# Patient Record
Sex: Female | Born: 1955
Health system: Southern US, Community
[De-identification: ages and names within clinical notes are randomized; demographics above are authoritative.]

## PROBLEM LIST (undated history)

## (undated) DIAGNOSIS — F32A Depression, unspecified: Secondary | ICD-10-CM

## (undated) DIAGNOSIS — M545 Low back pain, unspecified: Secondary | ICD-10-CM

## (undated) DIAGNOSIS — G8929 Other chronic pain: Secondary | ICD-10-CM

## (undated) DIAGNOSIS — I10 Essential (primary) hypertension: Secondary | ICD-10-CM

## (undated) DIAGNOSIS — G608 Other hereditary and idiopathic neuropathies: Secondary | ICD-10-CM

## (undated) DIAGNOSIS — G629 Polyneuropathy, unspecified: Secondary | ICD-10-CM

## (undated) DIAGNOSIS — F419 Anxiety disorder, unspecified: Secondary | ICD-10-CM

## (undated) DIAGNOSIS — J449 Chronic obstructive pulmonary disease, unspecified: Secondary | ICD-10-CM

## (undated) DIAGNOSIS — F329 Major depressive disorder, single episode, unspecified: Secondary | ICD-10-CM

## (undated) DIAGNOSIS — F4001 Agoraphobia with panic disorder: Secondary | ICD-10-CM

## (undated) HISTORY — DX: Low back pain: M54.5

## (undated) HISTORY — DX: Major depressive disorder, single episode, unspecified: F32.9

## (undated) HISTORY — DX: Depression, unspecified: F32.A

## (undated) HISTORY — DX: Polyneuropathy, unspecified: G62.9

## (undated) HISTORY — PX: OTHER SURGICAL HISTORY: SHX169

## (undated) HISTORY — DX: Anxiety disorder, unspecified: F41.9

## (undated) HISTORY — DX: Essential (primary) hypertension: I10

## (undated) HISTORY — DX: Low back pain, unspecified: M54.50

## (undated) HISTORY — DX: Agoraphobia with panic disorder: F40.01

## (undated) HISTORY — DX: Chronic obstructive pulmonary disease, unspecified: J44.9

## (undated) HISTORY — DX: Other hereditary and idiopathic neuropathies: G60.8

## (undated) HISTORY — DX: Other chronic pain: G89.29

---

## 2007-02-14 ENCOUNTER — Emergency Department: Payer: Self-pay | Admitting: Emergency Medicine

## 2007-02-14 ENCOUNTER — Other Ambulatory Visit: Payer: Self-pay

## 2009-12-04 ENCOUNTER — Emergency Department: Payer: Self-pay | Admitting: Emergency Medicine

## 2011-06-07 ENCOUNTER — Emergency Department: Payer: Self-pay | Admitting: Emergency Medicine

## 2011-06-21 DIAGNOSIS — F329 Major depressive disorder, single episode, unspecified: Secondary | ICD-10-CM | POA: Insufficient documentation

## 2011-06-21 DIAGNOSIS — J449 Chronic obstructive pulmonary disease, unspecified: Secondary | ICD-10-CM | POA: Insufficient documentation

## 2011-10-04 ENCOUNTER — Emergency Department: Payer: Self-pay | Admitting: Emergency Medicine

## 2012-10-14 ENCOUNTER — Emergency Department: Payer: Self-pay | Admitting: Internal Medicine

## 2012-10-19 LAB — MAGNESIUM: Magnesium: 1.7 mg/dL — ABNORMAL LOW

## 2012-10-19 LAB — URINALYSIS, COMPLETE
Bacteria: NONE SEEN
Bilirubin,UR: NEGATIVE
Blood: NEGATIVE
Glucose,UR: NEGATIVE mg/dL (ref 0–75)
Ketone: NEGATIVE
Leukocyte Esterase: NEGATIVE
Nitrite: NEGATIVE
Protein: NEGATIVE
RBC,UR: <1 /HPF (ref 0–5)
Specific Gravity: 1.002 (ref 1.003–1.030)
Squamous Epithelial: <1

## 2012-10-19 LAB — COMPREHENSIVE METABOLIC PANEL
Albumin: 3.7 g/dL (ref 3.4–5.0)
Alkaline Phosphatase: 110 U/L (ref 50–136)
Anion Gap: 7 (ref 7–16)
BUN: 5 mg/dL — ABNORMAL LOW (ref 7–18)
Chloride: 99 mmol/L (ref 98–107)
Co2: 28 mmol/L (ref 21–32)
EGFR (African American): >60
EGFR (Non-African Amer.): >60
Glucose: 97 mg/dL (ref 65–99)
Osmolality: 265 (ref 275–301)
SGOT(AST): 39 U/L — ABNORMAL HIGH (ref 15–37)
SGPT (ALT): 34 U/L (ref 12–78)

## 2012-10-19 LAB — TROPONIN I: Troponin-I: <0.02 ng/mL

## 2012-10-19 LAB — CK TOTAL AND CKMB (NOT AT ARMC): CK-MB: 5.2 ng/mL — ABNORMAL HIGH (ref 0.5–3.6)

## 2012-10-20 ENCOUNTER — Inpatient Hospital Stay: Payer: Self-pay | Admitting: Internal Medicine

## 2012-10-20 LAB — BASIC METABOLIC PANEL
Anion Gap: 9 (ref 7–16)
Anion Gap: 6 — ABNORMAL LOW (ref 7–16)
BUN: 3 mg/dL — ABNORMAL LOW (ref 7–18)
BUN: 7 mg/dL (ref 7–18)
Calcium, Total: 8 mg/dL — ABNORMAL LOW (ref 8.5–10.1)
Chloride: 105 mmol/L (ref 98–107)
Chloride: 104 mmol/L (ref 98–107)
Co2: 26 mmol/L (ref 21–32)
Creatinine: 0.76 mg/dL (ref 0.60–1.30)
Creatinine: 0.9 mg/dL (ref 0.60–1.30)
EGFR (African American): >60
EGFR (Non-African Amer.): >60
Glucose: 93 mg/dL (ref 65–99)
Glucose: 114 mg/dL — ABNORMAL HIGH (ref 65–99)
Glucose: 114 mg/dL — ABNORMAL HIGH (ref 65–99)
Osmolality: 273 (ref 275–301)
Osmolality: 273 (ref 275–301)
Potassium: 2.5 mmol/L — CL (ref 3.5–5.1)
Potassium: 3 mmol/L — ABNORMAL LOW (ref 3.5–5.1)
Potassium: 3.6 mmol/L (ref 3.5–5.1)
Sodium: 138 mmol/L (ref 136–145)

## 2012-10-20 LAB — MAGNESIUM: Magnesium: 2.3 mg/dL

## 2012-10-20 LAB — CBC WITH DIFFERENTIAL/PLATELET
Eosinophil #: 0.2 10*3/uL (ref 0.0–0.7)
HGB: 11.6 g/dL — ABNORMAL LOW (ref 12.0–16.0)
Lymphocyte %: 43.1 %
MCHC: 35.8 g/dL (ref 32.0–36.0)
MCV: 100 fL (ref 80–100)
Monocyte #: 0.8 x10 3/mm (ref 0.2–0.9)
Monocyte %: 10.3 %
Neutrophil #: 3.3 10*3/uL (ref 1.4–6.5)
Platelet: 384 10*3/uL (ref 150–440)
RBC: 3.25 10*6/uL — ABNORMAL LOW (ref 3.80–5.20)
WBC: 7.5 10*3/uL (ref 3.6–11.0)

## 2012-10-20 LAB — CK-MB
CK-MB: 2.7 ng/mL (ref 0.5–3.6)
CK-MB: 2.1 ng/mL (ref 0.5–3.6)

## 2012-10-20 LAB — TROPONIN I: Troponin-I: <0.02 ng/mL

## 2012-10-22 ENCOUNTER — Emergency Department: Payer: Self-pay | Admitting: Emergency Medicine

## 2012-10-22 LAB — BASIC METABOLIC PANEL
Anion Gap: 9 (ref 7–16)
BUN: 3 mg/dL — ABNORMAL LOW (ref 7–18)
Calcium, Total: 8.6 mg/dL (ref 8.5–10.1)
Chloride: 102 mmol/L (ref 98–107)
Creatinine: 0.7 mg/dL (ref 0.60–1.30)
EGFR (African American): >60
EGFR (Non-African Amer.): >60
Glucose: 94 mg/dL (ref 65–99)
Osmolality: 266 (ref 275–301)
Sodium: 135 mmol/L — ABNORMAL LOW (ref 136–145)

## 2012-10-22 LAB — CBC
HCT: 29.8 % — ABNORMAL LOW (ref 35.0–47.0)
MCH: 36.2 pg — ABNORMAL HIGH (ref 26.0–34.0)
MCV: 103 fL — ABNORMAL HIGH (ref 80–100)
Platelet: 302 10*3/uL (ref 150–440)
RDW: 15 % — ABNORMAL HIGH (ref 11.5–14.5)
WBC: 8.3 10*3/uL (ref 3.6–11.0)

## 2012-11-21 LAB — HM COLONOSCOPY

## 2012-11-21 LAB — HM MAMMOGRAPHY

## 2012-11-21 LAB — HM PAP SMEAR

## 2012-12-11 LAB — BASIC METABOLIC PANEL
Anion Gap: 7 (ref 7–16)
Calcium, Total: 8.4 mg/dL — ABNORMAL LOW (ref 8.5–10.1)
Chloride: 93 mmol/L — ABNORMAL LOW (ref 98–107)
Glucose: 100 mg/dL — ABNORMAL HIGH (ref 65–99)
Potassium: 2.5 mmol/L — CL (ref 3.5–5.1)

## 2012-12-11 LAB — MAGNESIUM: Magnesium: 1.3 mg/dL — ABNORMAL LOW

## 2012-12-11 LAB — CBC
MCH: 34.7 pg — ABNORMAL HIGH (ref 26.0–34.0)
MCHC: 36.3 g/dL — ABNORMAL HIGH (ref 32.0–36.0)
Platelet: 338 10*3/uL (ref 150–440)
RBC: 3.2 10*6/uL — ABNORMAL LOW (ref 3.80–5.20)
RDW: 13.1 % (ref 11.5–14.5)

## 2012-12-12 ENCOUNTER — Observation Stay: Payer: Self-pay | Admitting: Internal Medicine

## 2012-12-12 LAB — SODIUM: Sodium: 134 mmol/L — ABNORMAL LOW (ref 136–145)

## 2012-12-12 LAB — POTASSIUM: Potassium: 3.9 mmol/L (ref 3.5–5.1)

## 2012-12-12 LAB — MAGNESIUM: Magnesium: 2.5 mg/dL — ABNORMAL HIGH

## 2013-02-19 ENCOUNTER — Emergency Department: Payer: Self-pay | Admitting: Emergency Medicine

## 2013-03-11 LAB — HEMOGLOBIN A1C: Hgb A1c MFr Bld: 5.8 % (ref 4.0–6.0)

## 2013-06-03 ENCOUNTER — Emergency Department: Payer: Self-pay | Admitting: Emergency Medicine

## 2013-06-03 LAB — URINALYSIS, COMPLETE
BACTERIA: NONE SEEN
BILIRUBIN, UR: NEGATIVE
Glucose,UR: NEGATIVE mg/dL (ref 0–75)
Ketone: NEGATIVE
Leukocyte Esterase: NEGATIVE
NITRITE: NEGATIVE
Ph: 7 (ref 4.5–8.0)
Protein: NEGATIVE
RBC,UR: NONE SEEN /HPF (ref 0–5)
Specific Gravity: 1.003 (ref 1.003–1.030)
Squamous Epithelial: <1

## 2013-10-19 ENCOUNTER — Emergency Department: Payer: Self-pay | Admitting: Internal Medicine

## 2013-10-20 ENCOUNTER — Emergency Department: Payer: Self-pay | Admitting: Emergency Medicine

## 2013-11-18 ENCOUNTER — Ambulatory Visit: Payer: Self-pay | Admitting: Family Medicine

## 2013-12-10 LAB — LIPID PANEL
CHOLESTEROL: 253 mg/dL — AB (ref 0–200)
HDL: 62 mg/dL (ref 35–70)
Triglycerides: 147 mg/dL (ref 40–160)

## 2013-12-17 DIAGNOSIS — M545 Low back pain, unspecified: Secondary | ICD-10-CM | POA: Insufficient documentation

## 2013-12-17 DIAGNOSIS — R200 Anesthesia of skin: Secondary | ICD-10-CM | POA: Insufficient documentation

## 2013-12-17 DIAGNOSIS — G8929 Other chronic pain: Secondary | ICD-10-CM | POA: Insufficient documentation

## 2013-12-17 DIAGNOSIS — R202 Paresthesia of skin: Secondary | ICD-10-CM

## 2013-12-17 DIAGNOSIS — M79673 Pain in unspecified foot: Secondary | ICD-10-CM | POA: Insufficient documentation

## 2013-12-17 DIAGNOSIS — R208 Other disturbances of skin sensation: Secondary | ICD-10-CM | POA: Insufficient documentation

## 2013-12-30 LAB — LIPID PANEL: LDL Cholesterol: 162 mg/dL

## 2014-01-06 ENCOUNTER — Ambulatory Visit: Payer: Self-pay | Admitting: Neurology

## 2014-01-20 DIAGNOSIS — F4001 Agoraphobia with panic disorder: Secondary | ICD-10-CM | POA: Diagnosis not present

## 2014-01-20 DIAGNOSIS — E871 Hypo-osmolality and hyponatremia: Secondary | ICD-10-CM | POA: Diagnosis not present

## 2014-01-20 DIAGNOSIS — M479 Spondylosis, unspecified: Secondary | ICD-10-CM | POA: Diagnosis not present

## 2014-01-20 DIAGNOSIS — E876 Hypokalemia: Secondary | ICD-10-CM | POA: Diagnosis not present

## 2014-01-30 DIAGNOSIS — F17200 Nicotine dependence, unspecified, uncomplicated: Secondary | ICD-10-CM | POA: Diagnosis not present

## 2014-01-30 DIAGNOSIS — H7202 Central perforation of tympanic membrane, left ear: Secondary | ICD-10-CM | POA: Diagnosis not present

## 2014-01-30 DIAGNOSIS — H902 Conductive hearing loss, unspecified: Secondary | ICD-10-CM | POA: Diagnosis not present

## 2014-02-07 ENCOUNTER — Emergency Department: Payer: Self-pay | Admitting: Emergency Medicine

## 2014-02-07 DIAGNOSIS — G5792 Unspecified mononeuropathy of left lower limb: Secondary | ICD-10-CM | POA: Diagnosis not present

## 2014-02-07 DIAGNOSIS — I6789 Other cerebrovascular disease: Secondary | ICD-10-CM | POA: Diagnosis not present

## 2014-02-07 DIAGNOSIS — R200 Anesthesia of skin: Secondary | ICD-10-CM | POA: Diagnosis not present

## 2014-02-07 DIAGNOSIS — Z720 Tobacco use: Secondary | ICD-10-CM | POA: Diagnosis not present

## 2014-02-07 DIAGNOSIS — F419 Anxiety disorder, unspecified: Secondary | ICD-10-CM | POA: Diagnosis not present

## 2014-02-07 DIAGNOSIS — Z880 Allergy status to penicillin: Secondary | ICD-10-CM | POA: Diagnosis not present

## 2014-02-07 DIAGNOSIS — R202 Paresthesia of skin: Secondary | ICD-10-CM | POA: Diagnosis not present

## 2014-02-07 DIAGNOSIS — R209 Unspecified disturbances of skin sensation: Secondary | ICD-10-CM | POA: Diagnosis not present

## 2014-02-08 ENCOUNTER — Emergency Department: Payer: Self-pay | Admitting: Emergency Medicine

## 2014-02-08 DIAGNOSIS — Z720 Tobacco use: Secondary | ICD-10-CM | POA: Diagnosis not present

## 2014-02-08 DIAGNOSIS — R55 Syncope and collapse: Secondary | ICD-10-CM | POA: Diagnosis not present

## 2014-02-08 DIAGNOSIS — Z880 Allergy status to penicillin: Secondary | ICD-10-CM | POA: Diagnosis not present

## 2014-02-08 DIAGNOSIS — R1110 Vomiting, unspecified: Secondary | ICD-10-CM | POA: Diagnosis not present

## 2014-02-08 DIAGNOSIS — R110 Nausea: Secondary | ICD-10-CM | POA: Diagnosis not present

## 2014-02-08 DIAGNOSIS — F419 Anxiety disorder, unspecified: Secondary | ICD-10-CM | POA: Diagnosis not present

## 2014-02-08 DIAGNOSIS — R112 Nausea with vomiting, unspecified: Secondary | ICD-10-CM | POA: Diagnosis not present

## 2014-02-08 LAB — URINALYSIS, COMPLETE
BACTERIA: NONE SEEN
BILIRUBIN, UR: NEGATIVE
BLOOD: NEGATIVE
GLUCOSE, UR: NEGATIVE mg/dL (ref 0–75)
KETONE: NEGATIVE
Leukocyte Esterase: NEGATIVE
Nitrite: NEGATIVE
Ph: 7 (ref 4.5–8.0)
Protein: NEGATIVE
RBC,UR: 1 /HPF (ref 0–5)
SPECIFIC GRAVITY: 1.002 (ref 1.003–1.030)
WBC UR: 2 /HPF (ref 0–5)

## 2014-02-08 LAB — CBC
HCT: 36.7 % (ref 35.0–47.0)
HGB: 12.9 g/dL (ref 12.0–16.0)
MCH: 32.2 pg (ref 26.0–34.0)
MCHC: 35.1 g/dL (ref 32.0–36.0)
MCV: 92 fL (ref 80–100)
PLATELETS: 398 10*3/uL (ref 150–440)
RBC: 4 10*6/uL (ref 3.80–5.20)
RDW: 12.7 % (ref 11.5–14.5)
WBC: 10.8 10*3/uL (ref 3.6–11.0)

## 2014-02-08 LAB — COMPREHENSIVE METABOLIC PANEL
Albumin: 3.7 g/dL (ref 3.4–5.0)
Alkaline Phosphatase: 134 U/L — ABNORMAL HIGH (ref 46–116)
Anion Gap: 7 (ref 7–16)
BILIRUBIN TOTAL: 0.3 mg/dL (ref 0.2–1.0)
BUN: 3 mg/dL — ABNORMAL LOW (ref 7–18)
CHLORIDE: 103 mmol/L (ref 98–107)
CO2: 26 mmol/L (ref 21–32)
CREATININE: 0.78 mg/dL (ref 0.60–1.30)
Calcium, Total: 8.9 mg/dL (ref 8.5–10.1)
Glucose: 79 mg/dL (ref 65–99)
Osmolality: 267 (ref 275–301)
Potassium: 3.7 mmol/L (ref 3.5–5.1)
SGOT(AST): 23 U/L (ref 15–37)
SGPT (ALT): 20 U/L (ref 14–63)
Sodium: 136 mmol/L (ref 136–145)
Total Protein: 7.6 g/dL (ref 6.4–8.2)

## 2014-02-08 LAB — CK TOTAL AND CKMB (NOT AT ARMC)
CK, TOTAL: 121 U/L (ref 26–192)
CK-MB: 1 ng/mL (ref 0.5–3.6)

## 2014-02-08 LAB — TROPONIN I

## 2014-02-08 LAB — DRUG SCREEN, URINE
AMPHETAMINES, UR SCREEN: NEGATIVE (ref ?–1000)
BENZODIAZEPINE, UR SCRN: POSITIVE (ref ?–200)
Barbiturates, Ur Screen: NEGATIVE (ref ?–200)
Cannabinoid 50 Ng, Ur ~~LOC~~: NEGATIVE (ref ?–50)
Cocaine Metabolite,Ur ~~LOC~~: NEGATIVE (ref ?–300)
MDMA (ECSTASY) UR SCREEN: NEGATIVE (ref ?–500)
Methadone, Ur Screen: NEGATIVE (ref ?–300)
OPIATE, UR SCREEN: POSITIVE (ref ?–300)
PHENCYCLIDINE (PCP) UR S: NEGATIVE (ref ?–25)
Tricyclic, Ur Screen: NEGATIVE (ref ?–1000)

## 2014-02-08 LAB — TSH: Thyroid Stimulating Horm: 1.61 u[IU]/mL

## 2014-02-12 DIAGNOSIS — F4001 Agoraphobia with panic disorder: Secondary | ICD-10-CM | POA: Diagnosis not present

## 2014-02-18 DIAGNOSIS — G629 Polyneuropathy, unspecified: Secondary | ICD-10-CM | POA: Diagnosis not present

## 2014-02-18 DIAGNOSIS — F4001 Agoraphobia with panic disorder: Secondary | ICD-10-CM | POA: Diagnosis not present

## 2014-02-18 DIAGNOSIS — M5410 Radiculopathy, site unspecified: Secondary | ICD-10-CM | POA: Diagnosis not present

## 2014-02-18 DIAGNOSIS — G8929 Other chronic pain: Secondary | ICD-10-CM | POA: Diagnosis not present

## 2014-03-18 DIAGNOSIS — M5410 Radiculopathy, site unspecified: Secondary | ICD-10-CM | POA: Diagnosis not present

## 2014-03-18 DIAGNOSIS — F4001 Agoraphobia with panic disorder: Secondary | ICD-10-CM | POA: Diagnosis not present

## 2014-03-18 DIAGNOSIS — G629 Polyneuropathy, unspecified: Secondary | ICD-10-CM | POA: Diagnosis not present

## 2014-03-18 DIAGNOSIS — G8929 Other chronic pain: Secondary | ICD-10-CM | POA: Diagnosis not present

## 2014-03-18 DIAGNOSIS — G4701 Insomnia due to medical condition: Secondary | ICD-10-CM | POA: Diagnosis not present

## 2014-03-22 ENCOUNTER — Emergency Department: Payer: Self-pay | Admitting: Emergency Medicine

## 2014-03-22 DIAGNOSIS — R202 Paresthesia of skin: Secondary | ICD-10-CM | POA: Diagnosis not present

## 2014-03-22 DIAGNOSIS — R0602 Shortness of breath: Secondary | ICD-10-CM | POA: Diagnosis not present

## 2014-03-22 DIAGNOSIS — Z720 Tobacco use: Secondary | ICD-10-CM | POA: Diagnosis not present

## 2014-03-22 DIAGNOSIS — R079 Chest pain, unspecified: Secondary | ICD-10-CM | POA: Diagnosis not present

## 2014-03-22 DIAGNOSIS — Z880 Allergy status to penicillin: Secondary | ICD-10-CM | POA: Diagnosis not present

## 2014-03-22 DIAGNOSIS — R209 Unspecified disturbances of skin sensation: Secondary | ICD-10-CM | POA: Diagnosis not present

## 2014-03-22 DIAGNOSIS — F419 Anxiety disorder, unspecified: Secondary | ICD-10-CM | POA: Diagnosis not present

## 2014-03-22 DIAGNOSIS — I10 Essential (primary) hypertension: Secondary | ICD-10-CM | POA: Diagnosis not present

## 2014-03-22 DIAGNOSIS — M25473 Effusion, unspecified ankle: Secondary | ICD-10-CM | POA: Diagnosis not present

## 2014-03-22 DIAGNOSIS — R05 Cough: Secondary | ICD-10-CM | POA: Diagnosis not present

## 2014-03-22 DIAGNOSIS — M549 Dorsalgia, unspecified: Secondary | ICD-10-CM | POA: Diagnosis not present

## 2014-03-22 DIAGNOSIS — G629 Polyneuropathy, unspecified: Secondary | ICD-10-CM | POA: Diagnosis not present

## 2014-03-23 ENCOUNTER — Emergency Department: Payer: Self-pay | Admitting: Internal Medicine

## 2014-03-23 DIAGNOSIS — R209 Unspecified disturbances of skin sensation: Secondary | ICD-10-CM | POA: Diagnosis not present

## 2014-03-28 DIAGNOSIS — M5410 Radiculopathy, site unspecified: Secondary | ICD-10-CM | POA: Diagnosis not present

## 2014-03-28 DIAGNOSIS — G8929 Other chronic pain: Secondary | ICD-10-CM | POA: Diagnosis not present

## 2014-04-05 ENCOUNTER — Emergency Department: Payer: Self-pay | Admitting: Emergency Medicine

## 2014-04-05 DIAGNOSIS — Z720 Tobacco use: Secondary | ICD-10-CM | POA: Diagnosis not present

## 2014-04-05 DIAGNOSIS — Z880 Allergy status to penicillin: Secondary | ICD-10-CM | POA: Diagnosis not present

## 2014-04-05 DIAGNOSIS — R062 Wheezing: Secondary | ICD-10-CM | POA: Diagnosis not present

## 2014-04-05 DIAGNOSIS — F23 Brief psychotic disorder: Secondary | ICD-10-CM | POA: Diagnosis not present

## 2014-04-05 DIAGNOSIS — F29 Unspecified psychosis not due to a substance or known physiological condition: Secondary | ICD-10-CM | POA: Diagnosis not present

## 2014-04-05 DIAGNOSIS — F329 Major depressive disorder, single episode, unspecified: Secondary | ICD-10-CM | POA: Diagnosis not present

## 2014-04-05 DIAGNOSIS — F419 Anxiety disorder, unspecified: Secondary | ICD-10-CM | POA: Diagnosis not present

## 2014-04-05 LAB — ETHANOL: Ethanol: <5 mg/dL

## 2014-04-05 LAB — URINALYSIS, COMPLETE
BILIRUBIN, UR: NEGATIVE
Blood: NEGATIVE
GLUCOSE, UR: NEGATIVE mg/dL (ref 0–75)
Ketone: NEGATIVE
Nitrite: NEGATIVE
Ph: 7 (ref 4.5–8.0)
Protein: NEGATIVE
RBC,UR: 1 /HPF (ref 0–5)
SPECIFIC GRAVITY: 1.004 (ref 1.003–1.030)
Squamous Epithelial: <1

## 2014-04-05 LAB — COMPREHENSIVE METABOLIC PANEL
ALT: 16 U/L
AST: 19 U/L
Albumin: 4.7 g/dL
Alkaline Phosphatase: 132 U/L — ABNORMAL HIGH
Anion Gap: 8 (ref 7–16)
BUN: <5 mg/dL
Bilirubin,Total: 0.3 mg/dL
CALCIUM: 9.1 mg/dL
CO2: 28 mmol/L
Chloride: 97 mmol/L — ABNORMAL LOW
Creatinine: 0.57 mg/dL
EGFR (African American): >60
EGFR (Non-African Amer.): >60
Glucose: 85 mg/dL
Potassium: 3.1 mmol/L — ABNORMAL LOW
Sodium: 133 mmol/L — ABNORMAL LOW
Total Protein: 8.2 g/dL — ABNORMAL HIGH

## 2014-04-05 LAB — CBC
HCT: 41.9 % (ref 35.0–47.0)
HGB: 14.2 g/dL (ref 12.0–16.0)
MCH: 31 pg (ref 26.0–34.0)
MCHC: 33.9 g/dL (ref 32.0–36.0)
MCV: 92 fL (ref 80–100)
Platelet: 415 10*3/uL (ref 150–440)
RBC: 4.58 10*6/uL (ref 3.80–5.20)
RDW: 13.2 % (ref 11.5–14.5)
WBC: 14.4 10*3/uL — AB (ref 3.6–11.0)

## 2014-04-05 LAB — DRUG SCREEN, URINE
Amphetamines, Ur Screen: NEGATIVE
BARBITURATES, UR SCREEN: NEGATIVE
BENZODIAZEPINE, UR SCRN: POSITIVE
CANNABINOID 50 NG, UR ~~LOC~~: NEGATIVE
Cocaine Metabolite,Ur ~~LOC~~: NEGATIVE
MDMA (ECSTASY) UR SCREEN: NEGATIVE
METHADONE, UR SCREEN: NEGATIVE
Opiate, Ur Screen: POSITIVE
PHENCYCLIDINE (PCP) UR S: NEGATIVE
Tricyclic, Ur Screen: NEGATIVE

## 2014-04-11 ENCOUNTER — Inpatient Hospital Stay
Admit: 2014-04-11 | Disposition: A | Discharge status: Home / Self Care | Payer: Self-pay | Attending: Internal Medicine | Admitting: Internal Medicine

## 2014-04-11 DIAGNOSIS — M1990 Unspecified osteoarthritis, unspecified site: Secondary | ICD-10-CM | POA: Diagnosis not present

## 2014-04-11 DIAGNOSIS — G629 Polyneuropathy, unspecified: Secondary | ICD-10-CM | POA: Diagnosis not present

## 2014-04-11 DIAGNOSIS — R4189 Other symptoms and signs involving cognitive functions and awareness: Secondary | ICD-10-CM | POA: Diagnosis not present

## 2014-04-11 DIAGNOSIS — A419 Sepsis, unspecified organism: Secondary | ICD-10-CM | POA: Diagnosis not present

## 2014-04-11 DIAGNOSIS — J432 Centrilobular emphysema: Secondary | ICD-10-CM | POA: Diagnosis not present

## 2014-04-11 DIAGNOSIS — J449 Chronic obstructive pulmonary disease, unspecified: Secondary | ICD-10-CM | POA: Diagnosis not present

## 2014-04-11 DIAGNOSIS — Z880 Allergy status to penicillin: Secondary | ICD-10-CM | POA: Diagnosis not present

## 2014-04-11 DIAGNOSIS — R4182 Altered mental status, unspecified: Secondary | ICD-10-CM | POA: Diagnosis not present

## 2014-04-11 DIAGNOSIS — I10 Essential (primary) hypertension: Secondary | ICD-10-CM | POA: Diagnosis not present

## 2014-04-11 DIAGNOSIS — T542X2A Toxic effect of corrosive acids and acid-like substances, intentional self-harm, initial encounter: Secondary | ICD-10-CM | POA: Diagnosis not present

## 2014-04-11 DIAGNOSIS — F410 Panic disorder [episodic paroxysmal anxiety] without agoraphobia: Secondary | ICD-10-CM | POA: Diagnosis not present

## 2014-04-11 DIAGNOSIS — F339 Major depressive disorder, recurrent, unspecified: Secondary | ICD-10-CM | POA: Diagnosis not present

## 2014-04-11 DIAGNOSIS — G8929 Other chronic pain: Secondary | ICD-10-CM | POA: Diagnosis not present

## 2014-04-11 DIAGNOSIS — R4020 Unspecified coma: Secondary | ICD-10-CM | POA: Diagnosis not present

## 2014-04-11 DIAGNOSIS — R6521 Severe sepsis with septic shock: Secondary | ICD-10-CM | POA: Diagnosis not present

## 2014-04-11 DIAGNOSIS — F319 Bipolar disorder, unspecified: Secondary | ICD-10-CM | POA: Diagnosis not present

## 2014-04-11 DIAGNOSIS — T424X2A Poisoning by benzodiazepines, intentional self-harm, initial encounter: Secondary | ICD-10-CM | POA: Diagnosis not present

## 2014-04-11 DIAGNOSIS — G9341 Metabolic encephalopathy: Secondary | ICD-10-CM | POA: Diagnosis not present

## 2014-04-11 DIAGNOSIS — F209 Schizophrenia, unspecified: Secondary | ICD-10-CM | POA: Diagnosis not present

## 2014-04-11 DIAGNOSIS — I739 Peripheral vascular disease, unspecified: Secondary | ICD-10-CM | POA: Diagnosis not present

## 2014-04-11 DIAGNOSIS — Z79899 Other long term (current) drug therapy: Secondary | ICD-10-CM | POA: Diagnosis not present

## 2014-04-11 DIAGNOSIS — M549 Dorsalgia, unspecified: Secondary | ICD-10-CM | POA: Diagnosis not present

## 2014-04-11 DIAGNOSIS — F329 Major depressive disorder, single episode, unspecified: Secondary | ICD-10-CM | POA: Diagnosis not present

## 2014-04-11 DIAGNOSIS — F4000 Agoraphobia, unspecified: Secondary | ICD-10-CM | POA: Diagnosis not present

## 2014-04-11 DIAGNOSIS — R000 Tachycardia, unspecified: Secondary | ICD-10-CM | POA: Diagnosis not present

## 2014-04-11 DIAGNOSIS — T50904A Poisoning by unspecified drugs, medicaments and biological substances, undetermined, initial encounter: Secondary | ICD-10-CM | POA: Diagnosis not present

## 2014-04-11 DIAGNOSIS — K219 Gastro-esophageal reflux disease without esophagitis: Secondary | ICD-10-CM | POA: Diagnosis not present

## 2014-04-11 DIAGNOSIS — I959 Hypotension, unspecified: Secondary | ICD-10-CM | POA: Diagnosis not present

## 2014-04-11 LAB — DRUG SCREEN, URINE
Amphetamines, Ur Screen: NEGATIVE
BARBITURATES, UR SCREEN: NEGATIVE
BENZODIAZEPINE, UR SCRN: POSITIVE
Cannabinoid 50 Ng, Ur ~~LOC~~: NEGATIVE
Cocaine Metabolite,Ur ~~LOC~~: NEGATIVE
MDMA (Ecstasy)Ur Screen: NEGATIVE
Methadone, Ur Screen: NEGATIVE
Opiate, Ur Screen: POSITIVE
PHENCYCLIDINE (PCP) UR S: NEGATIVE
Tricyclic, Ur Screen: POSITIVE

## 2014-04-11 LAB — ETHANOL
Ethanol: <5 mg/dL
Ethanol: <5 mg/dL

## 2014-04-11 LAB — CBC
HCT: 43.3 % (ref 35.0–47.0)
HGB: 14.2 g/dL (ref 12.0–16.0)
MCH: 29.7 pg (ref 26.0–34.0)
MCHC: 32.8 g/dL (ref 32.0–36.0)
MCV: 90 fL (ref 80–100)
Platelet: 412 10*3/uL (ref 150–440)
RBC: 4.79 10*6/uL (ref 3.80–5.20)
RDW: 13 % (ref 11.5–14.5)
WBC: 12 10*3/uL — AB (ref 3.6–11.0)

## 2014-04-11 LAB — COMPREHENSIVE METABOLIC PANEL
ALBUMIN: 4.6 g/dL
ALT: 18 U/L
Alkaline Phosphatase: 118 U/L
Anion Gap: 9 (ref 7–16)
BILIRUBIN TOTAL: 0.7 mg/dL
BUN: 8 mg/dL
CHLORIDE: 105 mmol/L
CREATININE: 0.74 mg/dL
Calcium, Total: 9.2 mg/dL
Co2: 24 mmol/L
EGFR (African American): >60
EGFR (Non-African Amer.): >60
GLUCOSE: 145 mg/dL — AB
POTASSIUM: 3.5 mmol/L
SGOT(AST): 22 U/L
SODIUM: 138 mmol/L
Total Protein: 7.8 g/dL

## 2014-04-11 LAB — CK-MB
CK-MB: 2.3 ng/mL
CK-MB: 2.5 ng/mL

## 2014-04-11 LAB — URINALYSIS, COMPLETE
BILIRUBIN, UR: NEGATIVE
Bacteria: NONE SEEN
Blood: NEGATIVE
Glucose,UR: NEGATIVE mg/dL (ref 0–75)
Ketone: NEGATIVE
Leukocyte Esterase: NEGATIVE
Nitrite: NEGATIVE
Ph: 7 (ref 4.5–8.0)
Protein: NEGATIVE
Specific Gravity: 1.01 (ref 1.003–1.030)
WBC UR: 1 /HPF (ref 0–5)

## 2014-04-11 LAB — LACTIC ACID, PLASMA
Lactic Acid, Venous: 2.4 mmol/L
Lactic Acid, Venous: 0.9 mmol/L

## 2014-04-11 LAB — TROPONIN I
Troponin-I: <0.03 ng/mL
Troponin-I: <0.03 ng/mL

## 2014-04-11 LAB — AMMONIA: AMMONIA, PLASMA: 51 umol/L — AB

## 2014-04-11 LAB — PROTIME-INR
INR: 0.9
PROTHROMBIN TIME: 12.5 s

## 2014-04-11 LAB — ACETAMINOPHEN LEVEL: Acetaminophen: <10 ug/mL

## 2014-04-11 LAB — SALICYLATE LEVEL: Salicylates, Serum: <4 mg/dL

## 2014-04-12 LAB — BASIC METABOLIC PANEL
ANION GAP: 3 — AB (ref 7–16)
BUN: <5 mg/dL
CALCIUM: 7.6 mg/dL — AB
CHLORIDE: 109 mmol/L
Co2: 24 mmol/L
Creatinine: 0.52 mg/dL
EGFR (Non-African Amer.): >60
GLUCOSE: 84 mg/dL
Potassium: 3.4 mmol/L — ABNORMAL LOW
SODIUM: 136 mmol/L

## 2014-04-12 LAB — COMPREHENSIVE METABOLIC PANEL
Albumin: 3 g/dL — ABNORMAL LOW
Alkaline Phosphatase: 79 U/L
Anion Gap: 6 — ABNORMAL LOW (ref 7–16)
Bilirubin,Total: 0.5 mg/dL
Calcium, Total: 7.8 mg/dL — ABNORMAL LOW
Chloride: 108 mmol/L
Co2: 22 mmol/L
Creatinine: 0.54 mg/dL
EGFR (African American): >60
Glucose: 105 mg/dL — ABNORMAL HIGH
Potassium: 3.4 mmol/L — ABNORMAL LOW
SGOT(AST): 24 U/L
SGPT (ALT): 12 U/L — ABNORMAL LOW
Sodium: 136 mmol/L
Total Protein: 5.6 g/dL — ABNORMAL LOW

## 2014-04-12 LAB — TSH: Thyroid Stimulating Horm: 0.307 u[IU]/mL — ABNORMAL LOW

## 2014-04-12 LAB — CBC WITH DIFFERENTIAL/PLATELET
BASOS ABS: 0.1 10*3/uL (ref 0.0–0.1)
Basophil %: 0.6 %
Eosinophil #: 0 10*3/uL (ref 0.0–0.7)
Eosinophil %: 0.4 %
HCT: 33.8 % — ABNORMAL LOW (ref 35.0–47.0)
HGB: 11.3 g/dL — ABNORMAL LOW (ref 12.0–16.0)
LYMPHS ABS: 3.2 10*3/uL (ref 1.0–3.6)
Lymphocyte %: 31.2 %
MCH: 30.4 pg (ref 26.0–34.0)
MCHC: 33.3 g/dL (ref 32.0–36.0)
MCV: 91 fL (ref 80–100)
Monocyte #: 0.5 x10 3/mm (ref 0.2–0.9)
Monocyte %: 5.2 %
NEUTROS ABS: 6.3 10*3/uL (ref 1.4–6.5)
Neutrophil %: 62.6 %
Platelet: 288 10*3/uL (ref 150–440)
RBC: 3.71 10*6/uL — AB (ref 3.80–5.20)
RDW: 13.3 % (ref 11.5–14.5)
WBC: 10.1 10*3/uL (ref 3.6–11.0)

## 2014-04-12 LAB — CK-MB: CK-MB: 3.6 ng/mL

## 2014-04-12 LAB — D-DIMER(ARMC): D-Dimer: 492 ng/ml

## 2014-04-12 LAB — MAGNESIUM: Magnesium: 1.4 mg/dL — ABNORMAL LOW

## 2014-04-13 LAB — URINE CULTURE

## 2014-04-14 LAB — TSH: THYROID STIMULATING HORM: 0.696 u[IU]/mL

## 2014-04-16 LAB — CULTURE, BLOOD (SINGLE)

## 2014-04-17 DIAGNOSIS — E876 Hypokalemia: Secondary | ICD-10-CM | POA: Diagnosis not present

## 2014-04-17 DIAGNOSIS — G8929 Other chronic pain: Secondary | ICD-10-CM | POA: Diagnosis not present

## 2014-04-17 DIAGNOSIS — M5416 Radiculopathy, lumbar region: Secondary | ICD-10-CM | POA: Diagnosis not present

## 2014-04-17 DIAGNOSIS — F4001 Agoraphobia with panic disorder: Secondary | ICD-10-CM | POA: Diagnosis not present

## 2014-04-17 DIAGNOSIS — I10 Essential (primary) hypertension: Secondary | ICD-10-CM | POA: Diagnosis not present

## 2014-04-17 DIAGNOSIS — E7220 Disorder of urea cycle metabolism, unspecified: Secondary | ICD-10-CM | POA: Diagnosis not present

## 2014-04-17 DIAGNOSIS — I619 Nontraumatic intracerebral hemorrhage, unspecified: Secondary | ICD-10-CM | POA: Diagnosis not present

## 2014-04-18 LAB — BASIC METABOLIC PANEL
CREATININE: 0.6 mg/dL (ref <=1.1)
Potassium: 4.2 mmol/L (ref 3.4–5.3)

## 2014-05-02 NOTE — H&P (Signed)
PATIENT NAME:  Tamara Mcclure, Tamara Mcclure MR#:  [id number] DATE OF BIRTH:  [birth year]  DATE OF ADMISSION:  10/20/2012  REFERRING PHYSICIAN: Lavonia Drafts, MD   PRIMARY CARE PHYSICIAN: Dr Rockne Coons  CHIEF COMPLAINT: Leg swelling.  HISTORY OF PRESENT ILLNESS: This is a 59 year old female with significant past medical history of GERD, hypertension, COPD, and multiple psychiatric history including panic attacks, anxiety and agoraphobia, who presents with complaints of lower extremity edema. The patient reports her lower extremity edema had been there for a few weeks, had been worsening over the last few days. She denies any shortness of breath, any chest pain. The patient denies any leg pain, erythema, or tenderness. Denies any hemoptysis, any chest pain, any recent travel. The patient had basic labs done, which did show significant hypokalemia and hypomagnesemia.  Potassium was at 2.2. The patient reports she has been having chronic diarrhea, where she has been having a loose bowel movement every other day (chronically has been going on for years), but denies any abdominal pain, fever or chills. The patient's EKG was done and she was noticed to have prolonged QTc at 603 ms. Hospitalist service was requested to admit the patient for further correction of her electrolytes and monitoring for her prolonged QTc. As well, the patient was noticed to be on Seroquel, which she mentioned this is mainly for sleep disturbances, where she takes it at night.   PAST MEDICAL HISTORY:  1.  Gastroesophageal reflux disease.  2.  Hypertension.  3.  COPD. 4.  Psychiatric history, including anxiety, panic attacks, agoraphobia.   PAST SURGICAL HISTORY: None.   FAMILY HISTORY: Denies any coronary artery disease at young age.   SOCIAL HISTORY: The patient is on disability due to her psychiatric problems. Smokes currently half a pack per day, used to smoke 1 pack, recently decreased to half pack.  Denies any alcohol, any illicit drug  use.   ALLERGIES: PENICILLIN.   HOME MEDICATIONS:  1.  Soma 350 mg 3 times a day.  2.  Seroquel 100 mg 3 times a day.  3.  Combivent 1 puff 4 times a day as needed.  4.  Norvasc 10 mg oral daily.  5.  Alprazolam 10 mg 4 times a day.  6.  Advair Diskus 250/50 mcg inhalational 2 times a day.  7.  Acetaminophen/tramadol 325 mg/37.5 mg oral 2 tablets every 6 hours.   REVIEW OF SYSTEMS:  CONSTITUTIONAL: The patient denies fever, chills, fatigue, weakness, weight gain, weight loss.  EYES: Denies blurry vision, double vision, inflammation, glaucoma.  ENT: Denies tinnitus, ear pain, hearing loss, epistaxis or discharge.  RESPIRATORY: Denies cough, wheezing, hemoptysis, dyspnea. Complains of COPD. CARDIOVASCULAR: Denies chest pain, arrhythmia, palpitations, syncope. Complains of chronic lower extremity edema.  GASTROINTESTINAL: Denies nausea, vomiting, abdominal pain, hematemesis, melena, coffee-ground emesis, rectal bleed, constipation. The patient reports she has been having mild diarrhea, chronically.  GENITOURINARY: Denies dysuria, hematuria, renal colic.  ENDOCRINE: Denies polyuria, polydipsia, heat or cold intolerance.  HEMATOLOGY: Denies anemia, easy bruising, bleeding diathesis.  INTEGUMENTARY: Denies acne, rash or skin lesions.  MUSCULOSKELETAL: Denies any gout or cramps. Reports lower extremity swelling and edema.  NEUROLOGIC: Denies CVA, TIA, ataxia, dementia, headache or tremor.  PSYCHIATRIC: Reports history of anxiety, panic attacks, insomnia. As well reports history of mild schizophrenia. Denies any substance or alcohol abuse.   PHYSICAL EXAMINATION:  VITAL SIGNS: Temperature 98.4, pulse 88, respiratory rate 18, blood pressure 108/62, saturating 100% on room air.  GENERAL: Well-nourished female, looks comfortable in  bed in no apparent.  EARS, NOSE, AND THROAT:  Head atraumatic, normocephalic. Pupils equal, reactive to light. Pink conjunctivae. Anicteric sclerae. Moist oral mucosa.   NECK: Supple. No thyromegaly. No JVD.  CHEST: Good air entry bilaterally. No wheezing, rales, rhonchi.  CARDIOVASCULAR: S1, S2 heard. No rubs, murmurs or gallops.  ABDOMEN: Soft, nontender, nondistended. Bowel sounds present.  EXTREMITIES: +1 to 2 edema bilaterally. No clubbing. No cyanosis. Dorsalis pedis pulse +2 bilaterally. No calf tenderness.  PSYCHIATRIC: The patient has appropriate affect. Awake, alert x3. Intact judgment and insight.  NEUROLOGIC:  Grossly intact. Motor 5/5. No focal deficits.  LYMPHATICS: No cervical or supraclavicular lymphadenopathy.  MUSCULOSKELETAL: No joint swelling or erythema could be appreciated.   PERTINENT LABORATORY DATA: BNP 342. Glucose 97, BUN 5, creatinine 0.74, sodium 134, potassium 2.2, chloride 99, CO2 of 28. Troponin less than 0.02, CK-MB 5.2, CK total 465. ALT 34, AST 39, alkaline phosphatase 110. Urinalysis negative for leukocyte esterase and nitrites. Pregnancy test is negative. EKG showing normal sinus rhythm at 86 beats per minute with QTc of 603 ms.    ASSESSMENT AND PLAN:  1.  Hypokalemia. This is most likely related to patient's diarrhea. We will replace. We will have the patient on telemetry monitor. We will recheck in a.m.  2.  Hypomagnesemia. We will replace.  3.  Prolonged QTc. The patient will be admitted to telemetry unit. Will be monitored on telemetry. We will replace her electrolytes, mainly her potassium and magnesium. We will hold her Seroquel. We will repeat EKG in a.m. after electrolytes are replaced. We will consult cardiology.  4.  Lower extremity edema, appears to be chronic. We will check venous Doppler to rule out deep vein thrombosis, as well we will hold her Norvasc, as sometimes it does cause lower extremity edema.  5.  Tobacco abuse: The patient was counseled at length. Will be started on Nicoderm patch.  6.  Chronic obstructive pulmonary disease does not appear to be in acute exacerbation. We will continue her on Advair  and DuoNebs.  7.  Hypertension: Blood pressure actually is on the lower side, so we will hold her Norvasc. As well, would consider starting her on different hypertensive medication if needed, due to her lower extremity edema.  8.  Anxiety and panic attacks. We will resume the patient on p.r.n. Xanax. Her Seroquel is being held due to her prolonged QTc. Will be started on Ambien at night, as she reports she is mainly using her Seroquel for insomnia.  9.  Gastroesophageal reflux disease. Continue with PPI.  10.  Deep vein thrombosis prophylaxis. Subcutaneous heparin.  CODE STATUS: The patient is FULL CODE.   TOTAL TIME SPENT ON ADMISSION AND PATIENT CARE: 50 minutes.   ____________________________ Albertine Patricia, MD dse:cg D: 10/20/2012 00:20:00 ET T: 10/20/2012 01:00:27 ET JOB#: [id number]  cc: Albertine Patricia, MD, <Dictator> Braxxton Stoudt Graciela Husbands MD ELECTRONICALLY SIGNED 10/20/2012 3:41

## 2014-05-02 NOTE — H&P (Signed)
PATIENT NAME:  Tamara Mcclure, Tamara Mcclure MR#:  [id number] DATE OF BIRTH:  05-17-1955  DATE OF ADMISSION:  12/12/2012  PRIMARY CARE PHYSICIAN:  Dr. Rockne Coons.   REFERRING PHYSICIAN:  Dr. Kerman Passey.   CHIEF COMPLAINT:  Chest pain.   HISTORY OF PRESENT ILLNESS:  Tamara Mcclure, a 59 year old female with history of COPD, gastroesophageal reflux disease, panic attacks, schizophrenia, has been experiencing cramps in the lower extremities.  The patient went to her primary care physician for a regular follow-up.  Labs were obtained, showed potassium of 2.5 and magnesium of 1.3.  Concerning this, the patient was sent to the Emergency Department.  The patient also was complaining of chest pain, sharp in nature in the midsternal area on the right side.  However, after the patient was started on potassium infusion the chest pain improved.  The patient states has not been eating secondary to lack of appetite.  The patient states lost some weight, however has recently gained back.  The patient is unable to explain the reason for her lack of appetite.  The patient's primary care physician has been emphasizing to the patient that she should eat three meals a day.  Denies having any fever.  The patient had recently diarrhea, however did not have any bowel movement in the last three days.   PAST MEDICAL HISTORY: 1.  Hypertension.  2.  COPD.  3.  Chronic back pain.  4.  Panic attacks.  5.  Gastroesophageal reflux disease.  6.  Agoraphobia.  7.  Bipolar disorder.  8.  Schizophrenia.   ALLERGIES:  PENICILLIN.   HOME MEDICATIONS:  1.  Seroquel 100 mg 3 times a day.  2.  Combivent 1 puff 4 times a day.  3.   350 mg 3 times a day.  4.  Amlodipine 10 mg once a day.  5.  Alprazolam 2 mg 1 tablet 4 times a day.  6.  Advair Diskus 1 puff 2 times a day.  7.  Percocet/acetaminophen/tramadol 2 tablets every six hours as needed.   SOCIAL HISTORY:  Continues to smoke 1 pack a day.  Denies drinking alcohol or using illicit drugs.   Lives by herself.   FAMILY HISTORY:  Mother died of lung cancer.  Father died of some pelvic cancer.   REVIEW OF SYSTEMS: CONSTITUTIONAL:  Experiences some generalized weakness.  EYES:  No change in vision. EARS:  No change in hearing.  RESPIRATORY:  Has mild cough with no productive sputum.  No shortness of breath.  CARDIOVASCULAR:  Had mild chest pain, however resolved after potassium infusion.  No pedal edema.  GASTROINTESTINAL:  Has poor appetite.  No abdominal pain.  GENITOURINARY:  No dysuria or hematuria.  SKIN:  No rash or lesions.  MUSCULOSKELETAL:  The patient has chronic back pain.  ENDOCRINE:  No polyuria or polydipsia.  NEUROLOGIC:  No weakness or numbness in any part of the body.  PSYCHIATRIC:  The patient carries multiple psychiatric diagnoses of bipolar disorder, agoraphobia, panic attacks and schizophrenia.   PHYSICAL EXAMINATION: GENERAL:  This is a thin-built, well-nourished, age-appropriate female lying down in the bed, not in distress.  VITAL SIGNS:  Temperature 98, pulse 82, blood pressure 106/68, respiratory rate of 20, oxygen saturation is 95% on room air.  HEENT:  Head normocephalic, atraumatic.  Eyes, no scleral icterus.  Conjunctivae normal.  Pupils equal and react to light.  Mucous membranes moist.  No pharyngeal erythema.  NECK:  Supple.  No lymphadenopathy.  No JVD.  No carotid  bruit.  No thyromegaly. CHEST:  Has mild focal tenderness on the right parasternal area.  Lungs bilateral clear to auscultation.  HEART:  S1 and S2 regular.  No murmurs are heard.  ABDOMEN:  Bowel sounds plus.  Soft, nontender, nondistended.  No hepatosplenomegaly.  EXTREMITIES:  No pedal edema.  Pulses 2+.  SKIN:  No rash or lesions.  MUSCULOSKELETAL:  Good range of motion in all the extremities.   NEUROLOGIC:  The patient is alert, oriented to place, person and time.  Cranial nerves II through XII intact.  Motor 5 by 5 in upper and lower extremities.   LABORATORY DATA:  CBC, WBC  of 7.9, hemoglobin 11, platelet count of 338.  CMP, potassium 2.5, BUN 4, creatinine of 0.65, chloride 93.  The rest of all the values are within normal limits.   Magnesium 1.3, lipase 152.  Chest x-ray shows bronchitic changes.   EKG, 12-lead, normal sinus rhythm with no ST-T wave abnormalities.   Troponin less than 0.02.   ASSESSMENT AND PLAN:  Tamara Mcclure is a 59 year old female who comes to the Emergency Department after found to have hypokalemia and hypomagnesemia.  1.  Hypokalemia.  This seems to be more of a nutritional cause.  We will supplement, replete by by mouth.  Repeat the BMP in the morning.  2.  Hypomagnesemia.  Again, this seems to more of a nutritional.  The patient will be given total of 4 grams of IV magnesium and repeat the magnesium level in the morning.  3.  Chest pain, seems to be more of a musculoskeletal pain or it could be a cramp secondary to hypokalemia, hypomagnesemia, which was resolved with IV potassium.  4.  Tobacco use.  Counseled with the patient.  5.  Anorexia, cause is uncertain.  The patient states has been losing weight.  The patient denies having any black stools.  States had colonoscopy many years back.  Concerning about the patient's nutritional deficiencies, we will obtain CT chest, abdomen and pelvis.  6.  Keep the patient on deep vein thrombosis prophylaxis with Lovenox.    ____________________________ Monica Becton, MD pv:ea D: 12/12/2012 02:25:00 ET T: 12/12/2012 02:58:04 ET JOB#: [id number]  cc: Monica Becton, MD, <Dictator> Monica Becton MD ELECTRONICALLY SIGNED 12/19/2012 21:11

## 2014-05-02 NOTE — Discharge Summary (Signed)
PATIENT NAME:  Tamara Mcclure, Tamara Mcclure MR#:  [id number] DATE OF BIRTH:  [birth year]  DATE OF ADMISSION:  12/12/2012 DATE OF DISCHARGE:  12/12/2012  DISCHARGE DIAGNOSES: 1.  Hypokalemia/hypomagnesemia, repleted and resolved. 2.  Chest pain, likely musculoskeletal or anxiety. Negative troponins, now resolved. 3.  Tobacco abuse counseled for about 3 minutes.   SECONDARY DIAGNOSES: 1.  Hypertension.  2.  Chronic obstructive pulmonary disease.  3.  Chronic back pain. 4.  Panic attacks. 5.  GERD. 6.  Agoraphobia.  7.  Bipolar disorder. 8.  Schizophrenia.   CONSULTATIONS: None.   PROCEDURES AND RADIOLOGY:  CT scan of the chest, abdomen and pelvis with contrast on 3rd of December showed no acute pathology. Chest x-ray on 2nd of December showed bronchiatic changes. No acute pulmonary abnormality.   HISTORY AND SHORT HOSPITAL COURSE:  The patient is a 59 year old female with the above-mentioned medical problems was admitted for severe hypokalemia and hypomagnesemia, along with chest pain. Her chest pain was thought to be musculoskeletal or from anxiety and her electrolytes were repleted aggressively and were resolved. She had a negative 2 sets of troponin and was ruled out. She also had some hyponatremia which was improved with hydration. She was close to her baseline and was discharged on the 3rd of December.  On the date of discharge, her vital signs were as follows: Temperature 98.4, heart rate 86 per minute, respirations 20 per minute, blood pressure 105/69 mmHg. She was saturating 92% on room air.   PERTINENT PHYSICAL EXAMINATION ON THE DATE OF DISCHARGE:  CARDIOVASCULAR: S1, S2 normal. No murmurs, rubs or gallops.  LUNGS: Clear to auscultation bilaterally. No wheezing, rales, rhonchi or crepitation.  ABDOMEN: Soft, benign.  NEUROLOGIC:  Nonfocal examination.  All other physical examination remained at baseline.   DISCHARGE MEDICATIONS: 1.  Amlodipine 10 mg p.o. daily.  2.  Advair 250/50 one  puff b.i.d. 3.  Alprazolam 2 mg p.o. 4 times a day. 4.  Combivent 1 puff inhaled 4 times a day as needed. 5.  Seroquel 100 mg p.o. 3 times a day.  6.  Carisoprodol 350 mg p.o. 3 times a day.  7.  Acetaminophen/tramadol 325/37.5 mg 2 tablets p.o. every 6 hours.  8.  Potassium tablet 10 mEq p.o. daily at bedtime as needed.   DISCHARGE DIET:  Regular.   DISCHARGE ACTIVITY:  As tolerated.  DISCHARGE INSTRUCTIONS AND FOLLOW-UP:  The patient was instructed to follow up with her primary care physician, Dr. Rockne Coons, in 1 to 2 weeks. She requested basic metabolic panel and magnesium check in 1 week with results forwarded to primary care physician for electrolytes monitoring, as she was concerned about her low potassium and magnesium.   TOTAL TIME DISCHARGING THIS PATIENT:  45 minutes.   ____________________________ Lucina Mellow. Manuella Ghazi, MD vss:ce D: 12/13/2012 16:52:59 ET T: 12/13/2012 20:23:51 ET JOB#: [id number]  cc: Wilder Amodei S. Manuella Ghazi, MD, <Dictator> Abid A. Manuella Ghazi, [place] MD ELECTRONICALLY SIGNED 12/14/2012 17:07

## 2014-05-02 NOTE — Discharge Summary (Signed)
PATIENT NAME:  Tamara Mcclure, Tamara Mcclure MR#:  [id number] DATE OF BIRTH:  1955/02/24  DATE OF ADMISSION:  10/20/2012 DATE OF DISCHARGE:  10/20/2012  ADMITTING DIAGNOSIS: Leg pain, cramping and swelling.   DISCHARGE DIAGNOSES:  1.  Leg cramping due to severe hypokalemia, status post replacement.  2.  Prolonged QT noted, likely as a result of her hypokalemia. Seen by cardiology. QT improved with potassium.  3.  Subjective complaint of swelling on extremity. No evidence of swelling noted on examination.  4.  Tobacco abuse. The patient was counseled regarding smoking cessation by the admitting physician.  5.  Chronic obstructive pulmonary disease without any evidence of acute exacerbation.  6.  Hypertension.  7.  Hypokalemia.  8.  Generalized anxiety disorder.  9.  Chronic diarrhea. Needs further outpatient gastrointestinal evaluation.  10. Gastroesophageal reflux disease.  11. Multiple psychiatric history, including anxiety, panic attacks and agoraphobia.   CONSULTANTS: Dr. Ubaldo Glassing.  PERTINENT LABS AND EVALUATIONS: Admitting glucose 97, BUN 5, creatinine 0.74, sodium was 134, potassium was 2.2, chloride 99, CO2 was 28, calcium was 8.3, bilirubin total 0.3. LFTs were normal except AST of 39. Magnesium was 1.7. Troponin was less than 0.02. Bilateral lower extremity Doppler was negative for any DVT. Repeated BMP prior to discharge, potassium was 3.6 and the last magnesium was 2.3. EKG showed a prolonged QTC at 603 msec.   HOSPITAL COURSE: Please refer to H and P done by the admitting physician. The patient is a 59 year old white female, who has a history of GERD, hypertension, COPD and multiple psychiatric history including panic attacks, anxiety and agoraphobia, who presented with complaint of having pain and cramping in her lower extremities as well as swelling. The patient had blood work done, which showed she had severe hypokalemia as well as hypomagnesemia. The patient has been having diarrhea, which she  relates to stress and anxiety. Her primary care provider recommended her to be seen by GI, but she has not. Because of her complaints of lower extremity pain, etc., she had routine blood work checked, which showed a prolonged QTc as well as severe hypokalemia and hypomagnesemia and therefore she was admitted to the hospital. She was on Seroquel, which was also held. She was seen by Dr. Ubaldo Glassing, who felt that her prolonged QTc was likely due to electrolyte imbalances. The patient was recommended to stay for her potassium to get  it better than 4 and magnesium greater than 2. The magnesium was greater than 2; however, she did not want to wait and wanted to go home. Her potassium has been replaced. I am going to continue oral supplements at home and I recommend that she needs further GI evaluation for her diarrhea. The patient's Seroquel was re-resumed in light of her electrolytes improving. At this time, she is stable for discharge.   DISCHARGE MEDICATIONS: Amlodipine 10 mg daily, Advair 250/50 mg 1 puffs b.i.d., alprazolam 2 tabs 1 tab 4 times a day, Combivent 1 puff 4 times a day as needed, Seroquel 100 mg 1 tab p.o. t.i.d.,  350 mg 1 tab p.o. t.i.d., acetaminophen/tramadol 325/37.5 mg  2 tabs q.6 hours p.r.n. for pain, KCl 20 mEq daily and magnesium oxide 400 mg 1 tab p.o. b.i.d.   DIET: Low sodium.   ACTIVITY: As tolerated.   FOLLOWUP: With primary M.D. in 1 to 2 weeks. BMP check at the time of primary M.D. visit in 2 to 4 weeks. Follow with [facility] for chronic diarrhea.   NOTE: 35 minutes spent. ____________________________  Nyree Yonker H. Posey Pronto, MD shp:aw D: 10/21/2012 08:33:42 ET T: 10/21/2012 09:32:13 ET JOB#: [id number]  cc: Gustin Zobrist H. Posey Pronto, MD, <Dictator> Alric Seton MD ELECTRONICALLY SIGNED 10/26/2012 18:30

## 2014-05-02 NOTE — Consult Note (Signed)
Present Illness 59 yo female with history of  GERD, hypertension, COPD, and multiple psychiatric history including panic attacks, anxiety and agoraphobia,  lower extremity edema who was admitted after presenting to the er with complaints of her lower extremety edema, diarrhea. She was noted to have a serum potassium of 2.2 and magnesium of 1.7. She was noted to have a prolonged qtc on her eck of 603 ms. She had no complaints of chest pain or arrythmia. She was admitted for repletion of her potassium, magnesium and to monitor for arrythmia due to her electrolyte abnormality and prolonged qtc. She has ruled out for an mi. She denies any cardiac issue. She also has been on Seroquel for sleep. This has been discontinued due to her prolonged qtc. She continues to smoke cigarettes. Lower extremety ultrasound revealed no evidence of dvt.   Physical Exam:  GEN no acute distress, ansious   HEENT PERRL, hearing intact to voice   NECK supple  No masses   RESP normal resp effort  clear BS   CARD Regular rate and rhythm  Normal, S1, S2   ABD denies tenderness  normal BS   LYMPH negative axillae   EXTR negative edema   SKIN normal to palpation, No rashes   NEURO cranial nerves intact, motor/sensory function intact   PSYCH A+O to time, place, person, anxious   Review of Systems:  Subjective/Chief Complaint diarrhea and swollen legs.   General: Weakness   Skin: No Complaints   ENT: No Complaints   Eyes: No Complaints   Neck: No Complaints   Respiratory: No Complaints   Cardiovascular: No Complaints   Gastrointestinal: No Complaints   Genitourinary: No Complaints   Vascular: peripheral edema   Musculoskeletal: No Complaints   Neurologic: No Complaints   Hematologic: No Complaints   Endocrine: No Complaints   Psychiatric: No Complaints   Review of Systems: All other systems were reviewed and found to be negative   Medications/Allergies Reviewed Medications/Allergies  reviewed   EKG:  EKG NSR   Interpretation prolonged qtc    Penicillin: Unknown   Impression 59 yo femlae with history of copd, gerd, panic attacks and bipolar disorder who was admitted for hypokalemia and hypomagnasemia with an ekg with prolonged qtc. She was complaining of peripheral edema. No evidence of dvt. CXR unremarkable. Has ruled out for mi. Was on Seroquel which may also be playing a role in her qtc along with her profound hypokalemia. Hypokalemia is likely secondary to diuretic use with concominent diarrhea. The prolonged qtc is likely seconddary to her electrolyte abnormality and also partially due to Seroquel. Agree with holding Seroquel until electrolyte abnormalities are corrected and EKG returns to normal. Edema is uncear. Pt gives some history of possible sleep apnea which may be playing a role in this.   Plan 1. Repleat K to greater than  4 and Mg greater than 2.  2. Repeat ekg after electrolytes are improved. If improved to less than 500, consider adding back Seroquel 3. Ambulate and follow for arrythmia 4. Consider discharge later today after electrolytes are corrected with outpatient follow up to follow electrolytes and consideration for evaluation for sleep apnea. 5. Smoking cessation discussed. 6. Would discharge on K Cl 20 meq daily and Mg oxylate 400 mg daily 7. Outpatient work up of her diarrhea   Electronic Signatures: Teodoro Spray (MD)  (Signed 11-Oct-14 10:03)  Authored: General Aspect/Present Illness, History and Physical Exam, Review of System, EKG , Allergies, Impression/Plan   Last  Updated: 11-Oct-14 10:03 by Teodoro Spray (MD)

## 2014-05-11 NOTE — Discharge Summary (Signed)
PATIENT NAME:  Tamara Mcclure, PIERATT MR#:  [id number] DATE OF BIRTH:  20-Aug-1955  DATE OF ADMISSION:  04/12/2014 DATE OF DISCHARGE:  04/14/2014  DISCHARGE DIAGNOSES:  1. Acute metabolic encephalopathy, now resolved, could be due to drug overdose.  2. Depression, being treated.  3. Sepsis ruled out.   SECONDARY DIAGNOSES: COPD, hypertension, GERD, chronic back pain, panic attacks, agoraphobia, bipolar disorder, schizophrenia.   CONSULTATIONS: Psychiatry, Surya K. Franchot Mimes, MD. Physical therapy. Marland Kitchen  PROCEDURES AND RADIOLOGY: Chest x-ray on April 1 showed no acute cardiopulmonary disease. CT scan of the head without contrast on April 1 showed no evidence of acute intracranial abnormality. Mild small vessel ischemic changes. Chest x-ray on April 2 showed no active disease. CT scan of the chest on April 2 showed no acute PE. Moderate to severe centrilobular emphysema. No acute pulmonary process.   MAJOR LABORATORY PANEL: UA on admission was negative. Blood cultures x2 were negative. Urine culture had no growth. Serum Tylenol and salicylate levels were within normal limits.   HISTORY AND SHORT HOSPITAL COURSE: The patient is 59 year old female with the above-mentioned medical problems, who was admitted with unresponsiveness. This was thought to be possibly from drug overdose. Please see Dr. Illene Silver Gouru's dictated history and physical for further details. There was also concern for possible sepsis on admission, which was ruled out. Psychiatry consultation was obtained with Dr. Camie Patience who recommended starting patient on Zyprexa which was done, which helped her with her appetite. She was eating her breakfast and lunch tray without any difficulty. She was evaluated by physical therapy as she kept complaining of weakness. She actually performed very well and they recommended discharge home with outpatient physical therapy. This was discussed with the patient. She was not happy with discharge, stating she has  already missed her psychiatrist appointment this morning and she would want narcotics which I have refused to refill. The patient is afraid to leave the hospital stating that she does not have a safe discharge. I have discussed discharge planning with care management social worker who is going to check her home situation. I have also discussed with the unit secretary to get her an appointment with psychiatry. She already has an appointment with her primary care physician, Dr. Keith Rake, on coming Thursday with [facility]. She does not have any other acute medical issues at this time and will be discharged today in stable condition.   VITAL SIGNS ON THE DATE OF DISCHARGE: Temperature 97.8, heart rate 79 per minute, respirations 18 per minute, blood pressure 125/81. She is saturating 96% on room air.   PERTINENT PHYSICAL EXAMINATION ON THE DATE OF DISCHARGE:  CARDIOVASCULAR: S1, S2 normal. No murmur, rubs or gallop.  LUNGS: Clear to auscultation bilaterally. No wheezing, rales, rhonchi, or crepitation.  ABDOMEN: Soft, benign.  NEUROLOGIC: Nonfocal examination. All other physical examination remained at baseline.   DISCHARGE MEDICATIONS:   Medication Instructions  amlodipine 10 mg oral tablet  1 tab(s) orally once a day   combivent cfc free 100 mcg-20 mcg/inh inhalation aerosol  1 puff(s) inhaled 4 times a day as needed for breathing problems   carisoprodol 350 mg oral tablet  1 tab(s) orally 3 times a day   alprazolam tablet 2 mg  1 tab(s) orally 3 times   k-tab 10 meq oral tablet, extended release  1 tab(s) orally once a day   seroquel 100 mg oral tablet  1 tab(s) orally once a day (at bedtime)   lyrica 75 mg oral  capsule  1 cap(s) orally once a day (at bedtime)   acetaminophen-hydrocodone 325 mg-10 mg oral tablet  1 tab(s) orally every 6 hours   escitalopram 10 mg oral tablet  1 tab(s) orally 3 times a day, As Needed   metoclopramide 5 mg oral tablet  1 tab(s) orally 4  times a day (before meals and at bedtime), As Needed   fluticasone 50 mcg inhalation powder  2 puff(s) inhaled once a day   ketorolac 10 mg oral tablet  1 tab(s) orally 3 times a day for 5 days.   lyrica 75 mg oral capsule  2 cap(s) orally once a day   olanzapine 5 mg oral tablet  1 tab(s) orally once a day (at bedtime)    DISCHARGE DIET: Regular.   DISCHARGE ACTIVITY: As tolerated.  DISCHARGE INSTRUCTIONS AND FOLLOWUP: The patient was instructed to follow up with her primary care physician, Dr. Keith Rake at [facility] on coming Thursday, on April 7, as scheduled. She will need followup with Dr. Ulis Rias from psychiatry in 1 to 2 weeks. Appointment is made for her to be seeing Dr. Bridgett Larsson on April 12 at 9 a.m. She remains at very high risk for readmission.  Total Time Spent: 45 mins ____________________________ Aubreyana Saltz S. Manuella Ghazi, MD vss:jh D: 04/14/2014 10:34:42 ET T: 04/14/2014 10:51:38 ET JOB#: [id number]  cc: Jahara Dail S. Manuella Ghazi, MD, <Dictator> Rochel Brome, MD Wayland Denis. Bridgett Larsson, MD Wallace Cullens. Franchot Mimes, MD Remer Macho MD ELECTRONICALLY SIGNED 04/16/2014 11:13

## 2014-05-11 NOTE — Consult Note (Signed)
PATIENT NAME:  Tamara Mcclure, Tamara Mcclure MR#:  [id number] DATE OF BIRTH:  12/08/1955  DATE OF CONSULTATION:  04/13/2014  CONSULTING PHYSICIAN:  Avish Torry K. Delmar Dondero, MD  SUBJECTIVE: The patient was seen in consultation in room #250. The patient is a 59 year old white female who is divorced for many years, not employed, and has been on disability for degenerative osteoarthritis and neuropathy and being in chronic pain. The patient has been on a lot of medications. The patient reports that she has agoraphobia and she is being followed by her primary care physician for the same. The patient reports her that her PCP gives her Xanax, which has not been helping her with agoraphobia. According to information obtained from the staff, the patient was unresponsive a few days ago. When the patient was asked if she took too many of her prescription pills, she said "probably so."  PAST PSYCHIATRIC HISTORY: No previous history of inpatient on psychiatry. No history of suicidal attempt. Not being followed by any psychiatrist for her agoraphobia.   ALCOHOL AND DRUGS: Denied.  MENTAL STATUS EXAMINATION: The patient was seen lying in bed quietly. Alert and oriented. Calm and cooperative. Affect is flat. Mood restricted. Became teary-eyed, stating that she lives alone and has no appetite at all and does not feel like eating. She stated that probably she took too many of her prescription pills. She denies any intention to kill herself and she does not know why she did, and she reported that she was in pain and she took too many pills. Does not appear to be responding to internal stimuli. Cognition intact. Insight and judgment guarded. Impulse control is poor.   IMPRESSION:  1.  Major depressive disorder, recurrent.   2.  History of agoraphobia.  RECOMMENDATIONS: Start the patient on Zyprexa 5 mg p.o. at bedtime, which will help her with her appetite and also with her depression. Recommend outpatient mental health followup soon after  discharge for her depression. Floor physician called and we talked about pt's depression and discussed about pt being trasferred to [facility] for help if needed.        ____________________________ Wallace Cullens. Franchot Mimes, MD skc:TT D: 04/13/2014 14:16:56 ET T: 04/13/2014 17:41:35 ET JOB#: [id number]  cc: Arlyn Leak K. Franchot Mimes, MD, <Dictator> Dewain Penning MD ELECTRONICALLY SIGNED 04/19/2014 15:34

## 2014-05-11 NOTE — Consult Note (Signed)
PATIENT NAME:  Tamara Mcclure, Tamara Mcclure MR#:  [id number] DATE OF BIRTH:  1956/01/07  DATE OF CONSULTATION:  04/06/2014  CONSULTING PHYSICIAN:  Shatona Andujar K. Franchot Mimes, MD  PLACE OF DICTATION: [facility], [place], [place], [place].   AGE: 59 years.  SEX: Female.  RACE: White.  SUBJECTIVE: The patient was seen in consultation at [facility], [place]. The patient is a 59 year old white female who is not employed and divorced for many years, and lives by herself in a house. The patient came to [facility] after having called a friend to bring her here because she had congestion in her lungs and felt she was going to have pneumonia. The patient was examined and then she was cleared of pneumonia, and several questions were asked and she said that she had suicidal wishes, but she did not mean anything and they sent her here to [facility]   PAST PSYCHIATRIC: No previous history of inpatient on psychiatry. No history of suicide attempt. Being followed by Dr. Jimmye Norman at [facility]; last appointment 3 weeks ago; next appointment coming up soon.   ALCOHOL AND DRUGS: Denies drinking alcohol. Denies street or prescription drug abuse. Does admit to smoking nicotine cigarettes at the rate of pack a day for many years.   MENTAL STATUS EXAMINATION: The patient is dressed in hospital scrubs, alert and oriented, calm, pleasant, and cooperative. No agitation. Affect is appropriate with her mood, which is stable at this time. She is glad and relieved that she does not have pneumonia. No psychosis. Does not appear to respond to internal stimuli. Cognition intact. General knowledge information fair. Memory is intact. Denies any suicidal or homicidal plan. Insight and judgment are fair and adequate   IMPRESSION: History of agoraphobia and panic/anxiety disorder, being followed by Dr. Jimmye Norman at [facility].   RECOMMENDATIONS: Discontinue involuntary commitment and the patient is being  discharged home, and she will keep her followup appointment with Dr. Jimmye Norman, and has enough medications at home.    ____________________________ Wallace Cullens. Franchot Mimes, MD skc:TT D: 04/06/2014 18:19:57 ET T: 04/07/2014 01:11:52 ET JOB#: [id number]  cc: Arlyn Leak K. Franchot Mimes, MD, <Dictator> Dewain Penning MD ELECTRONICALLY SIGNED 04/12/2014 18:22

## 2014-05-11 NOTE — H&P (Signed)
PATIENT NAME:  Tamara Mcclure, Tamara Mcclure MR#:  [id number] DATE OF BIRTH:  [birth year]  DATE OF ADMISSION:  04/11/2014  PRIMARY CARE PHYSICIAN:  Dr. Keith Rake   REFERRING ER PHYSICIAN:   Dr. Reita Cliche  CHIEF COMPLAINT:  Unresponsiveness.   HISTORY OF PRESENT ILLNESS: The patient is a 59 year old female with multiple psychiatric problems, was just recently evaluated by the ED physician 5 days ago, diagnosed with suicidal ideation, and eventually she was discharged home. Today the patient was found unresponsive by her daughter-in-law at around 11:00. She called EMS and the patient was immediately brought into the ED.  As reported by the EMS, he patient had long history of psychiatric issues and multiple suicidal attempts.  The patient is brought into the ED.   Initially, she was hypothermic with a temperature 95.2, tachycardic with a heart rate at 140, and also found to be hypoxic at 86% pulse oximetry on room air, as reported by the Emergency Room nurse. The patient was placed on a beta blocker.  Two liters of fluid boluses were given. Urine drug screen is positive for benzodiazepines, opiates, and TCAs.  Chest x-ray is negative. CT head is negative. Urinalysis is negative, but given the hypotension, hypothermia, elevated lactic acid and tachycardia, there is a suspicion for septic shock and also there is urine drug screen  being positive and with history of depression, there is suspicion for drug overdose. The patient is not responding to verbal commands, but  according to the nurse, she is responding to painful stimuli.  They could not get gag reflex as the patient was clenching her teeth and did not lower extremities them put the tube down the throat. Son is at bedside and history is taken from the son and medical records and medical staff.  PAST MEDICAL HISTORY:  History of COPD, hypertension, GERD, chronic back pain, panic attacks, agoraphobia, bipolar disorder, schizophrenia.   PAST SURGICAL HISTORY:  No known  surgeries.   ALLERGIES:   PENICILLIN.   PSYCHOSOCIAL HISTORY: Lives at home, lives alone, smokes half pack a day.   According to her son, no history of alcohol or illicit drug usage.   FAMILY HISTORY:  Her mother died of lung cancer and father died of  pelvic cancer.   REVIEW OF SYSTEMS:  Unobtainable.   HOME MEDICATIONS: Seroquel 100 mg 1 tablet p.o. at bedtime, metoclopramide 5 mg p.o. 4 times a day, Lyrica 75 mg 1 tablet p.o. at bedtime,  potassium chloride 10 mEq 1 tablet p.o. once a day, Flonase 2 puffs inhalation once daily, escitalopram 10 mg 1 tablet p.o. 3 times a day, Combivent inhalations 1 puff inhalation 4 times a day, carisoprodol 350 mg 1 tablet p.o. 3 times a day, amlodipine 10 mg once daily, alprazolam 2 mg 1 tablet 3 times a day, Tylenol 1 tablet p.o. every 6 hours.  PHYSICAL EXAMINATION:      VITAL SIGNS:   Currently temperature 98.9, pulse 107, respirations of 16, blood pressure 112/74.  During my examination, the patient's temperature was 99.1, pulse 95, respirations are 19, blood pressure 83/68.  GENERAL APPEARANCE: Not in any acute distress. The patient is not responding to verbal commands, but responding to painful stimuli.  HEENT: Normocephalic, atraumatic. Pupils are 1 mm in size, very sluggishly responding to light and accommodation. No conjunctival injection.   Dry mucous membranes.  NECK: Supple. No JVD. No thyromegaly.  LUNGS: Coarse breath sounds are present with rhonchi.  CARDIAC: S1, S2 normal. Regular rate and rhythm, tachycardic.  GASTROINTESTINAL: Soft. Bowel sounds are positive in all 4 quadrants, nontender.   NEUROLOGIC: The patient is and not responding to verbal commands, responding to painful stimuli.  Motor/sensory could not be elicited. Reflexes are 2+.  EXTREMITIES: No edema, no cyanosis, no clubbing.  SKIN: Warm to touch. Normal turgor. No rashes. No lesions.  MUSCULOSKELETAL: Could not be elicited.  PSYCHIATRIC: Could not be elicited as the  patient is unresponsive.   LABS AND IMAGING STUDIES:  Accu-Chek 133, glucose 145, BUN and creatinine are normal. Sodium and potassium is normal. BMP is normal.  Lactic acid is elevated at 2.4. LFTs are normal. Urine drug screen is positive for benzodiazepines, opiate, and tricyclic antidepressants. WBC 12.0, hemoglobin 14.2, hematocrit 43.3, platelets are 412,000. PT/INR is normal.   Urinalysis: Yellow in color, clear in appearance, nitrites and leukocyte esterase are negative. Acetaminophen level less than 10, salicylate level is less than 4.   ASSESSMENT AND PLAN: A 59 year old Caucasian female who was found unresponsive by her daughter-in-law today at around 11:00 a.m. According to the family, they do not know the downtime. The patient is unresponsive in the ED, became hypoxic, hypothermic, tachycardic, hypotensive. The patient is responding to pain stimuli.  Gag reflex could not be elicited as the patient was clenching teeth.  Her core temperature improved with Bair Hugger, status post 2 liter fluid bolus.  1. Altered mental status, unresponsive, probably secondary most likely from drug overdose and septic shock. The patient will be admitted to intensive care unit.   Will get neurological checks.  Will put her on aspiration and fall precautions.  2. Septic shock with leukocytosis, hypotension, hypothermia, tachycardia and elevated lactic acid.  Could be from aspiration pneumonia. Initial chest x-ray is negative. CT head is negative. Urinalysis is negative. Pancultures were obtained. The patient will be on broad-spectrum antibiotics with meropenem as she is allergic to penicillin.   Azithromycin will be provided for atypical coverage and vancomycin for gram-positive coverage. The patient has received 2 liters of fluid boluses.   Will  continue fluids with broad-spectrum antibiotics and will aim for a MAP close to 65. Follow up the panculture results.  Apparently at this time, infectious disease consult is  not available.   We will consider adding the infectious disease consult to Dr. Ola Spurr.  3. History of depression with recent history of suicidal ideation, possible drug overdose, and a urine drug screen is positive with benzodiazepines, opiates, and TCAs.  We will monitor the patient closely. We will provide aggressive hydration with IV fluids. Psychiatric consult is placed for possible drug overdose we will hold off on her home medication.  I have requested the son to bring her medication bottles to check  and verify how many more pills are left in the pill bottle from the date they were filled.  Psychiatric consult is placed. Once the patient is more awake and alert, she needs to be monitored by a sitter.  4. Acute hypoxic respiratory failure.   This can be from septic shock with possible aspiration and probably drug overdose as well.  The patient is currently on 6 liters of  oxygen via nasal cannula. Will titrate and wean her off the oxygen.  If necessary, we will put her on BiPAP.  5. Hypertension, which is chronic in nature, but currently, the patient is hypotensive. We will hold off on her home medications.  6. History of gastroesophageal reflux disease.  The patient will be on gastrointestinal prophylaxis.  7. History of schizophrenia and  bipolar disorder.   Will follow up with psychiatry. We will provide gastrointestinal and deep vein thrombosis prophylaxis.   As she is a full code and her son is the medical power of attorney, plan of care discussed in detail with the patient's son at bedside. He verbalized understanding of the plan. He is aware that the patient's situation is critical.   TOTAL CRITICAL CARE TIME SPENT:  Fifty minutes.    ____________________________ Nicholes Mango, MD ag:tr D: 04/11/2014 17:18:42 ET T: 04/11/2014 17:54:24 ET JOB#: [id number]  cc: Nicholes Mango, MD, <Dictator> Nicholes Mango MD ELECTRONICALLY SIGNED 04/24/2014 12:43

## 2014-05-13 DIAGNOSIS — J324 Chronic pansinusitis: Secondary | ICD-10-CM | POA: Diagnosis not present

## 2014-05-13 DIAGNOSIS — F410 Panic disorder [episodic paroxysmal anxiety] without agoraphobia: Secondary | ICD-10-CM | POA: Diagnosis not present

## 2014-05-13 DIAGNOSIS — M5416 Radiculopathy, lumbar region: Secondary | ICD-10-CM | POA: Diagnosis not present

## 2014-05-13 DIAGNOSIS — G629 Polyneuropathy, unspecified: Secondary | ICD-10-CM | POA: Diagnosis not present

## 2014-05-13 DIAGNOSIS — G8929 Other chronic pain: Secondary | ICD-10-CM | POA: Diagnosis not present

## 2014-06-11 ENCOUNTER — Ambulatory Visit (INDEPENDENT_AMBULATORY_CARE_PROVIDER_SITE_OTHER): Payer: Medicare Other | Admitting: Family Medicine

## 2014-06-11 ENCOUNTER — Ambulatory Visit: Payer: Medicare Other | Admitting: Family Medicine

## 2014-06-11 ENCOUNTER — Encounter: Payer: Self-pay | Admitting: Family Medicine

## 2014-06-11 VITALS — BP 100/60 | HR 79 | Resp 15 | Ht 63.5 in | Wt 133.0 lb

## 2014-06-11 DIAGNOSIS — M5410 Radiculopathy, site unspecified: Secondary | ICD-10-CM | POA: Diagnosis not present

## 2014-06-11 DIAGNOSIS — G8929 Other chronic pain: Secondary | ICD-10-CM | POA: Diagnosis not present

## 2014-06-11 DIAGNOSIS — M5416 Radiculopathy, lumbar region: Principal | ICD-10-CM

## 2014-06-11 DIAGNOSIS — G4700 Insomnia, unspecified: Secondary | ICD-10-CM

## 2014-06-11 DIAGNOSIS — F4001 Agoraphobia with panic disorder: Secondary | ICD-10-CM | POA: Insufficient documentation

## 2014-06-11 MED ORDER — ALPRAZOLAM 2 MG PO TABS: 2.0000 mg | ORAL_TABLET | Freq: Three times a day (TID) | ORAL | Status: DC | PRN

## 2014-06-11 MED ORDER — HYDROCODONE-ACETAMINOPHEN 10-325 MG PO TABS: 1.0000 | ORAL_TABLET | Freq: Four times a day (QID) | ORAL | Status: DC | PRN

## 2014-06-11 MED ORDER — QUETIAPINE FUMARATE 100 MG PO TABS: 100.0000 mg | ORAL_TABLET | Freq: Every day | ORAL | Status: DC

## 2014-06-11 NOTE — Progress Notes (Signed)
   Subjective:    Patient ID: Tamara Mcclure, female    DOB: 22-Sep-1955, 59 y.o.   MRN: [id number]  Back Pain This is a chronic problem. The problem occurs constantly. The problem has been gradually worsening since onset. The pain is present in the lumbar spine, sacro-iliac and gluteal. The quality of the pain is described as aching. The pain radiates to the left thigh and right thigh. The pain is at a severity of 8/10. The pain is severe. The pain is the same all the time. The symptoms are aggravated by bending. Associated symptoms include chest pain, numbness and paresthesias. Pertinent negatives include no abdominal pain, bladder incontinence, bowel incontinence or weakness. She has tried muscle relaxant, NSAIDs and analgesics for the symptoms. The treatment provided moderate relief.  Anxiety Presents for follow-up visit. Symptoms include chest pain, dizziness, excessive worry, insomnia, irritability, muscle tension, nervous/anxious behavior and panic. Patient reports no suicidal ideas. Symptoms occur constantly. The severity of symptoms is severe and causing significant distress.   Past treatments include benzodiazephines and SSRIs. The treatment provided moderate relief. Compliance with prior treatments has been good.      Review of Systems  Constitutional: Positive for irritability.  Cardiovascular: Positive for chest pain.  Gastrointestinal: Negative for abdominal pain and bowel incontinence.  Genitourinary: Negative for bladder incontinence.  Musculoskeletal: Positive for back pain.  Neurological: Positive for dizziness, numbness and paresthesias. Negative for weakness.  Psychiatric/Behavioral: Negative for suicidal ideas. The patient is nervous/anxious and has insomnia.        Objective:   Physical Exam  Constitutional: She appears well-developed.  Cardiovascular: Regular rhythm and normal pulses.   Pulmonary/Chest: Effort normal and breath sounds normal.  Musculoskeletal:    Right shoulder: She exhibits tenderness.       Arms: Psychiatric: Her speech is normal and behavior is normal. Judgment and thought content normal. Her mood appears anxious. Cognition and memory are normal.          Assessment & Plan:  1. Chronic radicular low back pain Controlled on present therapy. Refills provided.  - HYDROcodone-acetaminophen (NORCO) 10-325 MG per tablet; Take 1 tablet by mouth every 6 (six) hours as needed.  Dispense: 120 tablet; Refill: 0  2. Insomnia  - QUEtiapine (SEROQUEL) 100 MG tablet; Take 1 tablet (100 mg total) by mouth at bedtime.  Dispense: 30 tablet; Refill: 0  3. Panic disorder with agoraphobia and severe panic attacks  - alprazolam (XANAX) 2 MG tablet; Take 1 tablet (2 mg total) by mouth 3 (three) times daily as needed for anxiety.  Dispense: 90 tablet; Refill: 0

## 2014-07-03 ENCOUNTER — Other Ambulatory Visit: Payer: Self-pay

## 2014-07-03 DIAGNOSIS — R0981 Nasal congestion: Secondary | ICD-10-CM

## 2014-07-03 DIAGNOSIS — J302 Other seasonal allergic rhinitis: Secondary | ICD-10-CM

## 2014-07-03 MED ORDER — FLUTICASONE PROPIONATE 50 MCG/ACT NA SUSP: 2.0000 | Freq: Every day | NASAL | Status: DC

## 2014-07-03 NOTE — Telephone Encounter (Signed)
Pharmacy faxed refill request.  Thanks!

## 2014-07-07 ENCOUNTER — Telehealth: Payer: Self-pay | Admitting: Family Medicine

## 2014-07-07 NOTE — Telephone Encounter (Signed)
Patient would like a call back. She wants to know what the symptoms of a UTI are. [phone]

## 2014-07-07 NOTE — Telephone Encounter (Signed)
Patient coming in Thursday for evaluation.  Called and discussed.

## 2014-07-10 ENCOUNTER — Ambulatory Visit: Payer: Self-pay | Admitting: Family Medicine

## 2014-07-10 ENCOUNTER — Encounter: Payer: Self-pay | Admitting: Family Medicine

## 2014-07-10 ENCOUNTER — Ambulatory Visit (INDEPENDENT_AMBULATORY_CARE_PROVIDER_SITE_OTHER): Payer: Medicare Other | Admitting: Family Medicine

## 2014-07-10 VITALS — BP 120/70 | HR 88 | Temp 98.0°F | Ht 65.0 in | Wt 134.4 lb

## 2014-07-10 DIAGNOSIS — F419 Anxiety disorder, unspecified: Secondary | ICD-10-CM

## 2014-07-10 DIAGNOSIS — E876 Hypokalemia: Secondary | ICD-10-CM | POA: Insufficient documentation

## 2014-07-10 DIAGNOSIS — H6980 Other specified disorders of Eustachian tube, unspecified ear: Secondary | ICD-10-CM | POA: Insufficient documentation

## 2014-07-10 DIAGNOSIS — M5410 Radiculopathy, site unspecified: Secondary | ICD-10-CM | POA: Diagnosis not present

## 2014-07-10 DIAGNOSIS — G4700 Insomnia, unspecified: Secondary | ICD-10-CM | POA: Diagnosis not present

## 2014-07-10 DIAGNOSIS — F329 Major depressive disorder, single episode, unspecified: Secondary | ICD-10-CM | POA: Insufficient documentation

## 2014-07-10 DIAGNOSIS — E785 Hyperlipidemia, unspecified: Secondary | ICD-10-CM | POA: Insufficient documentation

## 2014-07-10 DIAGNOSIS — R768 Other specified abnormal immunological findings in serum: Secondary | ICD-10-CM | POA: Insufficient documentation

## 2014-07-10 DIAGNOSIS — F4001 Agoraphobia with panic disorder: Secondary | ICD-10-CM | POA: Diagnosis not present

## 2014-07-10 DIAGNOSIS — G629 Polyneuropathy, unspecified: Secondary | ICD-10-CM | POA: Diagnosis not present

## 2014-07-10 DIAGNOSIS — J45909 Unspecified asthma, uncomplicated: Secondary | ICD-10-CM | POA: Insufficient documentation

## 2014-07-10 DIAGNOSIS — R252 Cramp and spasm: Secondary | ICD-10-CM | POA: Insufficient documentation

## 2014-07-10 DIAGNOSIS — M51379 Other intervertebral disc degeneration, lumbosacral region without mention of lumbar back pain or lower extremity pain: Secondary | ICD-10-CM | POA: Insufficient documentation

## 2014-07-10 DIAGNOSIS — F32A Depression, unspecified: Secondary | ICD-10-CM | POA: Insufficient documentation

## 2014-07-10 DIAGNOSIS — R300 Dysuria: Secondary | ICD-10-CM | POA: Insufficient documentation

## 2014-07-10 DIAGNOSIS — N952 Postmenopausal atrophic vaginitis: Secondary | ICD-10-CM | POA: Insufficient documentation

## 2014-07-10 DIAGNOSIS — H9210 Otorrhea, unspecified ear: Secondary | ICD-10-CM | POA: Insufficient documentation

## 2014-07-10 DIAGNOSIS — IMO0001 Reserved for inherently not codable concepts without codable children: Secondary | ICD-10-CM | POA: Insufficient documentation

## 2014-07-10 DIAGNOSIS — M5137 Other intervertebral disc degeneration, lumbosacral region: Secondary | ICD-10-CM | POA: Insufficient documentation

## 2014-07-10 DIAGNOSIS — R399 Unspecified symptoms and signs involving the genitourinary system: Secondary | ICD-10-CM | POA: Diagnosis not present

## 2014-07-10 DIAGNOSIS — H6120 Impacted cerumen, unspecified ear: Secondary | ICD-10-CM | POA: Insufficient documentation

## 2014-07-10 DIAGNOSIS — M47817 Spondylosis without myelopathy or radiculopathy, lumbosacral region: Secondary | ICD-10-CM | POA: Insufficient documentation

## 2014-07-10 DIAGNOSIS — M5416 Radiculopathy, lumbar region: Principal | ICD-10-CM

## 2014-07-10 DIAGNOSIS — M47816 Spondylosis without myelopathy or radiculopathy, lumbar region: Secondary | ICD-10-CM | POA: Insufficient documentation

## 2014-07-10 DIAGNOSIS — G8929 Other chronic pain: Secondary | ICD-10-CM | POA: Diagnosis not present

## 2014-07-10 DIAGNOSIS — F17200 Nicotine dependence, unspecified, uncomplicated: Secondary | ICD-10-CM | POA: Insufficient documentation

## 2014-07-10 DIAGNOSIS — Z9181 History of falling: Secondary | ICD-10-CM | POA: Insufficient documentation

## 2014-07-10 DIAGNOSIS — G608 Other hereditary and idiopathic neuropathies: Secondary | ICD-10-CM

## 2014-07-10 DIAGNOSIS — M7989 Other specified soft tissue disorders: Secondary | ICD-10-CM | POA: Insufficient documentation

## 2014-07-10 DIAGNOSIS — F410 Panic disorder [episodic paroxysmal anxiety] without agoraphobia: Secondary | ICD-10-CM | POA: Insufficient documentation

## 2014-07-10 LAB — POCT URINALYSIS DIPSTICK
Bilirubin, UA: NEGATIVE
Blood, UA: NEGATIVE
Glucose, UA: NEGATIVE
KETONES UA: NEGATIVE
Leukocytes, UA: NEGATIVE
NITRITE UA: NEGATIVE
Protein, UA: NEGATIVE
UROBILINOGEN UA: 0.2
pH, UA: 5

## 2014-07-10 MED ORDER — PREGABALIN 75 MG PO CAPS: 75.0000 mg | ORAL_CAPSULE | Freq: Three times a day (TID) | ORAL | Status: DC

## 2014-07-10 MED ORDER — QUETIAPINE FUMARATE 100 MG PO TABS: 100.0000 mg | ORAL_TABLET | Freq: Every day | ORAL | Status: DC

## 2014-07-10 MED ORDER — HYDROCODONE-ACETAMINOPHEN 10-325 MG PO TABS: 1.0000 | ORAL_TABLET | Freq: Four times a day (QID) | ORAL | Status: DC | PRN

## 2014-07-10 MED ORDER — ALPRAZOLAM 2 MG PO TABS: 2.0000 mg | ORAL_TABLET | Freq: Three times a day (TID) | ORAL | Status: DC | PRN

## 2014-07-10 NOTE — Progress Notes (Signed)
Name: Tamara Mcclure   MRN: [id number]    DOB: [birth year]   Date:07/10/2014       Progress Note  Subjective  Chief Complaint  Chief Complaint  Patient presents with  . Follow-up    meds  . URI    has pain when urinating and in between times.    Back Pain This is a chronic problem. The problem occurs constantly. The problem has been gradually worsening since onset. The pain is present in the lumbar spine, sacro-iliac and gluteal. The quality of the pain is described as aching. The pain radiates to the left thigh and right thigh. The pain is at a severity of 10/10 (pt.'s pain is 'the worst pain' today). The pain is severe. The pain is the same all the time. The symptoms are aggravated by bending. Associated symptoms include dysuria, numbness (in both feet and now in her calves.), paresthesias and tingling. Pertinent negatives include no bladder incontinence, bowel incontinence or weakness. She has tried muscle relaxant, NSAIDs and analgesics for the symptoms. The treatment provided moderate relief.  Anxiety Presents for follow-up visit. Symptoms include excessive worry, insomnia, irritability, muscle tension, nausea, nervous/anxious behavior and panic. Patient reports no suicidal ideas. Symptoms occur constantly. The severity of symptoms is severe and causing significant distress.   Past treatments include benzodiazephines and SSRIs. The treatment provided moderate relief. Compliance with prior treatments has been good.  Dysuria  This is a new problem. The current episode started in the past 7 days. There has been no fever. Associated symptoms include frequency and nausea. Pertinent negatives include no chills, discharge, urgency or vomiting. She has tried nothing for the symptoms.      Past Medical History  Diagnosis Date  . Anxiety   . Depression   . COPD (chronic obstructive pulmonary disease)   . Hypertension   . Panic disorder with agoraphobia and severe panic attacks   . Chronic  lower back pain   . Peripheral sensory neuropathy     History reviewed. No pertinent past surgical history.  Family History  Problem Relation Age of Onset  . Cancer Mother     Lung  . Cancer Father     History   Social History  . Marital Status: Single    Spouse Name: N/A  . Number of Children: N/A  . Years of Education: N/A   Occupational History  . Not on file.   Social History Main Topics  . Smoking status: Current Every Day Smoker -- 1.00 packs/day for 30 years    Types: Cigarettes  . Smokeless tobacco: Not on file  . Alcohol Use: No  . Drug Use: No  . Sexual Activity: Not on file   Other Topics Concern  . Not on file   Social History Narrative     Current outpatient prescriptions:  .  alprazolam (XANAX) 2 MG tablet, Take 1 tablet (2 mg total) by mouth 3 (three) times daily as needed for anxiety., Disp: 90 tablet, Rfl: 0 .  amLODipine (NORVASC) 10 MG tablet, Take by mouth., Disp: , Rfl:  .  carisoprodol (SOMA) 350 MG tablet, Take by mouth., Disp: , Rfl:  .  fluticasone (FLONASE) 50 MCG/ACT nasal spray, Place 2 sprays into both nostrils daily., Disp: 16 g, Rfl: 0 .  Fluticasone-Salmeterol (ADVAIR) 250-50 MCG/DOSE AEPB, Inhale into the lungs., Disp: , Rfl:  .  HYDROcodone-acetaminophen (NORCO) 10-325 MG per tablet, Take 1 tablet by mouth every 6 (six) hours as needed., Disp: 120 tablet, Rfl:  0 .  ibuprofen (ADVIL,MOTRIN) 200 MG tablet, Take 200 mg by mouth every 6 (six) hours as needed., Disp: , Rfl:  .  Ipratropium-Albuterol (COMBIVENT) 20-100 MCG/ACT AERS respimat, Inhale into the lungs., Disp: , Rfl:  .  potassium chloride (K-DUR,KLOR-CON) 10 MEQ tablet, Take by mouth., Disp: , Rfl:  .  pregabalin (LYRICA) 75 MG capsule, Take by mouth., Disp: , Rfl:  .  QUEtiapine (SEROQUEL) 100 MG tablet, Take 1 tablet (100 mg total) by mouth at bedtime., Disp: 30 tablet, Rfl: 0 .  Ibuprofen-Diphenhydramine Cit 200-38 MG TABS, Take by mouth., Disp: , Rfl:   Allergies    Allergen Reactions  . Penicillins Rash     Review of Systems  Constitutional: Positive for irritability. Negative for chills.  Gastrointestinal: Positive for nausea. Negative for vomiting and bowel incontinence.  Genitourinary: Positive for dysuria and frequency. Negative for bladder incontinence and urgency.  Musculoskeletal: Positive for myalgias, back pain and joint pain.  Neurological: Positive for tingling, numbness (in both feet and now in her calves.) and paresthesias. Negative for weakness.  Psychiatric/Behavioral: Negative for suicidal ideas. The patient is nervous/anxious and has insomnia.       Objective  Filed Vitals:   07/10/14 1119  BP: 120/70  Pulse: 88  Temp: 98 F (36.7 C)  TempSrc: Oral  Height: 5\' 5"  (1.651 m)  Weight: 134 lb 6.4 oz (60.963 kg)  SpO2: 96%    Physical Exam  Constitutional: She is oriented to person, place, and time and well-developed, well-nourished, and in no distress.  HENT:  Head: Normocephalic and atraumatic.  Cardiovascular: Normal rate.   Pulmonary/Chest: Effort normal.  Abdominal: Soft. Normal appearance and bowel sounds are normal. There is no tenderness. There is no CVA tenderness.  Musculoskeletal:       Lumbar back: She exhibits tenderness, pain and spasm.       Back:       Arms: Tenderness and muscle spasm in lumbar spine.  Neurological: She is alert and oriented to person, place, and time.  Psychiatric: Mood, memory and judgment normal. She has a flat affect.  Nursing note and vitals reviewed.    Assessment & Plan 1. Chronic radicular low back pain Chronic low back pain, stable on present opioid therapy. Patient compliant with controlled substances agreement. Refills provided - HYDROcodone-acetaminophen (NORCO) 10-325 MG per tablet; Take 1 tablet by mouth every 6 (six) hours as needed.  Dispense: 120 tablet; Refill: 0  2. Insomnia  - QUEtiapine (SEROQUEL) 100 MG tablet; Take 1 tablet (100 mg total) by mouth at  bedtime.  Dispense: 30 tablet; Refill: 2  3. Panic disorder with agoraphobia and severe panic attacks Symptoms stable on present benzodiazepine therapy. Patient compliant with controlled substances agreement. - alprazolam (XANAX) 2 MG tablet; Take 1 tablet (2 mg total) by mouth 3 (three) times daily as needed for anxiety.  Dispense: 90 tablet; Refill: 0  4. Peripheral sensory neuropathy  - pregabalin (LYRICA) 75 MG capsule; Take 1 capsule (75 mg total) by mouth 3 (three) times daily.  Dispense: 90 capsule; Refill: 0  5. Symptoms of urinary tract infection Urine dip results reviewed with patient and reassured. If her symptoms continue, she will return for further evaluation. - Urinalysis, dipstick only - POCT urinalysis dipstick  There are no diagnoses linked to this encounter.  Daylyn Azbill Asad A. [facility] 07/10/2014 11:38 AM

## 2014-07-23 ENCOUNTER — Telehealth: Payer: Self-pay | Admitting: Family Medicine

## 2014-07-23 NOTE — Telephone Encounter (Signed)
Tamara Mcclure is calling and she has had blood in her stool, could be constipation. PQRS9 scale rating is 13, pain is not controlled.

## 2014-07-24 ENCOUNTER — Telehealth: Payer: Self-pay | Admitting: Obstetrics and Gynecology

## 2014-07-24 NOTE — Telephone Encounter (Signed)
Notified pt to take black cohosh otc she voiced understanding

## 2014-07-24 NOTE — Telephone Encounter (Signed)
pls advise

## 2014-07-24 NOTE — Telephone Encounter (Signed)
Tamara Mcclure is calling and she has had blood in her stool, could be constipation. PQRS9 scale rating is 13, pain is not controlled.

## 2014-07-24 NOTE — Telephone Encounter (Signed)
Patient states that she has been constipated for a few days and she has noticed blood on the toilet paper when she wipes after having a hard bowel movement. The nurse has given her a kit to check for blood in stools. In addition, patient is requesting therapy for vasomotor symptoms of menopause. I have recommended that patient should schedule an appointment for next week to discuss both constipation and menopausal symptoms and will address these at her appointment. Patient verbalized understanding and will call tomorrow to make an appointment

## 2014-07-24 NOTE — Telephone Encounter (Signed)
Tamara Mcclure PT WANTS TO KNOW WHAT OTC PRODUCTS DOES MELODY RECOMMEND FOR MENAPAUSE (HOTFLASHES AND DRY SKIN)

## 2014-07-24 NOTE — Telephone Encounter (Signed)
Black Cohosh is the only thing OTC I have seen help, to take it 2 x day

## 2014-07-25 NOTE — Telephone Encounter (Signed)
Patient has scheduled appointment for 08/05/2014 to discuss both constipation and menopausal symptoms.

## 2014-07-31 ENCOUNTER — Other Ambulatory Visit: Payer: Self-pay | Admitting: Family Medicine

## 2014-07-31 DIAGNOSIS — R0981 Nasal congestion: Secondary | ICD-10-CM

## 2014-07-31 MED ORDER — FLUTICASONE PROPIONATE 50 MCG/ACT NA SUSP: 2.0000 | Freq: Every day | NASAL | Status: DC

## 2014-07-31 NOTE — Telephone Encounter (Signed)
Medication has been refilled and sent to [facility], patient has a schedule appointment on 08/05/2014

## 2014-08-05 ENCOUNTER — Encounter: Payer: Self-pay | Admitting: Family Medicine

## 2014-08-05 ENCOUNTER — Ambulatory Visit: Payer: Medicare Other | Admitting: Family Medicine

## 2014-08-05 ENCOUNTER — Ambulatory Visit (INDEPENDENT_AMBULATORY_CARE_PROVIDER_SITE_OTHER): Payer: Medicare Other | Admitting: Family Medicine

## 2014-08-05 VITALS — BP 122/70 | HR 86 | Temp 97.7°F | Resp 18 | Ht 65.0 in | Wt 133.3 lb

## 2014-08-05 DIAGNOSIS — M5416 Radiculopathy, lumbar region: Principal | ICD-10-CM

## 2014-08-05 DIAGNOSIS — G629 Polyneuropathy, unspecified: Secondary | ICD-10-CM

## 2014-08-05 DIAGNOSIS — E876 Hypokalemia: Secondary | ICD-10-CM | POA: Diagnosis not present

## 2014-08-05 DIAGNOSIS — M5410 Radiculopathy, site unspecified: Secondary | ICD-10-CM

## 2014-08-05 DIAGNOSIS — G8929 Other chronic pain: Secondary | ICD-10-CM

## 2014-08-05 DIAGNOSIS — F4001 Agoraphobia with panic disorder: Secondary | ICD-10-CM

## 2014-08-05 DIAGNOSIS — N951 Menopausal and female climacteric states: Secondary | ICD-10-CM | POA: Diagnosis not present

## 2014-08-05 DIAGNOSIS — G608 Other hereditary and idiopathic neuropathies: Secondary | ICD-10-CM

## 2014-08-05 MED ORDER — PREGABALIN 75 MG PO CAPS: 75.0000 mg | ORAL_CAPSULE | Freq: Three times a day (TID) | ORAL | Status: DC

## 2014-08-05 MED ORDER — ALPRAZOLAM 2 MG PO TABS: 2.0000 mg | ORAL_TABLET | Freq: Three times a day (TID) | ORAL | Status: DC | PRN

## 2014-08-05 MED ORDER — HYDROCODONE-ACETAMINOPHEN 10-325 MG PO TABS: 1.0000 | ORAL_TABLET | Freq: Four times a day (QID) | ORAL | Status: DC | PRN

## 2014-08-05 MED ORDER — POTASSIUM CHLORIDE CRYS ER 10 MEQ PO TBCR: 10.0000 meq | EXTENDED_RELEASE_TABLET | Freq: Every day | ORAL | Status: DC

## 2014-08-05 MED ORDER — VENLAFAXINE HCL ER 75 MG PO CP24: 75.0000 mg | ORAL_CAPSULE | Freq: Every day | ORAL | Status: DC

## 2014-08-05 NOTE — Progress Notes (Signed)
Name: Tamara Mcclure   MRN: [id number]    DOB: Dec 23, 1955   Date:08/05/2014       Progress Note  Subjective  Chief Complaint  Chief Complaint  Patient presents with  . Follow-up    4 wk  . Hyperlipidemia    Back Pain This is a chronic problem. The pain is present in the lumbar spine. The pain is at a severity of 8/10. The pain is severe. The symptoms are aggravated by bending and position. Associated symptoms include chest pain, numbness and paresthesias. Pertinent negatives include no bladder incontinence or bowel incontinence. She has tried analgesics, NSAIDs and muscle relaxant for the symptoms. The treatment provided moderate relief.  Anxiety Presents for follow-up visit. Symptoms include chest pain, excessive worry, irritability, malaise, muscle tension, nervous/anxious behavior, panic and shortness of breath. Symptoms occur most days.   Past treatments include benzodiazephines. The treatment provided significant relief. Compliance with prior treatments has been good.  Menopausal Symptoms Pt. has symptoms of hot flashes, night sweats, fatigue, and irritability. She has been on Depo shots before for menopausal symptoms' prescribed by previous PCP. She is requesting to be restarted back on the Depo shot.   Past Medical History  Diagnosis Date  . Anxiety   . Depression   . COPD (chronic obstructive pulmonary disease)   . Hypertension   . Panic disorder with agoraphobia and severe panic attacks   . Chronic lower back pain   . Peripheral sensory neuropathy     History reviewed. No pertinent past surgical history.  Family History  Problem Relation Age of Onset  . Cancer Mother     Lung  . Cancer Father     History   Social History  . Marital Status: Single    Spouse Name: N/A  . Number of Children: N/A  . Years of Education: N/A   Occupational History  . Not on file.   Social History Main Topics  . Smoking status: Current Every Day Smoker -- 1.00 packs/day for 30  years    Types: Cigarettes  . Smokeless tobacco: Not on file  . Alcohol Use: No  . Drug Use: No  . Sexual Activity: Not on file   Other Topics Concern  . Not on file   Social History Narrative     Current outpatient prescriptions:  .  alprazolam (XANAX) 2 MG tablet, Take 1 tablet (2 mg total) by mouth 3 (three) times daily as needed for anxiety., Disp: 90 tablet, Rfl: 0 .  amLODipine (NORVASC) 10 MG tablet, Take by mouth., Disp: , Rfl:  .  carisoprodol (SOMA) 350 MG tablet, Take by mouth., Disp: , Rfl:  .  fluticasone (FLONASE) 50 MCG/ACT nasal spray, Place 2 sprays into both nostrils daily., Disp: 16 g, Rfl: 0 .  Fluticasone-Salmeterol (ADVAIR) 250-50 MCG/DOSE AEPB, Inhale into the lungs., Disp: , Rfl:  .  HYDROcodone-acetaminophen (NORCO) 10-325 MG per tablet, Take 1 tablet by mouth every 6 (six) hours as needed., Disp: 120 tablet, Rfl: 0 .  ibuprofen (ADVIL,MOTRIN) 200 MG tablet, Take 200 mg by mouth every 6 (six) hours as needed., Disp: , Rfl:  .  Ibuprofen-Diphenhydramine Cit 200-38 MG TABS, Take by mouth., Disp: , Rfl:  .  Ipratropium-Albuterol (COMBIVENT) 20-100 MCG/ACT AERS respimat, Inhale into the lungs., Disp: , Rfl:  .  potassium chloride (K-DUR,KLOR-CON) 10 MEQ tablet, Take by mouth., Disp: , Rfl:  .  pregabalin (LYRICA) 75 MG capsule, Take 1 capsule (75 mg total) by mouth 3 (three) times  daily., Disp: 90 capsule, Rfl: 0 .  QUEtiapine (SEROQUEL) 100 MG tablet, Take 1 tablet (100 mg total) by mouth at bedtime., Disp: 30 tablet, Rfl: 2  Allergies  Allergen Reactions  . Penicillins Rash     Review of Systems  Constitutional: Positive for irritability.  Respiratory: Positive for shortness of breath.   Cardiovascular: Positive for chest pain.  Gastrointestinal: Negative for bowel incontinence.  Genitourinary: Negative for bladder incontinence.  Musculoskeletal: Positive for back pain.  Neurological: Positive for numbness and paresthesias.  Psychiatric/Behavioral: The  patient is nervous/anxious.      Objective  Filed Vitals:   08/05/14 1046  BP: 122/70  Pulse: 86  Temp: 97.7 F (36.5 C)  TempSrc: Oral  Resp: 18  Height: 5\' 5"  (1.651 m)  Weight: 133 lb 4.8 oz (60.464 kg)  SpO2: 97%    Physical Exam  Constitutional: She is oriented to person, place, and time and well-developed, well-nourished, and in no distress.  HENT:  Head: Normocephalic and atraumatic.  Cardiovascular: Normal rate and regular rhythm.   Pulmonary/Chest: Effort normal and breath sounds normal.  Musculoskeletal: She exhibits no edema.       Lumbar back: She exhibits decreased range of motion, tenderness, pain and spasm.       Back:  Neurological: She is alert and oriented to person, place, and time.  Skin: Skin is warm and dry.  Psychiatric: Memory, affect and judgment normal.  Nursing note and vitals reviewed.    Assessment & Plan  1. Chronic radicular low back pain Symptoms chronic lower back pain responsive to opioid therapy. Patient is compliant with her controlled substances agreement. She is aware of the dependence potential of opioids. Refills provided and follow-up in one month - HYDROcodone-acetaminophen (NORCO) 10-325 MG per tablet; Take 1 tablet by mouth every 6 (six) hours as needed.  Dispense: 120 tablet; Refill: 0  2. Peripheral sensory neuropathy Symptoms responsive to Lyrica 75 mg 3 times a day. - pregabalin (LYRICA) 75 MG capsule; Take 1 capsule (75 mg total) by mouth 3 (three) times daily.  Dispense: 90 capsule; Refill: 0  3. Panic disorder with agoraphobia and severe panic attacks Symptoms responsive to current benzodiazepine therapy. Patient aware of the dependence potential and the interactions of benzodiazepines. Refills provided - alprazolam (XANAX) 2 MG tablet; Take 1 tablet (2 mg total) by mouth 3 (three) times daily as needed for anxiety.  Dispense: 90 tablet; Refill: 0  4. Menopausal symptoms Patient will be started on Effexor ER 75 g  daily for vasomotor symptoms of menopause. In the past, she has been on Depakote shot by her previous PCP. If she fails no on hormonal therapy, she may be referred to gynecology for consideration of HRT. - venlafaxine XR (EFFEXOR XR) 75 MG 24 hr capsule; Take 1 capsule (75 mg total) by mouth daily with breakfast.  Dispense: 30 capsule; Refill: 0  5. Hypokalemia  - potassium chloride (K-DUR,KLOR-CON) 10 MEQ tablet; Take 1 tablet (10 mEq total) by mouth daily.  Dispense: 90 tablet; Refill: 0   Keval Nam Asad A. [facility] 08/05/2014 11:13 AM

## 2014-08-06 ENCOUNTER — Ambulatory Visit: Payer: Medicare Other | Admitting: Family Medicine

## 2014-08-11 ENCOUNTER — Telehealth: Payer: Self-pay | Admitting: Family Medicine

## 2014-08-11 NOTE — Telephone Encounter (Signed)
Pt stated that she has been taking Lyrica for a while and she has been experiencing extreme dry skin and trouble seeing since being on the medication.  Pt is not sure if these symptoms are due to the Lyrica. Please advise.

## 2014-08-12 NOTE — Telephone Encounter (Signed)
Routed encounter to Dr. Manuella Ghazi

## 2014-08-13 NOTE — Telephone Encounter (Signed)
Dr. Manuella Ghazi called and spoke with patient and advised patient what to do concerning medication reaction, patient verbalized undestanding.

## 2014-08-13 NOTE — Telephone Encounter (Signed)
Patient is experiencing dry skin and has blurry vision in the morning which gets better during the day. She is wondering if this may be a side effect of Lyrica. Upon checking [facility], blurred vision is listed as a potential side effect of Lyrica. Patient wants to stop taking Lyrica and we have recommended tapering it off over one week to a complete stop. She is currently on 75 mg twice a day and will decrease it down to 75 mg daily and stop over 1 week. Patient verbalized understanding with the plan.

## 2014-08-25 ENCOUNTER — Encounter: Payer: Self-pay | Admitting: Family Medicine

## 2014-08-26 ENCOUNTER — Telehealth: Payer: Self-pay | Admitting: Family Medicine

## 2014-08-26 NOTE — Telephone Encounter (Signed)
Is there a condition that causes you not to perspire or sweat at all. Even in this hot weather she does not sweat. Her body and face never gets oily. Please advise.

## 2014-08-27 NOTE — Telephone Encounter (Signed)
Routed to Dr. Shah for advice  

## 2014-08-27 NOTE — Telephone Encounter (Signed)
Dr. Manuella Ghazi has spoken with patient and she has an appointment scheduled for 09/02/2014

## 2014-08-27 NOTE — Telephone Encounter (Signed)
Patient seems to be concerned about not sweating. She stays mainly indoors at a comfortable room temperature and has not been outside in the heat and sun. She also thinks that she has dry skin. Patient is scheduled for an appointment next week and will discuss and evaluate the symptoms.

## 2014-09-02 ENCOUNTER — Ambulatory Visit (INDEPENDENT_AMBULATORY_CARE_PROVIDER_SITE_OTHER): Payer: Medicare Other | Admitting: Family Medicine

## 2014-09-02 ENCOUNTER — Encounter: Payer: Self-pay | Admitting: Family Medicine

## 2014-09-02 VITALS — BP 126/70 | HR 95 | Temp 98.3°F | Resp 17 | Ht 65.0 in | Wt 132.5 lb

## 2014-09-02 DIAGNOSIS — G8929 Other chronic pain: Secondary | ICD-10-CM

## 2014-09-02 DIAGNOSIS — N951 Menopausal and female climacteric states: Secondary | ICD-10-CM

## 2014-09-02 DIAGNOSIS — M5410 Radiculopathy, site unspecified: Secondary | ICD-10-CM | POA: Diagnosis not present

## 2014-09-02 DIAGNOSIS — I10 Essential (primary) hypertension: Secondary | ICD-10-CM

## 2014-09-02 DIAGNOSIS — E876 Hypokalemia: Secondary | ICD-10-CM

## 2014-09-02 DIAGNOSIS — F4001 Agoraphobia with panic disorder: Secondary | ICD-10-CM | POA: Diagnosis not present

## 2014-09-02 DIAGNOSIS — R0981 Nasal congestion: Secondary | ICD-10-CM

## 2014-09-02 DIAGNOSIS — L853 Xerosis cutis: Secondary | ICD-10-CM | POA: Insufficient documentation

## 2014-09-02 DIAGNOSIS — M5416 Radiculopathy, lumbar region: Principal | ICD-10-CM

## 2014-09-02 MED ORDER — AMLODIPINE BESYLATE 10 MG PO TABS: 10.0000 mg | ORAL_TABLET | Freq: Every day | ORAL | Status: DC

## 2014-09-02 MED ORDER — POTASSIUM CHLORIDE CRYS ER 10 MEQ PO TBCR: 10.0000 meq | EXTENDED_RELEASE_TABLET | Freq: Every day | ORAL | Status: DC

## 2014-09-02 MED ORDER — ALPRAZOLAM 2 MG PO TABS: 2.0000 mg | ORAL_TABLET | Freq: Three times a day (TID) | ORAL | Status: DC | PRN

## 2014-09-02 MED ORDER — FLUTICASONE PROPIONATE 50 MCG/ACT NA SUSP: 2.0000 | Freq: Every day | NASAL | Status: DC

## 2014-09-02 MED ORDER — HYDROCODONE-ACETAMINOPHEN 10-325 MG PO TABS: 1.0000 | ORAL_TABLET | Freq: Four times a day (QID) | ORAL | Status: DC | PRN

## 2014-09-02 NOTE — Progress Notes (Signed)
Name: Tamara Mcclure   MRN: [id number]    DOB: 09/18/1955   Date:09/02/2014       Progress Note  Subjective  Chief Complaint  Chief Complaint  Patient presents with  . Follow-up    4 wk  . Medication Refill    Hydrocodone 10-325mg  / alprazolam 2mg  / amlodipine 10mg  / potassium chloride 64mEq / Flonase 27mcg    Back Pain This is a chronic problem. The pain is present in the lumbar spine. The quality of the pain is described as aching. The pain is at a severity of 8/10. The symptoms are aggravated by bending. Pertinent negatives include no chest pain, headaches, leg pain, tingling or weakness. She has tried analgesics and muscle relaxant for the symptoms.  Anxiety Presents for follow-up visit. Symptoms include depressed mood, excessive worry, nervous/anxious behavior and panic. Patient reports no chest pain or palpitations.   Her past medical history is significant for anxiety/panic attacks. Past treatments include benzodiazephines.  Hypertension This is a chronic problem. The problem is controlled. Associated symptoms include anxiety. Pertinent negatives include no chest pain, headaches or palpitations. Past treatments include calcium channel blockers. There is no history of kidney disease, CAD/MI or CVA.  Dry skin Patient concerned about dry skin, sometimes she feels she does not sweat as much as she should, although she spends most of her times indoors. No other concerning symptoms.   Past Medical History  Diagnosis Date  . Anxiety   . Depression   . COPD (chronic obstructive pulmonary disease)   . Hypertension   . Panic disorder with agoraphobia and severe panic attacks   . Chronic lower back pain   . Peripheral sensory neuropathy     History reviewed. No pertinent past surgical history.  Family History  Problem Relation Age of Onset  . Cancer Mother     Lung  . Cancer Father     Social History   Social History  . Marital Status: Single    Spouse Name: N/A  .  Number of Children: N/A  . Years of Education: N/A   Occupational History  . Not on file.   Social History Main Topics  . Smoking status: Current Every Day Smoker -- 1.00 packs/day for 30 years    Types: Cigarettes  . Smokeless tobacco: Not on file  . Alcohol Use: No  . Drug Use: No  . Sexual Activity: Not on file   Other Topics Concern  . Not on file   Social History Narrative     Current outpatient prescriptions:  .  alprazolam (XANAX) 2 MG tablet, Take 1 tablet (2 mg total) by mouth 3 (three) times daily as needed for anxiety., Disp: 90 tablet, Rfl: 0 .  amLODipine (NORVASC) 10 MG tablet, Take by mouth., Disp: , Rfl:  .  carisoprodol (SOMA) 350 MG tablet, Take by mouth., Disp: , Rfl:  .  fluticasone (FLONASE) 50 MCG/ACT nasal spray, Place 2 sprays into both nostrils daily., Disp: 16 g, Rfl: 0 .  Fluticasone-Salmeterol (ADVAIR) 250-50 MCG/DOSE AEPB, Inhale into the lungs., Disp: , Rfl:  .  HYDROcodone-acetaminophen (NORCO) 10-325 MG per tablet, Take 1 tablet by mouth every 6 (six) hours as needed., Disp: 120 tablet, Rfl: 0 .  ibuprofen (ADVIL,MOTRIN) 200 MG tablet, Take 200 mg by mouth every 6 (six) hours as needed., Disp: , Rfl:  .  Ibuprofen-Diphenhydramine Cit 200-38 MG TABS, Take by mouth., Disp: , Rfl:  .  Ipratropium-Albuterol (COMBIVENT) 20-100 MCG/ACT AERS respimat, Inhale into the  lungs., Disp: , Rfl:  .  potassium chloride (K-DUR,KLOR-CON) 10 MEQ tablet, Take 1 tablet (10 mEq total) by mouth daily., Disp: 90 tablet, Rfl: 0 .  pregabalin (LYRICA) 75 MG capsule, Take 1 capsule (75 mg total) by mouth 3 (three) times daily., Disp: 90 capsule, Rfl: 0 .  QUEtiapine (SEROQUEL) 100 MG tablet, Take 1 tablet (100 mg total) by mouth at bedtime., Disp: 30 tablet, Rfl: 2  Allergies  Allergen Reactions  . Penicillins Rash     Review of Systems  Cardiovascular: Negative for chest pain and palpitations.  Musculoskeletal: Positive for back pain.  Neurological: Negative for  tingling, weakness and headaches.  Psychiatric/Behavioral: The patient is nervous/anxious.       Objective  Filed Vitals:   09/02/14 0949  BP: 126/70  Pulse: 95  Temp: 98.3 F (36.8 C)  TempSrc: Oral  Resp: 17  Height: 5\' 5"  (1.651 m)  Weight: 132 lb 8 oz (60.102 kg)  SpO2: 96%    Physical Exam  Constitutional: She is oriented to person, place, and time and well-developed, well-nourished, and in no distress.  Cardiovascular: Normal rate and regular rhythm.   Pulmonary/Chest: She has wheezes (diffuse soft expiratory wheezing in both lung fields.).  Musculoskeletal: She exhibits no edema.       Lumbar back: She exhibits decreased range of motion, tenderness and pain.       Back:  Neurological: She is alert and oriented to person, place, and time.  Skin: Skin is warm and dry.  Psychiatric: Affect and judgment normal. Her mood appears anxious.  Nursing note and vitals reviewed.   Assessment & Plan  1. Chronic radicular low back pain Stable and controlled on present opioid therapy. Patient compliant with controlled substances agreement and understands the dependence potential and interactions of opioids. Refills provided and follow-up in one month. - HYDROcodone-acetaminophen (NORCO) 10-325 MG per tablet; Take 1 tablet by mouth every 6 (six) hours as needed.  Dispense: 120 tablet; Refill: 0  2. Panic disorder with agoraphobia and severe panic attacks Stable on present benzodiazepine therapy. Patient understands the dependence potential and interactions of benzodiazepines. Refills provided and follow-up in one month. - alprazolam (XANAX) 2 MG tablet; Take 1 tablet (2 mg total) by mouth 3 (three) times daily as needed for anxiety.  Dispense: 90 tablet; Refill: 0  3. Essential hypertension  - amLODipine (NORVASC) 10 MG tablet; Take 1 tablet (10 mg total) by mouth daily.  Dispense: 90 tablet; Refill: 1  4. Menopausal symptoms DC venlafaxine because of side effects. Patient  was asked to schedule an appointment with gynecologist to discuss HRT. In the past, patient has been on progesterone by her previous PCP Dr. Clayborn Bigness.  5. Hypokalemia  - potassium chloride (K-DUR,KLOR-CON) 10 MEQ tablet; Take 1 tablet (10 mEq total) by mouth daily.  Dispense: 90 tablet; Refill: 0  6. Nasal sinus congestion  - fluticasone (FLONASE) 50 MCG/ACT nasal spray; Place 2 sprays into both nostrils daily.  Dispense: 16 g; Refill: 0  7. Dry skin  - TSH   Zymiere Trostle Asad A. [facility] 09/02/2014 10:07 AM

## 2014-09-18 ENCOUNTER — Encounter: Payer: Self-pay | Admitting: Obstetrics and Gynecology

## 2014-09-18 ENCOUNTER — Ambulatory Visit (INDEPENDENT_AMBULATORY_CARE_PROVIDER_SITE_OTHER): Payer: Medicare Other | Admitting: Obstetrics and Gynecology

## 2014-09-18 VITALS — BP 108/68 | HR 85 | Ht 63.0 in | Wt 131.3 lb

## 2014-09-18 DIAGNOSIS — Z720 Tobacco use: Secondary | ICD-10-CM

## 2014-09-18 DIAGNOSIS — N951 Menopausal and female climacteric states: Secondary | ICD-10-CM | POA: Diagnosis not present

## 2014-09-18 MED ORDER — PROGESTERONE MICRONIZED 200 MG PO CAPS: 200.0000 mg | ORAL_CAPSULE | Freq: Every day | ORAL | Status: DC

## 2014-09-18 NOTE — Progress Notes (Signed)
Patient ID: Tamara Mcclure, female   DOB: May 17, 1955, 59 y.o.   MRN: [id number] Pt c/o of menopausal sx  dry skin, depression, hair thinning Has been on depo provera x 20 years off for the last 2-3 Thinks she needs hrt 07/2014- tried black cohosh per mnb did not work   Risk analyst complaint: 1.  Menopausal symptoms.  Patient is a 59 year old white female [id number], On no HRT therapy, who presents for menopausal symptoms. Patient was on Depo-Provera for 20+ years and discontinued that medication at age 36.  Patient has since noted dryness of skin, thinning of skin, thinning of hair, depression type symptoms, occasional hot flashes without night sweats.  She is experiencing significant vaginal dryness. Patient has tried over-the-counter meds for menopause without success, including black cohosh. She is a tobacco user, long-term, smoking 1 pack a day.  Past Medical History  Diagnosis Date  . Anxiety   . Depression   . COPD (chronic obstructive pulmonary disease)   . Hypertension   . Panic disorder with agoraphobia and severe panic attacks   . Chronic lower back pain   . Peripheral sensory neuropathy    Past Surgical History  Procedure Laterality Date  . None     Review of systems: Per HPI.  OBJECTIVE: BP 108/68 mmHg  Pulse 85  Ht 5\' 3"  (1.6 m)  Wt 131 lb 4.8 oz (59.557 kg)  BMI 23.26 kg/m2 Physical exam deferred.  IMPRESSION: 1.  Symptomatic menopausal state. 2.  Tobacco user.  PLAN: 1.  Patient has been counseled regarding pros and cons of HRT therapy and the effect smoking, has on risk. 2.  Patient is strongly encouraged to stop smoking or at least decrease tobacco use. 3.  Trial of micronized progesterone 200 mg daily at bedtime.  This may benefit the patient given that she felt extremely healthy on Depo-Provera. 4.  Return in 2 months for follow-up.  Should symptoms not be markedly improved, we will consider adding low-dose estrogen therapy to her regimen.  A total of 25  minutes were spent face-to-face with the patient during this encounter and over half of that time involved counseling and coordination of care.

## 2014-09-18 NOTE — Patient Instructions (Signed)
1.  Smoking cessation encouraged. 2.  Trial of micronized progesterone 200 mg daily at bedtime 3.  Return in 2 months for follow-up. 4.  HRT therapy, risks were reviewed.

## 2014-09-19 LAB — TSH: TSH: 0.312 u[IU]/mL — ABNORMAL LOW (ref 0.450–4.500)

## 2014-09-29 ENCOUNTER — Emergency Department
Admission: EM | Admit: 2014-09-29 | Discharge: 2014-09-29 | Disposition: A | Discharge status: Home / Self Care | Payer: Medicare Other | Attending: Emergency Medicine | Admitting: Emergency Medicine

## 2014-09-29 ENCOUNTER — Encounter: Payer: Self-pay | Admitting: Emergency Medicine

## 2014-09-29 ENCOUNTER — Emergency Department: Payer: Medicare Other

## 2014-09-29 DIAGNOSIS — R0600 Dyspnea, unspecified: Secondary | ICD-10-CM | POA: Diagnosis not present

## 2014-09-29 DIAGNOSIS — R0602 Shortness of breath: Secondary | ICD-10-CM | POA: Diagnosis present

## 2014-09-29 DIAGNOSIS — Z79899 Other long term (current) drug therapy: Secondary | ICD-10-CM | POA: Diagnosis not present

## 2014-09-29 DIAGNOSIS — Z880 Allergy status to penicillin: Secondary | ICD-10-CM | POA: Insufficient documentation

## 2014-09-29 DIAGNOSIS — F1310 Sedative, hypnotic or anxiolytic abuse, uncomplicated: Secondary | ICD-10-CM | POA: Diagnosis not present

## 2014-09-29 DIAGNOSIS — I10 Essential (primary) hypertension: Secondary | ICD-10-CM | POA: Insufficient documentation

## 2014-09-29 DIAGNOSIS — F419 Anxiety disorder, unspecified: Secondary | ICD-10-CM | POA: Diagnosis not present

## 2014-09-29 DIAGNOSIS — Z720 Tobacco use: Secondary | ICD-10-CM | POA: Insufficient documentation

## 2014-09-29 DIAGNOSIS — Z7951 Long term (current) use of inhaled steroids: Secondary | ICD-10-CM | POA: Diagnosis not present

## 2014-09-29 DIAGNOSIS — F1110 Opioid abuse, uncomplicated: Secondary | ICD-10-CM | POA: Diagnosis not present

## 2014-09-29 LAB — COMPREHENSIVE METABOLIC PANEL
ALT: 17 U/L (ref 14–54)
ANION GAP: 9 (ref 5–15)
AST: 28 U/L (ref 15–41)
Albumin: 4.8 g/dL (ref 3.5–5.0)
Alkaline Phosphatase: 105 U/L (ref 38–126)
BUN: 5 mg/dL — ABNORMAL LOW (ref 6–20)
CHLORIDE: 104 mmol/L (ref 101–111)
CO2: 26 mmol/L (ref 22–32)
CREATININE: 0.6 mg/dL (ref 0.44–1.00)
Calcium: 9.6 mg/dL (ref 8.9–10.3)
Glucose, Bld: 123 mg/dL — ABNORMAL HIGH (ref 65–99)
POTASSIUM: 3.8 mmol/L (ref 3.5–5.1)
SODIUM: 139 mmol/L (ref 135–145)
Total Bilirubin: 0.7 mg/dL (ref 0.3–1.2)
Total Protein: 7.8 g/dL (ref 6.5–8.1)

## 2014-09-29 LAB — CBC
HCT: 39.9 % (ref 35.0–47.0)
Hemoglobin: 14 g/dL (ref 12.0–16.0)
MCH: 31.9 pg (ref 26.0–34.0)
MCHC: 35.2 g/dL (ref 32.0–36.0)
MCV: 90.8 fL (ref 80.0–100.0)
PLATELETS: 342 10*3/uL (ref 150–440)
RBC: 4.39 MIL/uL (ref 3.80–5.20)
RDW: 12.8 % (ref 11.5–14.5)
WBC: 10.2 10*3/uL (ref 3.6–11.0)

## 2014-09-29 LAB — TROPONIN I

## 2014-09-29 LAB — MAGNESIUM: MAGNESIUM: 1.8 mg/dL (ref 1.7–2.4)

## 2014-09-29 LAB — URINE DRUG SCREEN, QUALITATIVE (ARMC ONLY)
AMPHETAMINES, UR SCREEN: NOT DETECTED
BARBITURATES, UR SCREEN: NOT DETECTED
BENZODIAZEPINE, UR SCRN: POSITIVE — AB
Cannabinoid 50 Ng, Ur ~~LOC~~: NOT DETECTED
Cocaine Metabolite,Ur ~~LOC~~: NOT DETECTED
MDMA (Ecstasy)Ur Screen: NOT DETECTED
METHADONE SCREEN, URINE: NOT DETECTED
OPIATE, UR SCREEN: POSITIVE — AB
PHENCYCLIDINE (PCP) UR S: NOT DETECTED
Tricyclic, Ur Screen: NOT DETECTED

## 2014-09-29 MED ORDER — ONDANSETRON 4 MG PO TBDP: 4.0000 mg | ORAL_TABLET | Freq: Three times a day (TID) | ORAL | Status: DC | PRN

## 2014-09-29 MED ORDER — ALPRAZOLAM 0.5 MG PO TABS
ORAL_TABLET | ORAL | Status: AC
  Administered 2014-09-29: 0.5 mg
  Filled 2014-09-29: qty 4

## 2014-09-29 MED ORDER — ALPRAZOLAM 0.5 MG PO TABS
ORAL_TABLET | ORAL | Status: AC
  Administered 2014-09-29: 0.5 mg via ORAL
  Filled 2014-09-29: qty 1

## 2014-09-29 MED ORDER — ALPRAZOLAM 0.5 MG PO TABS
0.5000 mg | ORAL_TABLET | Freq: Once | ORAL | Status: AC
  Administered 2014-09-29: 0.5 mg via ORAL

## 2014-09-29 MED ORDER — METHYLPREDNISOLONE SODIUM SUCC 125 MG IJ SOLR
125.0000 mg | Freq: Once | INTRAMUSCULAR | Status: AC
  Administered 2014-09-29: 125 mg via INTRAVENOUS
  Filled 2014-09-29: qty 2

## 2014-09-29 MED ORDER — OXYCODONE-ACETAMINOPHEN 5-325 MG PO TABS: 1.0000 | ORAL_TABLET | Freq: Four times a day (QID) | ORAL | Status: DC | PRN

## 2014-09-29 MED ORDER — MAGNESIUM SULFATE 2 GM/50ML IV SOLN
2.0000 g | Freq: Once | INTRAVENOUS | Status: AC
  Administered 2014-09-29: 2 g via INTRAVENOUS
  Filled 2014-09-29: qty 50

## 2014-09-29 NOTE — ED Notes (Signed)

## 2014-09-29 NOTE — ED Notes (Signed)
Pt interviewing with behavioral nurse. Cont unable to reach son for transport.

## 2014-09-29 NOTE — ED Notes (Signed)
Meal tray given 

## 2014-09-29 NOTE — ED Notes (Signed)
Pt laying in bed states has chronic back pain rates it 8/10 at this time.   No other complaints or distress noted, will continue to monitor.

## 2014-09-29 NOTE — ED Notes (Signed)
Pt denies pain, resp unlabored, states feels anxious, denies SI, tearful during MD reeval, med per order, await behavioral nurse eval.

## 2014-09-29 NOTE — ED Notes (Signed)
Report received from Amy RN. Patient care assumed. Patient/RN introduction complete. Will continue to monitor.  

## 2014-09-29 NOTE — ED Notes (Signed)
Patient resting comfortably in room. No complaints or concerns voiced. No distress or abnormal behavior noted. Will continue to monitor with security cameras. Q 15 minute rounds continue.

## 2014-09-29 NOTE — ED Notes (Signed)

## 2014-09-29 NOTE — ED Notes (Signed)
Patient waiting for consult with psychiatrist.  Maintained on 15 minute checks and observation by security camera for safety.

## 2014-09-29 NOTE — ED Notes (Addendum)
Pt a+o. Respirations even and unlabored. ABCs intact. Patient c/o blurry vision and numbness of the left side starting at 0300 this morning. Pts speech is clear. Pts grip equal on both sides. No pronator drift or sensory loss noted.

## 2014-09-29 NOTE — ED Provider Notes (Signed)
[facility] Emergency Department Provider Note  ____________________________________________  Time seen: Approximately 6:23 AM  I have reviewed the triage vital signs and the nursing notes.   HISTORY  Chief Complaint Shortness of Breath; Wheezing; and Panic Attack    HPI Tamara Mcclure is a 59 y.o. female who comes into the hospital today with shortness of breath. The patient has history of anxiety and called EMS with shortness of breath. The patient reports that she's had shortness of breath all day since 3 PM. She reports that she did not take anything for anxiety but took a Combivent treatment. She reports that it did not help with her symptoms. The patient was concerned so she decided to come in for evaluation. EMS reports that they did hear some wheezes so they gave the patient a DuoNeb. She does have a history of COPD and she did tell EMS that she felt improved. The patient is also anxious because she heard some news from her doctor that is very disturbing. The patient also reports that she's had some nausea and has not eaten much in the past week. She reports that she's had some lightheadedness as well. The patient is here for further evaluation of her symptoms. The patient reports that her face has felt numb on the left side for the past 3-4 days   Past Medical History  Diagnosis Date  . Anxiety   . Depression   . COPD (chronic obstructive pulmonary disease)   . Hypertension   . Panic disorder with agoraphobia and severe panic attacks   . Chronic lower back pain   . Peripheral sensory neuropathy     Patient Active Problem List   Diagnosis Date Noted  . Hypertension 09/02/2014  . Dry skin 09/02/2014  . Menopausal symptoms 08/05/2014  . Peripheral sensory neuropathy 07/10/2014  . Anxiety and depression 07/10/2014  . Airway hyperreactivity 07/10/2014  . At risk for falling 07/10/2014  . Congested 07/10/2014  . Lumbosacral spondylosis 07/10/2014  .  DDD (degenerative disc disease), lumbosacral 07/10/2014  . Discharge of ear 07/10/2014  . Degenerative arthritis of lumbar spine 07/10/2014  . Dyslipidemia 07/10/2014  . Dysfunction of eustachian tube 07/10/2014  . ANA positive 07/10/2014  . Hypomagnesemia 07/10/2014  . Hypokalemia 07/10/2014  . Cerumen impaction 07/10/2014  . Cramps of lower extremity 07/10/2014  . Leg swelling 07/10/2014  . Spasm 07/10/2014  . Compulsive tobacco user syndrome 07/10/2014  . Atrophy of vagina 07/10/2014  . Chronic radicular low back pain 06/11/2014  . Insomnia 06/11/2014  . Panic disorder with agoraphobia and severe panic attacks 06/11/2014  . Foot pain 12/17/2013  . Burning sensation of feet 12/17/2013  . Numbness and tingling 12/17/2013  . CAFL (chronic airflow limitation) 06/21/2011  . Clinical depression 06/21/2011    Past Surgical History  Procedure Laterality Date  . None      Current Outpatient Rx  Name  Route  Sig  Dispense  Refill  . alprazolam (XANAX) 2 MG tablet   Oral   Take 1 tablet (2 mg total) by mouth 3 (three) times daily as needed for anxiety.   90 tablet   0   . amLODipine (NORVASC) 10 MG tablet   Oral   Take 1 tablet (10 mg total) by mouth daily.   90 tablet   1   . carisoprodol (SOMA) 350 MG tablet   Oral   Take by mouth.         . fluticasone (FLONASE) 50 MCG/ACT nasal  spray   Each Nare   Place 2 sprays into both nostrils daily.   16 g   0   . Fluticasone-Salmeterol (ADVAIR) 250-50 MCG/DOSE AEPB   Inhalation   Inhale into the lungs.         Marland Kitchen HYDROcodone-acetaminophen (NORCO) 10-325 MG per tablet   Oral   Take 1 tablet by mouth every 6 (six) hours as needed.   120 tablet   0     Dispense as written.   Marland Kitchen ibuprofen (ADVIL,MOTRIN) 200 MG tablet   Oral   Take 200 mg by mouth every 6 (six) hours as needed.         . Ipratropium-Albuterol (COMBIVENT) 20-100 MCG/ACT AERS respimat   Inhalation   Inhale into the lungs.         . potassium  chloride (K-DUR,KLOR-CON) 10 MEQ tablet   Oral   Take 1 tablet (10 mEq total) by mouth daily.   90 tablet   0   . pregabalin (LYRICA) 75 MG capsule   Oral   Take 1 capsule (75 mg total) by mouth 3 (three) times daily.   90 capsule   0   . progesterone (PROMETRIUM) 200 MG capsule   Oral   Take 1 capsule (200 mg total) by mouth at bedtime.   30 capsule   3   . QUEtiapine (SEROQUEL) 100 MG tablet   Oral   Take 1 tablet (100 mg total) by mouth at bedtime.   30 tablet   2     Allergies Penicillins  Family History  Problem Relation Age of Onset  . Cancer Mother     Lung  . Cancer Father     pancreatic  . Breast cancer Neg Hx   . Ovarian cancer Neg Hx   . Colon cancer Neg Hx   . Diabetes Neg Hx   . Heart disease Neg Hx     Social History Social History  Substance Use Topics  . Smoking status: Current Every Day Smoker -- 1.00 packs/day for 30 years    Types: Cigarettes  . Smokeless tobacco: None  . Alcohol Use: No    Review of Systems Constitutional: No fever/chills Eyes: No visual changes. ENT: No sore throat. Cardiovascular:  chest pain. Respiratory:  shortness of breath. Gastrointestinal: Nausea with No abdominal pain, no vomiting.  No diarrhea.  No constipation. Genitourinary: Negative for dysuria. Musculoskeletal: Negative for back pain. Skin: Negative for rash. Neurological: Negative for headaches, focal weakness or numbness.  10-point ROS otherwise negative.  ____________________________________________   PHYSICAL EXAM:  VITAL SIGNS: ED Triage Vitals  Enc Vitals Group     BP 09/29/14 0628 125/95 mmHg     Pulse Rate 09/29/14 0628 96     Resp 09/29/14 0628 20     Temp 09/29/14 0628 98.2 F (36.8 C)     Temp Source 09/29/14 0628 Oral     SpO2 09/29/14 0628 100 %     Weight 09/29/14 0628 125 lb (56.7 kg)     Height 09/29/14 0628 5\' 3"  (1.6 m)     Head Cir --      Peak Flow --      Pain Score 09/29/14 0628 0     Pain Loc --      Pain  Edu? --      Excl. in [place]? --     Constitutional: Alert and oriented. Well appearing and in moderate distress. Eyes: Conjunctivae are normal. PERRL. EOMI. Head: Atraumatic. Nose: No  congestion/rhinnorhea. Mouth/Throat: Mucous membranes are moist.  Oropharynx non-erythematous. Cardiovascular: Normal rate, regular rhythm. Grossly normal heart sounds.  Good peripheral circulation. Respiratory: Normal respiratory effort.  No retractions. Lungs CTAB. Gastrointestinal: Soft and nontender. No distention positive bowel sounds Musculoskeletal: No lower extremity tenderness nor edema.   Neurologic:  Normal speech and language. No gross focal neurologic deficits are appreciated.  Skin:  Skin is warm, dry and intact.  Psychiatric: Mood and affect are normal.   ____________________________________________   LABS (all labs ordered are listed, but only abnormal results are displayed)  Labs Reviewed  COMPREHENSIVE METABOLIC PANEL - Abnormal; Notable for the following:    Glucose, Bld 123 (*)    BUN 5 (*)    All other components within normal limits  CBC  TROPONIN I  MAGNESIUM   ____________________________________________  EKG  ED ECG REPORT I, Loney Hering, the attending physician, personally viewed and interpreted this ECG.   Date: 09/29/2014  EKG Time: 637  Rate: 83  Rhythm: normal EKG, normal sinus rhythm  Axis: normal  Intervals:none  ST&T Change: none  ____________________________________________  RADIOLOGY  CXR: No active cardiopulmonary disease ____________________________________________   PROCEDURES  Procedure(s) performed: None  Critical Care performed: No  ____________________________________________   INITIAL IMPRESSION / ASSESSMENT AND PLAN / ED COURSE  Pertinent labs & imaging results that were available during my care of the patient were reviewed by me and considered in my medical decision making (see chart for details).  This is a 59 year old  female with a history of COPD who comes in with some shortness of breath and anxiety today. The patient also reports that she has been having some nausea whenever she eats. The patient does not have a pneumonia on CXR and her blood work is unremarkable. The patient will receive some solumedrol and magnesium and she will be reassessed by Dr. Cinda Quest. I will also have the patient receive a repeat troponin as she did have some chest pain earlier.  ____________________________________________   FINAL CLINICAL IMPRESSION(S) / ED DIAGNOSES  Final diagnoses:  Dyspnea      Loney Hering, MD 09/29/14 [id number]

## 2014-09-29 NOTE — ED Notes (Signed)
Patient anxious about diagnosis and discharge. She is having difficulty remembering her conversations with staff and physicians. When reminded, she denies what she said. Will continue to monitor mental status and maintain on all safety precautions.

## 2014-09-29 NOTE — ED Notes (Signed)

## 2014-09-29 NOTE — ED Provider Notes (Addendum)
Patient's labs and x-rays are normal. Patient now says she has a feeling like the left side of her face is caving in and she told the nurse she couldn't see out of her left eye which she told me her vision was just a little bit blurry patient says she can't hear out of her left ear initially and then she says she has trouble hearing out of her left ear but she can hear somewhat. She has scarring on the left ear on examination she reports is due to a ruptured eardrum sometime in the past. Patient then begins crying and says she just can't handle it anymore she says she's been in the hospital couple times for suicidal ideation. She doesn't want to go back downstairs to "that [place]" patient says she has a psychiatrist but she hasn't seen them because she couldn't afford the co-pay but [facility] couldn't see her because of that he didn't take her insurance. On examination patient's head is normal cephalic atraumatic patient has slightly disconjugate gaze which she can overcome. This is present on the picture on her driver's license. Patient's left TM has some scarring the right TM is clear. Neck is supple her neurological exam is normal cranial nerves II through XII are intact except as noted above hearing appears to be normal bilaterally on my examination at least speaking to her quietly on both sides of her head. Patient is very tremulous whether she is moving or not appears to be very anxious. Motor strength is 5 over 5 throughout she reports she has neuropathy in her feet which is unchanged. I cannot find any other sensory patient does not want to stay for any further testing does not want to go downstairs to see psychiatry. We'll give her [facility] for referral and let her go she wishes to a cannot find any definitive evidence of an organic process at this point.  Nena Polio, MD 09/29/14 1225 TTS comes in speaking with the patient. Patient says it is unable to provide TTS consult on with any reason for her to live  she feel she is a burden to be normal she has she told me attempted suicide in the past and actually been here before which is how she knows about the psych ward downstairs. TTS feels that patient could be a danger to herself and should be committed.  Nena Polio, MD 09/29/14 1302

## 2014-09-29 NOTE — ED Notes (Signed)
Pt A+O. Respirations even and unlabored. MD at bedside.

## 2014-09-29 NOTE — ED Notes (Signed)
Report to [facility]

## 2014-09-29 NOTE — ED Notes (Signed)
Phone call from patients' son, [place]. His phone number is [phone].

## 2014-09-29 NOTE — ED Notes (Signed)
Patient transported to X-ray 

## 2014-09-29 NOTE — ED Notes (Signed)
Patient assigned to appropriate care area. Patient oriented to unit/care area: Informed that, for their safety, care areas are designed for safety and monitored by security cameras at all times; and visiting hours explained to patient. Patient verbalizes understanding, and verbal contract for safety obtained. 

## 2014-09-29 NOTE — ED Notes (Signed)
EMS reports pt given Albuterol treatment and Duoneb in route for wheezing;

## 2014-09-29 NOTE — ED Notes (Signed)
IVC/ Consult Pending/Pending Placement

## 2014-09-29 NOTE — ED Notes (Signed)
Pts son came to ED. Son aware of IVC plan. Pt in hospital provided scrubs.

## 2014-09-29 NOTE — ED Notes (Signed)
Patient discharged to home per MD order. Patient in stable condition, and deemed medically cleared by ED provider for discharge. Discharge instructions reviewed with patient/family using "Teach Back"; verbalized understanding of medication education and administration, and information about follow-up care. Denies further concerns. ° °

## 2014-09-29 NOTE — ED Notes (Signed)
Unable to reach son for discharge.

## 2014-09-29 NOTE — ED Notes (Signed)

## 2014-09-29 NOTE — ED Notes (Signed)
Meal given

## 2014-09-29 NOTE — BH Assessment (Signed)
Assessment Note  Tamara Mcclure is an 59 y.o. female. Pt. presents with sever level of anxiety. Pt denied SI and HI throughout interview however, could not verbalize any reasons to continue living. Pt reports hx of depression and anxiety. Pt made several statements during assessment indicative of hopelessness and depression including- "I'm just stressing over everything", "nobody cares about my", "I'm just a burden". Pt stated that she no longer receives Pt endorses the following sxs of depression: isolation, worthlessness, tearfulness, despondence, guilt. Pt stated that she discontinued OPT tx due to financial issues. Pt stressed low levels of energy and stated that she could not perform ADLs, such as washing her hair, due to anxiety and energy levels. Pt was treated at [facility] approx..7 months for SI. When asked if inpatient admission was due to suicide attempt, Pt responded "they said I did, I don't know". Pt reports poor appetite and recent weight loss of 15lbs.  Pt referred to Psych MD for consult.   Axis I: See current hospital problem list  Past Medical History:  Past Medical History  Diagnosis Date  . Anxiety   . Depression   . COPD (chronic obstructive pulmonary disease)   . Hypertension   . Panic disorder with agoraphobia and severe panic attacks   . Chronic lower back pain   . Peripheral sensory neuropathy     Past Surgical History  Procedure Laterality Date  . None      Family History:  Family History  Problem Relation Age of Onset  . Cancer Mother     Lung  . Cancer Father     pancreatic  . Breast cancer Neg Hx   . Ovarian cancer Neg Hx   . Colon cancer Neg Hx   . Diabetes Neg Hx   . Heart disease Neg Hx     Social History:  reports that she has been smoking Cigarettes.  She has a 30 pack-year smoking history. She does not have any smokeless tobacco history on file. She reports that she does not drink alcohol or use illicit drugs.  Additional Social History:   Alcohol / Drug Use Pain Medications: None Reported Prescriptions: None Reported Over the Counter: None Reported History of alcohol / drug use?: No history of alcohol / drug abuse  CIWA: CIWA-Ar BP: 127/75 mmHg Pulse Rate: 94 COWS:    Allergies:  Allergies  Allergen Reactions  . Penicillins Rash    .penallergy    Home Medications:  (Not in a hospital admission)  OB/GYN Status:  No LMP recorded. Patient is postmenopausal.  General Assessment Data Location of Assessment: [facility] ED TTS Assessment: In system Is this a Tele or Face-to-Face Assessment?: Face-to-Face Is this an Initial Assessment or a Re-assessment for this encounter?: Initial Assessment Marital status: Divorced Tamara Mcclure Is patient pregnant?: No Pregnancy Status: No Living Arrangements: Alone Can pt return to current living arrangement?: Yes Admission Status: Involuntary Is patient capable of signing voluntary admission?: No Referral Source: Self/Family/Friend Insurance type: [place] Screening Exam ([place]) Medical Exam completed: Yes  Crisis Care Plan Living Arrangements: Alone Name of Psychiatrist: None  Name of Therapist: None  Education Status Is patient currently in school?: No Current Grade: N/A Highest grade of school patient has completed: 12th Name of school: N/A Contact person: Tamara (son)- No Contact #  Risk to self with the past 6 months Suicidal Ideation: No Has patient been a risk to self within the past 6 months prior to admission? :  No Suicidal Intent: No Has patient had any suicidal intent within the past 6 months prior to admission? : No Is patient at risk for suicide?: Yes Suicidal Plan?: No Has patient had any suicidal plan within the past 6 months prior to admission? : Other (comment) (unkown) Access to Means: No What has been your use of drugs/alcohol within the last 12 months?: None Reported Previous Attempts/Gestures: Yes How many times?:  1 Other Self Harm Risks: High levels of distress/anxiety, lack of coping skills, no supports identified, unable to verbalize reason for leaving Triggers for Past Attempts: Unknown Intentional Self Injurious Behavior: None Family Suicide History: No Recent stressful life event(s): Other (Comment), Financial Problems, Conflict (Comment) (Conflict with son) Persecutory voices/beliefs?: No Depression: Yes Depression Symptoms: Feeling worthless/self pity, Isolating, Tearfulness, Despondent Substance abuse history and/or treatment for substance abuse?: No Suicide prevention information given to non-admitted patients: Not applicable  Risk to Others within the past 6 months Homicidal Ideation: No Does patient have any lifetime risk of violence toward others beyond the six months prior to admission? : No Thoughts of Harm to Others: No Current Homicidal Intent: No Current Homicidal Plan: No Access to Homicidal Means: No Identified Victim: N/A History of harm to others?: No Assessment of Violence: None Noted Violent Behavior Description: N/A Does patient have access to weapons?: No Criminal Charges Pending?: No Does patient have a court date: No Is patient on probation?: No  Psychosis Hallucinations: None noted Delusions: None noted  Mental Status Report Appearance/Hygiene: Disheveled, In hospital gown Eye Contact: Poor Motor Activity: Restlessness Speech: Logical/coherent Level of Consciousness: Alert Mood: Anxious Affect: Anxious Anxiety Level: Severe Thought Processes: Coherent, Relevant Judgement: Unimpaired Orientation: Person, Place, Time, Situation Obsessive Compulsive Thoughts/Behaviors: None  Cognitive Functioning Concentration: Fair Memory: Recent Intact IQ: Average Insight: Poor Impulse Control: Fair Appetite: Poor Weight Loss: 15 ("10 or 20") Weight Gain: 0 Sleep: No Change Total Hours of Sleep: 8 Vegetative Symptoms: None  ADLScreening [facility] Assessment  Services) Patient's cognitive ability adequate to safely complete daily activities?: Yes Patient able to express need for assistance with ADLs?: Yes Independently performs ADLs?: Yes (appropriate for developmental age) (Able to perform however, reports difficulty performing ADLs due to anxiety and low energy levels)  Prior Inpatient Therapy Prior Inpatient Therapy: Yes Prior Therapy Dates: approx. 7 months ago Prior Therapy Facilty/Provider(s): [facility] Reason for Treatment: SI  Prior Outpatient Therapy Prior Outpatient Therapy: Yes Prior Therapy Dates: Not Provided Prior Therapy Facilty/Provider(s): [facility] Reason for Treatment: Depression, Anxiety Does patient have an ACCT team?: No Does patient have Intensive In-House Services?  : No Does patient have [facility] services? 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) Does the patient have difficulty seeing, even when wearing glasses/contacts?: No Does the patient have difficulty concentrating, remembering, or making decisions?: Yes Patient able to express need for assistance with ADLs?: Yes Does the patient have difficulty dressing or bathing?: Yes Independently performs ADLs?: Yes (appropriate for developmental age) (Able to perform however, reports difficulty performing ADLs due to anxiety and low energy levels) Does the patient have difficulty walking or climbing stairs?: Yes (Pt reports difficulty due to neuropathy & fear, Probation officer observed Pt abulating freely wiithout assistance) Weakness of Legs: Both  Home Assistive Devices/Equipment Home Assistive Devices/Equipment: None  Therapy Consults (therapy consults require a physician order) PT Evaluation  Needed: No OT Evalulation Needed: No SLP Evaluation Needed: No Abuse/Neglect  Assessment (Assessment to be complete while patient is alone) Physical Abuse: Denies Verbal Abuse: Denies Sexual Abuse: Denies Exploitation of patient/patient's resources: Denies Self-Neglect: Denies Values / Beliefs Cultural Requests During Hospitalization: None Spiritual Requests During Hospitalization: None Consults Spiritual Care Consult Needed: No Social Work Consult Needed: No Regulatory affairs officer (For Healthcare) Does patient have an advance directive?: No Would patient like information on creating an advanced directive?: No - patient declined information    Additional Information 1:1 In Past 12 Months?:  (Not reported) CIRT Risk: No Elopement Risk: Yes Does patient have medical clearance?: Yes     Disposition:  Disposition Initial Assessment Completed for this Encounter: Yes Disposition of Patient:  (Psych MD consult)  On Site Evaluation by:   Reviewed with Physician:    Fatima J Martinique 09/29/2014 6:25 PM

## 2014-09-29 NOTE — ED Notes (Signed)
Pt tearful and states she has anxiety regarding a message from doctor informing her that something was abnormal with her thyroid. Pt has not been able to get in touch with doctor to have this message explained to her. Pt is also upset regarding her conversation with her son in which he "called me stupid" for calling 911 this morning.

## 2014-10-01 ENCOUNTER — Telehealth: Payer: Self-pay | Admitting: Obstetrics and Gynecology

## 2014-10-01 NOTE — Telephone Encounter (Signed)
Pt aware mad out of office until 10/4.

## 2014-10-01 NOTE — Telephone Encounter (Signed)
The progesterone she is on makes her very groggy wants to see if she can take something else.

## 2014-10-06 ENCOUNTER — Encounter: Payer: Self-pay | Admitting: Family Medicine

## 2014-10-06 ENCOUNTER — Ambulatory Visit (INDEPENDENT_AMBULATORY_CARE_PROVIDER_SITE_OTHER): Payer: Medicare Other | Admitting: Family Medicine

## 2014-10-06 VITALS — BP 118/61 | HR 96 | Temp 98.4°F | Resp 18 | Ht 63.0 in | Wt 131.7 lb

## 2014-10-06 DIAGNOSIS — G8929 Other chronic pain: Secondary | ICD-10-CM | POA: Diagnosis not present

## 2014-10-06 DIAGNOSIS — G4700 Insomnia, unspecified: Secondary | ICD-10-CM | POA: Diagnosis not present

## 2014-10-06 DIAGNOSIS — E876 Hypokalemia: Secondary | ICD-10-CM

## 2014-10-06 DIAGNOSIS — G629 Polyneuropathy, unspecified: Secondary | ICD-10-CM | POA: Diagnosis not present

## 2014-10-06 DIAGNOSIS — R0981 Nasal congestion: Secondary | ICD-10-CM | POA: Diagnosis not present

## 2014-10-06 DIAGNOSIS — M5410 Radiculopathy, site unspecified: Secondary | ICD-10-CM

## 2014-10-06 DIAGNOSIS — F4001 Agoraphobia with panic disorder: Secondary | ICD-10-CM | POA: Diagnosis not present

## 2014-10-06 DIAGNOSIS — M5416 Radiculopathy, lumbar region: Principal | ICD-10-CM

## 2014-10-06 DIAGNOSIS — I10 Essential (primary) hypertension: Secondary | ICD-10-CM | POA: Diagnosis not present

## 2014-10-06 DIAGNOSIS — G608 Other hereditary and idiopathic neuropathies: Secondary | ICD-10-CM

## 2014-10-06 MED ORDER — HYDROCODONE-ACETAMINOPHEN 10-325 MG PO TABS: 1.0000 | ORAL_TABLET | Freq: Four times a day (QID) | ORAL | Status: DC | PRN

## 2014-10-06 MED ORDER — FLUTICASONE PROPIONATE 50 MCG/ACT NA SUSP: 2.0000 | Freq: Every day | NASAL | Status: DC

## 2014-10-06 MED ORDER — QUETIAPINE FUMARATE 100 MG PO TABS: 100.0000 mg | ORAL_TABLET | Freq: Every day | ORAL | Status: DC

## 2014-10-06 MED ORDER — POTASSIUM CHLORIDE CRYS ER 10 MEQ PO TBCR: 10.0000 meq | EXTENDED_RELEASE_TABLET | Freq: Every day | ORAL | Status: DC

## 2014-10-06 MED ORDER — PREGABALIN 75 MG PO CAPS: 75.0000 mg | ORAL_CAPSULE | Freq: Three times a day (TID) | ORAL | Status: DC

## 2014-10-06 MED ORDER — AMLODIPINE BESYLATE 10 MG PO TABS: 10.0000 mg | ORAL_TABLET | Freq: Every day | ORAL | Status: DC

## 2014-10-06 MED ORDER — ALPRAZOLAM 2 MG PO TABS: 2.0000 mg | ORAL_TABLET | Freq: Three times a day (TID) | ORAL | Status: DC | PRN

## 2014-10-06 NOTE — Progress Notes (Signed)
Name: Tamara Mcclure   MRN: [id number]    DOB: 1956/01/05   Date:10/06/2014       Progress Note  Subjective  Chief Complaint  Chief Complaint  Patient presents with  . Follow-up    1 mo.  . Medication Refill  . Hypertension  . Hyperlipidemia  . Depression    Back Pain This is a chronic problem. The pain is present in the lumbar spine. The quality of the pain is described as aching. The pain radiates to the left knee and right knee. The pain is at a severity of 8/10. Associated symptoms include headaches. Pertinent negatives include no bladder incontinence, bowel incontinence, chest pain or leg pain. Numbness: subjective tingling in her feet. She has tried analgesics and muscle relaxant for the symptoms.  Anxiety Presents for follow-up visit. Symptoms include dizziness, feeling of choking, insomnia, panic, restlessness and shortness of breath. Patient reports no chest pain or palpitations.   Her past medical history is significant for anxiety/panic attacks and depression. Past treatments include benzodiazephines.  Hypertension This is a chronic problem. The problem is controlled. Associated symptoms include anxiety, headaches and shortness of breath. Pertinent negatives include no blurred vision, chest pain or palpitations. Past treatments include calcium channel blockers. There is no history of CAD/MI or CVA.  Insomnia The symptoms are aggravated by anxiety and pain. Past treatments include medication. The treatment provided moderate relief. Typical bedtime:  8-10 P.M. (7 PM).     Past Medical History  Diagnosis Date  . Anxiety   . Depression   . COPD (chronic obstructive pulmonary disease)   . Hypertension   . Panic disorder with agoraphobia and severe panic attacks   . Chronic lower back pain   . Peripheral sensory neuropathy     Past Surgical History  Procedure Laterality Date  . None      Family History  Problem Relation Age of Onset  . Cancer Mother     Lung  .  Cancer Father     pancreatic  . Breast cancer Neg Hx   . Ovarian cancer Neg Hx   . Colon cancer Neg Hx   . Diabetes Neg Hx   . Heart disease Neg Hx     Social History   Social History  . Marital Status: Single    Spouse Name: N/A  . Number of Children: N/A  . Years of Education: N/A   Occupational History  . Not on file.   Social History Main Topics  . Smoking status: Current Every Day Smoker -- 1.00 packs/day for 30 years    Types: Cigarettes  . Smokeless tobacco: Not on file  . Alcohol Use: No  . Drug Use: No  . Sexual Activity: Not Currently   Other Topics Concern  . Not on file   Social History Narrative     Current outpatient prescriptions:  .  alprazolam (XANAX) 2 MG tablet, Take 1 tablet (2 mg total) by mouth 3 (three) times daily as needed for anxiety. (Patient taking differently: Take 2 mg by mouth 3 (three) times daily. ), Disp: 90 tablet, Rfl: 0 .  amLODipine (NORVASC) 10 MG tablet, Take 1 tablet (10 mg total) by mouth daily., Disp: 90 tablet, Rfl: 1 .  carisoprodol (SOMA) 350 MG tablet, Take 350 mg by mouth 3 (three) times daily. , Disp: , Rfl:  .  fluticasone (FLONASE) 50 MCG/ACT nasal spray, Place 2 sprays into both nostrils daily., Disp: 16 g, Rfl: 0 .  HYDROcodone-acetaminophen ([place])  10-325 MG per tablet, Take 1 tablet by mouth every 6 (six) hours as needed. (Patient taking differently: Take 1 tablet by mouth 4 (four) times daily. ), Disp: 120 tablet, Rfl: 0 .  ibuprofen (ADVIL,MOTRIN) 200 MG tablet, Take 200 mg by mouth every 6 (six) hours as needed for mild pain. , Disp: , Rfl:  .  Ipratropium-Albuterol (COMBIVENT) 20-100 MCG/ACT AERS respimat, Inhale 1 puff into the lungs every 6 (six) hours as needed for wheezing or shortness of breath. , Disp: , Rfl:  .  potassium chloride (K-DUR,KLOR-CON) 10 MEQ tablet, Take 1 tablet (10 mEq total) by mouth daily., Disp: 90 tablet, Rfl: 0 .  pregabalin (LYRICA) 75 MG capsule, Take 1 capsule (75 mg total) by mouth 3  (three) times daily., Disp: 90 capsule, Rfl: 0 .  QUEtiapine (SEROQUEL) 100 MG tablet, Take 1 tablet (100 mg total) by mouth at bedtime., Disp: 30 tablet, Rfl: 2  Allergies  Allergen Reactions  . Penicillins Rash    .penallergy   Review of Systems  Eyes: Negative for blurred vision.  Respiratory: Positive for shortness of breath.   Cardiovascular: Negative for chest pain and palpitations.  Gastrointestinal: Negative for bowel incontinence.  Genitourinary: Negative for bladder incontinence.  Musculoskeletal: Positive for back pain.  Neurological: Positive for dizziness and headaches. Numbness: subjective tingling in her feet.  Psychiatric/Behavioral: The patient has insomnia.     Objective  Filed Vitals:   10/06/14 1016  BP: 118/61  Pulse: 96  Temp: 98.4 F (36.9 C)  TempSrc: Oral  Resp: 18  Height: 5\' 3"  (1.6 m)  Weight: 131 lb 11.2 oz (59.739 kg)  SpO2: 95%   Physical Exam  Constitutional: She is oriented to person, place, and time and well-developed, well-nourished, and in no distress.  HENT:  Nose: Right sinus exhibits maxillary sinus tenderness and frontal sinus tenderness. Left sinus exhibits maxillary sinus tenderness and frontal sinus tenderness.  Mouth/Throat: No posterior oropharyngeal erythema.  Cardiovascular: Normal rate and regular rhythm.   Pulmonary/Chest: Effort normal and breath sounds normal.  Abdominal: Soft. Bowel sounds are normal. There is tenderness in the epigastric area, periumbilical area and left upper quadrant.  Musculoskeletal:       Lumbar back: She exhibits tenderness, pain and spasm.  Neurological: She is alert and oriented to person, place, and time.  Psychiatric: Affect and judgment normal.  Nursing note and vitals reviewed.   Assessment & Plan  1. Chronic radicular low back pain Pain is stable and relatively controlled on current opioid therapy. Patient aware of the dependence potential of opioids. She is taking the medication as  directed. Refills provided. - HYDROcodone-acetaminophen (NORCO) 10-325 MG per tablet; Take 1 tablet by mouth every 6 (six) hours as needed.  Dispense: 120 tablet; Refill: 0  2. Peripheral sensory neuropathy  - pregabalin (LYRICA) 75 MG capsule; Take 1 capsule (75 mg total) by mouth 3 (three) times daily.  Dispense: 90 capsule; Refill: 0  3. Panic disorder with agoraphobia and severe panic attacks Stable on current benzodiazepine therapy. Warts and information from recent ED visit reviewed. Patient does not wish to see her psychiatrist at present. She is aware of the dependence potential and the interactions of benzodiazepines. Refills provided - alprazolam (XANAX) 2 MG tablet; Take 1 tablet (2 mg total) by mouth 3 (three) times daily as needed for anxiety.  Dispense: 90 tablet; Refill: 0  4. Insomnia  - QUEtiapine (SEROQUEL) 100 MG tablet; Take 1 tablet (100 mg total) by mouth at bedtime.  Dispense: 30 tablet; Refill: 2  5. Essential hypertension  - amLODipine (NORVASC) 10 MG tablet; Take 1 tablet (10 mg total) by mouth daily.  Dispense: 90 tablet; Refill: 1  6. Hypokalemia  - potassium chloride (K-DUR,KLOR-CON) 10 MEQ tablet; Take 1 tablet (10 mEq total) by mouth daily.  Dispense: 90 tablet; Refill: 0  7. Nasal sinus congestion  - fluticasone (FLONASE) 50 MCG/ACT nasal spray; Place 2 sprays into both nostrils daily.  Dispense: 16 g; Refill: 0   Syed Asad A. [facility] 10/06/2014 11:00 AM

## 2014-10-13 NOTE — Telephone Encounter (Signed)
Pt aware per mad she needs an appt. States she will check her calendar and call back to set up an appt.

## 2014-11-03 ENCOUNTER — Ambulatory Visit (INDEPENDENT_AMBULATORY_CARE_PROVIDER_SITE_OTHER): Payer: Medicare Other | Admitting: Family Medicine

## 2014-11-03 ENCOUNTER — Encounter: Payer: Self-pay | Admitting: Family Medicine

## 2014-11-03 VITALS — BP 118/71 | HR 99 | Temp 98.7°F | Resp 18 | Ht 63.0 in | Wt 134.6 lb

## 2014-11-03 DIAGNOSIS — J449 Chronic obstructive pulmonary disease, unspecified: Secondary | ICD-10-CM | POA: Diagnosis not present

## 2014-11-03 DIAGNOSIS — R0981 Nasal congestion: Secondary | ICD-10-CM | POA: Diagnosis not present

## 2014-11-03 DIAGNOSIS — G8929 Other chronic pain: Secondary | ICD-10-CM | POA: Diagnosis not present

## 2014-11-03 DIAGNOSIS — M7989 Other specified soft tissue disorders: Secondary | ICD-10-CM

## 2014-11-03 DIAGNOSIS — M799 Soft tissue disorder, unspecified: Secondary | ICD-10-CM

## 2014-11-03 DIAGNOSIS — G629 Polyneuropathy, unspecified: Secondary | ICD-10-CM | POA: Diagnosis not present

## 2014-11-03 DIAGNOSIS — M5410 Radiculopathy, site unspecified: Secondary | ICD-10-CM

## 2014-11-03 DIAGNOSIS — M5416 Radiculopathy, lumbar region: Secondary | ICD-10-CM

## 2014-11-03 DIAGNOSIS — G608 Other hereditary and idiopathic neuropathies: Secondary | ICD-10-CM

## 2014-11-03 DIAGNOSIS — F4001 Agoraphobia with panic disorder: Secondary | ICD-10-CM | POA: Diagnosis not present

## 2014-11-03 MED ORDER — IPRATROPIUM-ALBUTEROL 20-100 MCG/ACT IN AERS: 1.0000 | INHALATION_SPRAY | Freq: Four times a day (QID) | RESPIRATORY_TRACT | Status: DC | PRN

## 2014-11-03 MED ORDER — HYDROCODONE-ACETAMINOPHEN 10-325 MG PO TABS: 1.0000 | ORAL_TABLET | Freq: Four times a day (QID) | ORAL | Status: DC | PRN

## 2014-11-03 MED ORDER — ALPRAZOLAM 2 MG PO TABS: 2.0000 mg | ORAL_TABLET | Freq: Three times a day (TID) | ORAL | Status: DC | PRN

## 2014-11-03 MED ORDER — FLUTICASONE PROPIONATE 50 MCG/ACT NA SUSP: 2.0000 | Freq: Every day | NASAL | Status: DC

## 2014-11-03 MED ORDER — PREGABALIN 75 MG PO CAPS: 75.0000 mg | ORAL_CAPSULE | Freq: Three times a day (TID) | ORAL | Status: DC

## 2014-11-03 NOTE — Progress Notes (Signed)
Name: Tamara Mcclure   MRN: [id number]    DOB: 20-Jan-1955   Date:11/03/2014       Progress Note  Subjective  Chief Complaint  Chief Complaint  Patient presents with  . Follow-up    1 mo/ knot on back for a couple of weeks  . Hypertension  . Hyperlipidemia  . Medication Refill    alprazolam 2mg  / hydrocodone / lyrica 75mg     Back Pain This is a chronic problem. The pain is present in the lumbar spine. The pain radiates to the left thigh and right thigh. The pain is at a severity of 7/10. The pain is moderate. The symptoms are aggravated by bending and position. Associated symptoms include numbness (numbness in leg and feet, peripheral neuropathy.). Pertinent negatives include no chest pain or fever.  Anxiety Presents for follow-up visit. Symptoms include excessive worry, insomnia, irritability, nervous/anxious behavior, panic and shortness of breath. Patient reports no chest pain or suicidal ideas.   Past treatments include benzodiazephines. Compliance with prior treatments has been good.  COPD Pt. Is requesting a refill for Combivent for COPD. Active Smoker (currently 1 PPD X 45 years). She has no shortness of breath but has chronic nasal congestion. Last 2 weeks, she has experienced some shortness of breath and congestion.   Past Medical History  Diagnosis Date  . Anxiety   . Depression   . COPD (chronic obstructive pulmonary disease) ([place])   . Hypertension   . Panic disorder with agoraphobia and severe panic attacks   . Chronic lower back pain   . Peripheral sensory neuropathy [facility])     Past Surgical History  Procedure Laterality Date  . None      Family History  Problem Relation Age of Onset  . Cancer Mother     Lung  . Cancer Father     pancreatic  . Breast cancer Neg Hx   . Ovarian cancer Neg Hx   . Colon cancer Neg Hx   . Diabetes Neg Hx   . Heart disease Neg Hx     Social History   Social History  . Marital Status: Single    Spouse Name: N/A  .  Number of Children: N/A  . Years of Education: N/A   Occupational History  . Not on file.   Social History Main Topics  . Smoking status: Current Every Day Smoker -- 1.00 packs/day for 30 years    Types: Cigarettes  . Smokeless tobacco: Not on file  . Alcohol Use: No  . Drug Use: No  . Sexual Activity: Not Currently   Other Topics Concern  . Not on file   Social History Narrative     Current outpatient prescriptions:  .  alprazolam (XANAX) 2 MG tablet, Take 1 tablet (2 mg total) by mouth 3 (three) times daily as needed for anxiety., Disp: 90 tablet, Rfl: 0 .  amLODipine (NORVASC) 10 MG tablet, Take 1 tablet (10 mg total) by mouth daily., Disp: 90 tablet, Rfl: 1 .  carisoprodol (SOMA) 350 MG tablet, Take 350 mg by mouth 3 (three) times daily. , Disp: , Rfl:  .  fluticasone (FLONASE) 50 MCG/ACT nasal spray, Place 2 sprays into both nostrils daily., Disp: 16 g, Rfl: 0 .  HYDROcodone-acetaminophen (NORCO) 10-325 MG per tablet, Take 1 tablet by mouth every 6 (six) hours as needed., Disp: 120 tablet, Rfl: 0 .  ibuprofen (ADVIL,MOTRIN) 200 MG tablet, Take 200 mg by mouth every 6 (six) hours as needed for mild  pain. , Disp: , Rfl:  .  Ipratropium-Albuterol (COMBIVENT) 20-100 MCG/ACT AERS respimat, Inhale 1 puff into the lungs every 6 (six) hours as needed for wheezing or shortness of breath. , Disp: , Rfl:  .  potassium chloride (K-DUR,KLOR-CON) 10 MEQ tablet, Take 1 tablet (10 mEq total) by mouth daily., Disp: 90 tablet, Rfl: 0 .  pregabalin (LYRICA) 75 MG capsule, Take 1 capsule (75 mg total) by mouth 3 (three) times daily., Disp: 90 capsule, Rfl: 0 .  QUEtiapine (SEROQUEL) 100 MG tablet, Take 1 tablet (100 mg total) by mouth at bedtime., Disp: 30 tablet, Rfl: 2  Allergies  Allergen Reactions  . Penicillins Rash    .penallergy     Review of Systems  Constitutional: Positive for irritability. Negative for fever and chills.  HENT: Positive for congestion. Negative for nosebleeds.    Respiratory: Positive for cough, shortness of breath and wheezing. Negative for sputum production.   Cardiovascular: Negative for chest pain.  Musculoskeletal: Positive for back pain.  Neurological: Positive for numbness (numbness in leg and feet, peripheral neuropathy.).  Psychiatric/Behavioral: Positive for depression. Negative for suicidal ideas. The patient is nervous/anxious and has insomnia.    Objective  Filed Vitals:   11/03/14 0944  BP: 118/71  Pulse: 99  Temp: 98.7 F (37.1 C)  TempSrc: Oral  Resp: 18  Height: 5\' 3"  (1.6 m)  Weight: 134 lb 9.6 oz (61.054 kg)  SpO2: 98%    Physical Exam  Constitutional: She is oriented to person, place, and time and well-developed, well-nourished, and in no distress.  HENT:  Nose: No rhinorrhea or sinus tenderness.  No swelling or turbinate hypertrophy. Whitish mucus visualized.  Cardiovascular: Normal rate, regular rhythm and normal heart sounds.   No murmur heard. Pulmonary/Chest: Effort normal. No respiratory distress. She has wheezes. She has no rales.  Musculoskeletal:       Lumbar back: She exhibits tenderness, pain and spasm.  generalized tenderness to palpation over the transverse lower back and sacral area, muscle spasm elicited on palpation.  1 cm mobile non-tender subcutaneous mass at the base of lumbar spine.  Neurological: She is alert and oriented to person, place, and time.  Nursing note and vitals reviewed.  Assessment & Plan  1. Peripheral sensory neuropathy (HCC)  - pregabalin (LYRICA) 75 MG capsule; Take 1 capsule (75 mg total) by mouth 3 (three) times daily.  Dispense: 90 capsule; Refill: 0  2. Chronic radicular low back pain Chronic low back pain stable on daily opioid therapy. Patient compliant with controlled substances agreement. Aware of the dependence potential and drug interactions between opioids and benzodiazepines. Refills provided. Follow-up in one month. - HYDROcodone-acetaminophen (NORCO) 10-325  MG tablet; Take 1 tablet by mouth every 6 (six) hours as needed.  Dispense: 120 tablet; Refill: 0  3. Chronic obstructive pulmonary disease, unspecified COPD type ([place])  - Ipratropium-Albuterol (COMBIVENT) 20-100 MCG/ACT AERS respimat; Inhale 1 puff into the lungs every 6 (six) hours as needed for wheezing or shortness of breath.  Dispense: 1 Inhaler; Refill: 2  4. Nasal sinus congestion  - fluticasone (FLONASE) 50 MCG/ACT nasal spray; Place 2 sprays into both nostrils daily.  Dispense: 16 g; Refill: 0  5. Panic disorder with agoraphobia and severe panic attacks Patient compliant with controlled substances agreement. Aware of the dependence potential of benzodiazepines. Refills provided. Follow-up in one month. - alprazolam (XANAX) 2 MG tablet; Take 1 tablet (2 mg total) by mouth 3 (three) times daily as needed for anxiety.  Dispense: 90  tablet; Refill: 0  6. Mass of soft tissue Likely a small lipoma. Obtain ultrasound for evaluation. - [place] Misc Soft Tissue; Future   Walther Sanagustin Asad A. [facility] 11/03/2014 10:45 AM

## 2014-11-05 ENCOUNTER — Telehealth: Payer: Self-pay | Admitting: Family Medicine

## 2014-11-05 NOTE — Telephone Encounter (Signed)
Pt got a cheese dog when she left the office Monday. She started feeling like she was having a heart attack. Her stomach and chest got to hurting. Now she can not eat anything for she feels blotted and stomach swollen and is belching.  Please advise.

## 2014-11-06 NOTE — Telephone Encounter (Signed)
Patient started having what appears to be symptoms of heartburn after eating a cheese dog which she had 3 days ago. Since then, she feels bloated. Has tried Zantac without relief. I have recommended trying OTC Nexium and if it does not improve her symptoms, she will return next week to discuss prescription strength PPI. Patient verbalized understanding with the plan.

## 2014-11-06 NOTE — Telephone Encounter (Signed)
Routed to Dr. Shah for advice  

## 2014-11-10 ENCOUNTER — Telehealth: Payer: Self-pay | Admitting: Family Medicine

## 2014-11-10 DIAGNOSIS — R140 Abdominal distension (gaseous): Secondary | ICD-10-CM

## 2014-11-10 NOTE — Telephone Encounter (Signed)
Spoke with Dr Manuella Ghazi last week and was told to try Nexium. States that it is not working: feels nauseated, bloated and stomach growl all the time. Please advise

## 2014-11-11 DIAGNOSIS — R140 Abdominal distension (gaseous): Secondary | ICD-10-CM | POA: Insufficient documentation

## 2014-11-11 MED ORDER — PANTOPRAZOLE SODIUM 40 MG PO TBEC: 40.0000 mg | DELAYED_RELEASE_TABLET | Freq: Every day | ORAL | Status: DC

## 2014-11-11 NOTE — Telephone Encounter (Signed)
Pt called back again today and needs a call back.

## 2014-11-11 NOTE — Telephone Encounter (Signed)
Can not eat, having abdominal pain and bloating. Has tried the Nexium and it is not working. Please call something else in to [facility]

## 2014-11-11 NOTE — Telephone Encounter (Signed)
Patient reports belching, bloating, nausea, occasional heartburn and loss of appetite. She has tried Nexium which has not helped. Starts having 'stomach' pain after eating seemingly bland foods like chicken salad or oatmeal. I have recommended that patient come in this week for evaluation and workup but patient cannot come in this week due to nonavailability of transportation. She is requesting medication until she can come in next week. We'll start on Protonix 40 mg daily. Prescription called into pharmacy.

## 2014-11-12 NOTE — Telephone Encounter (Signed)
Patient notified of script at pharmacy

## 2014-11-28 ENCOUNTER — Other Ambulatory Visit: Payer: Self-pay | Admitting: Family Medicine

## 2014-11-28 ENCOUNTER — Telehealth: Payer: Self-pay | Admitting: Family Medicine

## 2014-11-28 DIAGNOSIS — K219 Gastro-esophageal reflux disease without esophagitis: Secondary | ICD-10-CM

## 2014-11-28 MED ORDER — PANTOPRAZOLE SODIUM 40 MG PO TBEC: 40.0000 mg | DELAYED_RELEASE_TABLET | Freq: Every day | ORAL | Status: DC

## 2014-11-28 NOTE — Telephone Encounter (Signed)
Schendt reports that her grand daughter had the stomach bug on Monday, 11/24/2014 and by Wednesday, 11/26/2014, patient was experiencing nausea, reflux and burning in her chest. She is wondering if she could have stomach ulcers. She was prescribed Protonix 40 mg daily 2 weeks ago for symptoms of abdominal bloating and heartburn which seems to have helped. We will refill the prescription for Protonix for 15 days. Patient has a scheduled follow-up appointment in one week at which time we will consider referral to gastroenterology. When that if patient has gastroenteritis, the illness is usually from a virus and will run its course. Advised to stay hydrated. Patient verbalized understanding.

## 2014-11-28 NOTE — Telephone Encounter (Signed)
Pt states she had to call EMS out to her house this morning for stomach issues. Pt states she wants to know if something can be called in for fever and weakness. EMS told her they think she is having gastroenteritis. She did not get taken to the ER. Pt states she has an appt next week. There is no availabilities today.

## 2014-11-28 NOTE — Telephone Encounter (Signed)
Routed to Dr. Manuella Ghazi for Prescritpion

## 2014-12-03 ENCOUNTER — Encounter: Payer: Self-pay | Admitting: Family Medicine

## 2014-12-03 ENCOUNTER — Ambulatory Visit (INDEPENDENT_AMBULATORY_CARE_PROVIDER_SITE_OTHER): Payer: Medicare Other | Admitting: Family Medicine

## 2014-12-03 VITALS — BP 116/78 | HR 94 | Temp 97.7°F | Resp 16 | Wt 138.3 lb

## 2014-12-03 DIAGNOSIS — G8929 Other chronic pain: Secondary | ICD-10-CM

## 2014-12-03 DIAGNOSIS — M5410 Radiculopathy, site unspecified: Secondary | ICD-10-CM | POA: Diagnosis not present

## 2014-12-03 DIAGNOSIS — G629 Polyneuropathy, unspecified: Secondary | ICD-10-CM | POA: Diagnosis not present

## 2014-12-03 DIAGNOSIS — J449 Chronic obstructive pulmonary disease, unspecified: Secondary | ICD-10-CM

## 2014-12-03 DIAGNOSIS — F4001 Agoraphobia with panic disorder: Secondary | ICD-10-CM

## 2014-12-03 DIAGNOSIS — M5416 Radiculopathy, lumbar region: Secondary | ICD-10-CM

## 2014-12-03 DIAGNOSIS — G608 Other hereditary and idiopathic neuropathies: Secondary | ICD-10-CM

## 2014-12-03 DIAGNOSIS — R0981 Nasal congestion: Secondary | ICD-10-CM | POA: Diagnosis not present

## 2014-12-03 MED ORDER — FLUTICASONE-SALMETEROL 250-50 MCG/DOSE IN AEPB: 1.0000 | INHALATION_SPRAY | Freq: Two times a day (BID) | RESPIRATORY_TRACT | Status: DC

## 2014-12-03 MED ORDER — ALPRAZOLAM 2 MG PO TABS: 2.0000 mg | ORAL_TABLET | Freq: Three times a day (TID) | ORAL | Status: DC | PRN

## 2014-12-03 MED ORDER — FLUTICASONE PROPIONATE 50 MCG/ACT NA SUSP: 2.0000 | Freq: Every day | NASAL | Status: DC

## 2014-12-03 MED ORDER — HYDROCODONE-ACETAMINOPHEN 10-325 MG PO TABS: 1.0000 | ORAL_TABLET | Freq: Four times a day (QID) | ORAL | Status: DC | PRN

## 2014-12-03 MED ORDER — PREGABALIN 75 MG PO CAPS: 75.0000 mg | ORAL_CAPSULE | Freq: Three times a day (TID) | ORAL | Status: DC

## 2014-12-03 NOTE — Progress Notes (Signed)
Name: Tamara Mcclure   MRN: [id number]    DOB: [birth year]   Date:12/03/2014       Progress Note  Subjective  Chief Complaint  Chief Complaint  Patient presents with  . Follow-up    patient is here for her 27-month follow-up for her chronic low back pain  . Medication Refill    lyrica, alaprazolam, norco    Back Pain This is a chronic problem. The problem is unchanged. The pain is present in the lumbar spine. The pain radiates to the left thigh and right thigh. The pain is at a severity of 8/10. The pain is severe. The pain is the same all the time. The symptoms are aggravated by bending and position. Associated symptoms include numbness (numbness in leg and feet, peripheral neuropathy.) and tingling. Pertinent negatives include no bladder incontinence, bowel incontinence, chest pain or fever. She has tried analgesics for the symptoms.  Anxiety Presents for follow-up visit. The problem has been unchanged. Symptoms include depressed mood, excessive worry, insomnia, irritability, nervous/anxious behavior, panic and shortness of breath. Patient reports no chest pain or suicidal ideas.   Past treatments include benzodiazephines. The treatment provided moderate relief. Compliance with prior treatments has been good.    Past Medical History  Diagnosis Date  . Anxiety   . Depression   . COPD (chronic obstructive pulmonary disease) ([place])   . Hypertension   . Panic disorder with agoraphobia and severe panic attacks   . Chronic lower back pain   . Peripheral sensory neuropathy [facility])     Past Surgical History  Procedure Laterality Date  . None      Family History  Problem Relation Age of Onset  . Cancer Mother     Lung  . Cancer Father     pancreatic  . Breast cancer Neg Hx   . Ovarian cancer Neg Hx   . Colon cancer Neg Hx   . Diabetes Neg Hx   . Heart disease Neg Hx     Social History   Social History  . Marital Status: Single    Spouse Name: N/A  . Number of Children: N/A   . Years of Education: N/A   Occupational History  . Not on file.   Social History Main Topics  . Smoking status: Current Every Day Smoker -- 1.00 packs/day for 30 years    Types: Cigarettes  . Smokeless tobacco: Not on file  . Alcohol Use: No  . Drug Use: No  . Sexual Activity: Not Currently   Other Topics Concern  . Not on file   Social History Narrative     Current outpatient prescriptions:  .  alprazolam (XANAX) 2 MG tablet, Take 1 tablet (2 mg total) by mouth 3 (three) times daily as needed for anxiety., Disp: 90 tablet, Rfl: 0 .  amLODipine (NORVASC) 10 MG tablet, Take 1 tablet (10 mg total) by mouth daily., Disp: 90 tablet, Rfl: 1 .  carisoprodol (SOMA) 350 MG tablet, Take 350 mg by mouth 3 (three) times daily. , Disp: , Rfl:  .  fluticasone (FLONASE) 50 MCG/ACT nasal spray, Place 2 sprays into both nostrils daily., Disp: 16 g, Rfl: 0 .  HYDROcodone-acetaminophen (NORCO) 10-325 MG tablet, Take 1 tablet by mouth every 6 (six) hours as needed., Disp: 120 tablet, Rfl: 0 .  ibuprofen (ADVIL,MOTRIN) 200 MG tablet, Take 200 mg by mouth every 6 (six) hours as needed for mild pain. , Disp: , Rfl:  .  Ipratropium-Albuterol (COMBIVENT) 20-100 MCG/ACT  AERS respimat, Inhale 1 puff into the lungs every 6 (six) hours as needed for wheezing or shortness of breath., Disp: 1 Inhaler, Rfl: 2 .  pantoprazole (PROTONIX) 40 MG tablet, Take 1 tablet (40 mg total) by mouth daily., Disp: 15 tablet, Rfl: 0 .  potassium chloride (K-DUR,KLOR-CON) 10 MEQ tablet, Take 1 tablet (10 mEq total) by mouth daily., Disp: 90 tablet, Rfl: 0 .  pregabalin (LYRICA) 75 MG capsule, Take 1 capsule (75 mg total) by mouth 3 (three) times daily., Disp: 90 capsule, Rfl: 0 .  QUEtiapine (SEROQUEL) 100 MG tablet, Take 1 tablet (100 mg total) by mouth at bedtime., Disp: 30 tablet, Rfl: 2  Allergies  Allergen Reactions  . Penicillins Rash    .penallergy     Review of Systems  Constitutional: Positive for irritability.  Negative for fever.  Respiratory: Positive for shortness of breath.   Cardiovascular: Negative for chest pain.  Gastrointestinal: Negative for bowel incontinence.  Genitourinary: Negative for bladder incontinence.  Musculoskeletal: Positive for back pain.  Neurological: Positive for tingling and numbness (numbness in leg and feet, peripheral neuropathy.).  Psychiatric/Behavioral: Positive for depression. Negative for suicidal ideas. The patient is nervous/anxious and has insomnia.      Objective  Filed Vitals:   12/03/14 1025  BP: 116/78  Pulse: 94  Temp: 97.7 F (36.5 C)  TempSrc: Oral  Resp: 16  Weight: 138 lb 4.8 oz (62.732 kg)  SpO2: 97%    Physical Exam  Constitutional: She is oriented to person, place, and time and well-developed, well-nourished, and in no distress.  HENT:  Head: Normocephalic and atraumatic.  Nose: Right sinus exhibits maxillary sinus tenderness. Right sinus exhibits no frontal sinus tenderness. Left sinus exhibits maxillary sinus tenderness. Left sinus exhibits no frontal sinus tenderness.  Mouth/Throat: No posterior oropharyngeal erythema.  No swelling or turbinate hypertrophy. Whitish mucus visualized.  Cardiovascular: Normal rate, regular rhythm and normal heart sounds.   No murmur heard. Pulmonary/Chest: Effort normal. No respiratory distress. She has wheezes (diffuse coarse expiratory wheezing). She has no rales.  Musculoskeletal:       Right ankle: She exhibits normal range of motion and no swelling. No tenderness.       Left ankle: She exhibits normal range of motion and no swelling. No tenderness.       Lumbar back: She exhibits tenderness, pain and spasm.  generalized tenderness to palpation over the transverse lower back and sacral area, muscle spasm.  1 cm mobile non-tender subcutaneous mass at the base of lumbar spine.  Neurological: She is alert and oriented to person, place, and time.  Nursing note and vitals reviewed.  Assessment &  Plan  1. Chronic obstructive pulmonary disease, unspecified COPD type (HCC)  Persistent wheezing. Patient advised to quit smoking. She has been on Advair in the past and will restart on the same. Continue on Combivent.   - Fluticasone-Salmeterol (ADVAIR) 250-50 MCG/DOSE AEPB; Inhale 1 puff into the lungs every 12 (twelve) hours.  Dispense: 60 each; Refill: 0  2. Panic disorder with agoraphobia and severe panic attacks Stable. Refills for alprazolam provided. - alprazolam (XANAX) 2 MG tablet; Take 1 tablet (2 mg total) by mouth 3 (three) times daily as needed for anxiety.  Dispense: 90 tablet; Refill: 0  3. Chronic radicular low back pain Pain is stable and controlled on hydrocodone. Refills provided. Follow-up in one month - HYDROcodone-acetaminophen (NORCO) 10-325 MG tablet; Take 1 tablet by mouth every 6 (six) hours as needed.  Dispense: 120 tablet; Refill:  0  4. Peripheral sensory neuropathy (HCC) Symptoms responsive to therapy with Lyrica. Refills provided. - pregabalin (LYRICA) 75 MG capsule; Take 1 capsule (75 mg total) by mouth 3 (three) times daily.  Dispense: 90 capsule; Refill: 0  5. Nasal sinus congestion  - fluticasone (FLONASE) 50 MCG/ACT nasal spray; Place 2 sprays into both nostrils daily.  Dispense: 16 g; Refill: 0   Zakariya Knickerbocker Asad [facility] 12/03/2014 10:46 AM

## 2014-12-08 ENCOUNTER — Telehealth: Payer: Self-pay | Admitting: Family Medicine

## 2014-12-08 DIAGNOSIS — J019 Acute sinusitis, unspecified: Secondary | ICD-10-CM

## 2014-12-08 MED ORDER — DOXYCYCLINE HYCLATE 100 MG PO TABS: 100.0000 mg | ORAL_TABLET | Freq: Two times a day (BID) | ORAL | Status: DC

## 2014-12-08 NOTE — Telephone Encounter (Signed)
He shouldn't describes symptoms of congestion in lungs, head feels 'stuffed up', and feels like she is running a fever. We'll start on doxycycline 100 mg twice daily for treatment of probable sinusitis. She is advised to contact us if no clinical improvement within 48 hours. Verbalized agreement

## 2014-12-08 NOTE — Telephone Encounter (Signed)
Pt states she is not feeling any better and is requesting a antibitic to be called into [facility].

## 2014-12-08 NOTE — Telephone Encounter (Signed)
Routed to Dr. Shah for advice  

## 2014-12-15 ENCOUNTER — Other Ambulatory Visit: Payer: Self-pay | Admitting: Family Medicine

## 2014-12-25 ENCOUNTER — Ambulatory Visit (INDEPENDENT_AMBULATORY_CARE_PROVIDER_SITE_OTHER): Payer: Medicare Other | Admitting: Family Medicine

## 2014-12-25 ENCOUNTER — Encounter: Payer: Self-pay | Admitting: Family Medicine

## 2014-12-25 VITALS — BP 118/72 | HR 91 | Temp 98.5°F | Resp 14 | Ht 63.0 in | Wt 138.5 lb

## 2014-12-25 DIAGNOSIS — G8929 Other chronic pain: Secondary | ICD-10-CM

## 2014-12-25 DIAGNOSIS — L989 Disorder of the skin and subcutaneous tissue, unspecified: Secondary | ICD-10-CM | POA: Diagnosis not present

## 2014-12-25 DIAGNOSIS — G629 Polyneuropathy, unspecified: Secondary | ICD-10-CM

## 2014-12-25 DIAGNOSIS — E876 Hypokalemia: Secondary | ICD-10-CM | POA: Diagnosis not present

## 2014-12-25 DIAGNOSIS — G4700 Insomnia, unspecified: Secondary | ICD-10-CM | POA: Diagnosis not present

## 2014-12-25 DIAGNOSIS — M5410 Radiculopathy, site unspecified: Secondary | ICD-10-CM | POA: Diagnosis not present

## 2014-12-25 DIAGNOSIS — F4001 Agoraphobia with panic disorder: Secondary | ICD-10-CM

## 2014-12-25 DIAGNOSIS — G608 Other hereditary and idiopathic neuropathies: Secondary | ICD-10-CM

## 2014-12-25 DIAGNOSIS — M5416 Radiculopathy, lumbar region: Principal | ICD-10-CM

## 2014-12-25 MED ORDER — ALPRAZOLAM 2 MG PO TABS: 2.0000 mg | ORAL_TABLET | Freq: Three times a day (TID) | ORAL | Status: DC | PRN

## 2014-12-25 MED ORDER — PREGABALIN 75 MG PO CAPS: 75.0000 mg | ORAL_CAPSULE | Freq: Three times a day (TID) | ORAL | Status: DC

## 2014-12-25 MED ORDER — HYDROCODONE-ACETAMINOPHEN 10-325 MG PO TABS: 1.0000 | ORAL_TABLET | Freq: Four times a day (QID) | ORAL | Status: DC | PRN

## 2014-12-25 MED ORDER — QUETIAPINE FUMARATE 100 MG PO TABS: 100.0000 mg | ORAL_TABLET | Freq: Every day | ORAL | Status: DC

## 2014-12-25 MED ORDER — POTASSIUM CHLORIDE CRYS ER 10 MEQ PO TBCR: 10.0000 meq | EXTENDED_RELEASE_TABLET | Freq: Every day | ORAL | Status: DC

## 2014-12-25 NOTE — Progress Notes (Signed)
Name: Tamara Mcclure   MRN: [id number]    DOB: [birth year]   Date:12/25/2014       Progress Note  Subjective  Chief Complaint  Chief Complaint  Patient presents with  . Follow-up    1 mo  . Medication Refill  . Hypertension  . Hyperlipidemia    Back Pain This is a chronic problem. The problem is unchanged. The pain is present in the lumbar spine. The pain radiates to the left thigh and right thigh. The pain is at a severity of 8/10. The pain is severe. The pain is the same all the time. The symptoms are aggravated by bending and position. Associated symptoms include numbness (numbness in leg and feet, peripheral neuropathy.) and tingling. Pertinent negatives include no bladder incontinence, bowel incontinence, chest pain or fever. She has tried analgesics for the symptoms.  Anxiety Presents for follow-up visit. The problem has been unchanged. Symptoms include depressed mood, excessive worry, insomnia (With medication, she has no insomnia), irritability, nervous/anxious behavior and panic. Patient reports no chest pain, dizziness or suicidal ideas.   Past treatments include benzodiazephines. The treatment provided moderate relief. Compliance with prior treatments has been good.  Insomnia Primary symptoms: difficulty falling asleep, frequent awakening.  The onset quality is gradual. Past treatments include medication. Typical bedtime:  8-10 P.M. (7 PM).  How long after going to bed to you fall asleep: 15-30 minutes.   PMH includes: depression.  Skin Lesion Pt. Is here concerned about a lesion on her face on the left side of her nose. This has been present for a while (approx 6 mo), but in the last month or so, it has grown bigger, bleeds when she scratches it or rubs it. She is concerned that it may be cancer.   Past Medical History  Diagnosis Date  . Anxiety   . Depression   . COPD (chronic obstructive pulmonary disease) ([place])   . Hypertension   . Panic disorder with agoraphobia and  severe panic attacks   . Chronic lower back pain   . Peripheral sensory neuropathy [facility])     Past Surgical History  Procedure Laterality Date  . None      Family History  Problem Relation Age of Onset  . Cancer Mother     Lung  . Cancer Father     pancreatic  . Breast cancer Neg Hx   . Ovarian cancer Neg Hx   . Colon cancer Neg Hx   . Diabetes Neg Hx   . Heart disease Neg Hx     Social History   Social History  . Marital Status: Single    Spouse Name: N/A  . Number of Children: N/A  . Years of Education: N/A   Occupational History  . Not on file.   Social History Main Topics  . Smoking status: Current Every Day Smoker -- 1.00 packs/day for 30 years    Types: Cigarettes  . Smokeless tobacco: Not on file  . Alcohol Use: No  . Drug Use: No  . Sexual Activity: Not Currently   Other Topics Concern  . Not on file   Social History Narrative     Current outpatient prescriptions:  .  alprazolam (XANAX) 2 MG tablet, Take 1 tablet (2 mg total) by mouth 3 (three) times daily as needed for anxiety., Disp: 90 tablet, Rfl: 0 .  amLODipine (NORVASC) 10 MG tablet, Take 1 tablet (10 mg total) by mouth daily., Disp: 90 tablet, Rfl: 1 .  carisoprodol (  SOMA) 350 MG tablet, Take 350 mg by mouth 3 (three) times daily. , Disp: , Rfl:  .  fluticasone (FLONASE) 50 MCG/ACT nasal spray, Place 2 sprays into both nostrils daily., Disp: 16 g, Rfl: 0 .  Fluticasone-Salmeterol (ADVAIR) 250-50 MCG/DOSE AEPB, Inhale 1 puff into the lungs every 12 (twelve) hours., Disp: 60 each, Rfl: 0 .  HYDROcodone-acetaminophen (NORCO) 10-325 MG tablet, Take 1 tablet by mouth every 6 (six) hours as needed., Disp: 120 tablet, Rfl: 0 .  ibuprofen (ADVIL,MOTRIN) 200 MG tablet, Take 200 mg by mouth every 6 (six) hours as needed for mild pain. , Disp: , Rfl:  .  Ipratropium-Albuterol (COMBIVENT) 20-100 MCG/ACT AERS respimat, Inhale 1 puff into the lungs every 6 (six) hours as needed for wheezing or shortness of  breath., Disp: 1 Inhaler, Rfl: 2 .  pantoprazole (PROTONIX) 40 MG tablet, TAKE 1 TABLET BY MOUTH ONCE A DAY, Disp: 30 tablet, Rfl: 0 .  potassium chloride (K-DUR,KLOR-CON) 10 MEQ tablet, Take 1 tablet (10 mEq total) by mouth daily., Disp: 90 tablet, Rfl: 0 .  pregabalin (LYRICA) 75 MG capsule, Take 1 capsule (75 mg total) by mouth 3 (three) times daily., Disp: 90 capsule, Rfl: 0 .  QUEtiapine (SEROQUEL) 100 MG tablet, Take 1 tablet (100 mg total) by mouth at bedtime., Disp: 30 tablet, Rfl: 2  Allergies  Allergen Reactions  . Penicillins Rash    .penallergy     Review of Systems  Constitutional: Positive for irritability. Negative for fever and chills.  Cardiovascular: Negative for chest pain.  Gastrointestinal: Negative for bowel incontinence.  Genitourinary: Negative for bladder incontinence.  Musculoskeletal: Positive for back pain and joint pain.  Skin: Negative for itching and rash.  Neurological: Positive for tingling and numbness (numbness in leg and feet, peripheral neuropathy.). Negative for dizziness.  Psychiatric/Behavioral: Positive for depression. Negative for suicidal ideas. The patient is nervous/anxious and has insomnia (With medication, she has no insomnia).     Objective  Filed Vitals:   12/25/14 1029  BP: 118/72  Pulse: 91  Temp: 98.5 F (36.9 C)  TempSrc: Oral  Resp: 14  Height: 5\' 3"  (1.6 m)  Weight: 138 lb 8 oz (62.823 kg)  SpO2: 98%    Physical Exam  Constitutional: She is oriented to person, place, and time and well-developed, well-nourished, and in no distress.  Cardiovascular: Normal rate and regular rhythm.   Pulmonary/Chest: Effort normal. She has wheezes (diffuse expiratory wheezing). She has no rhonchi. She has no rales.  Musculoskeletal:       Lumbar back: She exhibits tenderness, pain and spasm.  Neurological: She is alert and oriented to person, place, and time.  Skin: Lesion and rash noted. Rash is papular.  Papular, dome-shaped reddish  lesion on the left side of nose. No bleeding at present  Nursing note and vitals reviewed.   Assessment & Plan  1. Chronic radicular low back pain  - HYDROcodone-acetaminophen (NORCO) 10-325 MG tablet; Take 1 tablet by mouth every 6 (six) hours as needed.  Dispense: 120 tablet; Refill: 0  2. Peripheral sensory neuropathy (HCC)  - pregabalin (LYRICA) 75 MG capsule; Take 1 capsule (75 mg total) by mouth 3 (three) times daily.  Dispense: 90 capsule; Refill: 0  3. Panic disorder with agoraphobia and severe panic attacks  - alprazolam (XANAX) 2 MG tablet; Take 1 tablet (2 mg total) by mouth 3 (three) times daily as needed for anxiety.  Dispense: 90 tablet; Refill: 0  4. Insomnia  - QUEtiapine (SEROQUEL) 100  MG tablet; Take 1 tablet (100 mg total) by mouth at bedtime.  Dispense: 30 tablet; Refill: 2  5. Hypokalemia  - potassium chloride (K-DUR,KLOR-CON) 10 MEQ tablet; Take 1 tablet (10 mEq total) by mouth daily.  Dispense: 90 tablet; Refill: 0  6. Lesion of skin of face  - Ambulatory referral to Dermatology   [facility] [place] 12/25/2014 10:47 AM

## 2014-12-29 ENCOUNTER — Ambulatory Visit: Payer: Medicare Other

## 2014-12-30 ENCOUNTER — Telehealth: Payer: Self-pay

## 2014-12-30 NOTE — Telephone Encounter (Signed)
Routed to Dr. Manuella Ghazi to sign ultrasound order

## 2015-01-01 ENCOUNTER — Ambulatory Visit: Payer: Medicare Other

## 2015-01-06 NOTE — Telephone Encounter (Signed)
I had ordered an ultrasound of soft tissue for evaluation of a mass on patient's lower back. Patient does not need an ultrasound for abdomen. Please confirm with radiology.

## 2015-01-14 ENCOUNTER — Ambulatory Visit: Admission: RE | Admit: 2015-01-14 | Payer: Medicare Other | Source: Ambulatory Visit

## 2015-01-14 ENCOUNTER — Telehealth: Payer: Self-pay | Admitting: Family Medicine

## 2015-01-14 NOTE — Telephone Encounter (Signed)
both feet and calves are swollen. It began a couple days ago however now she is having difficult time walking.

## 2015-01-14 NOTE — Telephone Encounter (Signed)
Routed to Dr. Shah for advice  

## 2015-01-15 ENCOUNTER — Telehealth: Payer: Self-pay

## 2015-01-15 NOTE — Telephone Encounter (Signed)
Patient declined influenza vaccine.

## 2015-01-16 ENCOUNTER — Other Ambulatory Visit: Payer: Self-pay | Admitting: Family Medicine

## 2015-01-16 DIAGNOSIS — M7989 Other specified soft tissue disorders: Secondary | ICD-10-CM

## 2015-01-17 NOTE — Telephone Encounter (Signed)
Inserted about feet and ankle swelling for one day on Monday 01/12/15. Thought it was a side effect of Lyrica and decrease her dosage from 3 times a day to twice a day, which resulted in resolution of swelling. Also concerned if swelling may be from low potassium. I have offered to have her potassium checked by a nurse visit this coming Monday 01/19/15. She has an appointment scheduled on Monday, 01/26/2015 and will follow-up at that time. Verbalized agreement.

## 2015-01-19 ENCOUNTER — Ambulatory Visit: Admission: RE | Admit: 2015-01-19 | Payer: Medicare Other | Source: Ambulatory Visit

## 2015-01-22 ENCOUNTER — Ambulatory Visit: Payer: Medicare Other

## 2015-01-26 ENCOUNTER — Other Ambulatory Visit: Payer: Self-pay | Admitting: Family Medicine

## 2015-01-26 ENCOUNTER — Ambulatory Visit (INDEPENDENT_AMBULATORY_CARE_PROVIDER_SITE_OTHER): Payer: Commercial Managed Care - HMO | Admitting: Family Medicine

## 2015-01-26 ENCOUNTER — Encounter: Payer: Self-pay | Admitting: Family Medicine

## 2015-01-26 VITALS — BP 118/62 | HR 96 | Temp 98.7°F | Resp 17 | Ht 63.0 in | Wt 139.9 lb

## 2015-01-26 DIAGNOSIS — F4001 Agoraphobia with panic disorder: Secondary | ICD-10-CM

## 2015-01-26 DIAGNOSIS — G629 Polyneuropathy, unspecified: Secondary | ICD-10-CM

## 2015-01-26 DIAGNOSIS — M5410 Radiculopathy, site unspecified: Secondary | ICD-10-CM | POA: Diagnosis not present

## 2015-01-26 DIAGNOSIS — G608 Other hereditary and idiopathic neuropathies: Secondary | ICD-10-CM

## 2015-01-26 DIAGNOSIS — G4700 Insomnia, unspecified: Secondary | ICD-10-CM | POA: Diagnosis not present

## 2015-01-26 DIAGNOSIS — G8929 Other chronic pain: Secondary | ICD-10-CM | POA: Diagnosis not present

## 2015-01-26 DIAGNOSIS — M5416 Radiculopathy, lumbar region: Principal | ICD-10-CM

## 2015-01-26 MED ORDER — PREGABALIN 75 MG PO CAPS: 75.0000 mg | ORAL_CAPSULE | Freq: Three times a day (TID) | ORAL | Status: DC

## 2015-01-26 MED ORDER — QUETIAPINE FUMARATE 100 MG PO TABS
100.0000 mg | ORAL_TABLET | Freq: Every day | ORAL | Status: DC
Start: 2015-01-26 — End: 2015-05-21

## 2015-01-26 MED ORDER — ALPRAZOLAM 2 MG PO TABS: 2.0000 mg | ORAL_TABLET | Freq: Three times a day (TID) | ORAL | Status: DC | PRN

## 2015-01-26 MED ORDER — HYDROCODONE-ACETAMINOPHEN 10-325 MG PO TABS: 1.0000 | ORAL_TABLET | Freq: Four times a day (QID) | ORAL | Status: DC | PRN

## 2015-01-26 NOTE — Progress Notes (Signed)
Name: Tamara Mcclure   MRN: [id number]    DOB: Aug 27, 1955   Date:01/26/2015       Progress Note  Subjective  Chief Complaint  Chief Complaint  Patient presents with  . Follow-up    1 mo  . Hypertension  . Hyperlipidemia  . Medication Refill    Back Pain This is a chronic problem. The problem is unchanged. The pain is present in the lumbar spine. The pain radiates to the left thigh and right thigh. The pain is at a severity of 8/10. The pain is severe. The pain is the same all the time. The symptoms are aggravated by bending and position. Associated symptoms include numbness (numbness in leg and feet, peripheral neuropathy.) and tingling. Pertinent negatives include no bladder incontinence, bowel incontinence or fever. She has tried analgesics and NSAIDs for the symptoms.  Anxiety Presents for follow-up visit. The problem has been unchanged. Symptoms include depressed mood, excessive worry, insomnia (With medication, she has no insomnia), irritability, nervous/anxious behavior and panic. Patient reports no dizziness or suicidal ideas.   Her past medical history is significant for anxiety/panic attacks. Past treatments include benzodiazephines. The treatment provided moderate relief. Compliance with prior treatments has been good.  Insomnia Primary symptoms: difficulty falling asleep, frequent awakening.  The onset quality is gradual. Past treatments include medication. Typical bedtime:  8-10 P.M. (7 PM).  How long after going to bed to you fall asleep: 15-30 minutes.   PMH includes: depression.    Past Medical History  Diagnosis Date  . Anxiety   . Depression   . COPD (chronic obstructive pulmonary disease) ([place])   . Hypertension   . Panic disorder with agoraphobia and severe panic attacks   . Chronic lower back pain   . Peripheral sensory neuropathy [facility])     Past Surgical History  Procedure Laterality Date  . None      Family History  Problem Relation Age of Onset  .  Cancer Mother     Lung  . Cancer Father     pancreatic  . Breast cancer Neg Hx   . Ovarian cancer Neg Hx   . Colon cancer Neg Hx   . Diabetes Neg Hx   . Heart disease Neg Hx     Social History   Social History  . Marital Status: Single    Spouse Name: N/A  . Number of Children: N/A  . Years of Education: N/A   Occupational History  . Not on file.   Social History Main Topics  . Smoking status: Current Every Day Smoker -- 1.00 packs/day for 30 years    Types: Cigarettes  . Smokeless tobacco: Not on file  . Alcohol Use: No  . Drug Use: No  . Sexual Activity: Not Currently   Other Topics Concern  . Not on file   Social History Narrative     Current outpatient prescriptions:  .  alprazolam (XANAX) 2 MG tablet, Take 1 tablet (2 mg total) by mouth 3 (three) times daily as needed for anxiety., Disp: 90 tablet, Rfl: 0 .  amLODipine (NORVASC) 10 MG tablet, Take 1 tablet (10 mg total) by mouth daily., Disp: 90 tablet, Rfl: 1 .  carisoprodol (SOMA) 350 MG tablet, Take 350 mg by mouth 3 (three) times daily. , Disp: , Rfl:  .  fluticasone (FLONASE) 50 MCG/ACT nasal spray, Place 2 sprays into both nostrils daily., Disp: 16 g, Rfl: 0 .  Fluticasone-Salmeterol (ADVAIR) 250-50 MCG/DOSE AEPB, Inhale 1 puff into  the lungs every 12 (twelve) hours., Disp: 60 each, Rfl: 0 .  HYDROcodone-acetaminophen (NORCO) 10-325 MG tablet, Take 1 tablet by mouth every 6 (six) hours as needed., Disp: 120 tablet, Rfl: 0 .  ibuprofen (ADVIL,MOTRIN) 200 MG tablet, Take 200 mg by mouth every 6 (six) hours as needed for mild pain. , Disp: , Rfl:  .  Ipratropium-Albuterol (COMBIVENT) 20-100 MCG/ACT AERS respimat, Inhale 1 puff into the lungs every 6 (six) hours as needed for wheezing or shortness of breath., Disp: 1 Inhaler, Rfl: 2 .  pantoprazole (PROTONIX) 40 MG tablet, TAKE 1 TABLET BY MOUTH ONCE A DAY, Disp: 30 tablet, Rfl: 0 .  potassium chloride (K-DUR,KLOR-CON) 10 MEQ tablet, Take 1 tablet (10 mEq total)  by mouth daily., Disp: 90 tablet, Rfl: 0 .  pregabalin (LYRICA) 75 MG capsule, Take 1 capsule (75 mg total) by mouth 3 (three) times daily., Disp: 90 capsule, Rfl: 0 .  QUEtiapine (SEROQUEL) 100 MG tablet, Take 1 tablet (100 mg total) by mouth at bedtime., Disp: 30 tablet, Rfl: 2  Allergies  Allergen Reactions  . Penicillins Rash    .penallergy    Review of Systems  Constitutional: Positive for irritability. Negative for fever.  Gastrointestinal: Negative for bowel incontinence.  Genitourinary: Negative for bladder incontinence.  Musculoskeletal: Positive for back pain.  Neurological: Positive for tingling and numbness (numbness in leg and feet, peripheral neuropathy.). Negative for dizziness.  Psychiatric/Behavioral: Positive for depression. Negative for suicidal ideas. The patient is nervous/anxious and has insomnia (With medication, she has no insomnia).     Objective  Filed Vitals:   01/26/15 1059  BP: 118/62  Pulse: 96  Temp: 98.7 F (37.1 C)  TempSrc: Oral  Resp: 17  Height: 5\' 3"  (1.6 m)  Weight: 139 lb 14.4 oz (63.458 kg)  SpO2: 96%    Physical Exam  Constitutional: She is oriented to person, place, and time and well-developed, well-nourished, and in no distress.  Cardiovascular: Normal rate and regular rhythm.   Pulmonary/Chest: Effort normal. She has wheezes (diffuse expiratory wheezing). She has no rhonchi. She has no rales.  Musculoskeletal:       Lumbar back: She exhibits tenderness, pain and spasm.       Back:  Neurological: She is alert and oriented to person, place, and time.  Skin: Lesion and rash noted. Rash is papular.  Papular, dome-shaped reddish lesion on the left side of nose. No bleeding at present  Nursing note and vitals reviewed.     Assessment & Plan  1. Chronic radicular low back pain Stable and relatively well controlled on oral opioid therapy. Compliant with controlled substances agreement. Refills provided. -  HYDROcodone-acetaminophen (NORCO) 10-325 MG tablet; Take 1 tablet by mouth every 6 (six) hours as needed.  Dispense: 120 tablet; Refill: 0  2. Panic disorder with agoraphobia and severe panic attacks Responsive to benzodiazepine therapy. Compliant with controlled substances agreement and aware of the dependence potential of benzodiazepines. - alprazolam (XANAX) 2 MG tablet; Take 1 tablet (2 mg total) by mouth 3 (three) times daily as needed for anxiety.  Dispense: 90 tablet; Refill: 0  3. Peripheral sensory neuropathy (HCC)  - pregabalin (LYRICA) 75 MG capsule; Take 1 capsule (75 mg total) by mouth 3 (three) times daily.  Dispense: 90 capsule; Refill: 0  4. Insomnia - QUEtiapine (SEROQUEL) 100 MG tablet; Take 1 tablet (100 mg total) by mouth at bedtime.  Dispense: 30 tablet; Refill: 2   Mercadies Co Asad A. [facility] 01/26/2015 11:15 AM

## 2015-01-29 ENCOUNTER — Telehealth: Payer: Self-pay

## 2015-01-29 NOTE — Telephone Encounter (Signed)
Patinet called still having swelling in feet and ankle, she states talked with you about at last appointment, does not know what to do? Please advise

## 2015-02-03 NOTE — Telephone Encounter (Signed)
Please have patient schedule an office visit appointment to evaluate her symptoms.

## 2015-02-04 ENCOUNTER — Telehealth: Payer: Self-pay

## 2015-02-04 DIAGNOSIS — G629 Polyneuropathy, unspecified: Principal | ICD-10-CM

## 2015-02-04 DIAGNOSIS — G608 Other hereditary and idiopathic neuropathies: Secondary | ICD-10-CM

## 2015-02-04 NOTE — Telephone Encounter (Signed)
Patient states she would call back to make an appointment

## 2015-02-04 NOTE — Telephone Encounter (Signed)
Spoke with patient. She is experiencing increased burning in her feet, lower leg swelling. She is on Lyrica for neuropathy. Requesting referral to neurology.

## 2015-02-04 NOTE — Telephone Encounter (Signed)
Routed to Dr. Shah for referral 

## 2015-02-24 ENCOUNTER — Ambulatory Visit (INDEPENDENT_AMBULATORY_CARE_PROVIDER_SITE_OTHER): Payer: Commercial Managed Care - HMO | Admitting: Family Medicine

## 2015-02-24 ENCOUNTER — Encounter: Payer: Self-pay | Admitting: Family Medicine

## 2015-02-24 VITALS — BP 120/71 | HR 91 | Temp 97.9°F | Resp 20 | Ht 63.0 in | Wt 136.7 lb

## 2015-02-24 DIAGNOSIS — M5410 Radiculopathy, site unspecified: Secondary | ICD-10-CM | POA: Diagnosis not present

## 2015-02-24 DIAGNOSIS — G608 Other hereditary and idiopathic neuropathies: Secondary | ICD-10-CM

## 2015-02-24 DIAGNOSIS — F4001 Agoraphobia with panic disorder: Secondary | ICD-10-CM

## 2015-02-24 DIAGNOSIS — J441 Chronic obstructive pulmonary disease with (acute) exacerbation: Secondary | ICD-10-CM | POA: Diagnosis not present

## 2015-02-24 DIAGNOSIS — M5416 Radiculopathy, lumbar region: Secondary | ICD-10-CM

## 2015-02-24 DIAGNOSIS — G629 Polyneuropathy, unspecified: Secondary | ICD-10-CM | POA: Diagnosis not present

## 2015-02-24 DIAGNOSIS — G8929 Other chronic pain: Secondary | ICD-10-CM | POA: Diagnosis not present

## 2015-02-24 MED ORDER — DOXYCYCLINE HYCLATE 100 MG PO TABS
100.0000 mg | ORAL_TABLET | Freq: Two times a day (BID) | ORAL | Status: DC
Start: 2015-02-24 — End: 2015-05-21

## 2015-02-24 MED ORDER — ALPRAZOLAM 2 MG PO TABS: 2.0000 mg | ORAL_TABLET | Freq: Three times a day (TID) | ORAL | Status: DC | PRN

## 2015-02-24 MED ORDER — PREGABALIN 75 MG PO CAPS: 75.0000 mg | ORAL_CAPSULE | Freq: Three times a day (TID) | ORAL | Status: DC

## 2015-02-24 MED ORDER — PREDNISONE 10 MG (21) PO TBPK: 10.0000 mg | ORAL_TABLET | Freq: Every day | ORAL | Status: DC

## 2015-02-24 MED ORDER — HYDROCODONE-ACETAMINOPHEN 10-325 MG PO TABS: 1.0000 | ORAL_TABLET | Freq: Four times a day (QID) | ORAL | Status: DC | PRN

## 2015-02-24 MED ORDER — ALBUTEROL SULFATE HFA 108 (90 BASE) MCG/ACT IN AERS: 2.0000 | INHALATION_SPRAY | Freq: Four times a day (QID) | RESPIRATORY_TRACT | Status: DC | PRN

## 2015-02-24 NOTE — Progress Notes (Signed)
Name: Tamara Mcclure   MRN: [id number]    DOB: 05/16/1955   Date:02/24/2015       Progress Note  Subjective  Chief Complaint  Chief Complaint  Patient presents with  . Follow-up    1 mo  . Medication Refill    xanax 2 mg / hydrocodone / lyrica 75 mg     Back Pain This is a chronic problem. The pain is present in the lumbar spine. The pain is at a severity of 8/10. The symptoms are aggravated by bending, sitting and standing. Pertinent negatives include no fever.  Anxiety Presents for follow-up visit. Symptoms include excessive worry, insomnia, nervous/anxious behavior and panic. Patient reports no shortness of breath. The severity of symptoms is severe and causing significant distress.   Past treatments include benzodiazephines. The treatment provided moderate relief. Compliance with prior treatments has been good.  Cough This is a new problem. The problem has been unchanged. The cough is productive of sputum. Associated symptoms include ear congestion and wheezing. Pertinent negatives include no chills, fever, sore throat or shortness of breath. Her past medical history is significant for COPD.  Sinusitis This is a new problem. There has been no fever. Associated symptoms include congestion, coughing and sinus pressure. Pertinent negatives include no chills, shortness of breath or sore throat. Past treatments include oral decongestants.     Past Medical History  Diagnosis Date  . Anxiety   . Depression   . COPD (chronic obstructive pulmonary disease) ([place])   . Hypertension   . Panic disorder with agoraphobia and severe panic attacks   . Chronic lower back pain   . Peripheral sensory neuropathy [facility])     Past Surgical History  Procedure Laterality Date  . None      Family History  Problem Relation Age of Onset  . Cancer Mother     Lung  . Cancer Father     pancreatic  . Breast cancer Neg Hx   . Ovarian cancer Neg Hx   . Colon cancer Neg Hx   . Diabetes Neg Hx   .  Heart disease Neg Hx     Social History   Social History  . Marital Status: Single    Spouse Name: N/A  . Number of Children: N/A  . Years of Education: N/A   Occupational History  . Not on file.   Social History Main Topics  . Smoking status: Current Every Day Smoker -- 1.00 packs/day for 30 years    Types: Cigarettes  . Smokeless tobacco: Not on file  . Alcohol Use: No  . Drug Use: No  . Sexual Activity: Not Currently   Other Topics Concern  . Not on file   Social History Narrative     Current outpatient prescriptions:  .  alprazolam (XANAX) 2 MG tablet, Take 1 tablet (2 mg total) by mouth 3 (three) times daily as needed for anxiety., Disp: 90 tablet, Rfl: 0 .  amLODipine (NORVASC) 10 MG tablet, Take 1 tablet (10 mg total) by mouth daily., Disp: 90 tablet, Rfl: 1 .  carisoprodol (SOMA) 350 MG tablet, Take 350 mg by mouth 3 (three) times daily. , Disp: , Rfl:  .  fluticasone (FLONASE) 50 MCG/ACT nasal spray, USE 2 SPRAYS IN EACH NOSTRILS ONCE A DAY, Disp: 16 g, Rfl: 2 .  Fluticasone-Salmeterol (ADVAIR) 250-50 MCG/DOSE AEPB, Inhale 1 puff into the lungs every 12 (twelve) hours., Disp: 60 each, Rfl: 0 .  HYDROcodone-acetaminophen (NORCO) 10-325 MG tablet, Take  1 tablet by mouth every 6 (six) hours as needed., Disp: 120 tablet, Rfl: 0 .  ibuprofen (ADVIL,MOTRIN) 200 MG tablet, Take 200 mg by mouth every 6 (six) hours as needed for mild pain. , Disp: , Rfl:  .  Ipratropium-Albuterol (COMBIVENT) 20-100 MCG/ACT AERS respimat, Inhale 1 puff into the lungs every 6 (six) hours as needed for wheezing or shortness of breath., Disp: 1 Inhaler, Rfl: 2 .  pantoprazole (PROTONIX) 40 MG tablet, TAKE 1 TABLET BY MOUTH ONCE A DAY, Disp: 30 tablet, Rfl: 0 .  potassium chloride (K-DUR,KLOR-CON) 10 MEQ tablet, Take 1 tablet (10 mEq total) by mouth daily., Disp: 90 tablet, Rfl: 0 .  pregabalin (LYRICA) 75 MG capsule, Take 1 capsule (75 mg total) by mouth 3 (three) times daily., Disp: 90 capsule,  Rfl: 0 .  QUEtiapine (SEROQUEL) 100 MG tablet, Take 1 tablet (100 mg total) by mouth at bedtime., Disp: 30 tablet, Rfl: 2  Allergies  Allergen Reactions  . Penicillins Rash    .penallergy     Review of Systems  Constitutional: Negative for fever and chills.  HENT: Positive for congestion and sinus pressure. Negative for ear discharge and sore throat.   Respiratory: Positive for cough, sputum production and wheezing. Negative for shortness of breath.   Musculoskeletal: Positive for back pain.  Psychiatric/Behavioral: Positive for depression. The patient is nervous/anxious and has insomnia.      Objective  Filed Vitals:   02/24/15 0942  BP: 120/71  Pulse: 91  Temp: 97.9 F (36.6 C)  TempSrc: Oral  Resp: 20  Height: 5\' 3"  (1.6 m)  Weight: 136 lb 11.2 oz (62.007 kg)  SpO2: 93%    Physical Exam  Constitutional: She is oriented to person, place, and time and well-developed, well-nourished, and in no distress.  HENT:  Head: Normocephalic and atraumatic.  Left Ear: Tympanic membrane normal.  Mouth/Throat: No oropharyngeal exudate or posterior oropharyngeal erythema.  Right TM not visualized 2/2 cerumen Left nasal turbinate hypertrophy  Cardiovascular: Normal rate and regular rhythm.   Pulmonary/Chest: She has wheezes (soft expiratory wheezes in both lung fields). She has no rales.  Musculoskeletal: Normal range of motion. She exhibits no edema.       Lumbar back: She exhibits tenderness, pain and spasm.       Back:  Neurological: She is alert and oriented to person, place, and time.  Psychiatric: Memory, affect and judgment normal. Her mood appears anxious.  Nursing note and vitals reviewed.   Assessment & Plan  1. Chronic obstructive pulmonary disease with acute exacerbation (HCC) Symptomatic, will start on ABX, steroid, and advised to take albuterol every 6 hours as needed. - doxycycline (VIBRA-TABS) 100 MG tablet; Take 1 tablet (100 mg total) by mouth 2 (two) times  daily.  Dispense: 14 tablet; Refill: 0 - predniSONE (STERAPRED UNI-PAK 21 TAB) 10 MG (21) TBPK tablet; Take 1 tablet (10 mg total) by mouth daily. 60 50 40 30 20 10  then STOP  Dispense: 21 tablet; Refill: 0 - albuterol (PROVENTIL HFA;VENTOLIN HFA) 108 (90 Base) MCG/ACT inhaler; Inhale 2 puffs into the lungs every 6 (six) hours as needed for wheezing or shortness of breath.  Dispense: 1 Inhaler; Refill: 1  2. Chronic radicular low back pain Stable on opioid therapy. Refills provided - HYDROcodone-acetaminophen (NORCO) 10-325 MG tablet; Take 1 tablet by mouth every 6 (six) hours as needed.  Dispense: 120 tablet; Refill: 0  3. Peripheral sensory neuropathy (HCC)  - pregabalin (LYRICA) 75 MG capsule; Take 1 capsule (  75 mg total) by mouth 3 (three) times daily.  Dispense: 90 capsule; Refill: 0  4. Panic disorder with agoraphobia and severe panic attacks Symptoms responsive to alprazolam taken 3 times daily as needed. Patient aware of the dependence potential, and drug interactions of alprazolam. Refills provided. - alprazolam (XANAX) 2 MG tablet; Take 1 tablet (2 mg total) by mouth 3 (three) times daily as needed for anxiety.  Dispense: 90 tablet; Refill: 0    Kathrin Folden Asad A. [facility] 02/24/2015 9:49 AM

## 2015-03-11 DIAGNOSIS — F29 Unspecified psychosis not due to a substance or known physiological condition: Secondary | ICD-10-CM | POA: Diagnosis not present

## 2015-03-11 DIAGNOSIS — F419 Anxiety disorder, unspecified: Secondary | ICD-10-CM | POA: Diagnosis not present

## 2015-03-23 ENCOUNTER — Other Ambulatory Visit: Payer: Self-pay | Admitting: Family Medicine

## 2015-03-24 ENCOUNTER — Encounter: Payer: Self-pay | Admitting: Family Medicine

## 2015-03-24 ENCOUNTER — Ambulatory Visit (INDEPENDENT_AMBULATORY_CARE_PROVIDER_SITE_OTHER): Payer: Commercial Managed Care - HMO | Admitting: Family Medicine

## 2015-03-24 VITALS — BP 122/68 | HR 94 | Temp 98.6°F | Resp 17 | Ht 63.0 in | Wt 132.2 lb

## 2015-03-24 DIAGNOSIS — E785 Hyperlipidemia, unspecified: Secondary | ICD-10-CM

## 2015-03-24 DIAGNOSIS — I10 Essential (primary) hypertension: Secondary | ICD-10-CM

## 2015-03-24 DIAGNOSIS — E876 Hypokalemia: Secondary | ICD-10-CM | POA: Diagnosis not present

## 2015-03-24 DIAGNOSIS — F4001 Agoraphobia with panic disorder: Secondary | ICD-10-CM

## 2015-03-24 DIAGNOSIS — G8929 Other chronic pain: Secondary | ICD-10-CM

## 2015-03-24 DIAGNOSIS — M5410 Radiculopathy, site unspecified: Secondary | ICD-10-CM

## 2015-03-24 DIAGNOSIS — M5416 Radiculopathy, lumbar region: Principal | ICD-10-CM

## 2015-03-24 MED ORDER — ALPRAZOLAM 2 MG PO TABS: 2.0000 mg | ORAL_TABLET | Freq: Three times a day (TID) | ORAL | Status: DC | PRN

## 2015-03-24 MED ORDER — AMLODIPINE BESYLATE 10 MG PO TABS: 10.0000 mg | ORAL_TABLET | Freq: Every day | ORAL | Status: DC

## 2015-03-24 MED ORDER — HYDROCODONE-ACETAMINOPHEN 10-325 MG PO TABS: 1.0000 | ORAL_TABLET | Freq: Four times a day (QID) | ORAL | Status: DC | PRN

## 2015-03-24 NOTE — Progress Notes (Signed)
Name: Tamara Mcclure   MRN: [id number]    DOB: 1955-11-04   Date:03/24/2015       Progress Note  Subjective  Chief Complaint  Chief Complaint  Patient presents with  . Follow-up    1 mo  . Medication Refill    xanax 2 mg / hydrocodone / amlodipine 10 mg    HPI  Panic Disorder with Agoraphobia: Severe panic attacks (heart racing, shortness of breath, jittery), avoids going out and interacting with people. On high-dose Alprazolam 2 mg three times daily as needed, which relieves her symptoms.  Chronic Low Back Pain: Severe low back pain, rated at 7/10 today, takes Hydrocodone-Acetaminophen 10-325 mg every 6 hours as needed for relief. No side effects.  Hypertension: BP is at goal, takes Amlodipine 10 mg daily, no concerning symptoms.  Past Medical History  Diagnosis Date  . Anxiety   . Depression   . COPD (chronic obstructive pulmonary disease) ([place])   . Hypertension   . Panic disorder with agoraphobia and severe panic attacks   . Chronic lower back pain   . Peripheral sensory neuropathy [facility])     Past Surgical History  Procedure Laterality Date  . None      Family History  Problem Relation Age of Onset  . Cancer Mother     Lung  . Cancer Father     pancreatic  . Breast cancer Neg Hx   . Ovarian cancer Neg Hx   . Colon cancer Neg Hx   . Diabetes Neg Hx   . Heart disease Neg Hx     Social History   Social History  . Marital Status: Single    Spouse Name: N/A  . Number of Children: N/A  . Years of Education: N/A   Occupational History  . Not on file.   Social History Main Topics  . Smoking status: Current Every Day Smoker -- 1.00 packs/day for 30 years    Types: Cigarettes  . Smokeless tobacco: Not on file  . Alcohol Use: No  . Drug Use: No  . Sexual Activity: Not Currently   Other Topics Concern  . Not on file   Social History Narrative     Current outpatient prescriptions:  .  albuterol (PROVENTIL HFA;VENTOLIN HFA) 108 (90 Base) MCG/ACT  inhaler, Inhale 2 puffs into the lungs every 6 (six) hours as needed for wheezing or shortness of breath., Disp: 1 Inhaler, Rfl: 1 .  alprazolam (XANAX) 2 MG tablet, Take 1 tablet (2 mg total) by mouth 3 (three) times daily as needed for anxiety., Disp: 90 tablet, Rfl: 0 .  amLODipine (NORVASC) 10 MG tablet, Take 1 tablet (10 mg total) by mouth daily., Disp: 90 tablet, Rfl: 1 .  carisoprodol (SOMA) 350 MG tablet, Take 350 mg by mouth 3 (three) times daily. , Disp: , Rfl:  .  doxycycline (VIBRA-TABS) 100 MG tablet, Take 1 tablet (100 mg total) by mouth 2 (two) times daily., Disp: 14 tablet, Rfl: 0 .  fluticasone (FLONASE) 50 MCG/ACT nasal spray, USE 2 SPRAYS IN EACH NOSTRILS ONCE A DAY, Disp: 16 g, Rfl: 2 .  Fluticasone-Salmeterol (ADVAIR) 250-50 MCG/DOSE AEPB, Inhale 1 puff into the lungs every 12 (twelve) hours., Disp: 60 each, Rfl: 0 .  HYDROcodone-acetaminophen (NORCO) 10-325 MG tablet, Take 1 tablet by mouth every 6 (six) hours as needed., Disp: 120 tablet, Rfl: 0 .  ibuprofen (ADVIL,MOTRIN) 200 MG tablet, Take 200 mg by mouth every 6 (six) hours as needed for mild pain. ,  Disp: , Rfl:  .  Ipratropium-Albuterol (COMBIVENT) 20-100 MCG/ACT AERS respimat, Inhale 1 puff into the lungs every 6 (six) hours as needed for wheezing or shortness of breath., Disp: 1 Inhaler, Rfl: 2 .  pantoprazole (PROTONIX) 40 MG tablet, TAKE 1 TABLET BY MOUTH ONCE A DAY, Disp: 30 tablet, Rfl: 0 .  potassium chloride (K-DUR,KLOR-CON) 10 MEQ tablet, Take 1 tablet (10 mEq total) by mouth daily., Disp: 90 tablet, Rfl: 0 .  predniSONE (STERAPRED UNI-PAK 21 TAB) 10 MG (21) TBPK tablet, Take 1 tablet (10 mg total) by mouth daily. 60 50 40 30 20 10  then STOP, Disp: 21 tablet, Rfl: 0 .  pregabalin (LYRICA) 75 MG capsule, Take 1 capsule (75 mg total) by mouth 3 (three) times daily., Disp: 90 capsule, Rfl: 0 .  QUEtiapine (SEROQUEL) 100 MG tablet, Take 1 tablet (100 mg total) by mouth at bedtime., Disp: 30 tablet, Rfl: 2  Allergies    Allergen Reactions  . Penicillins Rash    .penallergy     Review of Systems  Constitutional: Negative for fever and chills.  Eyes: Negative for blurred vision.  Cardiovascular: Negative for chest pain.  Musculoskeletal: Positive for back pain.  Neurological: Negative for headaches.  Psychiatric/Behavioral: Positive for depression. The patient is nervous/anxious and has insomnia.     Objective  Filed Vitals:   03/24/15 0952  BP: 122/68  Pulse: 94  Temp: 98.6 F (37 C)  TempSrc: Oral  Resp: 17  Height: 5\' 3"  (1.6 m)  Weight: 132 lb 3.2 oz (59.966 kg)  SpO2: 96%    Physical Exam  Constitutional: She is oriented to person, place, and time and well-developed, well-nourished, and in no distress.  Cardiovascular: Normal rate and regular rhythm.   Pulmonary/Chest: Effort normal and breath sounds normal.  Musculoskeletal:       Lumbar back: She exhibits tenderness, pain and spasm.       Back:  Neurological: She is alert and oriented to person, place, and time.  Nursing note and vitals reviewed.      Assessment & Plan  1. Chronic radicular low back pain Stable and responsive to opioid therapy. - HYDROcodone-acetaminophen (NORCO) 10-325 MG tablet; Take 1 tablet by mouth every 6 (six) hours as needed.  Dispense: 120 tablet; Refill: 0  2. Essential hypertension BP at goal and controlled - amLODipine (NORVASC) 10 MG tablet; Take 1 tablet (10 mg total) by mouth daily.  Dispense: 90 tablet; Refill: 1 - Comprehensive Metabolic Panel (CMET)  3. Panic disorder with agoraphobia and severe panic attacks Panic attacks responsive to alprazolam taken up to 3 times daily as needed. Refills provided. - alprazolam (XANAX) 2 MG tablet; Take 1 tablet (2 mg total) by mouth 3 (three) times daily as needed for anxiety.  Dispense: 90 tablet; Refill: 0  4. Dyslipidemia  - Lipid Profile - Comprehensive Metabolic Panel (CMET)  5. Hypokalemia  - Comprehensive Metabolic Panel  (CMET)   [name] Asad A. [facility] 03/24/2015 10:06 AM

## 2015-04-06 DIAGNOSIS — C44311 Basal cell carcinoma of skin of nose: Secondary | ICD-10-CM | POA: Diagnosis not present

## 2015-04-06 DIAGNOSIS — L812 Freckles: Secondary | ICD-10-CM | POA: Diagnosis not present

## 2015-04-06 DIAGNOSIS — L578 Other skin changes due to chronic exposure to nonionizing radiation: Secondary | ICD-10-CM | POA: Diagnosis not present

## 2015-04-06 DIAGNOSIS — L918 Other hypertrophic disorders of the skin: Secondary | ICD-10-CM | POA: Diagnosis not present

## 2015-04-06 DIAGNOSIS — D485 Neoplasm of uncertain behavior of skin: Secondary | ICD-10-CM | POA: Diagnosis not present

## 2015-04-06 DIAGNOSIS — L821 Other seborrheic keratosis: Secondary | ICD-10-CM | POA: Diagnosis not present

## 2015-04-22 ENCOUNTER — Encounter: Payer: Self-pay | Admitting: Family Medicine

## 2015-04-22 ENCOUNTER — Ambulatory Visit (INDEPENDENT_AMBULATORY_CARE_PROVIDER_SITE_OTHER): Payer: Commercial Managed Care - HMO | Admitting: Family Medicine

## 2015-04-22 VITALS — BP 122/66 | HR 85 | Temp 97.9°F | Resp 16 | Ht 63.0 in | Wt 130.5 lb

## 2015-04-22 DIAGNOSIS — E876 Hypokalemia: Secondary | ICD-10-CM | POA: Diagnosis not present

## 2015-04-22 DIAGNOSIS — F4001 Agoraphobia with panic disorder: Secondary | ICD-10-CM

## 2015-04-22 DIAGNOSIS — M5416 Radiculopathy, lumbar region: Principal | ICD-10-CM

## 2015-04-22 DIAGNOSIS — M5410 Radiculopathy, site unspecified: Secondary | ICD-10-CM

## 2015-04-22 DIAGNOSIS — G629 Polyneuropathy, unspecified: Secondary | ICD-10-CM

## 2015-04-22 DIAGNOSIS — G8929 Other chronic pain: Secondary | ICD-10-CM

## 2015-04-22 DIAGNOSIS — I10 Essential (primary) hypertension: Secondary | ICD-10-CM | POA: Diagnosis not present

## 2015-04-22 DIAGNOSIS — G608 Other hereditary and idiopathic neuropathies: Secondary | ICD-10-CM

## 2015-04-22 DIAGNOSIS — E785 Hyperlipidemia, unspecified: Secondary | ICD-10-CM | POA: Diagnosis not present

## 2015-04-22 MED ORDER — ALPRAZOLAM 2 MG PO TABS: 2.0000 mg | ORAL_TABLET | Freq: Three times a day (TID) | ORAL | Status: DC | PRN

## 2015-04-22 MED ORDER — PREGABALIN 75 MG PO CAPS: 75.0000 mg | ORAL_CAPSULE | Freq: Three times a day (TID) | ORAL | Status: DC

## 2015-04-22 MED ORDER — HYDROCODONE-ACETAMINOPHEN 10-325 MG PO TABS: 1.0000 | ORAL_TABLET | Freq: Four times a day (QID) | ORAL | Status: DC | PRN

## 2015-04-22 NOTE — Progress Notes (Signed)
Name: Tamara Mcclure   MRN: [id number]    DOB: 1955-07-09   Date:04/22/2015       Progress Note  Subjective  Chief Complaint  Chief Complaint  Patient presents with  . Follow-up    1 mo  . Medication Refill    HPI  Panic Disorder with Agoraphobia: Severe panic attacks (heart racing, shortness of breath, jittery), avoids going out and interacting with people. Now worse since she was diagnosed with basal cell carcinoma and feels like the moment she wakes up, she is panicking. On high-dose Alprazolam 2 mg three times daily as needed, which relieves her symptoms.  Chronic Low Back Pain: Severe low back pain, rated at 8/10 today, takes Hydrocodone-Acetaminophen 10-325 mg every 6 hours as needed for relief. No side effects reported.   Past Medical History  Diagnosis Date  . Anxiety   . Depression   . COPD (chronic obstructive pulmonary disease) ([place])   . Hypertension   . Panic disorder with agoraphobia and severe panic attacks   . Chronic lower back pain   . Peripheral sensory neuropathy [facility])     Past Surgical History  Procedure Laterality Date  . None      Family History  Problem Relation Age of Onset  . Cancer Mother     Lung  . Cancer Father     pancreatic  . Breast cancer Neg Hx   . Ovarian cancer Neg Hx   . Colon cancer Neg Hx   . Diabetes Neg Hx   . Heart disease Neg Hx     Social History   Social History  . Marital Status: Single    Spouse Name: N/A  . Number of Children: N/A  . Years of Education: N/A   Occupational History  . Not on file.   Social History Main Topics  . Smoking status: Current Every Day Smoker -- 1.00 packs/day for 30 years    Types: Cigarettes  . Smokeless tobacco: Not on file  . Alcohol Use: No  . Drug Use: No  . Sexual Activity: Not Currently   Other Topics Concern  . Not on file   Social History Narrative     Current outpatient prescriptions:  .  albuterol (PROVENTIL HFA;VENTOLIN HFA) 108 (90 Base) MCG/ACT inhaler,  Inhale 2 puffs into the lungs every 6 (six) hours as needed for wheezing or shortness of breath., Disp: 1 Inhaler, Rfl: 1 .  alprazolam (XANAX) 2 MG tablet, Take 1 tablet (2 mg total) by mouth 3 (three) times daily as needed for anxiety., Disp: 90 tablet, Rfl: 0 .  amLODipine (NORVASC) 10 MG tablet, Take 1 tablet (10 mg total) by mouth daily., Disp: 90 tablet, Rfl: 1 .  carisoprodol (SOMA) 350 MG tablet, Take 350 mg by mouth 3 (three) times daily. , Disp: , Rfl:  .  doxycycline (VIBRA-TABS) 100 MG tablet, Take 1 tablet (100 mg total) by mouth 2 (two) times daily., Disp: 14 tablet, Rfl: 0 .  fluticasone (FLONASE) 50 MCG/ACT nasal spray, USE 2 SPRAYS IN EACH NOSTRILS ONCE A DAY, Disp: 16 g, Rfl: 2 .  Fluticasone-Salmeterol (ADVAIR) 250-50 MCG/DOSE AEPB, Inhale 1 puff into the lungs every 12 (twelve) hours., Disp: 60 each, Rfl: 0 .  HYDROcodone-acetaminophen (NORCO) 10-325 MG tablet, Take 1 tablet by mouth every 6 (six) hours as needed., Disp: 120 tablet, Rfl: 0 .  ibuprofen (ADVIL,MOTRIN) 200 MG tablet, Take 200 mg by mouth every 6 (six) hours as needed for mild pain. , Disp: ,  Rfl:  .  Ipratropium-Albuterol (COMBIVENT) 20-100 MCG/ACT AERS respimat, Inhale 1 puff into the lungs every 6 (six) hours as needed for wheezing or shortness of breath., Disp: 1 Inhaler, Rfl: 2 .  pantoprazole (PROTONIX) 40 MG tablet, TAKE 1 TABLET BY MOUTH ONCE A DAY, Disp: 30 tablet, Rfl: 0 .  potassium chloride (K-DUR,KLOR-CON) 10 MEQ tablet, Take 1 tablet (10 mEq total) by mouth daily., Disp: 90 tablet, Rfl: 0 .  predniSONE (STERAPRED UNI-PAK 21 TAB) 10 MG (21) TBPK tablet, Take 1 tablet (10 mg total) by mouth daily. 60 50 40 30 20 10  then STOP, Disp: 21 tablet, Rfl: 0 .  pregabalin (LYRICA) 75 MG capsule, Take 1 capsule (75 mg total) by mouth 3 (three) times daily., Disp: 90 capsule, Rfl: 0 .  QUEtiapine (SEROQUEL) 100 MG tablet, Take 1 tablet (100 mg total) by mouth at bedtime., Disp: 30 tablet, Rfl: 2  Allergies    Allergen Reactions  . Penicillins Rash    .penallergy     Review of Systems  Musculoskeletal: Positive for back pain.  Neurological: Positive for tingling.  Psychiatric/Behavioral: Positive for depression. The patient is nervous/anxious.       Objective  Filed Vitals:   04/22/15 1140  BP: 122/66  Pulse: 85  Temp: 97.9 F (36.6 C)  TempSrc: Oral  Resp: 16  Height: 5\' 3"  (1.6 m)  Weight: 130 lb 8 oz (59.194 kg)  SpO2: 96%    Physical Exam  Constitutional: She is oriented to person, place, and time and well-developed, well-nourished, and in no distress.  HENT:  Head: Normocephalic and atraumatic.  Cardiovascular: Normal rate and regular rhythm.   Pulmonary/Chest: Effort normal and breath sounds normal.  Musculoskeletal:       Right ankle: She exhibits no swelling.       Left ankle: She exhibits no swelling.       Lumbar back: She exhibits tenderness, pain and spasm.       Back:  Neurological: She is alert and oriented to person, place, and time.  Nursing note and vitals reviewed.       Assessment & Plan  1. Chronic radicular low back pain Stable on hydrocodone taken every 6 hours as needed. - HYDROcodone-acetaminophen (NORCO) 10-325 MG tablet; Take 1 tablet by mouth every 6 (six) hours as needed.  Dispense: 120 tablet; Refill: 0  2. Panic disorder with agoraphobia and severe panic attacks Taking Alprazolam 2 mg 3 times daily as needed, refills provided. - alprazolam (XANAX) 2 MG tablet; Take 1 tablet (2 mg total) by mouth 3 (three) times daily as needed for anxiety.  Dispense: 90 tablet; Refill: 0  3. Peripheral sensory neuropathy (HCC)  - pregabalin (LYRICA) 75 MG capsule; Take 1 capsule (75 mg total) by mouth 3 (three) times daily.  Dispense: 90 capsule; Refill: 0   Tamara Mcclure Tamara Mcclure 04/22/2015 12:31 PM

## 2015-04-23 ENCOUNTER — Telehealth: Payer: Self-pay | Admitting: Family Medicine

## 2015-04-23 LAB — COMPREHENSIVE METABOLIC PANEL
A/G RATIO: 2 (ref 1.2–2.2)
ALT: 20 IU/L (ref 0–32)
AST: 18 IU/L (ref 0–40)
Albumin: 4.6 g/dL (ref 3.5–5.5)
Alkaline Phosphatase: 122 IU/L — ABNORMAL HIGH (ref 39–117)
BILIRUBIN TOTAL: 0.4 mg/dL (ref 0.0–1.2)
BUN/Creatinine Ratio: 6 — ABNORMAL LOW (ref 9–23)
BUN: 4 mg/dL — ABNORMAL LOW (ref 6–24)
CALCIUM: 9.7 mg/dL (ref 8.7–10.2)
CHLORIDE: 92 mmol/L — AB (ref 96–106)
CO2: 22 mmol/L (ref 18–29)
Creatinine, Ser: 0.65 mg/dL (ref 0.57–1.00)
GFR, EST AFRICAN AMERICAN: 113 mL/min/{1.73_m2} (ref >59)
GFR, EST NON AFRICAN AMERICAN: 98 mL/min/{1.73_m2} (ref >59)
GLOBULIN, TOTAL: 2.3 g/dL (ref 1.5–4.5)
Glucose: 90 mg/dL (ref 65–99)
Potassium: 5.5 mmol/L — ABNORMAL HIGH (ref 3.5–5.2)
SODIUM: 132 mmol/L — AB (ref 134–144)
TOTAL PROTEIN: 6.9 g/dL (ref 6.0–8.5)

## 2015-04-23 LAB — LIPID PANEL
CHOL/HDL RATIO: 2.7 ratio (ref 0.0–4.4)
CHOLESTEROL TOTAL: 220 mg/dL — AB (ref 100–199)
HDL: 83 mg/dL (ref >39)
LDL CALC: 122 mg/dL — AB (ref 0–99)
TRIGLYCERIDES: 75 mg/dL (ref 0–149)
VLDL Cholesterol Cal: 15 mg/dL (ref 5–40)

## 2015-04-23 NOTE — Telephone Encounter (Signed)
Pt would like to know results to her labs.

## 2015-04-23 NOTE — Telephone Encounter (Signed)
Lab results have been reported to patient  

## 2015-05-04 ENCOUNTER — Telehealth: Payer: Self-pay | Admitting: Family Medicine

## 2015-05-04 NOTE — Telephone Encounter (Signed)
Pt would like a call back about how she can go about getting home health to come to her house. Please advise.

## 2015-05-05 NOTE — Telephone Encounter (Signed)
We'll have to discuss reasons and supporting diagnoses for home health at her next follow-up appointment

## 2015-05-05 NOTE — Telephone Encounter (Signed)
Routed to Dr. Manuella Ghazi for referral advice

## 2015-05-21 ENCOUNTER — Ambulatory Visit (INDEPENDENT_AMBULATORY_CARE_PROVIDER_SITE_OTHER): Payer: Commercial Managed Care - HMO | Admitting: Family Medicine

## 2015-05-21 ENCOUNTER — Encounter: Payer: Self-pay | Admitting: Family Medicine

## 2015-05-21 VITALS — BP 124/72 | HR 86 | Temp 98.7°F | Resp 16 | Ht 63.0 in | Wt 127.9 lb

## 2015-05-21 DIAGNOSIS — F4001 Agoraphobia with panic disorder: Secondary | ICD-10-CM | POA: Diagnosis not present

## 2015-05-21 DIAGNOSIS — R0981 Nasal congestion: Secondary | ICD-10-CM

## 2015-05-21 DIAGNOSIS — G4700 Insomnia, unspecified: Secondary | ICD-10-CM

## 2015-05-21 DIAGNOSIS — M5416 Radiculopathy, lumbar region: Principal | ICD-10-CM

## 2015-05-21 DIAGNOSIS — G8929 Other chronic pain: Secondary | ICD-10-CM

## 2015-05-21 DIAGNOSIS — M5410 Radiculopathy, site unspecified: Secondary | ICD-10-CM | POA: Diagnosis not present

## 2015-05-21 DIAGNOSIS — N644 Mastodynia: Secondary | ICD-10-CM | POA: Diagnosis not present

## 2015-05-21 DIAGNOSIS — G629 Polyneuropathy, unspecified: Secondary | ICD-10-CM | POA: Diagnosis not present

## 2015-05-21 DIAGNOSIS — G608 Other hereditary and idiopathic neuropathies: Secondary | ICD-10-CM

## 2015-05-21 MED ORDER — QUETIAPINE FUMARATE 100 MG PO TABS: 100.0000 mg | ORAL_TABLET | Freq: Every day | ORAL | Status: DC

## 2015-05-21 MED ORDER — PREGABALIN 75 MG PO CAPS: 75.0000 mg | ORAL_CAPSULE | Freq: Three times a day (TID) | ORAL | Status: DC

## 2015-05-21 MED ORDER — HYDROCODONE-ACETAMINOPHEN 10-325 MG PO TABS: 1.0000 | ORAL_TABLET | Freq: Four times a day (QID) | ORAL | Status: DC | PRN

## 2015-05-21 MED ORDER — FLUTICASONE PROPIONATE 50 MCG/ACT NA SUSP: 2.0000 | Freq: Every day | NASAL | Status: DC

## 2015-05-21 MED ORDER — ALPRAZOLAM 2 MG PO TABS: 2.0000 mg | ORAL_TABLET | Freq: Three times a day (TID) | ORAL | Status: DC | PRN

## 2015-05-21 NOTE — Progress Notes (Signed)
Name: Tamara Mcclure   MRN: [id number]    DOB: 27-Jul-1955   Date:05/21/2015       Progress Note  Subjective  Chief Complaint  Chief Complaint  Patient presents with  . Pain    medication refills  . Anxiety    Anxiety Presents for follow-up visit. The problem has been gradually worsening. Symptoms include depressed mood, excessive worry, insomnia (With medication, she has no insomnia), irritability, nervous/anxious behavior and panic. Patient reports no dizziness or suicidal ideas. The severity of symptoms is causing significant distress and severe.   Her past medical history is significant for anxiety/panic attacks. Past treatments include benzodiazephines. The treatment provided moderate relief. Compliance with prior treatments has been good.  Back Pain This is a chronic problem. The problem is unchanged. The pain is present in the lumbar spine. The pain does not radiate. The pain is at a severity of 8/10. The pain is severe. The pain is the same all the time. The symptoms are aggravated by bending and position. Associated symptoms include numbness (numbness in leg and feet, peripheral neuropathy.) and tingling. Pertinent negatives include no bladder incontinence, bowel incontinence or fever. She has tried analgesics (Lyrica and Hydrocodone for low back pain and  tingling in feet.) for the symptoms.  Insomnia Primary symptoms: no difficulty falling asleep, frequent awakening, malaise/fatigue.  The onset quality is gradual. Past treatments include medication. Typical bedtime:  8-10 P.M. (7 PM).  How long after going to bed to you fall asleep: 15-30 minutes.   PMH includes: depression.   Rhinitis: Pt. Takes Flonase 2 sprays daily for runny nose and stuffiness, believes she has history of allergies, sinus congestion.   Left Breast Mass: Pt. Wants to have her left breast checked for a reported mass/abnormality. She has noticed an area in her left medial breast which is reportedly abnormal, no  pain in the area but feels like its sore after she has been feeling and palpating the area.   Past Medical History  Diagnosis Date  . Anxiety   . Depression   . COPD (chronic obstructive pulmonary disease) ([place])   . Hypertension   . Panic disorder with agoraphobia and severe panic attacks   . Chronic lower back pain   . Peripheral sensory neuropathy [facility])     Past Surgical History  Procedure Laterality Date  . None      Family History  Problem Relation Age of Onset  . Cancer Mother     Lung  . Cancer Father     pancreatic  . Breast cancer Neg Hx   . Ovarian cancer Neg Hx   . Colon cancer Neg Hx   . Diabetes Neg Hx   . Heart disease Neg Hx     Social History   Social History  . Marital Status: Single    Spouse Name: N/A  . Number of Children: N/A  . Years of Education: N/A   Occupational History  . Not on file.   Social History Main Topics  . Smoking status: Current Every Day Smoker -- 1.00 packs/day for 30 years    Types: Cigarettes  . Smokeless tobacco: Not on file  . Alcohol Use: No  . Drug Use: No  . Sexual Activity: Not Currently   Other Topics Concern  . Not on file   Social History Narrative     Current outpatient prescriptions:  .  albuterol (PROVENTIL HFA;VENTOLIN HFA) 108 (90 Base) MCG/ACT inhaler, Inhale 2 puffs into the lungs every 6 (  six) hours as needed for wheezing or shortness of breath., Disp: 1 Inhaler, Rfl: 1 .  alprazolam (XANAX) 2 MG tablet, Take 1 tablet (2 mg total) by mouth 3 (three) times daily as needed for anxiety., Disp: 90 tablet, Rfl: 0 .  amLODipine (NORVASC) 10 MG tablet, Take 1 tablet (10 mg total) by mouth daily., Disp: 90 tablet, Rfl: 1 .  carisoprodol (SOMA) 350 MG tablet, Take 350 mg by mouth 3 (three) times daily. , Disp: , Rfl:  .  fluticasone (FLONASE) 50 MCG/ACT nasal spray, USE 2 SPRAYS IN EACH NOSTRILS ONCE A DAY, Disp: 16 g, Rfl: 2 .  Fluticasone-Salmeterol (ADVAIR) 250-50 MCG/DOSE AEPB, Inhale 1 puff into the  lungs every 12 (twelve) hours., Disp: 60 each, Rfl: 0 .  HYDROcodone-acetaminophen (NORCO) 10-325 MG tablet, Take 1 tablet by mouth every 6 (six) hours as needed., Disp: 120 tablet, Rfl: 0 .  ibuprofen (ADVIL,MOTRIN) 200 MG tablet, Take 200 mg by mouth every 6 (six) hours as needed for mild pain. , Disp: , Rfl:  .  Ipratropium-Albuterol (COMBIVENT) 20-100 MCG/ACT AERS respimat, Inhale 1 puff into the lungs every 6 (six) hours as needed for wheezing or shortness of breath., Disp: 1 Inhaler, Rfl: 2 .  pantoprazole (PROTONIX) 40 MG tablet, TAKE 1 TABLET BY MOUTH ONCE A DAY, Disp: 30 tablet, Rfl: 0 .  potassium chloride (K-DUR,KLOR-CON) 10 MEQ tablet, Take 1 tablet (10 mEq total) by mouth daily., Disp: 90 tablet, Rfl: 0 .  pregabalin (LYRICA) 75 MG capsule, Take 1 capsule (75 mg total) by mouth 3 (three) times daily., Disp: 90 capsule, Rfl: 0 .  QUEtiapine (SEROQUEL) 100 MG tablet, Take 1 tablet (100 mg total) by mouth at bedtime., Disp: 30 tablet, Rfl: 2  Allergies  Allergen Reactions  . Penicillins Rash    .penallergy     Review of Systems  Constitutional: Positive for malaise/fatigue and irritability. Negative for fever and chills.  HENT: Positive for congestion.   Gastrointestinal: Negative for bowel incontinence.  Genitourinary: Negative for bladder incontinence.  Musculoskeletal: Positive for back pain and joint pain.  Neurological: Positive for tingling and numbness (numbness in leg and feet, peripheral neuropathy.). Negative for dizziness.  Psychiatric/Behavioral: Positive for depression. Negative for suicidal ideas. The patient is nervous/anxious and has insomnia (With medication, she has no insomnia).      Objective  Filed Vitals:   05/21/15 1003  BP: 124/72  Pulse: 86  Temp: 98.7 F (37.1 C)  TempSrc: Oral  Resp: 16  Height: 5\' 3"  (1.6 m)  Weight: 127 lb 14.4 oz (58.015 kg)  SpO2: 94%    Physical Exam  Constitutional: She is oriented to person, place, and time and  well-developed, well-nourished, and in no distress.  HENT:  Nose: Right sinus exhibits maxillary sinus tenderness. Left sinus exhibits maxillary sinus tenderness.  Nasal mucosa normal, left turbinate mildly hypertrophied, bilateral maxillary sinus tenderness  Cardiovascular: Normal rate, regular rhythm, S1 normal, S2 normal and normal heart sounds.   Pulmonary/Chest: Breath sounds normal. She has no decreased breath sounds. She has no wheezes.    No palpable abnormality however, not able to palpate the entire breast because patient only wanted the area of her concern checked.  Musculoskeletal:       Lumbar back: She exhibits tenderness, pain and spasm.  Neurological: She is alert and oriented to person, place, and time.  Psychiatric: Memory, affect and judgment normal. Her mood appears anxious.  Nursing note and vitals reviewed.     Assessment &  Plan  1. Chronic radicular low back pain Stable and controlled on present therapy, refills provided and follow-up in one month - HYDROcodone-acetaminophen (NORCO) 10-325 MG tablet; Take 1 tablet by mouth every 6 (six) hours as needed.  Dispense: 120 tablet; Refill: 0  2. Insomnia  - QUEtiapine (SEROQUEL) 100 MG tablet; Take 1 tablet (100 mg total) by mouth at bedtime.  Dispense: 30 tablet; Refill: 2  3. Panic disorder with agoraphobia and severe panic attacks Stable and responsive to alprazolam taken up to 3 times daily as needed, refills provided and follow-up in one month - alprazolam (XANAX) 2 MG tablet; Take 1 tablet (2 mg total) by mouth 3 (three) times daily as needed for anxiety.  Dispense: 90 tablet; Refill: 0  4. Peripheral sensory neuropathy (HCC)  - pregabalin (LYRICA) 75 MG capsule; Take 1 capsule (75 mg total) by mouth 3 (three) times daily.  Dispense: 90 capsule; Refill: 0  5. Nasal sinus congestion  - fluticasone (FLONASE) 50 MCG/ACT nasal spray; Place 2 sprays into both nostrils daily.  Dispense: 16 g; Refill: 2  6.  Pain of left breast No palpable abnormality in the area of concern, however patient did not want her entire breast exam and and would only let me examine through her shirt. Recommended mammogram and possible ultrasound of the area, but patient has declined at this time.   Kharisma Glasner Asad A. [facility] 05/21/2015 10:24 AM

## 2015-06-01 ENCOUNTER — Telehealth: Payer: Self-pay | Admitting: Family Medicine

## 2015-06-01 NOTE — Telephone Encounter (Signed)
Pt would like a call about in home care.

## 2015-06-01 NOTE — Telephone Encounter (Signed)
Routed to Dr. Manuella Ghazi for advice and call back

## 2015-06-02 NOTE — Telephone Encounter (Signed)
Patient wishes to obtain referral for in-home care to help with bathing and other activities of daily living. Patient reports severe peripheral neuropathy which makes her unable to perform much of her daily activities. She has been contacted [employer] number [phone]. Please contact [facility] to determine the process for obtaining a referral for in-home care for assistance with ADLs

## 2015-06-18 ENCOUNTER — Ambulatory Visit: Payer: Commercial Managed Care - HMO | Admitting: Family Medicine

## 2015-06-18 ENCOUNTER — Ambulatory Visit (INDEPENDENT_AMBULATORY_CARE_PROVIDER_SITE_OTHER): Payer: Commercial Managed Care - HMO | Admitting: Family Medicine

## 2015-06-18 ENCOUNTER — Encounter: Payer: Self-pay | Admitting: Family Medicine

## 2015-06-18 VITALS — BP 122/68 | HR 85 | Temp 97.8°F | Resp 17 | Ht 63.0 in | Wt 124.6 lb

## 2015-06-18 DIAGNOSIS — G8929 Other chronic pain: Secondary | ICD-10-CM | POA: Diagnosis not present

## 2015-06-18 DIAGNOSIS — M5410 Radiculopathy, site unspecified: Secondary | ICD-10-CM | POA: Diagnosis not present

## 2015-06-18 DIAGNOSIS — M79604 Pain in right leg: Secondary | ICD-10-CM

## 2015-06-18 DIAGNOSIS — M79605 Pain in left leg: Secondary | ICD-10-CM | POA: Diagnosis not present

## 2015-06-18 DIAGNOSIS — G629 Polyneuropathy, unspecified: Secondary | ICD-10-CM | POA: Diagnosis not present

## 2015-06-18 DIAGNOSIS — G608 Other hereditary and idiopathic neuropathies: Secondary | ICD-10-CM

## 2015-06-18 DIAGNOSIS — M5416 Radiculopathy, lumbar region: Secondary | ICD-10-CM

## 2015-06-18 DIAGNOSIS — F4001 Agoraphobia with panic disorder: Secondary | ICD-10-CM | POA: Diagnosis not present

## 2015-06-18 MED ORDER — HYDROCODONE-ACETAMINOPHEN 10-325 MG PO TABS: 1.0000 | ORAL_TABLET | Freq: Four times a day (QID) | ORAL | Status: DC | PRN

## 2015-06-18 MED ORDER — PREGABALIN 75 MG PO CAPS: 75.0000 mg | ORAL_CAPSULE | Freq: Three times a day (TID) | ORAL | Status: DC

## 2015-06-18 MED ORDER — CARISOPRODOL 350 MG PO TABS: 350.0000 mg | ORAL_TABLET | Freq: Three times a day (TID) | ORAL | Status: DC

## 2015-06-18 MED ORDER — ALPRAZOLAM 2 MG PO TABS: 2.0000 mg | ORAL_TABLET | Freq: Three times a day (TID) | ORAL | Status: DC | PRN

## 2015-06-18 NOTE — Progress Notes (Signed)
Name: Tamara Mcclure   MRN: [id number]    DOB: 03/09/1955   Date:06/18/2015       Progress Note  Subjective  Chief Complaint  Chief Complaint  Patient presents with  . Follow-up    1 mo  . Medication Refill    xanax 2 mg / hydrocodone / lyrica 75 mg     Anxiety Presents for follow-up visit. The problem has been gradually worsening. Symptoms include depressed mood, nervous/anxious behavior, palpitations, panic and shortness of breath. The severity of symptoms is severe and causing significant distress.   Past treatments include benzodiazephines. The treatment provided significant relief. Compliance with prior treatments has been good.  Back Pain This is a chronic problem. The pain is present in the lumbar spine. The pain is at a severity of 10/10. The pain is severe. The symptoms are aggravated by stress and position. Associated symptoms include leg pain (back of both legs are sore, hurt all the time, tingling in her feet.) and tingling. She has tried analgesics and NSAIDs for the symptoms.     Past Medical History  Diagnosis Date  . Anxiety   . Depression   . COPD (chronic obstructive pulmonary disease) ([place])   . Hypertension   . Panic disorder with agoraphobia and severe panic attacks   . Chronic lower back pain   . Peripheral sensory neuropathy [facility])     Past Surgical History  Procedure Laterality Date  . None      Family History  Problem Relation Age of Onset  . Cancer Mother     Lung  . Cancer Father     pancreatic  . Breast cancer Neg Hx   . Ovarian cancer Neg Hx   . Colon cancer Neg Hx   . Diabetes Neg Hx   . Heart disease Neg Hx     Social History   Social History  . Marital Status: Single    Spouse Name: N/A  . Number of Children: N/A  . Years of Education: N/A   Occupational History  . Not on file.   Social History Main Topics  . Smoking status: Current Every Day Smoker -- 1.00 packs/day for 30 years    Types: Cigarettes  . Smokeless tobacco:  Not on file  . Alcohol Use: No  . Drug Use: No  . Sexual Activity: Not Currently   Other Topics Concern  . Not on file   Social History Narrative     Current outpatient prescriptions:  .  albuterol (PROVENTIL HFA;VENTOLIN HFA) 108 (90 Base) MCG/ACT inhaler, Inhale 2 puffs into the lungs every 6 (six) hours as needed for wheezing or shortness of breath., Disp: 1 Inhaler, Rfl: 1 .  alprazolam (XANAX) 2 MG tablet, Take 1 tablet (2 mg total) by mouth 3 (three) times daily as needed for anxiety., Disp: 90 tablet, Rfl: 0 .  amLODipine (NORVASC) 10 MG tablet, Take 1 tablet (10 mg total) by mouth daily., Disp: 90 tablet, Rfl: 1 .  carisoprodol (SOMA) 350 MG tablet, Take 350 mg by mouth 3 (three) times daily. , Disp: , Rfl:  .  fluticasone (FLONASE) 50 MCG/ACT nasal spray, Place 2 sprays into both nostrils daily., Disp: 16 g, Rfl: 2 .  Fluticasone-Salmeterol (ADVAIR) 250-50 MCG/DOSE AEPB, Inhale 1 puff into the lungs every 12 (twelve) hours., Disp: 60 each, Rfl: 0 .  HYDROcodone-acetaminophen (NORCO) 10-325 MG tablet, Take 1 tablet by mouth every 6 (six) hours as needed., Disp: 120 tablet, Rfl: 0 .  ibuprofen (  ADVIL,MOTRIN) 200 MG tablet, Take 200 mg by mouth every 6 (six) hours as needed for mild pain. , Disp: , Rfl:  .  Ipratropium-Albuterol (COMBIVENT) 20-100 MCG/ACT AERS respimat, Inhale 1 puff into the lungs every 6 (six) hours as needed for wheezing or shortness of breath., Disp: 1 Inhaler, Rfl: 2 .  pantoprazole (PROTONIX) 40 MG tablet, TAKE 1 TABLET BY MOUTH ONCE A DAY, Disp: 30 tablet, Rfl: 0 .  potassium chloride (K-DUR,KLOR-CON) 10 MEQ tablet, Take 1 tablet (10 mEq total) by mouth daily., Disp: 90 tablet, Rfl: 0 .  pregabalin (LYRICA) 75 MG capsule, Take 1 capsule (75 mg total) by mouth 3 (three) times daily., Disp: 90 capsule, Rfl: 0 .  QUEtiapine (SEROQUEL) 100 MG tablet, Take 1 tablet (100 mg total) by mouth at bedtime., Disp: 30 tablet, Rfl: 2  Allergies  Allergen Reactions  .  Penicillins Rash    .penallergy   Review of Systems  Respiratory: Positive for shortness of breath.   Cardiovascular: Positive for palpitations.  Musculoskeletal: Positive for back pain.  Neurological: Positive for tingling.  Psychiatric/Behavioral: The patient is nervous/anxious.     Objective  Filed Vitals:   06/18/15 1051  BP: 122/68  Pulse: 85  Temp: 97.8 F (36.6 C)  TempSrc: Oral  Resp: 17  Height: 5\' 3"  (1.6 m)  Weight: 124 lb 9.6 oz (56.518 kg)  SpO2: 96%    Physical Exam  Constitutional: She is oriented to person, place, and time and well-developed, well-nourished, and in no distress.  HENT:  Head: Normocephalic and atraumatic.  Right Ear: No drainage or swelling.  Left Ear: No drainage or swelling.  Mouth/Throat: No oropharyngeal exudate or posterior oropharyngeal erythema.  L. Ear canal appears moist, no obvious drainage. R Ear canal has small amount of cerumen,  L. Nasal cavity shows turbinate hypertrophy, mucosal inflammation.  Cardiovascular: Normal rate and regular rhythm.   Pulmonary/Chest: Effort normal. No respiratory distress. She has wheezes in the right middle field, the left middle field and the left lower field.  Fine expiratory wheezing on auscultation.  Musculoskeletal:       Lumbar back: She exhibits tenderness, pain and spasm.       Back:       Left lower leg: She exhibits tenderness. She exhibits no swelling and no edema.       Legs: Diffuse tenderness to palpation with associated muscle spasm in the posterior lower legs at the gastrocnemius muscle  Neurological: She is alert and oriented to person, place, and time.  Nursing note and vitals reviewed.      Assessment & Plan  1. Bilateral leg pain Likely associated muscle spasm, recommended to continue taking Carisoprodol 350 mg 3 times daily. - carisoprodol (SOMA) 350 MG tablet; Take 1 tablet (350 mg total) by mouth 3 (three) times daily.  Dispense: 90 tablet; Refill: 0  2. Chronic  radicular low back pain Secondary to joint of disc disease of lumbosacral spine. Pain controlled on hydrocodone taken every 6 hours as needed. Refills provided - HYDROcodone-acetaminophen (NORCO) 10-325 MG tablet; Take 1 tablet by mouth every 6 (six) hours as needed.  Dispense: 120 tablet; Refill: 0  3. Panic disorder with agoraphobia and severe panic attacks Severe panic disorder, responsive to alprazolam taken 3 times daily as needed. Patient compliant with controlled substances agreement and understands the dependence potential, side effects, and drug interactions. Refills provided and follow-up in one month - alprazolam (XANAX) 2 MG tablet; Take 1 tablet (2 mg total) by mouth  3 (three) times daily as needed for anxiety.  Dispense: 90 tablet; Refill: 0  4. Peripheral sensory neuropathy (HCC)  - pregabalin (LYRICA) 75 MG capsule; Take 1 capsule (75 mg total) by mouth 3 (three) times daily.  Dispense: 90 capsule; Refill: 0   Zaynab Chipman Asad A. [facility] 06/18/2015 11:22 AM

## 2015-06-19 ENCOUNTER — Telehealth: Payer: Self-pay | Admitting: Family Medicine

## 2015-06-19 NOTE — Telephone Encounter (Signed)
Please read previous message from May: checking status on referral for home health, please return call

## 2015-06-23 ENCOUNTER — Telehealth: Payer: Self-pay | Admitting: Family Medicine

## 2015-06-23 NOTE — Telephone Encounter (Signed)
Routed to Dr. Shah for referral approval 

## 2015-06-23 NOTE — Telephone Encounter (Signed)
Pt states she would like to know if she could be on a anti-depressant. Pt states this was discussed at her last visit. Please advise.

## 2015-06-23 NOTE — Telephone Encounter (Signed)
Patient can be started on an antidepressant to help manage her symptoms of anxiety and mood symptoms. Please schedule for an appointment.

## 2015-06-23 NOTE — Telephone Encounter (Signed)
Tamara Mcclure will need a referral by Tamara Mcclure. Please contact Tamara Mcclure to determine the process for obtaining home health referral including the necessary paperwork

## 2015-06-24 NOTE — Telephone Encounter (Signed)
Pt states she will call back to schedule an appt.   

## 2015-07-15 ENCOUNTER — Telehealth: Payer: Self-pay | Admitting: Family Medicine

## 2015-07-15 ENCOUNTER — Other Ambulatory Visit: Payer: Self-pay | Admitting: Family Medicine

## 2015-07-15 NOTE — Telephone Encounter (Signed)
Have appointment for 07-16-15 but wanted to know if you are able to refill Hydrocodone, Alprazolam, and Lyrica without her coming in this week (having transportation issues) . And then she can make appointment for next week to be seen. She would like to get someone to pick them up for her

## 2015-07-16 ENCOUNTER — Encounter: Payer: Self-pay | Admitting: Family Medicine

## 2015-07-16 ENCOUNTER — Ambulatory Visit (INDEPENDENT_AMBULATORY_CARE_PROVIDER_SITE_OTHER): Payer: Commercial Managed Care - HMO | Admitting: Family Medicine

## 2015-07-16 VITALS — BP 122/63 | HR 82 | Temp 97.6°F | Resp 17 | Ht 63.0 in | Wt 125.7 lb

## 2015-07-16 DIAGNOSIS — G629 Polyneuropathy, unspecified: Secondary | ICD-10-CM | POA: Diagnosis not present

## 2015-07-16 DIAGNOSIS — J019 Acute sinusitis, unspecified: Secondary | ICD-10-CM | POA: Insufficient documentation

## 2015-07-16 DIAGNOSIS — G608 Other hereditary and idiopathic neuropathies: Secondary | ICD-10-CM

## 2015-07-16 DIAGNOSIS — G8929 Other chronic pain: Secondary | ICD-10-CM

## 2015-07-16 DIAGNOSIS — M5410 Radiculopathy, site unspecified: Secondary | ICD-10-CM

## 2015-07-16 DIAGNOSIS — F4001 Agoraphobia with panic disorder: Secondary | ICD-10-CM

## 2015-07-16 DIAGNOSIS — J0100 Acute maxillary sinusitis, unspecified: Secondary | ICD-10-CM

## 2015-07-16 DIAGNOSIS — M5416 Radiculopathy, lumbar region: Principal | ICD-10-CM

## 2015-07-16 MED ORDER — HYDROCODONE-ACETAMINOPHEN 10-325 MG PO TABS: 1.0000 | ORAL_TABLET | Freq: Four times a day (QID) | ORAL | Status: DC | PRN

## 2015-07-16 MED ORDER — ALPRAZOLAM 2 MG PO TABS: 2.0000 mg | ORAL_TABLET | Freq: Three times a day (TID) | ORAL | Status: DC | PRN

## 2015-07-16 MED ORDER — DOXYCYCLINE HYCLATE 100 MG PO TABS: 100.0000 mg | ORAL_TABLET | Freq: Two times a day (BID) | ORAL | Status: DC

## 2015-07-16 MED ORDER — PREGABALIN 75 MG PO CAPS: 75.0000 mg | ORAL_CAPSULE | Freq: Three times a day (TID) | ORAL | Status: DC

## 2015-07-16 NOTE — Telephone Encounter (Signed)
Patient was seen in office today and refills were provided

## 2015-07-16 NOTE — Progress Notes (Signed)
Name: Tamara Mcclure   MRN: [id number]    DOB: [birth year]   Date:07/16/2015       Progress Note  Subjective  Chief Complaint  Chief Complaint  Patient presents with  . Follow-up    1 mo  . Medication Refill    Anxiety Presents for follow-up visit. The problem has been gradually worsening. Symptoms include depressed mood, nervous/anxious behavior, palpitations, panic and shortness of breath. The severity of symptoms is severe and causing significant distress.   Past treatments include benzodiazephines. The treatment provided significant relief. Compliance with prior treatments has been good.  Back Pain This is a chronic problem. The pain is present in the lumbar spine. The pain is severe. The symptoms are aggravated by stress and position. Associated symptoms include leg pain and tingling. She has tried analgesics and NSAIDs for the symptoms.     Past Medical History  Diagnosis Date  . Anxiety   . Depression   . COPD (chronic obstructive pulmonary disease) ([place])   . Hypertension   . Panic disorder with agoraphobia and severe panic attacks   . Chronic lower back pain   . Peripheral sensory neuropathy [facility])     Past Surgical History  Procedure Laterality Date  . None      Family History  Problem Relation Age of Onset  . Cancer Mother     Lung  . Cancer Father     pancreatic  . Breast cancer Neg Hx   . Ovarian cancer Neg Hx   . Colon cancer Neg Hx   . Diabetes Neg Hx   . Heart disease Neg Hx     Social History   Social History  . Marital Status: Single    Spouse Name: N/A  . Number of Children: N/A  . Years of Education: N/A   Occupational History  . Not on file.   Social History Main Topics  . Smoking status: Current Every Day Smoker -- 1.00 packs/day for 30 years    Types: Cigarettes  . Smokeless tobacco: Not on file  . Alcohol Use: No  . Drug Use: No  . Sexual Activity: Not Currently   Other Topics Concern  . Not on file   Social History Narrative      Current outpatient prescriptions:  .  albuterol (PROVENTIL HFA;VENTOLIN HFA) 108 (90 Base) MCG/ACT inhaler, Inhale 2 puffs into the lungs every 6 (six) hours as needed for wheezing or shortness of breath., Disp: 1 Inhaler, Rfl: 1 .  alprazolam (XANAX) 2 MG tablet, Take 1 tablet (2 mg total) by mouth 3 (three) times daily as needed for anxiety., Disp: 90 tablet, Rfl: 0 .  amLODipine (NORVASC) 10 MG tablet, Take 1 tablet (10 mg total) by mouth daily., Disp: 90 tablet, Rfl: 1 .  carisoprodol (SOMA) 350 MG tablet, Take 1 tablet (350 mg total) by mouth 3 (three) times daily., Disp: 90 tablet, Rfl: 0 .  fluticasone (FLONASE) 50 MCG/ACT nasal spray, Place 2 sprays into both nostrils daily., Disp: 16 g, Rfl: 2 .  Fluticasone-Salmeterol (ADVAIR) 250-50 MCG/DOSE AEPB, Inhale 1 puff into the lungs every 12 (twelve) hours., Disp: 60 each, Rfl: 0 .  HYDROcodone-acetaminophen (NORCO) 10-325 MG tablet, Take 1 tablet by mouth every 6 (six) hours as needed., Disp: 120 tablet, Rfl: 0 .  ibuprofen (ADVIL,MOTRIN) 200 MG tablet, Take 200 mg by mouth every 6 (six) hours as needed for mild pain. , Disp: , Rfl:  .  Ipratropium-Albuterol (COMBIVENT) 20-100 MCG/ACT AERS respimat, Inhale  1 puff into the lungs every 6 (six) hours as needed for wheezing or shortness of breath., Disp: 1 Inhaler, Rfl: 2 .  pantoprazole (PROTONIX) 40 MG tablet, TAKE 1 TABLET BY MOUTH ONCE A DAY, Disp: 30 tablet, Rfl: 0 .  potassium chloride (K-DUR,KLOR-CON) 10 MEQ tablet, Take 1 tablet (10 mEq total) by mouth daily., Disp: 90 tablet, Rfl: 0 .  pregabalin (LYRICA) 75 MG capsule, Take 1 capsule (75 mg total) by mouth 3 (three) times daily., Disp: 90 capsule, Rfl: 0 .  QUEtiapine (SEROQUEL) 100 MG tablet, Take 1 tablet (100 mg total) by mouth at bedtime., Disp: 30 tablet, Rfl: 2  Allergies  Allergen Reactions  . Penicillins Rash    .penallergy     Review of Systems  Respiratory: Positive for shortness of breath.   Cardiovascular:  Positive for palpitations.  Musculoskeletal: Positive for back pain.  Neurological: Positive for tingling.  Psychiatric/Behavioral: The patient is nervous/anxious.     Objective  Filed Vitals:   07/16/15 1129  BP: 122/63  Pulse: 82  Temp: 97.6 F (36.4 C)  TempSrc: Oral  Resp: 17  Height: 5\' 3"  (1.6 m)  Weight: 125 lb 11.2 oz (57.017 kg)  SpO2: 95%    Physical Exam  Constitutional: She is oriented to person, place, and time and well-developed, well-nourished, and in no distress.  HENT:  Head: Normocephalic and atraumatic.  Nose: Right sinus exhibits maxillary sinus tenderness. Right sinus exhibits no frontal sinus tenderness. Left sinus exhibits maxillary sinus tenderness. Left sinus exhibits no frontal sinus tenderness.  Mouth/Throat: No posterior oropharyngeal erythema.  Turbinates normal sized.  Cardiovascular: Normal rate, regular rhythm, S1 normal and S2 normal.   No murmur heard. Pulmonary/Chest: Effort normal. No respiratory distress. She has wheezes in the right lower field and the left lower field. She has no rales.  Musculoskeletal:       Lumbar back: She exhibits tenderness, pain and spasm.       Back:  Neurological: She is alert and oriented to person, place, and time.  Nursing note and vitals reviewed.      Assessment & Plan  1. Chronic radicular low back pain Stable but moderate to severe low back pain, responsive to opioid therapy. Refills provided - HYDROcodone-acetaminophen (NORCO) 10-325 MG tablet; Take 1 tablet by mouth every 6 (six) hours as needed.  Dispense: 120 tablet; Refill: 0  2. Panic disorder with agoraphobia and severe panic attacks Patient with panic disorder on Xanax taken 3 times daily as needed, refills provided - alprazolam (XANAX) 2 MG tablet; Take 1 tablet (2 mg total) by mouth 3 (three) times daily as needed for anxiety.  Dispense: 90 tablet; Refill: 0  3. Peripheral sensory neuropathy (HCC)  - pregabalin (LYRICA) 75 MG  capsule; Take 1 capsule (75 mg total) by mouth 3 (three) times daily.  Dispense: 90 capsule; Refill: 0  4. Acute non-recurrent maxillary sinusitis  - doxycycline (VIBRA-TABS) 100 MG tablet; Take 1 tablet (100 mg total) by mouth 2 (two) times daily.  Dispense: 14 tablet; Refill: 0   Jaycee Pelzer Asad A[facility] 07/16/2015 11:37 AM

## 2015-07-19 IMAGING — US US EXTREM LOW VENOUS BILAT
1 series · 14 of 24 positions shown · non-contrast
Comparison: none

REASON FOR EXAM: lower ext edema
COMMENTS:

PROCEDURE:     US  - US DOPPLER LOW EXTR BILATERAL  - October 20, 2012  [DATE]
RESULT:     Comparison: None

[Series 1: us extrem low venous bilat · 0.07mm/px · 14 of 36 slices shown]
[im 1/36]
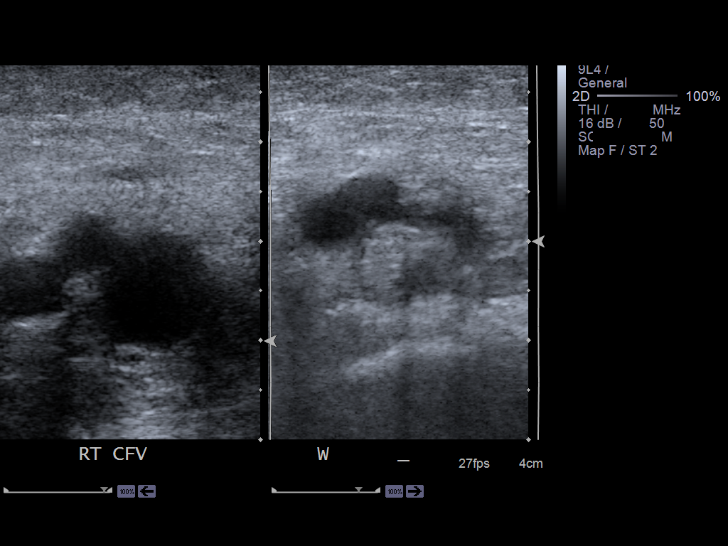
[im 4/36]
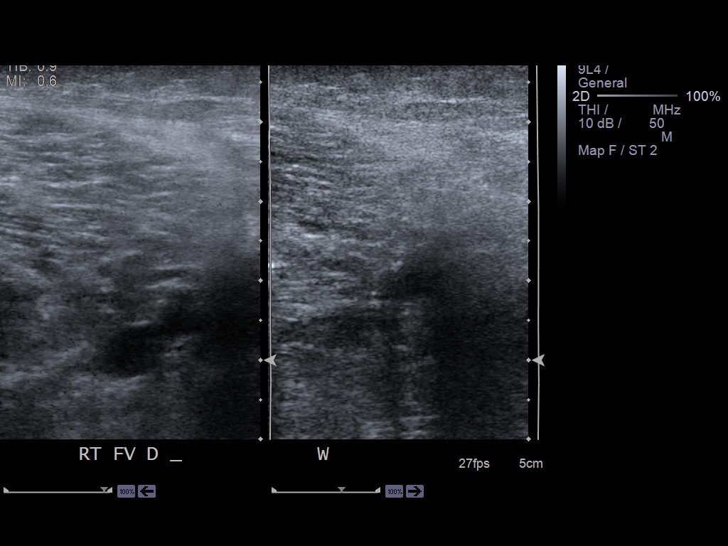
[im 7/36]
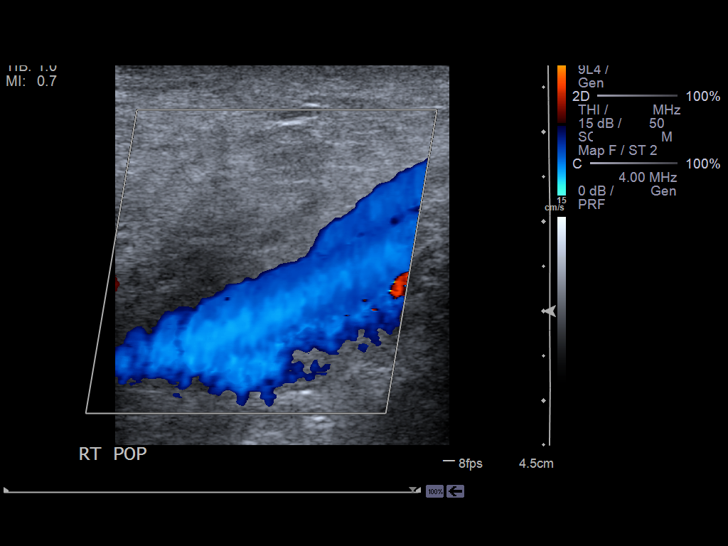
[im 10/36]
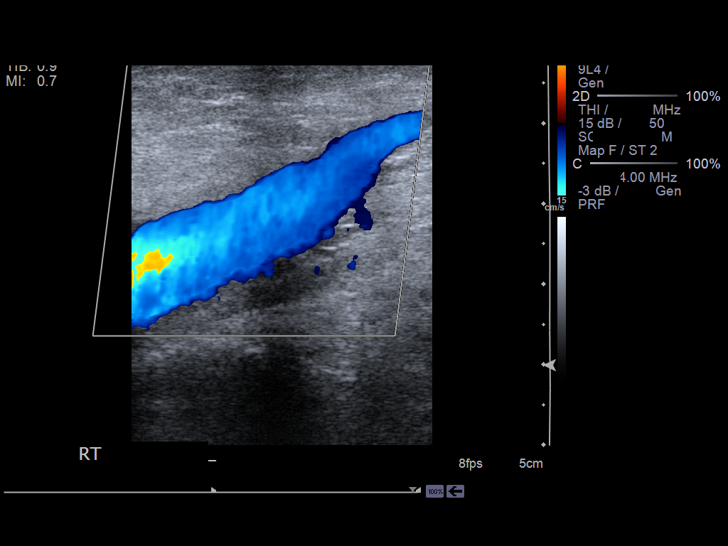
[im 11/36]
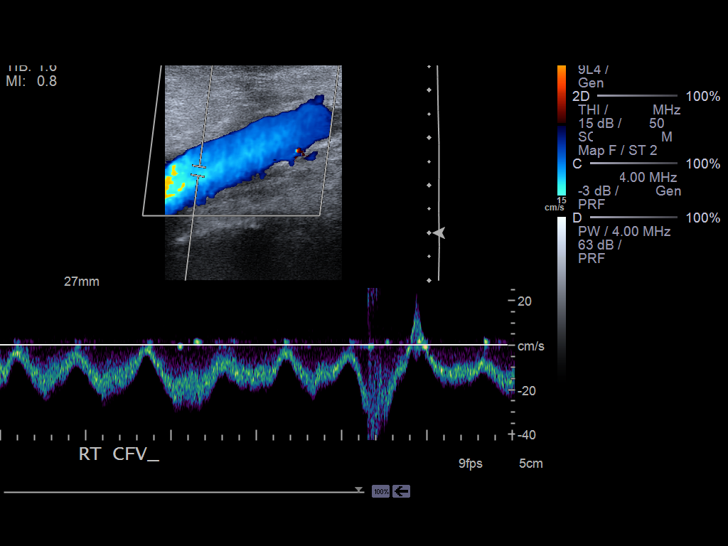
[im 14/36]
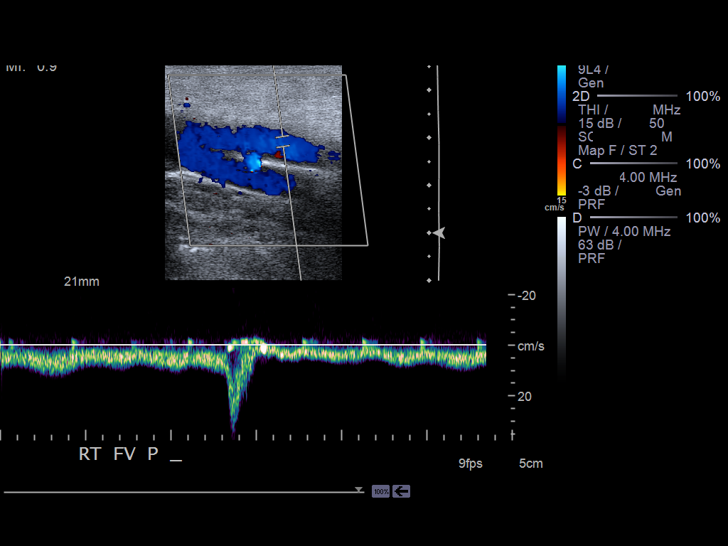
[im 17/36]
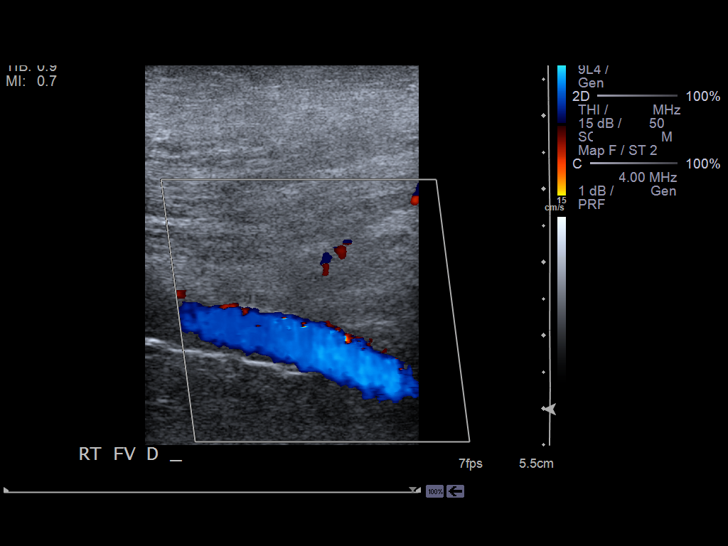
[im 19/36]
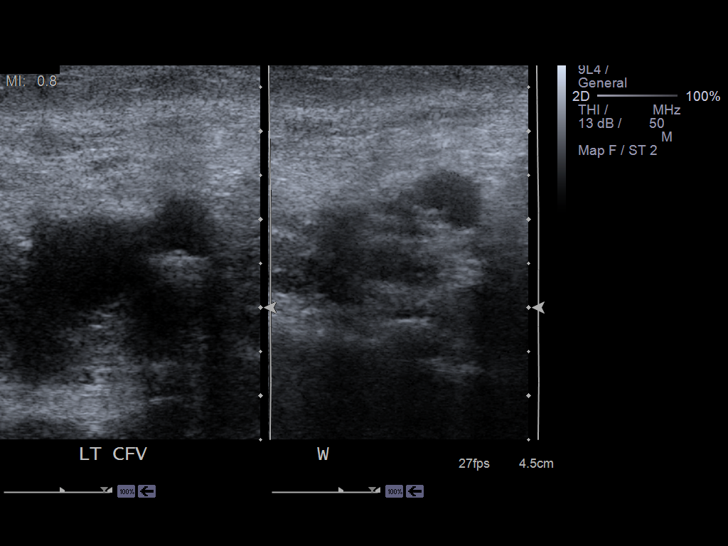
[im 22/36]
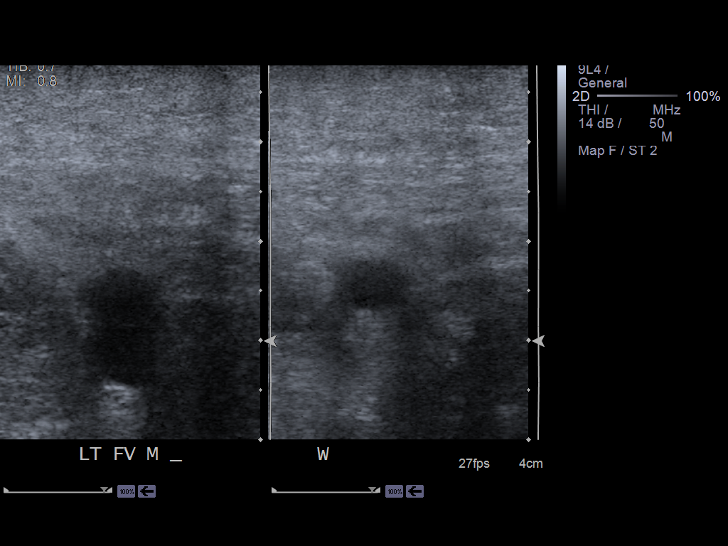
[im 25/36]
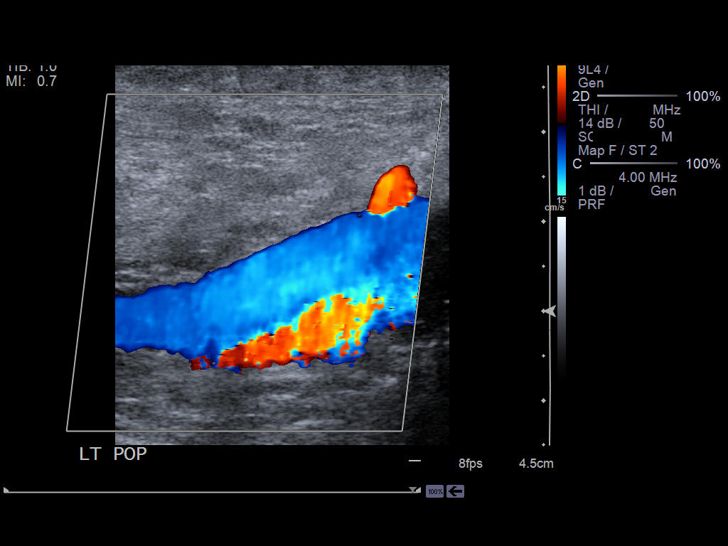
[im 28/36]
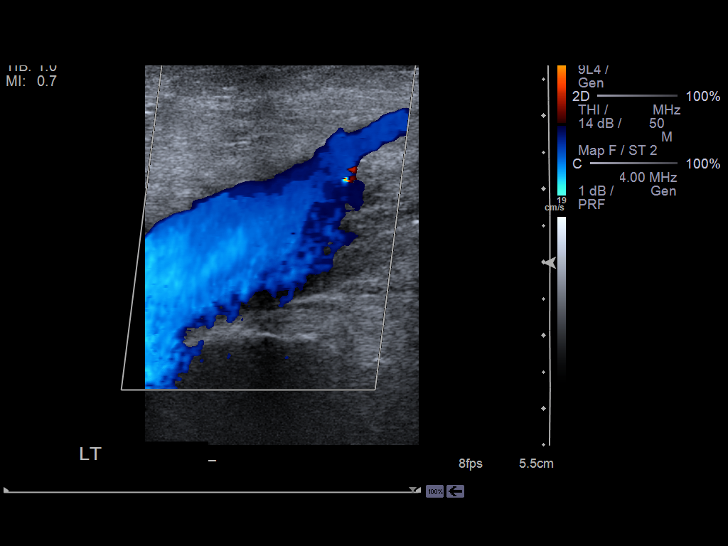
[im 29/36]
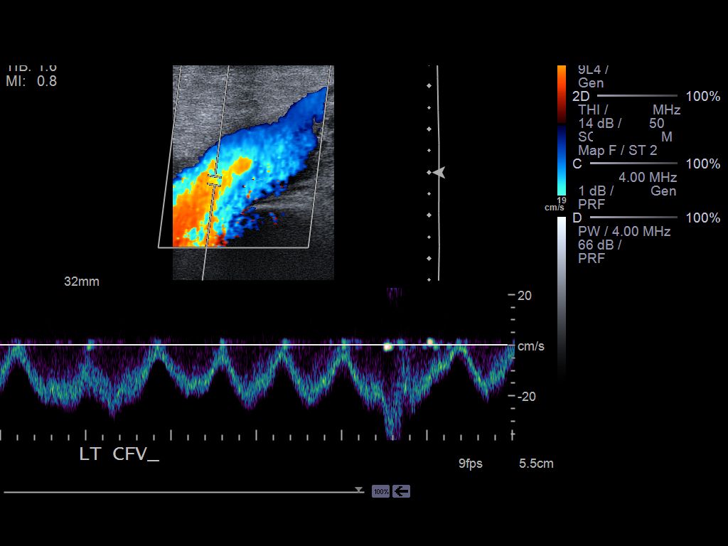
[im 32/36]
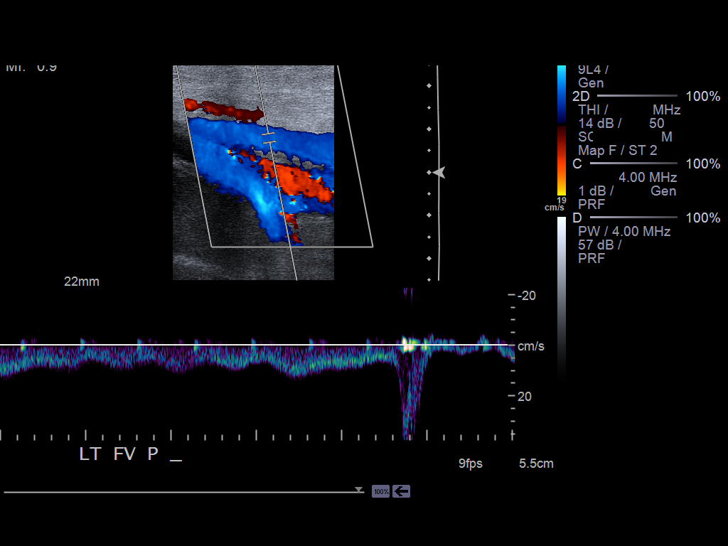
[im 36/36]
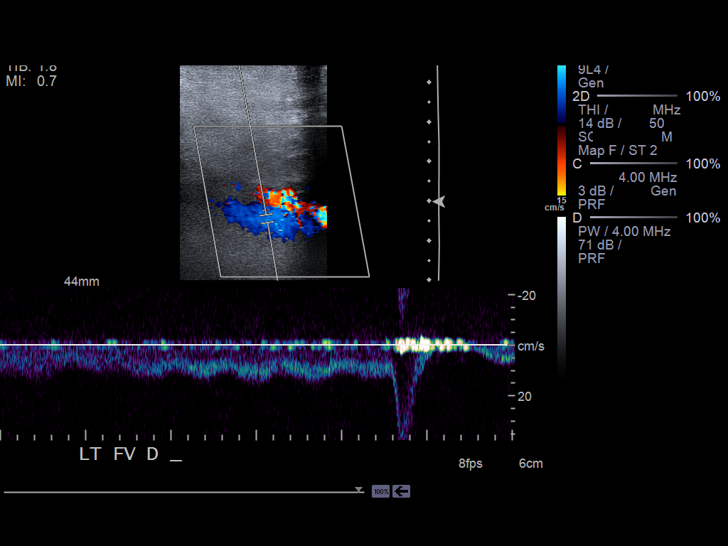

[14 of 24 positions shown; findings below may reference images not displayed]

FINDINGS: Multiple longitudinal and transverse gray-scale as well as color
and spectral Doppler images of  bilateral lower extremity veins were
obtained from the common femoral veins through the popliteal veins.

The right common femoral, greater saphenous, femoral, popliteal veins, and
venous trifurcation are patent, demonstrating normal color-flow and
compressibility. No intraluminal thrombus is identified.There is normal
respiratory variation and augmentation demonstrated at all vein levels.

The left common femoral, greater saphenous, femoral, popliteal veins, and
venous trifurcation are patent, demonstrating normal color-flow and
compressibility. No intraluminal thrombus is identified.There is normal
respiratory variation and augmentation demonstrated at all vein levels.
IMPRESSION: 1. No evidence of DVT in the right lower extremity.
2. No evidence of DVT in the left lower extremity.

[REDACTED]

## 2015-07-20 ENCOUNTER — Telehealth: Payer: Self-pay | Admitting: Family Medicine

## 2015-07-20 DIAGNOSIS — Z742 Need for assistance at home and no other household member able to render care: Secondary | ICD-10-CM | POA: Insufficient documentation

## 2015-07-20 DIAGNOSIS — H547 Unspecified visual loss: Secondary | ICD-10-CM | POA: Insufficient documentation

## 2015-07-20 NOTE — Telephone Encounter (Signed)
For home health referral, will request patient to come in for an appointment to satisfy the face-to-face requirements. Please schedule for an appointment on Friday, July 14 th. Ophthalmology referral is completed

## 2015-07-20 NOTE — Telephone Encounter (Deleted)
Patient needs two referrals as follows; Referral for home health: She is requesting assistance with bathing as it is harder for her to bathe due to peripheral neuropathy. Referral for ophthalmology: Requesting referral to be seen for decline in vision, states "I can't see very well "

## 2015-07-20 NOTE — Telephone Encounter (Signed)
Pt states she is still waiting to hear about Home Health coming in to help her. Please advise.

## 2015-07-21 NOTE — Telephone Encounter (Signed)
Pt informed

## 2015-07-22 ENCOUNTER — Telehealth: Payer: Self-pay

## 2015-07-22 NOTE — Telephone Encounter (Signed)
Agree with plan 

## 2015-07-22 NOTE — Telephone Encounter (Signed)
[place] called and would like permission to establish a [facility] provider for pt,please call [phone]

## 2015-08-17 ENCOUNTER — Ambulatory Visit (INDEPENDENT_AMBULATORY_CARE_PROVIDER_SITE_OTHER): Payer: Commercial Managed Care - HMO | Admitting: Family Medicine

## 2015-08-17 ENCOUNTER — Encounter: Payer: Self-pay | Admitting: Family Medicine

## 2015-08-17 DIAGNOSIS — F4001 Agoraphobia with panic disorder: Secondary | ICD-10-CM

## 2015-08-17 DIAGNOSIS — M5410 Radiculopathy, site unspecified: Secondary | ICD-10-CM | POA: Diagnosis not present

## 2015-08-17 DIAGNOSIS — G629 Polyneuropathy, unspecified: Secondary | ICD-10-CM | POA: Diagnosis not present

## 2015-08-17 DIAGNOSIS — G8929 Other chronic pain: Secondary | ICD-10-CM | POA: Diagnosis not present

## 2015-08-17 DIAGNOSIS — I10 Essential (primary) hypertension: Secondary | ICD-10-CM | POA: Diagnosis not present

## 2015-08-17 DIAGNOSIS — M5416 Radiculopathy, lumbar region: Principal | ICD-10-CM

## 2015-08-17 DIAGNOSIS — G4700 Insomnia, unspecified: Secondary | ICD-10-CM | POA: Diagnosis not present

## 2015-08-17 DIAGNOSIS — G608 Other hereditary and idiopathic neuropathies: Secondary | ICD-10-CM

## 2015-08-17 MED ORDER — AMLODIPINE BESYLATE 10 MG PO TABS: 10.0000 mg | ORAL_TABLET | Freq: Every day | ORAL | 1 refills | Status: DC

## 2015-08-17 MED ORDER — SERTRALINE HCL 25 MG PO TABS: 25.0000 mg | ORAL_TABLET | Freq: Every day | ORAL | 1 refills | Status: DC

## 2015-08-17 MED ORDER — QUETIAPINE FUMARATE 100 MG PO TABS: 100.0000 mg | ORAL_TABLET | Freq: Every day | ORAL | 0 refills | Status: DC

## 2015-08-17 MED ORDER — HYDROCODONE-ACETAMINOPHEN 10-325 MG PO TABS: 1.0000 | ORAL_TABLET | Freq: Four times a day (QID) | ORAL | 0 refills | Status: DC | PRN

## 2015-08-17 MED ORDER — ALPRAZOLAM 2 MG PO TABS: 2.0000 mg | ORAL_TABLET | Freq: Three times a day (TID) | ORAL | 0 refills | Status: DC | PRN

## 2015-08-17 MED ORDER — PREGABALIN 75 MG PO CAPS: 75.0000 mg | ORAL_CAPSULE | Freq: Three times a day (TID) | ORAL | 0 refills | Status: DC

## 2015-08-17 NOTE — Progress Notes (Signed)
Name: Tamara Mcclure   MRN: [id number]    DOB: 16-Oct-1955   Date:08/17/2015       Progress Note  Subjective  Chief Complaint  Chief Complaint  Patient presents with  . Medication Refill    Anxiety  Presents for follow-up visit. The problem has been gradually worsening. Symptoms include depressed mood, excessive worry, insomnia, irritability, nervous/anxious behavior and panic. Patient reports no dizziness or suicidal ideas. The severity of symptoms is causing significant distress and severe.   Her past medical history is significant for anxiety/panic attacks. Past treatments include benzodiazephines. The treatment provided moderate relief. Compliance with prior treatments has been good.  Back Pain  This is a chronic problem. The problem is unchanged. The pain is present in the lumbar spine. The pain does not radiate. The pain is at a severity of 8/10. The pain is severe. The pain is the same all the time. The symptoms are aggravated by bending and position. Associated symptoms include numbness (numbness in leg and feet, peripheral neuropathy.) and tingling. Pertinent negatives include no bladder incontinence, bowel incontinence or fever. She has tried analgesics (Lyrica and Hydrocodone for low back pain and  tingling in feet.) for the symptoms.  Insomnia  Primary symptoms: no difficulty falling asleep, frequent awakening, malaise/fatigue.  The onset quality is gradual. The symptoms are aggravated by anxiety and pain. Past treatments include medication. Typical bedtime:  8-10 P.M. (7 PM).  How long after going to bed to you fall asleep: 15-30 minutes.   PMH includes: depression.    Past Medical History:  Diagnosis Date  . Anxiety   . Chronic lower back pain   . COPD (chronic obstructive pulmonary disease) ([place])   . Depression   . Hypertension   . Panic disorder with agoraphobia and severe panic attacks   . Peripheral sensory neuropathy [facility])     Past Surgical History:  Procedure  Laterality Date  . none      Family History  Problem Relation Age of Onset  . Cancer Mother     Lung  . Cancer Father     pancreatic  . Breast cancer Neg Hx   . Ovarian cancer Neg Hx   . Colon cancer Neg Hx   . Diabetes Neg Hx   . Heart disease Neg Hx     Social History   Social History  . Marital status: Single    Spouse name: N/A  . Number of children: N/A  . Years of education: N/A   Occupational History  . Not on file.   Social History Main Topics  . Smoking status: Current Every Day Smoker    Packs/day: 1.00    Years: 30.00    Types: Cigarettes  . Smokeless tobacco: Never Used  . Alcohol use No  . Drug use: No  . Sexual activity: Not Currently   Other Topics Concern  . Not on file   Social History Narrative  . No narrative on file     Current Outpatient Prescriptions:  .  albuterol (PROVENTIL HFA;VENTOLIN HFA) 108 (90 Base) MCG/ACT inhaler, Inhale 2 puffs into the lungs every 6 (six) hours as needed for wheezing or shortness of breath., Disp: 1 Inhaler, Rfl: 1 .  alprazolam (XANAX) 2 MG tablet, Take 1 tablet (2 mg total) by mouth 3 (three) times daily as needed for anxiety., Disp: 90 tablet, Rfl: 0 .  amLODipine (NORVASC) 10 MG tablet, Take 1 tablet (10 mg total) by mouth daily., Disp: 90 tablet, Rfl: 1 .  carisoprodol (SOMA) 350 MG tablet, Take 1 tablet (350 mg total) by mouth 3 (three) times daily., Disp: 90 tablet, Rfl: 0 .  fluticasone (FLONASE) 50 MCG/ACT nasal spray, Place 2 sprays into both nostrils daily., Disp: 16 g, Rfl: 2 .  Fluticasone-Salmeterol (ADVAIR) 250-50 MCG/DOSE AEPB, Inhale 1 puff into the lungs every 12 (twelve) hours., Disp: 60 each, Rfl: 0 .  HYDROcodone-acetaminophen (NORCO) 10-325 MG tablet, Take 1 tablet by mouth every 6 (six) hours as needed., Disp: 120 tablet, Rfl: 0 .  ibuprofen (ADVIL,MOTRIN) 200 MG tablet, Take 200 mg by mouth every 6 (six) hours as needed for mild pain. , Disp: , Rfl:  .  Ipratropium-Albuterol (COMBIVENT)  20-100 MCG/ACT AERS respimat, Inhale 1 puff into the lungs every 6 (six) hours as needed for wheezing or shortness of breath., Disp: 1 Inhaler, Rfl: 2 .  pantoprazole (PROTONIX) 40 MG tablet, TAKE 1 TABLET BY MOUTH ONCE A DAY, Disp: 30 tablet, Rfl: 0 .  potassium chloride (K-DUR) 10 MEQ tablet, TAKE 1 TABLET BY MOUTH ONCE A DAY, Disp: 90 tablet, Rfl: 1 .  potassium chloride (K-DUR,KLOR-CON) 10 MEQ tablet, Take 1 tablet (10 mEq total) by mouth daily., Disp: 90 tablet, Rfl: 0 .  pregabalin (LYRICA) 75 MG capsule, Take 1 capsule (75 mg total) by mouth 3 (three) times daily., Disp: 90 capsule, Rfl: 0 .  QUEtiapine (SEROQUEL) 100 MG tablet, Take 1 tablet (100 mg total) by mouth at bedtime., Disp: 30 tablet, Rfl: 2 .  doxycycline (VIBRA-TABS) 100 MG tablet, Take 1 tablet (100 mg total) by mouth 2 (two) times daily. (Patient not taking: Reported on 08/17/2015), Disp: 14 tablet, Rfl: 0  Allergies  Allergen Reactions  . Penicillins Rash    .penallergy     Review of Systems  Constitutional: Positive for irritability and malaise/fatigue. Negative for fever.  Gastrointestinal: Negative for bowel incontinence.  Genitourinary: Negative for bladder incontinence.  Musculoskeletal: Positive for back pain.  Neurological: Positive for tingling and numbness (numbness in leg and feet, peripheral neuropathy.). Negative for dizziness.  Psychiatric/Behavioral: Positive for depression. Negative for suicidal ideas. The patient is nervous/anxious and has insomnia.      Objective  Vitals:   08/17/15 1050  BP: 120/67  Pulse: 100  Resp: 16  Temp: 98.6 F (37 C)  TempSrc: Oral  SpO2: 97%  Weight: 120 lb 4.8 oz (54.6 kg)  Height: 5\' 3"  (1.6 m)    Physical Exam  Constitutional: She is oriented to person, place, and time and well-developed, well-nourished, and in no distress.  HENT:  Head: Normocephalic and atraumatic.  Cardiovascular: Normal rate and regular rhythm.   No murmur heard. Pulmonary/Chest:  Effort normal and breath sounds normal. She has no wheezes.  Musculoskeletal:       Lumbar back: She exhibits tenderness, pain and spasm.       Back:  Neurological: She is alert and oriented to person, place, and time.  Psychiatric: Judgment normal. Her mood appears anxious. She exhibits a depressed mood. She expresses no suicidal ideation. She has a flat affect.  Nursing note and vitals reviewed.    Assessment & Plan  1. Chronic radicular low back pain Controlled on hydrocodone 10 mg taken up to 4 times daily as needed. Patient compliant with controlled substances agreement. - HYDROcodone-acetaminophen (NORCO) 10-325 MG tablet; Take 1 tablet by mouth every 6 (six) hours as needed.  Dispense: 120 tablet; Refill: 0  2. Essential hypertension  - amLODipine (NORVASC) 10 MG tablet; Take 1 tablet (10 mg  total) by mouth daily.  Dispense: 90 tablet; Refill: 1  3. Insomnia  - QUEtiapine (SEROQUEL) 100 MG tablet; Take 1 tablet (100 mg total) by mouth at bedtime.  Dispense: 90 tablet; Refill: 0  4. Panic disorder with agoraphobia and severe panic attacks We'll start on low-dose SSRI to augment benzodiazepine and improve the symptoms of anxiety - alprazolam (XANAX) 2 MG tablet; Take 1 tablet (2 mg total) by mouth 3 (three) times daily as needed for anxiety.  Dispense: 90 tablet; Refill: 0 - sertraline (ZOLOFT) 25 MG tablet; Take 1 tablet (25 mg total) by mouth at bedtime.  Dispense: 90 tablet; Refill: 1  5. Peripheral sensory neuropathy (HCC)  - pregabalin (LYRICA) 75 MG capsule; Take 1 capsule (75 mg total) by mouth 3 (three) times daily.  Dispense: 90 capsule; Refill: 0   Kareema Keitt Asad A[facility] 08/17/2015 11:01 AM

## 2015-08-21 ENCOUNTER — Telehealth: Payer: Self-pay | Admitting: Family Medicine

## 2015-08-21 DIAGNOSIS — M79604 Pain in right leg: Secondary | ICD-10-CM

## 2015-08-21 DIAGNOSIS — M79605 Pain in left leg: Principal | ICD-10-CM

## 2015-08-28 ENCOUNTER — Other Ambulatory Visit: Payer: Self-pay

## 2015-08-28 DIAGNOSIS — M79604 Pain in right leg: Secondary | ICD-10-CM

## 2015-08-28 DIAGNOSIS — M79605 Pain in left leg: Principal | ICD-10-CM

## 2015-08-28 MED ORDER — CARISOPRODOL 350 MG PO TABS: 350.0000 mg | ORAL_TABLET | Freq: Three times a day (TID) | ORAL | 0 refills | Status: DC

## 2015-08-28 NOTE — Telephone Encounter (Signed)
Prescription for Tamara Mcclure is ready for pickup

## 2015-08-28 NOTE — Telephone Encounter (Signed)
COMPLETED

## 2015-08-28 NOTE — Telephone Encounter (Signed)
Pt.notified

## 2015-08-28 NOTE — Addendum Note (Signed)
Addended byManuella Ghazi, Kamauri Kathol A A on: 08/28/2015 02:48 PM   Modules accepted: Orders

## 2015-08-31 ENCOUNTER — Telehealth: Payer: Self-pay | Admitting: Family Medicine

## 2015-08-31 NOTE — Telephone Encounter (Signed)
She'll experiencing irritability, waking up and sweats and shakes, happening since she was started on sertraline 25 mg daily 3 weeks ago. Explained that this could potentially be symptoms of serotonin syndrome and to discontinue the medication. Should follow up to consider starting on a different agent outside of the SSRI and SNRI class

## 2015-09-01 NOTE — Telephone Encounter (Signed)
PT MADE AWARE AND PATIENT SAYS DR CALLED HER ALSO.

## 2015-09-15 ENCOUNTER — Encounter: Payer: Self-pay | Admitting: Family Medicine

## 2015-09-15 ENCOUNTER — Ambulatory Visit (INDEPENDENT_AMBULATORY_CARE_PROVIDER_SITE_OTHER): Payer: Commercial Managed Care - HMO | Admitting: Family Medicine

## 2015-09-15 ENCOUNTER — Telehealth: Payer: Self-pay | Admitting: Family Medicine

## 2015-09-15 DIAGNOSIS — G608 Other hereditary and idiopathic neuropathies: Secondary | ICD-10-CM

## 2015-09-15 DIAGNOSIS — G4700 Insomnia, unspecified: Secondary | ICD-10-CM | POA: Diagnosis not present

## 2015-09-15 DIAGNOSIS — M5410 Radiculopathy, site unspecified: Secondary | ICD-10-CM

## 2015-09-15 DIAGNOSIS — M5416 Radiculopathy, lumbar region: Principal | ICD-10-CM

## 2015-09-15 DIAGNOSIS — F4001 Agoraphobia with panic disorder: Secondary | ICD-10-CM

## 2015-09-15 DIAGNOSIS — G629 Polyneuropathy, unspecified: Principal | ICD-10-CM

## 2015-09-15 DIAGNOSIS — G8929 Other chronic pain: Secondary | ICD-10-CM

## 2015-09-15 MED ORDER — QUETIAPINE FUMARATE 100 MG PO TABS: 100.0000 mg | ORAL_TABLET | Freq: Every day | ORAL | 0 refills | Status: DC

## 2015-09-15 MED ORDER — ESCITALOPRAM OXALATE 5 MG PO TABS: 5.0000 mg | ORAL_TABLET | Freq: Every day | ORAL | 0 refills | Status: DC

## 2015-09-15 MED ORDER — HYDROCODONE-ACETAMINOPHEN 10-325 MG PO TABS: 1.0000 | ORAL_TABLET | Freq: Four times a day (QID) | ORAL | 0 refills | Status: DC | PRN

## 2015-09-15 MED ORDER — ALPRAZOLAM 2 MG PO TABS: 2.0000 mg | ORAL_TABLET | Freq: Three times a day (TID) | ORAL | 0 refills | Status: DC | PRN

## 2015-09-15 NOTE — Progress Notes (Signed)
Name: Tamara Mcclure   MRN: [id number]    DOB: 1955/10/07   Date:09/15/2015       Progress Note  Subjective  Chief Complaint  Chief Complaint  Patient presents with  . Medication Refill    Anxiety  Presents for follow-up visit. The problem has been gradually worsening. Symptoms include depressed mood, excessive worry, insomnia, irritability, nervous/anxious behavior and panic. Patient reports no dizziness or suicidal ideas. The severity of symptoms is causing significant distress and severe.   Her past medical history is significant for anxiety/panic attacks. Past treatments include benzodiazephines. The treatment provided moderate relief. Compliance with prior treatments has been good.  Back Pain  This is a chronic problem. The problem is unchanged. The pain is present in the lumbar spine. The pain does not radiate. The pain is at a severity of 7/10. The pain is severe. The pain is the same all the time. The symptoms are aggravated by bending and position. Associated symptoms include numbness (numbness, burning, and aching  in feet, peripheral neuropathy.) and tingling. Pertinent negatives include no bladder incontinence, bowel incontinence or fever. She has tried analgesics (Lyrica and Hydrocodone for low back pain and  tingling in feet.) for the symptoms.  Insomnia  Primary symptoms: no difficulty falling asleep, frequent awakening, malaise/fatigue.  The onset quality is gradual. The symptoms are aggravated by anxiety and pain. Past treatments include medication. Typical bedtime:  8-10 P.M. (7 PM).  How long after going to bed to you fall asleep: 15-30 minutes.   PMH includes: depression.    Past Medical History:  Diagnosis Date  . Anxiety   . Chronic lower back pain   . COPD (chronic obstructive pulmonary disease) ([place])   . Depression   . Hypertension   . Panic disorder with agoraphobia and severe panic attacks   . Peripheral sensory neuropathy [facility])     Past Surgical History:    Procedure Laterality Date  . none      Family History  Problem Relation Age of Onset  . Cancer Mother     Lung  . Cancer Father     pancreatic  . Breast cancer Neg Hx   . Ovarian cancer Neg Hx   . Colon cancer Neg Hx   . Diabetes Neg Hx   . Heart disease Neg Hx     Social History   Social History  . Marital status: Single    Spouse name: N/A  . Number of children: N/A  . Years of education: N/A   Occupational History  . Not on file.   Social History Main Topics  . Smoking status: Current Every Day Smoker    Packs/day: 1.00    Years: 30.00    Types: Cigarettes  . Smokeless tobacco: Never Used  . Alcohol use No  . Drug use: No  . Sexual activity: Not Currently   Other Topics Concern  . Not on file   Social History Narrative  . No narrative on file     Current Outpatient Prescriptions:  .  albuterol (PROVENTIL HFA;VENTOLIN HFA) 108 (90 Base) MCG/ACT inhaler, Inhale 2 puffs into the lungs every 6 (six) hours as needed for wheezing or shortness of breath., Disp: 1 Inhaler, Rfl: 1 .  alprazolam (XANAX) 2 MG tablet, Take 1 tablet (2 mg total) by mouth 3 (three) times daily as needed for anxiety., Disp: 90 tablet, Rfl: 0 .  amLODipine (NORVASC) 10 MG tablet, Take 1 tablet (10 mg total) by mouth daily., Disp: 90  tablet, Rfl: 1 .  carisoprodol (SOMA) 350 MG tablet, Take 1 tablet (350 mg total) by mouth 3 (three) times daily., Disp: 90 tablet, Rfl: 0 .  fluticasone (FLONASE) 50 MCG/ACT nasal spray, Place 2 sprays into both nostrils daily., Disp: 16 g, Rfl: 2 .  Fluticasone-Salmeterol (ADVAIR) 250-50 MCG/DOSE AEPB, Inhale 1 puff into the lungs every 12 (twelve) hours., Disp: 60 each, Rfl: 0 .  HYDROcodone-acetaminophen (NORCO) 10-325 MG tablet, Take 1 tablet by mouth every 6 (six) hours as needed., Disp: 120 tablet, Rfl: 0 .  ibuprofen (ADVIL,MOTRIN) 200 MG tablet, Take 200 mg by mouth every 6 (six) hours as needed for mild pain. , Disp: , Rfl:  .  Ipratropium-Albuterol  (COMBIVENT) 20-100 MCG/ACT AERS respimat, Inhale 1 puff into the lungs every 6 (six) hours as needed for wheezing or shortness of breath., Disp: 1 Inhaler, Rfl: 2 .  pantoprazole (PROTONIX) 40 MG tablet, TAKE 1 TABLET BY MOUTH ONCE A DAY, Disp: 30 tablet, Rfl: 0 .  potassium chloride (K-DUR) 10 MEQ tablet, TAKE 1 TABLET BY MOUTH ONCE A DAY, Disp: 90 tablet, Rfl: 1 .  potassium chloride (K-DUR,KLOR-CON) 10 MEQ tablet, Take 1 tablet (10 mEq total) by mouth daily., Disp: 90 tablet, Rfl: 0 .  pregabalin (LYRICA) 75 MG capsule, Take 1 capsule (75 mg total) by mouth 3 (three) times daily., Disp: 90 capsule, Rfl: 0 .  QUEtiapine (SEROQUEL) 100 MG tablet, Take 1 tablet (100 mg total) by mouth at bedtime., Disp: 90 tablet, Rfl: 0 .  sertraline (ZOLOFT) 25 MG tablet, Take 1 tablet (25 mg total) by mouth at bedtime., Disp: 90 tablet, Rfl: 1  Allergies  Allergen Reactions  . Penicillins Rash    .penallergy     Review of Systems  Constitutional: Positive for irritability and malaise/fatigue. Negative for fever.  Gastrointestinal: Negative for bowel incontinence.  Genitourinary: Negative for bladder incontinence.  Musculoskeletal: Positive for back pain.  Neurological: Positive for tingling and numbness (numbness, burning, and aching  in feet, peripheral neuropathy.). Negative for dizziness.  Psychiatric/Behavioral: Positive for depression. Negative for suicidal ideas. The patient is nervous/anxious and has insomnia.      Objective  Vitals:   09/15/15 1035  BP: 120/64  Pulse: 81  Resp: 18  Temp: 97.6 F (36.4 C)  SpO2: 92%  Weight: 121 lb 4 oz (55 kg)  Height: 5\' 3"  (1.6 m)    Physical Exam  Constitutional: She is oriented to person, place, and time and well-developed, well-nourished, and in no distress.  HENT:  Head: Normocephalic and atraumatic.  Cardiovascular: Normal rate, regular rhythm, S1 normal and S2 normal.   No murmur heard. Pulmonary/Chest: Effort normal. She has wheezes in  the left middle field and the left lower field.  Musculoskeletal:       Lumbar back: She exhibits tenderness, pain and spasm.       Back:  Neurological: She is alert and oriented to person, place, and time.  Psychiatric: Judgment normal. Her mood appears anxious. She does not exhibit a depressed mood. She expresses no suicidal ideation. She has a flat affect.  Nursing note and vitals reviewed.   Assessment & Plan  1. Chronic radicular low back pain Unchanged, responsive to opioid therapy. Continue on hydrocodone as prescribed, refills provided. - HYDROcodone-acetaminophen (NORCO) 10-325 MG tablet; Take 1 tablet by mouth every 6 (six) hours as needed.  Dispense: 120 tablet; Refill: 0  2. Insomnia Refill for quetiapine provided - QUEtiapine (SEROQUEL) 100 MG tablet; Take 1 tablet (100  mg total) by mouth at bedtime.  Dispense: 90 tablet; Refill: 0  3. Panic disorder with agoraphobia and severe panic attacks Patient was started on sertraline for anxiety disorder, could not take it because of side effects. We'll start on Lexapro 5 mg daily, refills provided. Continue on alprazolam as prescribed. Compliant with controlled substances agreement. - alprazolam (XANAX) 2 MG tablet; Take 1 tablet (2 mg total) by mouth 3 (three) times daily as needed for anxiety.  Dispense: 90 tablet; Refill: 0 - escitalopram (LEXAPRO) 5 MG tablet; Take 1 tablet (5 mg total) by mouth daily.  Dispense: 90 tablet; Refill: 0   Zyree Traynham [facility] 09/15/2015 11:01 AM

## 2015-09-15 NOTE — Telephone Encounter (Signed)
Patient was seen in office today (09/15/15) and forgot to request a refill on Lyrica.  Patient is needing a refill on her Lyrica medication.  Please call once complete.

## 2015-09-16 MED ORDER — PREGABALIN 75 MG PO CAPS: 75.0000 mg | ORAL_CAPSULE | Freq: Three times a day (TID) | ORAL | 0 refills | Status: DC

## 2015-09-16 NOTE — Telephone Encounter (Signed)
Prescription for Lyrica is printed and ready for pickup 

## 2015-09-22 ENCOUNTER — Telehealth: Payer: Self-pay | Admitting: Family Medicine

## 2015-09-24 NOTE — Telephone Encounter (Signed)
It is possible that the symptoms described could be a result of serotonin syndrome, which is a well-known side effect of post serotonin enhancers such as Lexapro. She can discontinue Lexapro and will follow-up at her scheduled appointment to consider a different medication.

## 2015-09-24 NOTE — Telephone Encounter (Signed)
Routed to Dr. Manuella Ghazi for pt advice and return call

## 2015-09-25 NOTE — Telephone Encounter (Signed)
Patient has been notified to discontinue Lexapro and will follow-up at her scheduled appointment to consider a different medication. Per Dr. Manuella Ghazi

## 2015-10-06 ENCOUNTER — Telehealth: Payer: Self-pay | Admitting: Family Medicine

## 2015-10-06 NOTE — Telephone Encounter (Signed)
Patient wishes to restart back on Lexapro, states that anxiety is worse. She was taken off of Lexapro because of side effects including possible serotonin syndrome. Advised to hold off on Lexapro and will address her symptoms at her upcoming appointment. In addition, patient will need an acute visit appointment for nasal congestion, we will see her on Friday the 22nd at 8 AM, patient will call tomorrow to get an appointment.

## 2015-10-06 NOTE — Telephone Encounter (Signed)
Pt would like back about her lexapro.

## 2015-10-13 ENCOUNTER — Ambulatory Visit (INDEPENDENT_AMBULATORY_CARE_PROVIDER_SITE_OTHER): Payer: Commercial Managed Care - HMO | Admitting: Family Medicine

## 2015-10-13 ENCOUNTER — Encounter: Payer: Self-pay | Admitting: Family Medicine

## 2015-10-13 VITALS — BP 116/76 | HR 86 | Temp 97.7°F | Wt 121.2 lb

## 2015-10-13 DIAGNOSIS — J0100 Acute maxillary sinusitis, unspecified: Secondary | ICD-10-CM | POA: Diagnosis not present

## 2015-10-13 DIAGNOSIS — F4001 Agoraphobia with panic disorder: Secondary | ICD-10-CM

## 2015-10-13 DIAGNOSIS — G8929 Other chronic pain: Secondary | ICD-10-CM

## 2015-10-13 DIAGNOSIS — G608 Other hereditary and idiopathic neuropathies: Secondary | ICD-10-CM

## 2015-10-13 DIAGNOSIS — G629 Polyneuropathy, unspecified: Secondary | ICD-10-CM | POA: Diagnosis not present

## 2015-10-13 DIAGNOSIS — M5416 Radiculopathy, lumbar region: Secondary | ICD-10-CM

## 2015-10-13 MED ORDER — PREGABALIN 75 MG PO CAPS: 75.0000 mg | ORAL_CAPSULE | Freq: Three times a day (TID) | ORAL | 0 refills | Status: DC

## 2015-10-13 MED ORDER — ESCITALOPRAM OXALATE 10 MG PO TABS: 10.0000 mg | ORAL_TABLET | Freq: Every day | ORAL | 0 refills | Status: DC

## 2015-10-13 MED ORDER — DOXYCYCLINE HYCLATE 100 MG PO TABS: 100.0000 mg | ORAL_TABLET | Freq: Two times a day (BID) | ORAL | 0 refills | Status: DC

## 2015-10-13 MED ORDER — HYDROCODONE-ACETAMINOPHEN 10-325 MG PO TABS: 1.0000 | ORAL_TABLET | Freq: Four times a day (QID) | ORAL | 0 refills | Status: DC | PRN

## 2015-10-13 MED ORDER — ALPRAZOLAM 2 MG PO TABS: 2.0000 mg | ORAL_TABLET | Freq: Three times a day (TID) | ORAL | 0 refills | Status: DC | PRN

## 2015-10-13 NOTE — Progress Notes (Signed)
Name: Tamara Mcclure   MRN: [id number]    DOB: 12/30/1955   Date:10/13/2015       Progress Note  Subjective  Chief Complaint  Chief Complaint  Patient presents with  . Follow-up    Anxiety  Presents for follow-up visit. The problem has been gradually worsening. Symptoms include depressed mood, excessive worry, irritability, nervous/anxious behavior and panic. Patient reports no dizziness or suicidal ideas. The severity of symptoms is causing significant distress and severe.   Her past medical history is significant for anxiety/panic attacks. Past treatments include benzodiazephines. The treatment provided moderate relief. Compliance with prior treatments has been good.  Back Pain  This is a chronic problem. The problem is unchanged. The pain is present in the lumbar spine. The pain does not radiate. The pain is at a severity of 7/10. The pain is severe. The pain is the same all the time. The symptoms are aggravated by bending and position. Associated symptoms include numbness (numbness, burning, and aching  in feet, peripheral neuropathy.) and tingling. Pertinent negatives include no bladder incontinence, bowel incontinence or fever. She has tried analgesics (Lyrica and Hydrocodone for low back pain and  tingling in feet.) for the symptoms.  Sinusitis  This is a new problem. Episode onset: 3 weeks ago. There has been no fever. Associated symptoms include congestion, coughing, ear pain and sinus pressure. Past treatments include nothing.    Past Medical History:  Diagnosis Date  . Anxiety   . Chronic lower back pain   . COPD (chronic obstructive pulmonary disease) ([place])   . Depression   . Hypertension   . Panic disorder with agoraphobia and severe panic attacks   . Peripheral sensory neuropathy [facility])     Past Surgical History:  Procedure Laterality Date  . none      Family History  Problem Relation Age of Onset  . Cancer Mother     Lung  . Cancer Father     pancreatic  .  Breast cancer Neg Hx   . Ovarian cancer Neg Hx   . Colon cancer Neg Hx   . Diabetes Neg Hx   . Heart disease Neg Hx     Social History   Social History  . Marital status: Single    Spouse name: N/A  . Number of children: N/A  . Years of education: N/A   Occupational History  . Not on file.   Social History Main Topics  . Smoking status: Current Every Day Smoker    Packs/day: 1.00    Years: 30.00    Types: Cigarettes  . Smokeless tobacco: Never Used  . Alcohol use No  . Drug use: No  . Sexual activity: Not Currently   Other Topics Concern  . Not on file   Social History Narrative  . No narrative on file     Current Outpatient Prescriptions:  .  albuterol (PROVENTIL HFA;VENTOLIN HFA) 108 (90 Base) MCG/ACT inhaler, Inhale 2 puffs into the lungs every 6 (six) hours as needed for wheezing or shortness of breath., Disp: 1 Inhaler, Rfl: 1 .  alprazolam (XANAX) 2 MG tablet, Take 1 tablet (2 mg total) by mouth 3 (three) times daily as needed for anxiety., Disp: 90 tablet, Rfl: 0 .  amLODipine (NORVASC) 10 MG tablet, Take 1 tablet (10 mg total) by mouth daily., Disp: 90 tablet, Rfl: 1 .  carisoprodol (SOMA) 350 MG tablet, Take 1 tablet (350 mg total) by mouth 3 (three) times daily., Disp: 90 tablet, Rfl:  0 .  fluticasone (FLONASE) 50 MCG/ACT nasal spray, Place 2 sprays into both nostrils daily., Disp: 16 g, Rfl: 2 .  Fluticasone-Salmeterol (ADVAIR) 250-50 MCG/DOSE AEPB, Inhale 1 puff into the lungs every 12 (twelve) hours., Disp: 60 each, Rfl: 0 .  HYDROcodone-acetaminophen (NORCO) 10-325 MG tablet, Take 1 tablet by mouth every 6 (six) hours as needed., Disp: 120 tablet, Rfl: 0 .  ibuprofen (ADVIL,MOTRIN) 200 MG tablet, Take 200 mg by mouth every 6 (six) hours as needed for mild pain. , Disp: , Rfl:  .  Ipratropium-Albuterol (COMBIVENT) 20-100 MCG/ACT AERS respimat, Inhale 1 puff into the lungs every 6 (six) hours as needed for wheezing or shortness of breath., Disp: 1 Inhaler,  Rfl: 2 .  pantoprazole (PROTONIX) 40 MG tablet, TAKE 1 TABLET BY MOUTH ONCE A DAY, Disp: 30 tablet, Rfl: 0 .  potassium chloride (K-DUR) 10 MEQ tablet, TAKE 1 TABLET BY MOUTH ONCE A DAY, Disp: 90 tablet, Rfl: 1 .  potassium chloride (K-DUR,KLOR-CON) 10 MEQ tablet, Take 1 tablet (10 mEq total) by mouth daily., Disp: 90 tablet, Rfl: 0 .  pregabalin (LYRICA) 75 MG capsule, Take 1 capsule (75 mg total) by mouth 3 (three) times daily., Disp: 90 capsule, Rfl: 0 .  QUEtiapine (SEROQUEL) 100 MG tablet, Take 1 tablet (100 mg total) by mouth at bedtime., Disp: 90 tablet, Rfl: 0 .  escitalopram (LEXAPRO) 5 MG tablet, Take 1 tablet (5 mg total) by mouth daily. (Patient not taking: Reported on 10/13/2015), Disp: 90 tablet, Rfl: 0 .  sertraline (ZOLOFT) 25 MG tablet, Take 1 tablet (25 mg total) by mouth at bedtime. (Patient not taking: Reported on 10/13/2015), Disp: 90 tablet, Rfl: 1  Allergies  Allergen Reactions  . Penicillins Rash    .penallergy     Review of Systems  Constitutional: Positive for irritability. Negative for fever.  HENT: Positive for congestion, ear pain and sinus pressure.   Respiratory: Positive for cough.   Gastrointestinal: Negative for bowel incontinence.  Genitourinary: Negative for bladder incontinence.  Musculoskeletal: Positive for back pain.  Neurological: Positive for tingling and numbness (numbness, burning, and aching  in feet, peripheral neuropathy.). Negative for dizziness.  Psychiatric/Behavioral: Negative for suicidal ideas. The patient is nervous/anxious.     Objective  Vitals:   10/13/15 1134  BP: 116/76  Pulse: 86  Temp: 97.7 F (36.5 C)  SpO2: 99%  Weight: 121 lb 3.2 oz (55 kg)    Physical Exam  Constitutional: She is oriented to person, place, and time and well-developed, well-nourished, and in no distress.  HENT:  Head: Normocephalic and atraumatic.  Nose: Right sinus exhibits maxillary sinus tenderness and frontal sinus tenderness. Left sinus  exhibits maxillary sinus tenderness and frontal sinus tenderness.  Mouth/Throat: No posterior oropharyngeal erythema.  Left ear with fluid, right ear normal, TM gray  Cardiovascular: Normal rate and regular rhythm.   Pulmonary/Chest: She has wheezes in the right middle field, the right lower field, the left middle field and the left lower field.  Musculoskeletal:       Lumbar back: She exhibits tenderness, pain and spasm.  Neurological: She is alert and oriented to person, place, and time.  Psychiatric: Memory and judgment normal. Her mood appears anxious. She has a flat affect.  Nursing note and vitals reviewed.    Assessment & Plan  1. Chronic radicular low back pain Stable and responsive to opioid therapy. Patient compliant with controlled substances agreement. Understands the dependence potential, side effects, and drug interactions of opioids. Refills provided -  HYDROcodone-acetaminophen (NORCO) 10-325 MG tablet; Take 1 tablet by mouth every 6 (six) hours as needed.  Dispense: 120 tablet; Refill: 0  2. Panic disorder with agoraphobia and severe panic attacks Severe and causing significant distress, continue on alprazolam, we will restart Lexapro 10 mg daily. Encouraged to report any possible adverse effects from Lexapro. - alprazolam (XANAX) 2 MG tablet; Take 1 tablet (2 mg total) by mouth 3 (three) times daily as needed for anxiety.  Dispense: 90 tablet; Refill: 0 - escitalopram (LEXAPRO) 10 MG tablet; Take 1 tablet (10 mg total) by mouth daily.  Dispense: 90 tablet; Refill: 0  3. Peripheral sensory neuropathy ([facility])  - pregabalin (LYRICA) 75 MG capsule; Take 1 capsule (75 mg total) by mouth 3 (three) times daily.  Dispense: 90 capsule; Refill: 0  4. Acute maxillary sinusitis, recurrence not specified  - doxycycline (VIBRA-TABS) 100 MG tablet; Take 1 tablet (100 mg total) by mouth 2 (two) times daily.  Dispense: 10 tablet; Refill: 0   Candace Ramus [facility] 10/13/2015 11:58 AM

## 2015-10-15 ENCOUNTER — Telehealth: Payer: Self-pay | Admitting: Family Medicine

## 2015-10-15 DIAGNOSIS — J0100 Acute maxillary sinusitis, unspecified: Secondary | ICD-10-CM

## 2015-10-15 DIAGNOSIS — R0981 Nasal congestion: Secondary | ICD-10-CM

## 2015-10-15 MED ORDER — CLINDAMYCIN HCL 300 MG PO CAPS: 300.0000 mg | ORAL_CAPSULE | Freq: Three times a day (TID) | ORAL | 0 refills | Status: DC

## 2015-10-15 NOTE — Telephone Encounter (Signed)
Have DC'd doxycycline and started on clindamycin 300 mg 3 times a day, if the symptoms are from side effects of doxycycline, they should go away after discontinuation

## 2015-10-15 NOTE — Telephone Encounter (Signed)
Pt states she was recently put on Doxycycline and she thinks this medication is  making her sick to her stomach.She says she has had the jitters as well. She also states she couldn't feel her feet and it was hard for her to walk. She thinks it could have interfered with the Lyrica that she is on. Please return her call.

## 2015-10-16 ENCOUNTER — Other Ambulatory Visit: Payer: Self-pay | Admitting: Family Medicine

## 2015-10-16 DIAGNOSIS — J449 Chronic obstructive pulmonary disease, unspecified: Secondary | ICD-10-CM

## 2015-10-19 ENCOUNTER — Telehealth: Payer: Self-pay | Admitting: Family Medicine

## 2015-10-19 NOTE — Telephone Encounter (Signed)
Please schedule this patient for an appointment to discuss her symptoms and any medication-related questions

## 2015-10-19 NOTE — Telephone Encounter (Signed)
Pt wants to know if she can be put on something with steroids in it for her breathing? She also has questions about another medication. Pt requesting a call back.

## 2015-10-31 ENCOUNTER — Other Ambulatory Visit: Payer: Self-pay | Admitting: Family Medicine

## 2015-10-31 DIAGNOSIS — R0981 Nasal congestion: Secondary | ICD-10-CM

## 2015-11-12 ENCOUNTER — Telehealth: Payer: Self-pay | Admitting: Family Medicine

## 2015-11-12 NOTE — Telephone Encounter (Signed)
Have appointment scheduled for this coming up Monday. Patient is confused about her medication. Would like to know if she is suppose to start back on lexapro. Was told to come off it due to having jitters

## 2015-11-12 NOTE — Telephone Encounter (Signed)
Patient was started back on Lexapro 10 mg daily at her last office visit. She should continue taking the medication and will follow-up at her office visit appointment

## 2015-11-13 NOTE — Telephone Encounter (Signed)
Patient informed. 

## 2015-11-16 ENCOUNTER — Telehealth: Payer: Self-pay | Admitting: Family Medicine

## 2015-11-16 ENCOUNTER — Encounter: Payer: Self-pay | Admitting: Family Medicine

## 2015-11-16 ENCOUNTER — Ambulatory Visit (INDEPENDENT_AMBULATORY_CARE_PROVIDER_SITE_OTHER): Payer: Commercial Managed Care - HMO | Admitting: Family Medicine

## 2015-11-16 VITALS — BP 118/78 | HR 83 | Temp 97.6°F | Resp 17 | Ht 63.0 in | Wt 123.3 lb

## 2015-11-16 DIAGNOSIS — G8929 Other chronic pain: Secondary | ICD-10-CM | POA: Diagnosis not present

## 2015-11-16 DIAGNOSIS — G629 Polyneuropathy, unspecified: Secondary | ICD-10-CM | POA: Diagnosis not present

## 2015-11-16 DIAGNOSIS — J449 Chronic obstructive pulmonary disease, unspecified: Secondary | ICD-10-CM | POA: Diagnosis not present

## 2015-11-16 DIAGNOSIS — M5416 Radiculopathy, lumbar region: Secondary | ICD-10-CM

## 2015-11-16 DIAGNOSIS — J0101 Acute recurrent maxillary sinusitis: Secondary | ICD-10-CM | POA: Diagnosis not present

## 2015-11-16 DIAGNOSIS — F4001 Agoraphobia with panic disorder: Secondary | ICD-10-CM

## 2015-11-16 DIAGNOSIS — G608 Other hereditary and idiopathic neuropathies: Secondary | ICD-10-CM

## 2015-11-16 MED ORDER — PREDNISONE 10 MG (21) PO TBPK: 10.0000 mg | ORAL_TABLET | Freq: Every day | ORAL | 0 refills | Status: DC

## 2015-11-16 MED ORDER — ALPRAZOLAM 2 MG PO TABS: 2.0000 mg | ORAL_TABLET | Freq: Three times a day (TID) | ORAL | 0 refills | Status: DC | PRN

## 2015-11-16 MED ORDER — PREGABALIN 75 MG PO CAPS: 75.0000 mg | ORAL_CAPSULE | Freq: Three times a day (TID) | ORAL | 0 refills | Status: DC

## 2015-11-16 MED ORDER — ALBUTEROL SULFATE HFA 108 (90 BASE) MCG/ACT IN AERS: 2.0000 | INHALATION_SPRAY | Freq: Four times a day (QID) | RESPIRATORY_TRACT | 1 refills | Status: DC | PRN

## 2015-11-16 MED ORDER — CARISOPRODOL 350 MG PO TABS: 350.0000 mg | ORAL_TABLET | Freq: Three times a day (TID) | ORAL | 0 refills | Status: DC

## 2015-11-16 MED ORDER — HYDROCODONE-ACETAMINOPHEN 10-325 MG PO TABS: 1.0000 | ORAL_TABLET | Freq: Four times a day (QID) | ORAL | 0 refills | Status: DC | PRN

## 2015-11-16 NOTE — Telephone Encounter (Signed)
Patient verbally informed °

## 2015-11-16 NOTE — Progress Notes (Signed)
Name: Tamara Mcclure   MRN: [id number]    DOB: 1955/03/07   Date:11/16/2015       Progress Note  Subjective  Chief Complaint  Chief Complaint  Patient presents with  . Follow-up    1 mo  . Medication Refill    Anxiety  Presents for follow-up visit. The problem has been gradually worsening. Symptoms include depressed mood, excessive worry, irritability, nervous/anxious behavior, panic and shortness of breath. Patient reports no dizziness or suicidal ideas. The severity of symptoms is causing significant distress and severe. The quality of sleep is poor.   Her past medical history is significant for anxiety/panic attacks. Past treatments include benzodiazephines. The treatment provided moderate relief. Compliance with prior treatments has been good.  Back Pain  This is a chronic problem. The problem is unchanged. The pain is present in the lumbar spine. The pain does not radiate. The pain is at a severity of 7/10. The pain is severe. The pain is the same all the time. The symptoms are aggravated by bending and position. Associated symptoms include numbness (numbness, burning, and aching  in feet, peripheral neuropathy.) and tingling. Pertinent negatives include no bladder incontinence, bowel incontinence or fever. She has tried analgesics and muscle relaxant (Lyrica and Hydrocodone for low back pain and  tingling in feet.) for the symptoms.     Past Medical History:  Diagnosis Date  . Anxiety   . Chronic lower back pain   . COPD (chronic obstructive pulmonary disease) ([place])   . Depression   . Hypertension   . Panic disorder with agoraphobia and severe panic attacks   . Peripheral sensory neuropathy [facility])     Past Surgical History:  Procedure Laterality Date  . none      Family History  Problem Relation Age of Onset  . Cancer Mother     Lung  . Cancer Father     pancreatic  . Breast cancer Neg Hx   . Ovarian cancer Neg Hx   . Colon cancer Neg Hx   . Diabetes Neg Hx   .  Heart disease Neg Hx     Social History   Social History  . Marital status: Single    Spouse name: N/A  . Number of children: N/A  . Years of education: N/A   Occupational History  . Not on file.   Social History Main Topics  . Smoking status: Current Every Day Smoker    Packs/day: 1.00    Years: 30.00    Types: Cigarettes  . Smokeless tobacco: Never Used  . Alcohol use No  . Drug use: No  . Sexual activity: Not Currently   Other Topics Concern  . Not on file   Social History Narrative  . No narrative on file     Current Outpatient Prescriptions:  .  albuterol (PROVENTIL HFA;VENTOLIN HFA) 108 (90 Base) MCG/ACT inhaler, Inhale 2 puffs into the lungs every 6 (six) hours as needed for wheezing or shortness of breath., Disp: 1 Inhaler, Rfl: 1 .  alprazolam (XANAX) 2 MG tablet, Take 1 tablet (2 mg total) by mouth 3 (three) times daily as needed for anxiety., Disp: 90 tablet, Rfl: 0 .  amLODipine (NORVASC) 10 MG tablet, Take 1 tablet (10 mg total) by mouth daily., Disp: 90 tablet, Rfl: 1 .  carisoprodol (SOMA) 350 MG tablet, Take 1 tablet (350 mg total) by mouth 3 (three) times daily., Disp: 90 tablet, Rfl: 0 .  clindamycin (CLEOCIN) 300 MG capsule, Take 1  capsule (300 mg total) by mouth 3 (three) times daily., Disp: 21 capsule, Rfl: 0 .  COMBIVENT RESPIMAT 20-100 MCG/ACT AERS respimat, INHALE 1 PUFF INTO THE LUNGS EVERY 6 HOURS AS NEEDED FOR WHEEZING OR SHORTNESS OF BREATH, Disp: 4 g, Rfl: 2 .  escitalopram (LEXAPRO) 10 MG tablet, Take 1 tablet (10 mg total) by mouth daily., Disp: 90 tablet, Rfl: 0 .  fluticasone (FLONASE) 50 MCG/ACT nasal spray, USE 2 SPRAYS INTO BOTH NOSTRILS ONCE A DAY, Disp: 16 g, Rfl: 0 .  Fluticasone-Salmeterol (ADVAIR) 250-50 MCG/DOSE AEPB, Inhale 1 puff into the lungs every 12 (twelve) hours., Disp: 60 each, Rfl: 0 .  HYDROcodone-acetaminophen (NORCO) 10-325 MG tablet, Take 1 tablet by mouth every 6 (six) hours as needed., Disp: 120 tablet, Rfl: 0 .   ibuprofen (ADVIL,MOTRIN) 200 MG tablet, Take 200 mg by mouth every 6 (six) hours as needed for mild pain. , Disp: , Rfl:  .  pantoprazole (PROTONIX) 40 MG tablet, TAKE 1 TABLET BY MOUTH ONCE A DAY, Disp: 30 tablet, Rfl: 0 .  potassium chloride (K-DUR) 10 MEQ tablet, TAKE 1 TABLET BY MOUTH ONCE A DAY, Disp: 90 tablet, Rfl: 1 .  potassium chloride (K-DUR,KLOR-CON) 10 MEQ tablet, Take 1 tablet (10 mEq total) by mouth daily., Disp: 90 tablet, Rfl: 0 .  pregabalin (LYRICA) 75 MG capsule, Take 1 capsule (75 mg total) by mouth 3 (three) times daily., Disp: 90 capsule, Rfl: 0 .  QUEtiapine (SEROQUEL) 100 MG tablet, Take 1 tablet (100 mg total) by mouth at bedtime., Disp: 90 tablet, Rfl: 0 .  sertraline (ZOLOFT) 25 MG tablet, Take 1 tablet (25 mg total) by mouth at bedtime., Disp: 90 tablet, Rfl: 1  Allergies  Allergen Reactions  . Penicillins Rash    .penallergy     Review of Systems  Constitutional: Positive for irritability. Negative for chills and fever.  HENT: Positive for congestion and sinus pain.   Respiratory: Positive for cough and shortness of breath.   Gastrointestinal: Negative for bowel incontinence.  Genitourinary: Negative for bladder incontinence.  Musculoskeletal: Positive for back pain and joint pain.  Neurological: Positive for tingling and numbness (numbness, burning, and aching  in feet, peripheral neuropathy.). Negative for dizziness.  Psychiatric/Behavioral: Negative for suicidal ideas. The patient is nervous/anxious.     Objective  Vitals:   11/16/15 1116  BP: 118/78  Pulse: 83  Resp: 17  Temp: 97.6 F (36.4 C)  TempSrc: Oral  SpO2: 94%  Weight: 123 lb 4.8 oz (55.9 kg)  Height: 5\' 3"  (1.6 m)    Physical Exam  Constitutional: She is well-developed, well-nourished, and in no distress.  HENT:  Right Ear: Tympanic membrane normal. No swelling or tenderness. No decreased hearing is noted.  Left Ear: No swelling or tenderness.  Mouth/Throat: Posterior  oropharyngeal erythema present. No oropharyngeal exudate or posterior oropharyngeal edema.  Left ear canal non erythematous appears 'wet', TM not visualized  Cardiovascular: Normal rate, regular rhythm, S1 normal, S2 normal and normal heart sounds.   No murmur heard. Pulmonary/Chest: Effort normal. She has wheezes in the right middle field, the right lower field, the left middle field and the left lower field.  Musculoskeletal:       Lumbar back: She exhibits tenderness, pain and spasm.  Psychiatric: Memory and judgment normal. Her mood appears anxious. She has a flat affect.  Nursing note and vitals reviewed.    Assessment & Plan  1. Chronic radicular low back pain Pain responsive to opioid therapy, along with  muscle relaxant. Patient compliant with controlled substances agreement. Refills provided and follow-up in one month - carisoprodol (SOMA) 350 MG tablet; Take 1 tablet (350 mg total) by mouth 3 (three) times daily.  Dispense: 90 tablet; Refill: 0 - HYDROcodone-acetaminophen (NORCO) 10-325 MG tablet; Take 1 tablet by mouth every 6 (six) hours as needed.  Dispense: 120 tablet; Refill: 0  2. Panic disorder with agoraphobia and severe panic attacks Taking alprazolam 2 mg up to 3 times daily when needed for relief of anxiety. Patient compliant with controlled substances agreement and understands the dependence potential, side effects and drug interactions of benzodiazepines. Refills provided - alprazolam (XANAX) 2 MG tablet; Take 1 tablet (2 mg total) by mouth 3 (three) times daily as needed for anxiety.  Dispense: 90 tablet; Refill: 0  3. Peripheral sensory neuropathy (HCC)  - pregabalin (LYRICA) 75 MG capsule; Take 1 capsule (75 mg total) by mouth 3 (three) times daily.  Dispense: 90 capsule; Refill: 0  4. Chronic obstructive pulmonary disease, unspecified COPD type ([place]) Advised to use albuterol inhaler up to 4 times daily, would like to find out from the pharmacist if her insurance  will cover Breo inhaler for COPD. - albuterol (PROVENTIL HFA;VENTOLIN HFA) 108 (90 Base) MCG/ACT inhaler; Inhale 2 puffs into the lungs every 6 (six) hours as needed for wheezing or shortness of breath.  Dispense: 1 Inhaler; Refill: 1  5. Acute recurrent maxillary sinusitis Patient has finished taking clindamycin last month, still has persistent symptoms of sinusitis. We'll start on prednisone, recommended consult to ENT. - predniSONE (STERAPRED UNI-PAK 21 TAB) 10 MG (21) TBPK tablet; Take 1 tablet (10 mg total) by mouth daily. 60 50 40 30 20 10  then STOP  Dispense: 21 tablet; Refill: 0   Franchon Ketterman Asad A. [facility] 11/16/2015 11:29 AM

## 2015-11-16 NOTE — Telephone Encounter (Signed)
If it's covered, we can switch to Breo at her next appointment in one month. Until then, she can continue to take Advair

## 2015-11-16 NOTE — Telephone Encounter (Signed)
Patient states that you wanted her to contact her insurance to see if [facility] is covered under her insurance. Patient states that it is covered.

## 2015-11-19 ENCOUNTER — Other Ambulatory Visit: Payer: Self-pay | Admitting: Family Medicine

## 2015-11-19 DIAGNOSIS — G4700 Insomnia, unspecified: Secondary | ICD-10-CM

## 2015-11-26 ENCOUNTER — Ambulatory Visit (INDEPENDENT_AMBULATORY_CARE_PROVIDER_SITE_OTHER): Payer: Commercial Managed Care - HMO | Admitting: Family Medicine

## 2015-11-26 ENCOUNTER — Encounter: Payer: Self-pay | Admitting: Family Medicine

## 2015-11-26 VITALS — BP 102/62 | HR 77 | Temp 97.7°F | Ht 63.0 in | Wt 124.1 lb

## 2015-11-26 DIAGNOSIS — L819 Disorder of pigmentation, unspecified: Secondary | ICD-10-CM | POA: Diagnosis not present

## 2015-11-26 DIAGNOSIS — J0100 Acute maxillary sinusitis, unspecified: Secondary | ICD-10-CM

## 2015-11-26 DIAGNOSIS — J449 Chronic obstructive pulmonary disease, unspecified: Secondary | ICD-10-CM | POA: Diagnosis not present

## 2015-11-26 MED ORDER — FLUTICASONE FUROATE-VILANTEROL 100-25 MCG/INH IN AEPB: 1.0000 | INHALATION_SPRAY | Freq: Every day | RESPIRATORY_TRACT | 1 refills | Status: DC

## 2015-11-26 MED ORDER — DOXYCYCLINE HYCLATE 100 MG PO TABS: 100.0000 mg | ORAL_TABLET | Freq: Two times a day (BID) | ORAL | 0 refills | Status: DC

## 2015-11-26 NOTE — Progress Notes (Signed)
Name: Tamara Mcclure   MRN: [id number]    DOB: Mar 30, 1955   Date:11/26/2015       Progress Note  Subjective  Chief Complaint  Chief Complaint  Patient presents with  . Cough    has been for a couple of weeks with congestion  . Numbness    numbness in ring finger on left hand    Cough  This is a recurrent problem. The problem has been gradually worsening. The cough is productive of sputum. Associated symptoms include nasal congestion and wheezing. Pertinent negatives include no chills or fever. She has tried a beta-agonist inhaler and oral steroids (Did not take Prednisone prescribed at last visit because it made her jittery) for the symptoms. Her past medical history is significant for COPD.   Pt. Presents for evaluation of numbness in left ring finger, abrupt onset yesterday afternoon, the finger started turning blue at the distal end and felt numb. She was scared, called 911, she was evaluated and by the time EMS arrived, the finger color had already started returning back to normal. She did not want to go to the hospital and was advised to follow up with PCP. At this time, there is no pain or numbness in finger, color has returned to normal, and she has full ROM of left hand.   Past Medical History:  Diagnosis Date  . Anxiety   . Chronic lower back pain   . COPD (chronic obstructive pulmonary disease) ([place])   . Depression   . Hypertension   . Panic disorder with agoraphobia and severe panic attacks   . Peripheral sensory neuropathy [facility])     Past Surgical History:  Procedure Laterality Date  . none      Family History  Problem Relation Age of Onset  . Cancer Mother     Lung  . Cancer Father     pancreatic  . Breast cancer Neg Hx   . Ovarian cancer Neg Hx   . Colon cancer Neg Hx   . Diabetes Neg Hx   . Heart disease Neg Hx     Social History   Social History  . Marital status: Single    Spouse name: N/A  . Number of children: N/A  . Years of education: N/A    Occupational History  . Not on file.   Social History Main Topics  . Smoking status: Current Every Day Smoker    Packs/day: 1.00    Years: 30.00    Types: Cigarettes  . Smokeless tobacco: Never Used  . Alcohol use No  . Drug use: No  . Sexual activity: Not Currently   Other Topics Concern  . Not on file   Social History Narrative  . No narrative on file     Current Outpatient Prescriptions:  .  albuterol (PROVENTIL HFA;VENTOLIN HFA) 108 (90 Base) MCG/ACT inhaler, Inhale 2 puffs into the lungs every 6 (six) hours as needed for wheezing or shortness of breath., Disp: 1 Inhaler, Rfl: 1 .  alprazolam (XANAX) 2 MG tablet, Take 1 tablet (2 mg total) by mouth 3 (three) times daily as needed for anxiety., Disp: 90 tablet, Rfl: 0 .  amLODipine (NORVASC) 10 MG tablet, Take 1 tablet (10 mg total) by mouth daily., Disp: 90 tablet, Rfl: 1 .  carisoprodol (SOMA) 350 MG tablet, Take 1 tablet (350 mg total) by mouth 3 (three) times daily., Disp: 90 tablet, Rfl: 0 .  COMBIVENT RESPIMAT 20-100 MCG/ACT AERS respimat, INHALE 1 PUFF INTO THE  LUNGS EVERY 6 HOURS AS NEEDED FOR WHEEZING OR SHORTNESS OF BREATH, Disp: 4 g, Rfl: 2 .  escitalopram (LEXAPRO) 10 MG tablet, Take 1 tablet (10 mg total) by mouth daily., Disp: 90 tablet, Rfl: 0 .  fluticasone (FLONASE) 50 MCG/ACT nasal spray, USE 2 SPRAYS INTO BOTH NOSTRILS ONCE A DAY, Disp: 16 g, Rfl: 0 .  Fluticasone-Salmeterol (ADVAIR) 250-50 MCG/DOSE AEPB, Inhale 1 puff into the lungs every 12 (twelve) hours., Disp: 60 each, Rfl: 0 .  HYDROcodone-acetaminophen (NORCO) 10-325 MG tablet, Take 1 tablet by mouth every 6 (six) hours as needed., Disp: 120 tablet, Rfl: 0 .  ibuprofen (ADVIL,MOTRIN) 200 MG tablet, Take 200 mg by mouth every 6 (six) hours as needed for mild pain. , Disp: , Rfl:  .  pantoprazole (PROTONIX) 40 MG tablet, TAKE 1 TABLET BY MOUTH ONCE A DAY, Disp: 30 tablet, Rfl: 0 .  potassium chloride (K-DUR) 10 MEQ tablet, TAKE 1 TABLET BY MOUTH ONCE A  DAY, Disp: 90 tablet, Rfl: 1 .  potassium chloride (K-DUR,KLOR-CON) 10 MEQ tablet, Take 1 tablet (10 mEq total) by mouth daily., Disp: 90 tablet, Rfl: 0 .  pregabalin (LYRICA) 75 MG capsule, Take 1 capsule (75 mg total) by mouth 3 (three) times daily., Disp: 90 capsule, Rfl: 0 .  QUEtiapine (SEROQUEL) 100 MG tablet, TAKE 1 TABLET BY MOUTH DAILY AT BEDTIME, Disp: 90 tablet, Rfl: 1 .  predniSONE (STERAPRED UNI-PAK 21 TAB) 10 MG (21) TBPK tablet, Take 1 tablet (10 mg total) by mouth daily. 60 50 40 30 20 10  then STOP (Patient not taking: Reported on 11/26/2015), Disp: 21 tablet, Rfl: 0 .  sertraline (ZOLOFT) 25 MG tablet, Take 1 tablet (25 mg total) by mouth at bedtime. (Patient not taking: Reported on 11/26/2015), Disp: 90 tablet, Rfl: 1  Allergies  Allergen Reactions  . Penicillins Rash    .penallergy     Review of Systems  Constitutional: Negative for chills and fever.  Respiratory: Positive for cough and wheezing.   Please see history of present illness for a complete list of review of systems  Objective  Vitals:   11/26/15 1529  BP: 102/62  Pulse: 77  Temp: 97.7 F (36.5 C)  SpO2: 90%  Weight: 124 lb 1.6 oz (56.3 kg)  Height: 5\' 3"  (1.6 m)    Physical Exam  Constitutional: She is well-developed, well-nourished, and in no distress.  HENT:  Nose: Right sinus exhibits maxillary sinus tenderness. Left sinus exhibits maxillary sinus tenderness.  Cardiovascular: Normal rate, regular rhythm, S1 normal, S2 normal and normal heart sounds.   No murmur heard. Pulmonary/Chest: Effort normal and breath sounds normal. No respiratory distress. She has no wheezes. She has no rales.  Musculoskeletal:       Left hand: She exhibits no tenderness, no bony tenderness, normal capillary refill and no deformity. Normal sensation noted. Normal strength noted.  Skin: Skin is warm, dry and intact.  Nursing note and vitals reviewed.    Assessment & Plan  1. Discoloration of skin of finger Now  resolved, start workup for autoimmune diseases including lupus and Raynaud's, consider referral to rheumatology - Antinuclear Antib (ANA) - Sedimentation rate - Anti-Smith antibody - Anti-DNA antibody, double-stranded  2. Chronic obstructive pulmonary disease, unspecified COPD type ([place]) Changed to [facility] for management of COPD. Continue on rescue inhaler. - fluticasone furoate-vilanterol (BREO ELLIPTA) 100-25 MCG/INH AEPB; Inhale 1 puff into the lungs daily.  Dispense: 3 each; Refill: 1  3. Acute non-recurrent maxillary sinusitis Patient could not take  prednisone prescribed for sinusitis, will change to doxycycline for 7 days. - doxycycline (VIBRA-TABS) 100 MG tablet; Take 1 tablet (100 mg total) by mouth 2 (two) times daily.  Dispense: 14 tablet; Refill: 0   Dakari Stabler Asad A. [facility] 11/26/2015 3:36 PM

## 2015-11-27 LAB — ANA: Anti Nuclear Antibody(ANA): NEGATIVE

## 2015-11-27 LAB — SEDIMENTATION RATE: Sed Rate: 1 mm/hr (ref 0–30)

## 2015-11-27 LAB — ANTI-DNA ANTIBODY, DOUBLE-STRANDED: ds DNA Ab: 16 IU/mL — ABNORMAL HIGH

## 2015-11-27 LAB — ANTI-SMITH ANTIBODY: ENA SM Ab Ser-aCnc: <1

## 2015-12-02 ENCOUNTER — Other Ambulatory Visit: Payer: Self-pay | Admitting: Family Medicine

## 2015-12-02 ENCOUNTER — Other Ambulatory Visit: Payer: Self-pay | Admitting: Emergency Medicine

## 2015-12-02 ENCOUNTER — Telehealth: Payer: Self-pay | Admitting: Family Medicine

## 2015-12-02 DIAGNOSIS — R0981 Nasal congestion: Secondary | ICD-10-CM

## 2015-12-02 DIAGNOSIS — L819 Disorder of pigmentation, unspecified: Secondary | ICD-10-CM

## 2015-12-02 MED ORDER — FLUTICASONE PROPIONATE 50 MCG/ACT NA SUSP: NASAL | 2 refills | Status: DC

## 2015-12-02 NOTE — Telephone Encounter (Signed)
Pt would like a call back about the one test that came back positive.

## 2015-12-02 NOTE — Telephone Encounter (Signed)
Called patient back and explained that a positive anti-double-stranded DNA antibody test is one of the tests were ordered for the diagnosis of lupus, she has been referred to rheumatology to start further workup.

## 2015-12-08 ENCOUNTER — Telehealth: Payer: Self-pay | Admitting: Family Medicine

## 2015-12-08 NOTE — Telephone Encounter (Signed)
Pt is asking for a call about her last appt. Says that congestion is a lot worse and wants to know if you could call her something else in or what she needs to do.

## 2015-12-10 NOTE — Telephone Encounter (Signed)
Patient has scheduled appointment for 12/15/2015 to evaluate symptoms

## 2015-12-10 NOTE — Telephone Encounter (Signed)
Routed to Dr. Manuella Ghazi for patient advice Pt is asking for a call about her last appt. Says that congestion is a lot worse and wants to know if you could call her something else in or what she needs to do.

## 2015-12-10 NOTE — Telephone Encounter (Signed)
Patient should schedule an appointment for evaluation of her symptoms or seek attention at an urgent care as soon as possible.

## 2015-12-15 ENCOUNTER — Encounter: Payer: Self-pay | Admitting: Family Medicine

## 2015-12-15 ENCOUNTER — Ambulatory Visit (INDEPENDENT_AMBULATORY_CARE_PROVIDER_SITE_OTHER): Payer: Commercial Managed Care - HMO | Admitting: Family Medicine

## 2015-12-15 DIAGNOSIS — G629 Polyneuropathy, unspecified: Secondary | ICD-10-CM

## 2015-12-15 DIAGNOSIS — F4001 Agoraphobia with panic disorder: Secondary | ICD-10-CM | POA: Diagnosis not present

## 2015-12-15 DIAGNOSIS — M5416 Radiculopathy, lumbar region: Secondary | ICD-10-CM

## 2015-12-15 DIAGNOSIS — G8929 Other chronic pain: Secondary | ICD-10-CM

## 2015-12-15 DIAGNOSIS — G608 Other hereditary and idiopathic neuropathies: Secondary | ICD-10-CM

## 2015-12-15 MED ORDER — PREGABALIN 75 MG PO CAPS: 75.0000 mg | ORAL_CAPSULE | Freq: Three times a day (TID) | ORAL | 0 refills | Status: DC

## 2015-12-15 MED ORDER — CARISOPRODOL 350 MG PO TABS
350.0000 mg | ORAL_TABLET | Freq: Three times a day (TID) | ORAL | 0 refills | Status: DC
Start: 2015-12-15 — End: 2016-01-13

## 2015-12-15 MED ORDER — ALPRAZOLAM 2 MG PO TABS: 2.0000 mg | ORAL_TABLET | Freq: Three times a day (TID) | ORAL | 0 refills | Status: DC | PRN

## 2015-12-15 MED ORDER — HYDROCODONE-ACETAMINOPHEN 10-325 MG PO TABS: 1.0000 | ORAL_TABLET | Freq: Four times a day (QID) | ORAL | 0 refills | Status: DC | PRN

## 2015-12-15 NOTE — Progress Notes (Signed)
Name: Tamara Mcclure   MRN: [id number]    DOB: 1955-09-23   Date:12/15/2015       Progress Note  Subjective  Chief Complaint  Chief Complaint  Patient presents with  . Follow-up    1 mo  . Nasal Congestion    Anxiety  Presents for follow-up visit. The problem has been gradually worsening. Symptoms include depressed mood, excessive worry, irritability, nervous/anxious behavior, panic and shortness of breath. Patient reports no dizziness or suicidal ideas. The severity of symptoms is causing significant distress and severe. The quality of sleep is poor.   Her past medical history is significant for anxiety/panic attacks. Past treatments include benzodiazephines. The treatment provided moderate relief. Compliance with prior treatments has been good.  Back Pain  This is a chronic problem. The problem is unchanged. The pain is present in the lumbar spine. The pain does not radiate. The pain is at a severity of 7/10. The pain is severe. The pain is the same all the time. The symptoms are aggravated by bending and position. Associated symptoms include numbness (numbness, burning, and aching  in feet, peripheral neuropathy.) and tingling. Pertinent negatives include no bladder incontinence, bowel incontinence or fever. She has tried analgesics and muscle relaxant (Lyrica and Hydrocodone for low back pain and  tingling in feet.) for the symptoms.      Past Medical History:  Diagnosis Date  . Anxiety   . Chronic lower back pain   . COPD (chronic obstructive pulmonary disease) ([place])   . Depression   . Hypertension   . Panic disorder with agoraphobia and severe panic attacks   . Peripheral sensory neuropathy [facility])     Past Surgical History:  Procedure Laterality Date  . none      Family History  Problem Relation Age of Onset  . Cancer Mother     Lung  . Cancer Father     pancreatic  . Breast cancer Neg Hx   . Ovarian cancer Neg Hx   . Colon cancer Neg Hx   . Diabetes Neg Hx   .  Heart disease Neg Hx     Social History   Social History  . Marital status: Single    Spouse name: N/A  . Number of children: N/A  . Years of education: N/A   Occupational History  . Not on file.   Social History Main Topics  . Smoking status: Current Every Day Smoker    Packs/day: 1.00    Years: 30.00    Types: Cigarettes  . Smokeless tobacco: Never Used  . Alcohol use No  . Drug use: No  . Sexual activity: Not Currently   Other Topics Concern  . Not on file   Social History Narrative  . No narrative on file     Current Outpatient Prescriptions:  .  albuterol (PROVENTIL HFA;VENTOLIN HFA) 108 (90 Base) MCG/ACT inhaler, Inhale 2 puffs into the lungs every 6 (six) hours as needed for wheezing or shortness of breath., Disp: 1 Inhaler, Rfl: 1 .  alprazolam (XANAX) 2 MG tablet, Take 1 tablet (2 mg total) by mouth 3 (three) times daily as needed for anxiety., Disp: 90 tablet, Rfl: 0 .  amLODipine (NORVASC) 10 MG tablet, Take 1 tablet (10 mg total) by mouth daily., Disp: 90 tablet, Rfl: 1 .  carisoprodol (SOMA) 350 MG tablet, Take 1 tablet (350 mg total) by mouth 3 (three) times daily., Disp: 90 tablet, Rfl: 0 .  COMBIVENT RESPIMAT 20-100 MCG/ACT AERS respimat,  INHALE 1 PUFF INTO THE LUNGS EVERY 6 HOURS AS NEEDED FOR WHEEZING OR SHORTNESS OF BREATH, Disp: 4 g, Rfl: 2 .  escitalopram (LEXAPRO) 10 MG tablet, Take 1 tablet (10 mg total) by mouth daily., Disp: 90 tablet, Rfl: 0 .  fluticasone (FLONASE) 50 MCG/ACT nasal spray, USE 2 SPRAYS INTO BOTH NOSTRILS ONCE A DAY, Disp: 16 g, Rfl: 2 .  fluticasone furoate-vilanterol (BREO ELLIPTA) 100-25 MCG/INH AEPB, Inhale 1 puff into the lungs daily., Disp: 3 each, Rfl: 1 .  HYDROcodone-acetaminophen (NORCO) 10-325 MG tablet, Take 1 tablet by mouth every 6 (six) hours as needed., Disp: 120 tablet, Rfl: 0 .  ibuprofen (ADVIL,MOTRIN) 200 MG tablet, Take 200 mg by mouth every 6 (six) hours as needed for mild pain. , Disp: , Rfl:  .  pantoprazole  (PROTONIX) 40 MG tablet, TAKE 1 TABLET BY MOUTH ONCE A DAY, Disp: 30 tablet, Rfl: 0 .  potassium chloride (K-DUR) 10 MEQ tablet, TAKE 1 TABLET BY MOUTH ONCE A DAY, Disp: 90 tablet, Rfl: 1 .  potassium chloride (K-DUR,KLOR-CON) 10 MEQ tablet, Take 1 tablet (10 mEq total) by mouth daily., Disp: 90 tablet, Rfl: 0 .  pregabalin (LYRICA) 75 MG capsule, Take 1 capsule (75 mg total) by mouth 3 (three) times daily., Disp: 90 capsule, Rfl: 0 .  QUEtiapine (SEROQUEL) 100 MG tablet, TAKE 1 TABLET BY MOUTH DAILY AT BEDTIME, Disp: 90 tablet, Rfl: 1 .  sertraline (ZOLOFT) 25 MG tablet, Take 1 tablet (25 mg total) by mouth at bedtime., Disp: 90 tablet, Rfl: 1 .  doxycycline (VIBRA-TABS) 100 MG tablet, Take 1 tablet (100 mg total) by mouth 2 (two) times daily. (Patient not taking: Reported on 12/15/2015), Disp: 14 tablet, Rfl: 0  Allergies  Allergen Reactions  . Penicillins Rash    .penallergy     Review of Systems  Constitutional: Positive for irritability. Negative for fever.  Respiratory: Positive for shortness of breath.   Gastrointestinal: Negative for bowel incontinence.  Genitourinary: Negative for bladder incontinence.  Musculoskeletal: Positive for back pain.  Neurological: Positive for tingling and numbness (numbness, burning, and aching  in feet, peripheral neuropathy.). Negative for dizziness.  Psychiatric/Behavioral: Negative for suicidal ideas. The patient is nervous/anxious.      Objective  Vitals:   12/15/15 1127  BP: 106/73  Pulse: 85  Resp: 17  Temp: 97.5 F (36.4 C)  TempSrc: Oral  SpO2: 95%  Weight: 123 lb 14.4 oz (56.2 kg)  Height: 5\' 3"  (1.6 m)    Physical Exam  Constitutional: She is oriented to person, place, and time and well-developed, well-nourished, and in no distress.  HENT:  Head: Normocephalic and atraumatic.  Cardiovascular: Normal rate, regular rhythm and normal heart sounds.   No murmur heard. Pulmonary/Chest: Effort normal and breath sounds normal. She  has no wheezes.  Musculoskeletal:       Lumbar back: She exhibits tenderness, pain and spasm.       Back:  Neurological: She is alert and oriented to person, place, and time.  Psychiatric: Mood and memory normal. She has a flat affect.  Nursing note and vitals reviewed.     Assessment & Plan  1. Peripheral sensory neuropathy (HCC) Symptoms relieved with Lyrica taken up to 3 times daily, refills provided - pregabalin (LYRICA) 75 MG capsule; Take 1 capsule (75 mg total) by mouth 3 (three) times daily.  Dispense: 90 capsule; Refill: 0  2. Panic disorder with agoraphobia and severe panic attacks Severe anxiety and panic attacks responsive to alprazolam  taken up to 3 times daily when needed. Patient compliant with controlled substances agreement and understands the dependence potential, side effects and drug interactions of benzodiazepines. - alprazolam (XANAX) 2 MG tablet; Take 1 tablet (2 mg total) by mouth 3 (three) times daily as needed for anxiety.  Dispense: 90 tablet; Refill: 0  3. Chronic radicular low back pain Responsive to hydrocodone and Soma. Refills provided - HYDROcodone-acetaminophen (NORCO) 10-325 MG tablet; Take 1 tablet by mouth every 6 (six) hours as needed.  Dispense: 120 tablet; Refill: 0 - carisoprodol (SOMA) 350 MG tablet; Take 1 tablet (350 mg total) by mouth 3 (three) times daily.  Dispense: 90 tablet; Refill: 0   Kortny Lirette Asad A. [facility] 12/15/2015 11:31 AM

## 2015-12-21 DIAGNOSIS — Z720 Tobacco use: Secondary | ICD-10-CM | POA: Diagnosis not present

## 2015-12-21 DIAGNOSIS — I7300 Raynaud's syndrome without gangrene: Secondary | ICD-10-CM | POA: Diagnosis not present

## 2015-12-28 ENCOUNTER — Telehealth: Payer: Self-pay | Admitting: Family Medicine

## 2015-12-28 NOTE — Telephone Encounter (Signed)
Patient states that you had asked her if she would like to be up on arthritis medication and she declined. Patient calls today now requesting the medication and is asking that you please send to [facility].

## 2015-12-29 ENCOUNTER — Telehealth: Payer: Self-pay | Admitting: Family Medicine

## 2015-12-29 NOTE — Telephone Encounter (Signed)
Pt requesting referral to ENT due to her sinuses and her ears. Pt states that Dr Manuella Ghazi has checked it for her before

## 2015-12-29 NOTE — Telephone Encounter (Signed)
If Dr. Manuella Ghazi approves the referral then it will be submitted.  Please advise

## 2016-01-06 NOTE — Telephone Encounter (Signed)
Referral to ENT is approved, diagnosis: Recurrent sinusitis

## 2016-01-06 NOTE — Telephone Encounter (Signed)
We will have to discuss any change in medication at her regularly scheduled appointment.

## 2016-01-07 ENCOUNTER — Other Ambulatory Visit: Payer: Self-pay | Admitting: Family Medicine

## 2016-01-07 DIAGNOSIS — J0100 Acute maxillary sinusitis, unspecified: Secondary | ICD-10-CM

## 2016-01-07 NOTE — Telephone Encounter (Signed)
Patient verbally informed and states she will see you at her appointment 01/14/16

## 2016-01-08 ENCOUNTER — Telehealth: Payer: Self-pay | Admitting: Family Medicine

## 2016-01-08 NOTE — Telephone Encounter (Signed)
FYI. PT SAID THAT SHE SEEN DR BOCK  AT [facility]. AND THAT SHE PLACED HER ON ARTHRITIS MEDCIATION ( DICLOFENAC SODIUM 75MG  2 X A DAY). WANTS TO KNOW IF THIS IS OK WITH YOU . ALWAYS WANTS TO RUN IT BY YOU ALSO TO MAKE SURE IS OK.

## 2016-01-08 NOTE — Telephone Encounter (Signed)
Please inform patient that it is okay to take Diclofenac as prescribed by the doctor

## 2016-01-12 ENCOUNTER — Other Ambulatory Visit: Payer: Self-pay | Admitting: Family Medicine

## 2016-01-13 ENCOUNTER — Encounter: Payer: Self-pay | Admitting: Family Medicine

## 2016-01-13 ENCOUNTER — Ambulatory Visit (INDEPENDENT_AMBULATORY_CARE_PROVIDER_SITE_OTHER): Payer: Commercial Managed Care - HMO | Admitting: Family Medicine

## 2016-01-13 DIAGNOSIS — F99 Mental disorder, not otherwise specified: Secondary | ICD-10-CM | POA: Diagnosis not present

## 2016-01-13 DIAGNOSIS — E876 Hypokalemia: Secondary | ICD-10-CM | POA: Diagnosis not present

## 2016-01-13 DIAGNOSIS — G629 Polyneuropathy, unspecified: Secondary | ICD-10-CM

## 2016-01-13 DIAGNOSIS — G608 Other hereditary and idiopathic neuropathies: Secondary | ICD-10-CM

## 2016-01-13 DIAGNOSIS — M5416 Radiculopathy, lumbar region: Secondary | ICD-10-CM | POA: Diagnosis not present

## 2016-01-13 DIAGNOSIS — F5105 Insomnia due to other mental disorder: Secondary | ICD-10-CM | POA: Diagnosis not present

## 2016-01-13 DIAGNOSIS — G8929 Other chronic pain: Secondary | ICD-10-CM

## 2016-01-13 DIAGNOSIS — F4001 Agoraphobia with panic disorder: Secondary | ICD-10-CM

## 2016-01-13 MED ORDER — QUETIAPINE FUMARATE 100 MG PO TABS
100.0000 mg | ORAL_TABLET | Freq: Every day | ORAL | 1 refills | Status: DC
Start: 2016-01-13 — End: 2016-02-11

## 2016-01-13 MED ORDER — PREGABALIN 75 MG PO CAPS: 75.0000 mg | ORAL_CAPSULE | Freq: Three times a day (TID) | ORAL | 0 refills | Status: DC

## 2016-01-13 MED ORDER — POTASSIUM CHLORIDE CRYS ER 10 MEQ PO TBCR: 10.0000 meq | EXTENDED_RELEASE_TABLET | Freq: Every day | ORAL | 0 refills | Status: DC

## 2016-01-13 MED ORDER — ALPRAZOLAM 2 MG PO TABS: 2.0000 mg | ORAL_TABLET | Freq: Three times a day (TID) | ORAL | 0 refills | Status: DC | PRN

## 2016-01-13 MED ORDER — HYDROCODONE-ACETAMINOPHEN 10-325 MG PO TABS: 1.0000 | ORAL_TABLET | Freq: Four times a day (QID) | ORAL | 0 refills | Status: DC | PRN

## 2016-01-13 MED ORDER — CARISOPRODOL 350 MG PO TABS
350.0000 mg | ORAL_TABLET | Freq: Three times a day (TID) | ORAL | 0 refills | Status: DC
Start: 2016-01-13 — End: 2016-02-11

## 2016-01-13 NOTE — Progress Notes (Signed)
Name: Tamara Mcclure   MRN: [id number]    DOB: 09-29-1955   Date:01/13/2016       Progress Note  Subjective  Chief Complaint  Chief Complaint  Patient presents with  . Follow-up    1 mo  . Medication Refill    Anxiety  Presents for follow-up visit. The problem has been gradually worsening. Symptoms include depressed mood, excessive worry, insomnia, irritability, nervous/anxious behavior, panic and shortness of breath. Patient reports no dizziness or suicidal ideas. The severity of symptoms is causing significant distress and severe. The quality of sleep is poor.   Her past medical history is significant for anxiety/panic attacks. Past treatments include benzodiazephines. The treatment provided moderate relief. Compliance with prior treatments has been good.  Back Pain  This is a chronic problem. The problem is unchanged. The pain is present in the lumbar spine. The pain does not radiate. The pain is at a severity of 7/10. The pain is severe. The pain is the same all the time. The symptoms are aggravated by bending and position. Associated symptoms include numbness (numbness, burning, and aching  in feet, peripheral neuropathy.) and tingling. Pertinent negatives include no bladder incontinence, bowel incontinence or fever. She has tried analgesics and muscle relaxant (Lyrica and Hydrocodone for low back pain and  tingling in feet.) for the symptoms.  Insomnia  Primary symptoms: sleep disturbance.  The onset quality is gradual. The symptoms are aggravated by anxiety and pain. Past treatments include medication. The treatment provided moderate relief. Typical bedtime:  8-10 P.M..  PMH includes: family stress or anxiety, chronic pain.     Past Medical History:  Diagnosis Date  . Anxiety   . Chronic lower back pain   . COPD (chronic obstructive pulmonary disease) ([place])   . Depression   . Hypertension   . Panic disorder with agoraphobia and severe panic attacks   . Peripheral sensory  neuropathy [facility])     Past Surgical History:  Procedure Laterality Date  . none      Family History  Problem Relation Age of Onset  . Cancer Mother     Lung  . Cancer Father     pancreatic  . Breast cancer Neg Hx   . Ovarian cancer Neg Hx   . Colon cancer Neg Hx   . Diabetes Neg Hx   . Heart disease Neg Hx     Social History   Social History  . Marital status: Single    Spouse name: N/A  . Number of children: N/A  . Years of education: N/A   Occupational History  . Not on file.   Social History Main Topics  . Smoking status: Current Every Day Smoker    Packs/day: 1.00    Years: 30.00    Types: Cigarettes  . Smokeless tobacco: Never Used  . Alcohol use No  . Drug use: No  . Sexual activity: Not Currently   Other Topics Concern  . Not on file   Social History Narrative  . No narrative on file     Current Outpatient Prescriptions:  .  albuterol (PROVENTIL HFA;VENTOLIN HFA) 108 (90 Base) MCG/ACT inhaler, Inhale 2 puffs into the lungs every 6 (six) hours as needed for wheezing or shortness of breath., Disp: 1 Inhaler, Rfl: 1 .  alprazolam (XANAX) 2 MG tablet, Take 1 tablet (2 mg total) by mouth 3 (three) times daily as needed for anxiety., Disp: 90 tablet, Rfl: 0 .  amLODipine (NORVASC) 10 MG tablet, Take 1  tablet (10 mg total) by mouth daily., Disp: 90 tablet, Rfl: 1 .  carisoprodol (SOMA) 350 MG tablet, Take 1 tablet (350 mg total) by mouth 3 (three) times daily., Disp: 90 tablet, Rfl: 0 .  COMBIVENT RESPIMAT 20-100 MCG/ACT AERS respimat, INHALE 1 PUFF INTO THE LUNGS EVERY 6 HOURS AS NEEDED FOR WHEEZING OR SHORTNESS OF BREATH, Disp: 4 g, Rfl: 2 .  escitalopram (LEXAPRO) 10 MG tablet, Take 1 tablet (10 mg total) by mouth daily., Disp: 90 tablet, Rfl: 0 .  fluticasone (FLONASE) 50 MCG/ACT nasal spray, USE 2 SPRAYS INTO BOTH NOSTRILS ONCE A DAY, Disp: 16 g, Rfl: 2 .  fluticasone furoate-vilanterol (BREO ELLIPTA) 100-25 MCG/INH AEPB, Inhale 1 puff into the lungs  daily., Disp: 3 each, Rfl: 1 .  HYDROcodone-acetaminophen (NORCO) 10-325 MG tablet, Take 1 tablet by mouth every 6 (six) hours as needed., Disp: 120 tablet, Rfl: 0 .  ibuprofen (ADVIL,MOTRIN) 200 MG tablet, Take 200 mg by mouth every 6 (six) hours as needed for mild pain. , Disp: , Rfl:  .  pantoprazole (PROTONIX) 40 MG tablet, TAKE 1 TABLET BY MOUTH ONCE A DAY, Disp: 30 tablet, Rfl: 0 .  potassium chloride (K-DUR) 10 MEQ tablet, TAKE 1 TABLET BY MOUTH ONCE A DAY, Disp: 90 tablet, Rfl: 1 .  potassium chloride (K-DUR,KLOR-CON) 10 MEQ tablet, Take 1 tablet (10 mEq total) by mouth daily., Disp: 90 tablet, Rfl: 0 .  pregabalin (LYRICA) 75 MG capsule, Take 1 capsule (75 mg total) by mouth 3 (three) times daily., Disp: 90 capsule, Rfl: 0 .  QUEtiapine (SEROQUEL) 100 MG tablet, TAKE 1 TABLET BY MOUTH DAILY AT BEDTIME, Disp: 90 tablet, Rfl: 1 .  sertraline (ZOLOFT) 25 MG tablet, Take 1 tablet (25 mg total) by mouth at bedtime., Disp: 90 tablet, Rfl: 1  Allergies  Allergen Reactions  . Penicillins Rash and Other (See Comments)    .penallergy .penallergy     Review of Systems  Constitutional: Positive for irritability. Negative for fever.  Respiratory: Positive for shortness of breath.   Gastrointestinal: Negative for bowel incontinence.  Genitourinary: Negative for bladder incontinence.  Musculoskeletal: Positive for back pain.  Neurological: Positive for tingling and numbness (numbness, burning, and aching  in feet, peripheral neuropathy.). Negative for dizziness.  Psychiatric/Behavioral: Positive for sleep disturbance. Negative for suicidal ideas. The patient is nervous/anxious and has insomnia.      Objective  Vitals:   01/13/16 1116  BP: 108/68  Pulse: 96  Resp: 16  Temp: 97.6 F (36.4 C)  TempSrc: Oral  SpO2: 93%  Weight: 126 lb 3.2 oz (57.2 kg)  Height: 5\' 3"  (1.6 m)    Physical Exam  Constitutional: She is oriented to person, place, and time and well-developed,  well-nourished, and in no distress.  HENT:  Head: Normocephalic and atraumatic.  Cardiovascular: Normal rate, regular rhythm and normal heart sounds.   No murmur heard. Pulmonary/Chest: Effort normal and breath sounds normal. She has no wheezes.  Musculoskeletal:       Lumbar back: She exhibits tenderness, pain and spasm.       Back:  Neurological: She is alert and oriented to person, place, and time.  Psychiatric: Mood and memory normal. She has a flat affect.  Nursing note and vitals reviewed.     Assessment & Plan  1. Chronic radicular low back pain Stable and responsive to chronic opioid therapy, patient plan the controlled substances agreement and understands the dependence potential for side effects and drug interactions of opioids. -  HYDROcodone-acetaminophen (NORCO) 10-325 MG tablet; Take 1 tablet by mouth every 6 (six) hours as needed.  Dispense: 120 tablet; Refill: 0 - carisoprodol (SOMA) 350 MG tablet; Take 1 tablet (350 mg total) by mouth 3 (three) times daily.  Dispense: 90 tablet; Refill: 0  2. Panic disorder with agoraphobia and severe panic attacks  - alprazolam (XANAX) 2 MG tablet; Take 1 tablet (2 mg total) by mouth 3 (three) times daily as needed for anxiety.  Dispense: 90 tablet; Refill: 0  3. Peripheral sensory neuropathy (HCC)  - pregabalin (LYRICA) 75 MG capsule; Take 1 capsule (75 mg total) by mouth 3 (three) times daily.  Dispense: 90 capsule; Refill: 0  4. Insomnia due to other mental disorder  - QUEtiapine (SEROQUEL) 100 MG tablet; Take 1 tablet (100 mg total) by mouth at bedtime.  Dispense: 90 tablet; Refill: 1  5. Hypokalemia  - potassium chloride (K-DUR,KLOR-CON) 10 MEQ tablet; Take 1 tablet (10 mEq total) by mouth daily.  Dispense: 90 tablet; Refill: 0   Jolana Runkles [facility] 01/13/2016 11:32 AM

## 2016-01-14 ENCOUNTER — Ambulatory Visit: Payer: Commercial Managed Care - HMO | Admitting: Family Medicine

## 2016-01-14 NOTE — Telephone Encounter (Signed)
Medication was refilled on 01/13/2015 at office visit

## 2016-01-18 ENCOUNTER — Telehealth: Payer: Self-pay | Admitting: Family Medicine

## 2016-01-18 DIAGNOSIS — J069 Acute upper respiratory infection, unspecified: Secondary | ICD-10-CM

## 2016-01-18 NOTE — Telephone Encounter (Signed)
Please schedule patient for an appointment for evaluation of symptoms and appropriate therapy.

## 2016-01-18 NOTE — Telephone Encounter (Signed)
Pt would like a call back about her head and maybe to get something called in until she can get into ENT.

## 2016-01-19 DIAGNOSIS — J069 Acute upper respiratory infection, unspecified: Secondary | ICD-10-CM | POA: Insufficient documentation

## 2016-01-19 MED ORDER — BENZONATATE 200 MG PO CAPS: 200.0000 mg | ORAL_CAPSULE | Freq: Two times a day (BID) | ORAL | 0 refills | Status: DC | PRN

## 2016-01-19 MED ORDER — DOXYCYCLINE HYCLATE 100 MG PO TABS: 100.0000 mg | ORAL_TABLET | Freq: Two times a day (BID) | ORAL | 0 refills | Status: AC

## 2016-01-19 NOTE — Telephone Encounter (Signed)
Called pt to try and schedule an appt and pt states this concern was discussed at her last appt.

## 2016-01-19 NOTE — Telephone Encounter (Signed)
Patient has hoarseness, chest congestion and stuffiness in the head, would like to have something called in for the symptoms. In the past she has been successfully treated with antibiotics. She has also been referred to ENT for further follow-up and has an appointment scheduled within the current month. We will start on Tessalon Perles and doxycycline for treatment of above-mentioned symptoms.

## 2016-01-25 ENCOUNTER — Telehealth: Payer: Self-pay | Admitting: Family Medicine

## 2016-01-25 NOTE — Telephone Encounter (Signed)
Pt states she has been wheezing for 2 weeks and would like advise on what to do. Pt has prednisone at home and wants to know if she should take that. Please call her back.

## 2016-01-27 NOTE — Telephone Encounter (Signed)
Spoke with patient and she has a lot of congestion and stuffiness, she has finished Doxycycline and Tessalon and that has not helped. She is wondering if she may have Pneumonia. I have recommended her to take Mucinex and will see her on Friday, please schedule for last appointment of the day. Thanks.

## 2016-01-29 ENCOUNTER — Telehealth: Payer: Self-pay | Admitting: Family Medicine

## 2016-01-29 NOTE — Telephone Encounter (Signed)
Pt states due to the road conditions she can not come out to come to her appt and is requesting something be called into her pharmacy for her congestion.

## 2016-02-01 NOTE — Telephone Encounter (Signed)
She'll will need to be seen for evaluation of symptoms and to prescribe appropriate therapy.

## 2016-02-11 ENCOUNTER — Encounter: Payer: Self-pay | Admitting: Family Medicine

## 2016-02-11 ENCOUNTER — Ambulatory Visit (INDEPENDENT_AMBULATORY_CARE_PROVIDER_SITE_OTHER): Payer: Commercial Managed Care - HMO | Admitting: Family Medicine

## 2016-02-11 VITALS — BP 112/72 | HR 90 | Temp 97.5°F | Resp 16 | Ht 63.0 in | Wt 124.3 lb

## 2016-02-11 DIAGNOSIS — F4001 Agoraphobia with panic disorder: Secondary | ICD-10-CM

## 2016-02-11 DIAGNOSIS — G8929 Other chronic pain: Secondary | ICD-10-CM

## 2016-02-11 DIAGNOSIS — M5416 Radiculopathy, lumbar region: Secondary | ICD-10-CM

## 2016-02-11 DIAGNOSIS — F99 Mental disorder, not otherwise specified: Secondary | ICD-10-CM | POA: Diagnosis not present

## 2016-02-11 DIAGNOSIS — G608 Other hereditary and idiopathic neuropathies: Secondary | ICD-10-CM

## 2016-02-11 DIAGNOSIS — F5105 Insomnia due to other mental disorder: Secondary | ICD-10-CM | POA: Diagnosis not present

## 2016-02-11 DIAGNOSIS — G629 Polyneuropathy, unspecified: Secondary | ICD-10-CM

## 2016-02-11 MED ORDER — HYDROCODONE-ACETAMINOPHEN 10-325 MG PO TABS: 1.0000 | ORAL_TABLET | Freq: Four times a day (QID) | ORAL | 0 refills | Status: DC | PRN

## 2016-02-11 MED ORDER — CARISOPRODOL 350 MG PO TABS: 350.0000 mg | ORAL_TABLET | Freq: Three times a day (TID) | ORAL | 0 refills | Status: DC

## 2016-02-11 MED ORDER — VENLAFAXINE HCL ER 75 MG PO CP24: 75.0000 mg | ORAL_CAPSULE | Freq: Every day | ORAL | 0 refills | Status: DC

## 2016-02-11 MED ORDER — QUETIAPINE FUMARATE 100 MG PO TABS: 100.0000 mg | ORAL_TABLET | Freq: Every day | ORAL | 1 refills | Status: DC

## 2016-02-11 MED ORDER — ALPRAZOLAM 2 MG PO TABS: 2.0000 mg | ORAL_TABLET | Freq: Three times a day (TID) | ORAL | 0 refills | Status: DC | PRN

## 2016-02-11 MED ORDER — PREGABALIN 75 MG PO CAPS: 75.0000 mg | ORAL_CAPSULE | Freq: Three times a day (TID) | ORAL | 0 refills | Status: DC

## 2016-02-11 NOTE — Progress Notes (Signed)
Name: Tamara Mcclure   MRN: [id number]    DOB: 1955-12-29   Date:02/11/2016       Progress Note  Subjective  Chief Complaint  Chief Complaint  Patient presents with  . Back Pain    1 month follow up  . Insomnia  . Anxiety    Back Pain  This is a chronic problem. The problem is unchanged. The pain is present in the lumbar spine. The pain does not radiate. The pain is at a severity of 7/10. The pain is severe. The pain is the same all the time. The symptoms are aggravated by bending and position. Associated symptoms include numbness (numbness, burning, and aching  in feet, peripheral neuropathy.) and tingling. Pertinent negatives include no bladder incontinence, bowel incontinence or fever. She has tried analgesics and muscle relaxant (Lyrica and Hydrocodone for low back pain and  tingling in feet.) for the symptoms.  Insomnia  Primary symptoms: sleep disturbance.  The onset quality is gradual. The symptoms are aggravated by anxiety and pain. Past treatments include medication. The treatment provided moderate relief. Typical bedtime:  8-10 P.M..  PMH includes: family stress or anxiety, chronic pain.  Anxiety  Presents for follow-up visit. Symptoms include depressed mood, excessive worry, insomnia, irritability, nervous/anxious behavior, panic and shortness of breath. Patient reports no dizziness or suicidal ideas. The severity of symptoms is causing significant distress and severe. The quality of sleep is poor.      Past Medical History:  Diagnosis Date  . Anxiety   . Chronic lower back pain   . COPD (chronic obstructive pulmonary disease) ([place])   . Depression   . Hypertension   . Panic disorder with agoraphobia and severe panic attacks   . Peripheral sensory neuropathy [facility])     Past Surgical History:  Procedure Laterality Date  . none      Family History  Problem Relation Age of Onset  . Cancer Mother     Lung  . Cancer Father     pancreatic  . Breast cancer Neg Hx   .  Ovarian cancer Neg Hx   . Colon cancer Neg Hx   . Diabetes Neg Hx   . Heart disease Neg Hx     Social History   Social History  . Marital status: Single    Spouse name: N/A  . Number of children: N/A  . Years of education: N/A   Occupational History  . Not on file.   Social History Main Topics  . Smoking status: Current Every Day Smoker    Packs/day: 1.00    Years: 30.00    Types: Cigarettes  . Smokeless tobacco: Never Used  . Alcohol use No  . Drug use: No  . Sexual activity: Not Currently   Other Topics Concern  . Not on file   Social History Narrative  . No narrative on file     Current Outpatient Prescriptions:  .  albuterol (PROVENTIL HFA;VENTOLIN HFA) 108 (90 Base) MCG/ACT inhaler, Inhale 2 puffs into the lungs every 6 (six) hours as needed for wheezing or shortness of breath., Disp: 1 Inhaler, Rfl: 1 .  alprazolam (XANAX) 2 MG tablet, Take 1 tablet (2 mg total) by mouth 3 (three) times daily as needed for anxiety., Disp: 90 tablet, Rfl: 0 .  amLODipine (NORVASC) 10 MG tablet, Take 1 tablet (10 mg total) by mouth daily., Disp: 90 tablet, Rfl: 1 .  benzonatate (TESSALON) 200 MG capsule, Take 1 capsule (200 mg total) by mouth  2 (two) times daily as needed for cough., Disp: 20 capsule, Rfl: 0 .  carisoprodol (SOMA) 350 MG tablet, Take 1 tablet (350 mg total) by mouth 3 (three) times daily., Disp: 90 tablet, Rfl: 0 .  COMBIVENT RESPIMAT 20-100 MCG/ACT AERS respimat, INHALE 1 PUFF INTO THE LUNGS EVERY 6 HOURS AS NEEDED FOR WHEEZING OR SHORTNESS OF BREATH, Disp: 4 g, Rfl: 2 .  escitalopram (LEXAPRO) 10 MG tablet, Take 1 tablet (10 mg total) by mouth daily., Disp: 90 tablet, Rfl: 0 .  fluticasone (FLONASE) 50 MCG/ACT nasal spray, USE 2 SPRAYS INTO BOTH NOSTRILS ONCE A DAY, Disp: 16 g, Rfl: 2 .  fluticasone furoate-vilanterol (BREO ELLIPTA) 100-25 MCG/INH AEPB, Inhale 1 puff into the lungs daily., Disp: 3 each, Rfl: 1 .  HYDROcodone-acetaminophen (NORCO) 10-325 MG tablet,  Take 1 tablet by mouth every 6 (six) hours as needed., Disp: 120 tablet, Rfl: 0 .  ibuprofen (ADVIL,MOTRIN) 200 MG tablet, Take 200 mg by mouth every 6 (six) hours as needed for mild pain. , Disp: , Rfl:  .  pantoprazole (PROTONIX) 40 MG tablet, TAKE 1 TABLET BY MOUTH ONCE A DAY, Disp: 30 tablet, Rfl: 0 .  potassium chloride (K-DUR) 10 MEQ tablet, TAKE 1 TABLET BY MOUTH ONCE A DAY, Disp: 90 tablet, Rfl: 1 .  potassium chloride (K-DUR,KLOR-CON) 10 MEQ tablet, Take 1 tablet (10 mEq total) by mouth daily., Disp: 90 tablet, Rfl: 0 .  pregabalin (LYRICA) 75 MG capsule, Take 1 capsule (75 mg total) by mouth 3 (three) times daily., Disp: 90 capsule, Rfl: 0 .  QUEtiapine (SEROQUEL) 100 MG tablet, Take 1 tablet (100 mg total) by mouth at bedtime., Disp: 90 tablet, Rfl: 1  Allergies  Allergen Reactions  . Penicillins Rash and Other (See Comments)    .penallergy .penallergy     Review of Systems  Constitutional: Positive for irritability. Negative for fever.  Respiratory: Positive for shortness of breath.   Gastrointestinal: Negative for bowel incontinence.  Genitourinary: Negative for bladder incontinence.  Musculoskeletal: Positive for back pain.  Neurological: Positive for tingling and numbness (numbness, burning, and aching  in feet, peripheral neuropathy.). Negative for dizziness.  Psychiatric/Behavioral: Positive for sleep disturbance. Negative for suicidal ideas. The patient is nervous/anxious and has insomnia.       Objective  Vitals:   02/11/16 1133  BP: 112/72  Pulse: 90  Resp: 16  Temp: 97.5 F (36.4 C)  SpO2: 96%  Weight: 124 lb 5 oz (56.4 kg)  Height: 5\' 3"  (1.6 m)    Physical Exam  Constitutional: She is oriented to person, place, and time and well-developed, well-nourished, and in no distress.  HENT:  Head: Normocephalic and atraumatic.  Cardiovascular: Normal rate, regular rhythm and normal heart sounds.   No murmur heard. Pulmonary/Chest: Effort normal and breath  sounds normal. She has no wheezes.  Musculoskeletal:       Lumbar back: She exhibits tenderness, pain and spasm.       Back:  Neurological: She is alert and oriented to person, place, and time.  Psychiatric: Mood and memory normal. She has a flat affect.  Nursing note and vitals reviewed.    Assessment & Plan  1. Chronic radicular low back pain Stable, responsive to hydrocodone and Soma as prescribed. Patient compliant with controlled substances agreement. - carisoprodol (SOMA) 350 MG tablet; Take 1 tablet (350 mg total) by mouth 3 (three) times daily.  Dispense: 90 tablet; Refill: 0 - HYDROcodone-acetaminophen (NORCO) 10-325 MG tablet; Take 1 tablet by mouth  every 6 (six) hours as needed.  Dispense: 120 tablet; Refill: 0  2. Panic disorder with agoraphobia and severe panic attacks We will DC Lexapro and start on venlafaxine for treatment of concurrent depression with anxiety and panic attacks. Continue on alprazolam as prescribed. Patient compliant with controlled substances agreement. - alprazolam (XANAX) 2 MG tablet; Take 1 tablet (2 mg total) by mouth 3 (three) times daily as needed for anxiety.  Dispense: 90 tablet; Refill: 0 - venlafaxine XR (EFFEXOR-XR) 75 MG 24 hr capsule; Take 1 capsule (75 mg total) by mouth daily with breakfast.  Dispense: 90 capsule; Refill: 0  3. Insomnia due to other mental disorder  - QUEtiapine (SEROQUEL) 100 MG tablet; Take 1 tablet (100 mg total) by mouth at bedtime.  Dispense: 90 tablet; Refill: 1  4. Peripheral sensory neuropathy (HCC)  - pregabalin (LYRICA) 75 MG capsule; Take 1 capsule (75 mg total) by mouth 3 (three) times daily.  Dispense: 90 capsule; Refill: 0     Kristilyn Coltrane [facility] 02/11/2016 12:21 PM

## 2016-02-12 ENCOUNTER — Telehealth: Payer: Self-pay | Admitting: Family Medicine

## 2016-02-12 ENCOUNTER — Ambulatory Visit: Payer: Commercial Managed Care - HMO | Admitting: Family Medicine

## 2016-02-12 NOTE — Telephone Encounter (Signed)
Pt would like a call back about possible side effects.

## 2016-02-15 ENCOUNTER — Other Ambulatory Visit: Payer: Self-pay | Admitting: Family Medicine

## 2016-02-15 DIAGNOSIS — R0981 Nasal congestion: Secondary | ICD-10-CM

## 2016-02-16 NOTE — Telephone Encounter (Signed)
With patient and she has started taking venlafaxine, reports that the medication made her jittery and nervous, she took it for 2 days and then stopped. She is wondering if there is a lower dose of venlafaxine. I have explained that for anxiety, depression, and panic disorder, venlafaxine's recommended dosage is 75 mg. I have advised her to restart taking it and if her symptoms return, we will discuss referral to psychiatry. Patient verbalized agreement

## 2016-03-09 ENCOUNTER — Ambulatory Visit (INDEPENDENT_AMBULATORY_CARE_PROVIDER_SITE_OTHER): Payer: Medicare HMO | Admitting: Family Medicine

## 2016-03-09 ENCOUNTER — Ambulatory Visit: Payer: Commercial Managed Care - HMO | Admitting: Family Medicine

## 2016-03-09 ENCOUNTER — Encounter: Payer: Self-pay | Admitting: Family Medicine

## 2016-03-09 DIAGNOSIS — G8929 Other chronic pain: Secondary | ICD-10-CM

## 2016-03-09 DIAGNOSIS — F4001 Agoraphobia with panic disorder: Secondary | ICD-10-CM | POA: Diagnosis not present

## 2016-03-09 DIAGNOSIS — G629 Polyneuropathy, unspecified: Secondary | ICD-10-CM | POA: Diagnosis not present

## 2016-03-09 DIAGNOSIS — M5416 Radiculopathy, lumbar region: Secondary | ICD-10-CM

## 2016-03-09 DIAGNOSIS — G608 Other hereditary and idiopathic neuropathies: Secondary | ICD-10-CM

## 2016-03-09 MED ORDER — ALPRAZOLAM 2 MG PO TABS: 2.0000 mg | ORAL_TABLET | Freq: Three times a day (TID) | ORAL | 0 refills | Status: DC | PRN

## 2016-03-09 MED ORDER — HYDROCODONE-ACETAMINOPHEN 10-325 MG PO TABS: 1.0000 | ORAL_TABLET | Freq: Four times a day (QID) | ORAL | 0 refills | Status: DC | PRN

## 2016-03-09 MED ORDER — PREGABALIN 75 MG PO CAPS: 75.0000 mg | ORAL_CAPSULE | Freq: Three times a day (TID) | ORAL | 0 refills | Status: DC

## 2016-03-09 MED ORDER — CARISOPRODOL 350 MG PO TABS: 350.0000 mg | ORAL_TABLET | Freq: Three times a day (TID) | ORAL | 0 refills | Status: DC

## 2016-03-09 NOTE — Progress Notes (Signed)
Name: Tamara Mcclure   MRN: [id number]    DOB: [birth year]   Date:03/09/2016       Progress Note  Subjective  Chief Complaint  Chief Complaint  Patient presents with  . Follow-up    1 mo  . Medication Refill    Anxiety  Presents for follow-up visit. The problem has been gradually worsening. Symptoms include depressed mood, excessive worry, insomnia, irritability, nervous/anxious behavior, panic and shortness of breath. Patient reports no dizziness or suicidal ideas. The severity of symptoms is causing significant distress and severe (anxiety increased by taking care of her grandson). The quality of sleep is poor.   Her past medical history is significant for anxiety/panic attacks. Past treatments include benzodiazephines. The treatment provided moderate relief. Compliance with prior treatments has been good.  Back Pain  This is a chronic problem. The problem is unchanged. The pain is present in the lumbar spine. The pain does not radiate. The pain is at a severity of 7/10. The pain is moderate. The pain is the same all the time. The symptoms are aggravated by bending and position. Associated symptoms include numbness (numbness, burning, and aching  in feet, peripheral neuropathy.) and tingling. Pertinent negatives include no bladder incontinence, bowel incontinence or fever. She has tried analgesics and muscle relaxant (Lyrica and Hydrocodone for low back pain and  tingling in feet.) for the symptoms.     Past Medical History:  Diagnosis Date  . Anxiety   . Chronic lower back pain   . COPD (chronic obstructive pulmonary disease) ([place])   . Depression   . Hypertension   . Panic disorder with agoraphobia and severe panic attacks   . Peripheral sensory neuropathy [facility])     Past Surgical History:  Procedure Laterality Date  . none      Family History  Problem Relation Age of Onset  . Cancer Mother     Lung  . Cancer Father     pancreatic  . Breast cancer Neg Hx   . Ovarian  cancer Neg Hx   . Colon cancer Neg Hx   . Diabetes Neg Hx   . Heart disease Neg Hx     Social History   Social History  . Marital status: Single    Spouse name: N/A  . Number of children: N/A  . Years of education: N/A   Occupational History  . Not on file.   Social History Main Topics  . Smoking status: Current Every Day Smoker    Packs/day: 1.00    Years: 30.00    Types: Cigarettes  . Smokeless tobacco: Never Used  . Alcohol use No  . Drug use: No  . Sexual activity: Not Currently   Other Topics Concern  . Not on file   Social History Narrative  . No narrative on file     Current Outpatient Prescriptions:  .  albuterol (PROVENTIL HFA;VENTOLIN HFA) 108 (90 Base) MCG/ACT inhaler, Inhale 2 puffs into the lungs every 6 (six) hours as needed for wheezing or shortness of breath., Disp: 1 Inhaler, Rfl: 1 .  alprazolam (XANAX) 2 MG tablet, Take 1 tablet (2 mg total) by mouth 3 (three) times daily as needed for anxiety., Disp: 90 tablet, Rfl: 0 .  amLODipine (NORVASC) 10 MG tablet, Take 1 tablet (10 mg total) by mouth daily., Disp: 90 tablet, Rfl: 1 .  benzonatate (TESSALON) 200 MG capsule, Take 1 capsule (200 mg total) by mouth 2 (two) times daily as needed for cough., Disp:  20 capsule, Rfl: 0 .  carisoprodol (SOMA) 350 MG tablet, Take 1 tablet (350 mg total) by mouth 3 (three) times daily., Disp: 90 tablet, Rfl: 0 .  COMBIVENT RESPIMAT 20-100 MCG/ACT AERS respimat, INHALE 1 PUFF INTO THE LUNGS EVERY 6 HOURS AS NEEDED FOR WHEEZING OR SHORTNESS OF BREATH, Disp: 4 g, Rfl: 2 .  fluticasone (FLONASE) 50 MCG/ACT nasal spray, USE 2 SPRAYS IN EACH NOSTRIL ONCE DAILY, Disp: 16 g, Rfl: 2 .  fluticasone furoate-vilanterol (BREO ELLIPTA) 100-25 MCG/INH AEPB, Inhale 1 puff into the lungs daily., Disp: 3 each, Rfl: 1 .  HYDROcodone-acetaminophen (NORCO) 10-325 MG tablet, Take 1 tablet by mouth every 6 (six) hours as needed., Disp: 120 tablet, Rfl: 0 .  ibuprofen (ADVIL,MOTRIN) 200 MG  tablet, Take 200 mg by mouth every 6 (six) hours as needed for mild pain. , Disp: , Rfl:  .  pantoprazole (PROTONIX) 40 MG tablet, TAKE 1 TABLET BY MOUTH ONCE A DAY, Disp: 30 tablet, Rfl: 0 .  potassium chloride (K-DUR) 10 MEQ tablet, TAKE 1 TABLET BY MOUTH ONCE A DAY, Disp: 90 tablet, Rfl: 1 .  potassium chloride (K-DUR,KLOR-CON) 10 MEQ tablet, Take 1 tablet (10 mEq total) by mouth daily., Disp: 90 tablet, Rfl: 0 .  pregabalin (LYRICA) 75 MG capsule, Take 1 capsule (75 mg total) by mouth 3 (three) times daily., Disp: 90 capsule, Rfl: 0 .  QUEtiapine (SEROQUEL) 100 MG tablet, Take 1 tablet (100 mg total) by mouth at bedtime., Disp: 90 tablet, Rfl: 1 .  venlafaxine XR (EFFEXOR-XR) 75 MG 24 hr capsule, Take 1 capsule (75 mg total) by mouth daily with breakfast., Disp: 90 capsule, Rfl: 0  Allergies  Allergen Reactions  . Penicillins Rash and Other (See Comments)    .penallergy .penallergy     Review of Systems  Constitutional: Positive for irritability. Negative for fever.  Respiratory: Positive for shortness of breath.   Gastrointestinal: Negative for bowel incontinence.  Genitourinary: Negative for bladder incontinence.  Musculoskeletal: Positive for back pain.  Neurological: Positive for tingling and numbness (numbness, burning, and aching  in feet, peripheral neuropathy.). Negative for dizziness.  Psychiatric/Behavioral: Negative for suicidal ideas. The patient is nervous/anxious and has insomnia.     Objective  Vitals:   03/09/16 1116  BP: 120/75  Pulse: 99  Resp: 17  Temp: 97.7 F (36.5 C)  TempSrc: Oral  SpO2: 96%  Weight: 127 lb 9.6 oz (57.9 kg)  Height: 5\' 3"  (1.6 m)    Physical Exam  Constitutional: She is oriented to person, place, and time and well-developed, well-nourished, and in no distress.  Pulmonary/Chest: Effort normal and breath sounds normal. She has no wheezes.  Musculoskeletal:       Lumbar back: She exhibits tenderness, pain and spasm.        Back:  Neurological: She is alert and oriented to person, place, and time.  Psychiatric: Mood, memory, affect and judgment normal.  Nursing note and vitals reviewed.     Assessment & Plan  1. Chronic radicular low back pain Stable and responsive to Hydrocodone and Soma for associated muscle spasm. - carisoprodol (SOMA) 350 MG tablet; Take 1 tablet (350 mg total) by mouth 3 (three) times daily.  Dispense: 90 tablet; Refill: 0 - HYDROcodone-acetaminophen (NORCO) 10-325 MG tablet; Take 1 tablet by mouth every 6 (six) hours as needed.  Dispense: 120 tablet; Refill: 0  2. Panic disorder with agoraphobia and severe panic attacks Responsive to Alprazolam as prescribed, pt. compliant with controlled substances agreement. -  alprazolam (XANAX) 2 MG tablet; Take 1 tablet (2 mg total) by mouth 3 (three) times daily as needed for anxiety.  Dispense: 90 tablet; Refill: 0  3. Peripheral sensory neuropathy (HCC)  - pregabalin (LYRICA) 75 MG capsule; Take 1 capsule (75 mg total) by mouth 3 (three) times daily.  Dispense: 90 capsule; Refill: 0   Aralyn Nowak [facility] 03/09/2016 11:28 AM

## 2016-03-10 ENCOUNTER — Ambulatory Visit: Payer: Commercial Managed Care - HMO | Admitting: Family Medicine

## 2016-04-06 ENCOUNTER — Other Ambulatory Visit: Payer: Self-pay | Admitting: Family Medicine

## 2016-04-06 DIAGNOSIS — I10 Essential (primary) hypertension: Secondary | ICD-10-CM

## 2016-04-07 ENCOUNTER — Ambulatory Visit (INDEPENDENT_AMBULATORY_CARE_PROVIDER_SITE_OTHER): Payer: Medicare HMO | Admitting: Family Medicine

## 2016-04-07 ENCOUNTER — Encounter: Payer: Self-pay | Admitting: Family Medicine

## 2016-04-07 VITALS — BP 112/68 | HR 88 | Temp 98.5°F | Resp 16 | Ht 68.0 in | Wt 129.8 lb

## 2016-04-07 DIAGNOSIS — G8929 Other chronic pain: Secondary | ICD-10-CM | POA: Diagnosis not present

## 2016-04-07 DIAGNOSIS — E876 Hypokalemia: Secondary | ICD-10-CM

## 2016-04-07 DIAGNOSIS — F4001 Agoraphobia with panic disorder: Secondary | ICD-10-CM | POA: Diagnosis not present

## 2016-04-07 DIAGNOSIS — J0141 Acute recurrent pansinusitis: Secondary | ICD-10-CM | POA: Diagnosis not present

## 2016-04-07 DIAGNOSIS — M25649 Stiffness of unspecified hand, not elsewhere classified: Secondary | ICD-10-CM | POA: Insufficient documentation

## 2016-04-07 DIAGNOSIS — G629 Polyneuropathy, unspecified: Secondary | ICD-10-CM | POA: Diagnosis not present

## 2016-04-07 DIAGNOSIS — M25549 Pain in joints of unspecified hand: Secondary | ICD-10-CM

## 2016-04-07 DIAGNOSIS — G608 Other hereditary and idiopathic neuropathies: Secondary | ICD-10-CM

## 2016-04-07 DIAGNOSIS — I10 Essential (primary) hypertension: Secondary | ICD-10-CM | POA: Diagnosis not present

## 2016-04-07 DIAGNOSIS — M5416 Radiculopathy, lumbar region: Secondary | ICD-10-CM

## 2016-04-07 MED ORDER — POTASSIUM CHLORIDE CRYS ER 10 MEQ PO TBCR: 10.0000 meq | EXTENDED_RELEASE_TABLET | Freq: Every day | ORAL | 0 refills | Status: DC

## 2016-04-07 MED ORDER — PREGABALIN 75 MG PO CAPS: 75.0000 mg | ORAL_CAPSULE | Freq: Three times a day (TID) | ORAL | 0 refills | Status: DC

## 2016-04-07 MED ORDER — HYDROCODONE-ACETAMINOPHEN 10-325 MG PO TABS: 1.0000 | ORAL_TABLET | Freq: Four times a day (QID) | ORAL | 0 refills | Status: DC | PRN

## 2016-04-07 MED ORDER — ALPRAZOLAM 2 MG PO TABS: 2.0000 mg | ORAL_TABLET | Freq: Three times a day (TID) | ORAL | 0 refills | Status: DC | PRN

## 2016-04-07 MED ORDER — AMLODIPINE BESYLATE 10 MG PO TABS: 10.0000 mg | ORAL_TABLET | Freq: Every day | ORAL | 1 refills | Status: DC

## 2016-04-07 MED ORDER — MELOXICAM 15 MG PO TABS: 15.0000 mg | ORAL_TABLET | Freq: Every day | ORAL | 0 refills | Status: DC

## 2016-04-07 MED ORDER — MOMETASONE FUROATE 50 MCG/ACT NA SUSP: 2.0000 | Freq: Every day | NASAL | 1 refills | Status: DC

## 2016-04-07 MED ORDER — CARISOPRODOL 350 MG PO TABS: 350.0000 mg | ORAL_TABLET | Freq: Three times a day (TID) | ORAL | 0 refills | Status: DC

## 2016-04-07 NOTE — Progress Notes (Signed)
Name: Tamara Mcclure   MRN: [id number]    DOB: 03-27-1955   Date:04/07/2016       Progress Note  Subjective  Chief Complaint  Chief Complaint  Patient presents with  . Follow-up    1 mo  . Medication Refill    Back Pain  This is a chronic problem. The problem is unchanged. The pain is present in the lumbar spine. The pain does not radiate. The pain is at a severity of 7/10. The pain is severe. The pain is the same all the time. The symptoms are aggravated by bending and position. Associated symptoms include numbness (numbness, burning, and aching  in feet, peripheral neuropathy.) and tingling. Pertinent negatives include no bladder incontinence, bowel incontinence or fever. She has tried analgesics and muscle relaxant (Lyrica and Hydrocodone for low back pain and  tingling in feet.) for the symptoms.  Insomnia  Primary symptoms: sleep disturbance, premature morning awakening.  The onset quality is gradual. The symptoms are aggravated by anxiety and pain. Past treatments include medication. The treatment provided moderate relief. Typical bedtime:  8-10 P.M..  PMH includes: family stress or anxiety, chronic pain.  Anxiety  Presents for follow-up visit. Symptoms include depressed mood, excessive worry, insomnia, irritability, nervous/anxious behavior, panic and shortness of breath. Patient reports no dizziness or suicidal ideas. The severity of symptoms is causing significant distress and severe. The quality of sleep is poor.    patient is also concerned about stiffness in hands and feet, most noticeable in the joints of the fingers, this makes it hard for her to grasp objects, she takes ibuprofen occasionally, asking if there is a different medication for treatment.  Past Medical History:  Diagnosis Date  . Anxiety   . Chronic lower back pain   . COPD (chronic obstructive pulmonary disease) ([place])   . Depression   . Hypertension   . Panic disorder with agoraphobia and severe panic attacks    . Peripheral sensory neuropathy [facility])     Past Surgical History:  Procedure Laterality Date  . none      Family History  Problem Relation Age of Onset  . Cancer Mother     Lung  . Cancer Father     pancreatic  . Breast cancer Neg Hx   . Ovarian cancer Neg Hx   . Colon cancer Neg Hx   . Diabetes Neg Hx   . Heart disease Neg Hx     Social History   Social History  . Marital status: Single    Spouse name: N/A  . Number of children: N/A  . Years of education: N/A   Occupational History  . Not on file.   Social History Main Topics  . Smoking status: Current Every Day Smoker    Packs/day: 1.00    Years: 30.00    Types: Cigarettes  . Smokeless tobacco: Never Used  . Alcohol use No  . Drug use: No  . Sexual activity: Not Currently   Other Topics Concern  . Not on file   Social History Narrative  . No narrative on file     Current Outpatient Prescriptions:  .  albuterol (PROVENTIL HFA;VENTOLIN HFA) 108 (90 Base) MCG/ACT inhaler, Inhale 2 puffs into the lungs every 6 (six) hours as needed for wheezing or shortness of breath., Disp: 1 Inhaler, Rfl: 1 .  alprazolam (XANAX) 2 MG tablet, Take 1 tablet (2 mg total) by mouth 3 (three) times daily as needed for anxiety., Disp: 90 tablet, Rfl:  0 .  amLODipine (NORVASC) 10 MG tablet, Take 1 tablet (10 mg total) by mouth daily., Disp: 90 tablet, Rfl: 1 .  benzonatate (TESSALON) 200 MG capsule, Take 1 capsule (200 mg total) by mouth 2 (two) times daily as needed for cough., Disp: 20 capsule, Rfl: 0 .  carisoprodol (SOMA) 350 MG tablet, Take 1 tablet (350 mg total) by mouth 3 (three) times daily., Disp: 90 tablet, Rfl: 0 .  COMBIVENT RESPIMAT 20-100 MCG/ACT AERS respimat, INHALE 1 PUFF INTO THE LUNGS EVERY 6 HOURS AS NEEDED FOR WHEEZING OR SHORTNESS OF BREATH, Disp: 4 g, Rfl: 2 .  fluticasone (FLONASE) 50 MCG/ACT nasal spray, USE 2 SPRAYS IN EACH NOSTRIL ONCE DAILY, Disp: 16 g, Rfl: 2 .  fluticasone furoate-vilanterol (BREO  ELLIPTA) 100-25 MCG/INH AEPB, Inhale 1 puff into the lungs daily., Disp: 3 each, Rfl: 1 .  HYDROcodone-acetaminophen (NORCO) 10-325 MG tablet, Take 1 tablet by mouth every 6 (six) hours as needed., Disp: 120 tablet, Rfl: 0 .  ibuprofen (ADVIL,MOTRIN) 200 MG tablet, Take 200 mg by mouth every 6 (six) hours as needed for mild pain. , Disp: , Rfl:  .  pantoprazole (PROTONIX) 40 MG tablet, TAKE 1 TABLET BY MOUTH ONCE A DAY, Disp: 30 tablet, Rfl: 0 .  potassium chloride (K-DUR) 10 MEQ tablet, TAKE 1 TABLET BY MOUTH ONCE A DAY, Disp: 90 tablet, Rfl: 1 .  potassium chloride (K-DUR,KLOR-CON) 10 MEQ tablet, Take 1 tablet (10 mEq total) by mouth daily., Disp: 90 tablet, Rfl: 0 .  pregabalin (LYRICA) 75 MG capsule, Take 1 capsule (75 mg total) by mouth 3 (three) times daily., Disp: 90 capsule, Rfl: 0 .  QUEtiapine (SEROQUEL) 100 MG tablet, Take 1 tablet (100 mg total) by mouth at bedtime., Disp: 90 tablet, Rfl: 1 .  venlafaxine XR (EFFEXOR-XR) 75 MG 24 hr capsule, Take 1 capsule (75 mg total) by mouth daily with breakfast., Disp: 90 capsule, Rfl: 0  Allergies  Allergen Reactions  . Penicillins Rash and Other (See Comments)    .penallergy .penallergy     Review of Systems  Constitutional: Positive for irritability. Negative for fever.  Respiratory: Positive for shortness of breath.   Gastrointestinal: Negative for bowel incontinence.  Genitourinary: Negative for bladder incontinence.  Musculoskeletal: Positive for back pain.  Neurological: Positive for tingling and numbness (numbness, burning, and aching  in feet, peripheral neuropathy.). Negative for dizziness.  Psychiatric/Behavioral: Positive for sleep disturbance. Negative for suicidal ideas. The patient is nervous/anxious and has insomnia.       Objective  Vitals:   04/07/16 1126  BP: 112/68  Pulse: 88  Resp: 16  Temp: 98.5 F (36.9 C)  TempSrc: Oral  SpO2: 94%  Weight: 129 lb 12.8 oz (58.9 kg)  Height: 5\' 8"  (1.727 m)     Physical Exam  Constitutional: She is oriented to person, place, and time and well-developed, well-nourished, and in no distress.  HENT:  Head: Normocephalic and atraumatic.  Nose: Right sinus exhibits maxillary sinus tenderness and frontal sinus tenderness. Left sinus exhibits maxillary sinus tenderness and frontal sinus tenderness.  Mouth/Throat: Posterior oropharyngeal erythema present.  Cardiovascular: Normal rate, regular rhythm and normal heart sounds.   No murmur heard. Pulmonary/Chest: Effort normal and breath sounds normal.  Abdominal: Soft. Bowel sounds are normal.  Musculoskeletal:       Lumbar back: She exhibits tenderness, pain and spasm.       Back:  Stiffness and pain over multiple finger joints  Neurological: She is alert and oriented to  person, place, and time.  Nursing note and vitals reviewed.    Assessment & Plan  1. Chronic radicular low back pain Stable, responsive to opioid therapy along with muscle relaxant for relief of muscle spasm, refills provided - carisoprodol (SOMA) 350 MG tablet; Take 1 tablet (350 mg total) by mouth 3 (three) times daily.  Dispense: 90 tablet; Refill: 0 - HYDROcodone-acetaminophen (NORCO) 10-325 MG tablet; Take 1 tablet by mouth every 6 (six) hours as needed.  Dispense: 120 tablet; Refill: 0  2. Essential hypertension  - amLODipine (NORVASC) 10 MG tablet; Take 1 tablet (10 mg total) by mouth daily.  Dispense: 90 tablet; Refill: 1  3. Hypokalemia - potassium chloride (K-DUR,KLOR-CON) 10 MEQ tablet; Take 1 tablet (10 mEq total) by mouth daily.  Dispense: 90 tablet; Refill: 0  4. Panic disorder with agoraphobia and severe panic attacks Panic disorder with agoraphobia, responsive to benzodiazepine therapy, compliant with controlled substances agreement and understands the dependence potential, side effects, and drug interactions of opioids and benzodiazepines. Refills provided and follow-up in one month - alprazolam (XANAX) 2 MG  tablet; Take 1 tablet (2 mg total) by mouth 3 (three) times daily as needed for anxiety.  Dispense: 90 tablet; Refill: 0  5. Peripheral sensory neuropathy (HCC)  - pregabalin (LYRICA) 75 MG capsule; Take 1 capsule (75 mg total) by mouth 3 (three) times daily.  Dispense: 90 capsule; Refill: 0  6. Pain in multiple finger joints I have recommended x-rays of both hands to rule out arthritis, we will start on a trial of meloxicam for 30 days, if symptoms improve, consider ordering pertinent lab work and imaging to rule out osteoarthritis, rheumatoid arthritis etc. Patient verbalized agreement with plan - meloxicam (MOBIC) 15 MG tablet; Take 1 tablet (15 mg total) by mouth daily.  Dispense: 30 tablet; Refill: 0  7. Acute recurrent pansinusitis Unresponsive to Flonase, start on Nasonex - mometasone (NASONEX) 50 MCG/ACT nasal spray; Place 2 sprays into the nose daily.  Dispense: 17 g; Refill: 1   Kwynn Schlotter [facility] 04/07/2016 11:39 AM

## 2016-04-14 ENCOUNTER — Telehealth: Payer: Self-pay | Admitting: Family Medicine

## 2016-04-14 DIAGNOSIS — J0141 Acute recurrent pansinusitis: Secondary | ICD-10-CM

## 2016-04-14 MED ORDER — DOXYCYCLINE HYCLATE 100 MG PO TABS: 100.0000 mg | ORAL_TABLET | Freq: Two times a day (BID) | ORAL | 0 refills | Status: AC

## 2016-04-14 NOTE — Telephone Encounter (Signed)
Pt states she was told that if her congestion does not get any better for her to call to get a anitbiotic. Pt is requesting that to be done.

## 2016-04-14 NOTE — Telephone Encounter (Signed)
Shouldn't has symptoms of sinus congestion, has  not been able to take Nasonex because of cost. We will start on doxycycline 100 mg twice a day 5 days.

## 2016-04-15 NOTE — Telephone Encounter (Signed)
Patient stated she had picked up script and started meds.

## 2016-04-27 ENCOUNTER — Ambulatory Visit (INDEPENDENT_AMBULATORY_CARE_PROVIDER_SITE_OTHER): Payer: Medicare HMO | Admitting: Family Medicine

## 2016-04-27 ENCOUNTER — Telehealth: Payer: Self-pay | Admitting: Family Medicine

## 2016-04-27 ENCOUNTER — Encounter: Payer: Self-pay | Admitting: Family Medicine

## 2016-04-27 DIAGNOSIS — M5416 Radiculopathy, lumbar region: Secondary | ICD-10-CM

## 2016-04-27 DIAGNOSIS — F4001 Agoraphobia with panic disorder: Secondary | ICD-10-CM | POA: Diagnosis not present

## 2016-04-27 DIAGNOSIS — G8929 Other chronic pain: Secondary | ICD-10-CM | POA: Diagnosis not present

## 2016-04-27 MED ORDER — CARISOPRODOL 350 MG PO TABS: 350.0000 mg | ORAL_TABLET | Freq: Three times a day (TID) | ORAL | 0 refills | Status: DC

## 2016-04-27 MED ORDER — ALPRAZOLAM 2 MG PO TABS: 2.0000 mg | ORAL_TABLET | Freq: Three times a day (TID) | ORAL | 0 refills | Status: DC | PRN

## 2016-04-27 MED ORDER — HYDROCODONE-ACETAMINOPHEN 10-325 MG PO TABS: 1.0000 | ORAL_TABLET | Freq: Four times a day (QID) | ORAL | 0 refills | Status: DC | PRN

## 2016-04-27 NOTE — Telephone Encounter (Signed)
It is okay for her to pick up the refills on April 25.

## 2016-04-27 NOTE — Progress Notes (Signed)
Name: Tamara Mcclure   MRN: [id number]    DOB: [birth year]   Date:04/27/2016       Progress Note  Subjective  Chief Complaint  Chief Complaint  Patient presents with  . Follow-up    1 mo  . Medication Refill    Anxiety  Presents for follow-up visit. The problem has been gradually worsening. Symptoms include depressed mood, excessive worry, insomnia, irritability, nervous/anxious behavior, panic and shortness of breath. Patient reports no dizziness or suicidal ideas. The severity of symptoms is causing significant distress and severe (anxiety increased by taking care of her grandson, getting irritated with her son). The quality of sleep is poor.   Her past medical history is significant for anxiety/panic attacks. Past treatments include benzodiazephines. The treatment provided moderate relief. Compliance with prior treatments has been good.  Back Pain  This is a chronic problem. The problem is unchanged. The pain is present in the lumbar spine. The pain does not radiate. The pain is at a severity of 6/10. The pain is moderate. The pain is the same all the time. The symptoms are aggravated by bending and position. Associated symptoms include numbness (numbness, tinlging  burning, and aching  in feet, now present in her knees peripheral neuropathy.) and tingling. Pertinent negatives include no bladder incontinence, bowel incontinence or fever. She has tried analgesics and muscle relaxant (Lyrica and Hydrocodone for low back pain and  tingling in feet.) for the symptoms.     Past Medical History:  Diagnosis Date  . Anxiety   . Chronic lower back pain   . COPD (chronic obstructive pulmonary disease) ([place])   . Depression   . Hypertension   . Panic disorder with agoraphobia and severe panic attacks   . Peripheral sensory neuropathy     Past Surgical History:  Procedure Laterality Date  . none      Family History  Problem Relation Age of Onset  . Cancer Mother     Lung  . Cancer  Father     pancreatic  . Breast cancer Neg Hx   . Ovarian cancer Neg Hx   . Colon cancer Neg Hx   . Diabetes Neg Hx   . Heart disease Neg Hx     Social History   Social History  . Marital status: Single    Spouse name: N/A  . Number of children: N/A  . Years of education: N/A   Occupational History  . Not on file.   Social History Main Topics  . Smoking status: Current Every Day Smoker    Packs/day: 1.00    Years: 30.00    Types: Cigarettes  . Smokeless tobacco: Never Used  . Alcohol use No  . Drug use: No  . Sexual activity: Not Currently   Other Topics Concern  . Not on file   Social History Narrative  . No narrative on file     Current Outpatient Prescriptions:  .  albuterol (PROVENTIL HFA;VENTOLIN HFA) 108 (90 Base) MCG/ACT inhaler, Inhale 2 puffs into the lungs every 6 (six) hours as needed for wheezing or shortness of breath., Disp: 1 Inhaler, Rfl: 1 .  alprazolam (XANAX) 2 MG tablet, Take 1 tablet (2 mg total) by mouth 3 (three) times daily as needed for anxiety., Disp: 90 tablet, Rfl: 0 .  amLODipine (NORVASC) 10 MG tablet, Take 1 tablet (10 mg total) by mouth daily., Disp: 90 tablet, Rfl: 1 .  benzonatate (TESSALON) 200 MG capsule, Take 1 capsule (200 mg total)  by mouth 2 (two) times daily as needed for cough., Disp: 20 capsule, Rfl: 0 .  carisoprodol (SOMA) 350 MG tablet, Take 1 tablet (350 mg total) by mouth 3 (three) times daily., Disp: 90 tablet, Rfl: 0 .  COMBIVENT RESPIMAT 20-100 MCG/ACT AERS respimat, INHALE 1 PUFF INTO THE LUNGS EVERY 6 HOURS AS NEEDED FOR WHEEZING OR SHORTNESS OF BREATH, Disp: 4 g, Rfl: 2 .  fluticasone furoate-vilanterol (BREO ELLIPTA) 100-25 MCG/INH AEPB, Inhale 1 puff into the lungs daily., Disp: 3 each, Rfl: 1 .  HYDROcodone-acetaminophen (NORCO) 10-325 MG tablet, Take 1 tablet by mouth every 6 (six) hours as needed., Disp: 120 tablet, Rfl: 0 .  ibuprofen (ADVIL,MOTRIN) 200 MG tablet, Take 200 mg by mouth every 6 (six) hours as  needed for mild pain. , Disp: , Rfl:  .  meloxicam (MOBIC) 15 MG tablet, Take 1 tablet (15 mg total) by mouth daily., Disp: 30 tablet, Rfl: 0 .  mometasone (NASONEX) 50 MCG/ACT nasal spray, Place 2 sprays into the nose daily., Disp: 17 g, Rfl: 1 .  pantoprazole (PROTONIX) 40 MG tablet, TAKE 1 TABLET BY MOUTH ONCE A DAY, Disp: 30 tablet, Rfl: 0 .  potassium chloride (K-DUR) 10 MEQ tablet, TAKE 1 TABLET BY MOUTH ONCE A DAY, Disp: 90 tablet, Rfl: 1 .  potassium chloride (K-DUR,KLOR-CON) 10 MEQ tablet, Take 1 tablet (10 mEq total) by mouth daily., Disp: 90 tablet, Rfl: 0 .  pregabalin (LYRICA) 75 MG capsule, Take 1 capsule (75 mg total) by mouth 3 (three) times daily., Disp: 90 capsule, Rfl: 0 .  QUEtiapine (SEROQUEL) 100 MG tablet, Take 1 tablet (100 mg total) by mouth at bedtime., Disp: 90 tablet, Rfl: 1 .  venlafaxine XR (EFFEXOR-XR) 75 MG 24 hr capsule, Take 1 capsule (75 mg total) by mouth daily with breakfast., Disp: 90 capsule, Rfl: 0  Allergies  Allergen Reactions  . Penicillins Rash and Other (See Comments)    .penallergy .penallergy     Review of Systems  Constitutional: Positive for irritability. Negative for fever.  Respiratory: Positive for shortness of breath.   Gastrointestinal: Negative for bowel incontinence.  Genitourinary: Negative for bladder incontinence.  Musculoskeletal: Positive for back pain.  Neurological: Positive for tingling and numbness (numbness, tinlging  burning, and aching  in feet, now present in her knees peripheral neuropathy.). Negative for dizziness.  Psychiatric/Behavioral: Negative for suicidal ideas. The patient is nervous/anxious and has insomnia.      Objective  Vitals:   04/27/16 1134  BP: 114/67  Pulse: 95  Resp: 17  Temp: 98.8 F (37.1 C)  TempSrc: Oral  SpO2: 93%  Weight: 130 lb 9.6 oz (59.2 kg)  Height: 5\' 8"  (1.727 m)    Physical Exam  Constitutional: She is oriented to person, place, and time and well-developed,  well-nourished, and in no distress.  Cardiovascular: Normal rate, regular rhythm and normal heart sounds.   Pulmonary/Chest: Effort normal and breath sounds normal. She has no wheezes.  Musculoskeletal:       Lumbar back: She exhibits tenderness, pain and spasm.       Back:  Neurological: She is alert and oriented to person, place, and time.  Psychiatric: Mood, memory, affect and judgment normal.  Nursing note and vitals reviewed.       Assessment & Plan  1. Chronic radicular low back pain Stable, responsive to hydrocodone taken up to 4 times daily as needed, Soma for muscle relaxant or persists, patient compliant with controlled substances agreement. Refills provided and follow-up  in one month - carisoprodol (SOMA) 350 MG tablet; Take 1 tablet (350 mg total) by mouth 3 (three) times daily.  Dispense: 90 tablet; Refill: 0 - HYDROcodone-acetaminophen (NORCO) 10-325 MG tablet; Take 1 tablet by mouth every 6 (six) hours as needed.  Dispense: 120 tablet; Refill: 0  2. Panic disorder with agoraphobia and severe panic attacks Overall stable, acutely worse because of family stressors including dealing with her son. Continue on alprazolam as prescribed, patient compliant with controlled substances agreement and understands the dependence potential, side effects and interactions of benzodiazepines. Refills provided - alprazolam (XANAX) 2 MG tablet; Take 1 tablet (2 mg total) by mouth 3 (three) times daily as needed for anxiety.  Dispense: 90 tablet; Refill: 0   Madine Sarr [facility] 04/27/2016 11:39 AM

## 2016-04-27 NOTE — Telephone Encounter (Signed)
Pt wants to know if its ok for her to get her pain medication early? She would like to pick it up on the 25th due to not having a ride.

## 2016-05-02 ENCOUNTER — Telehealth: Payer: Self-pay | Admitting: Family Medicine

## 2016-05-02 NOTE — Telephone Encounter (Signed)
Pharmacy has been notified.

## 2016-05-02 NOTE — Telephone Encounter (Signed)
PT IS NEEDING TO SEE IF IT WOULD BE OK IF THE PHARM COULD FILL HER MEDICATION ON Friday April 27 AND THE PHARM WILL DELIVER HER MEDS FOR HER MON- FRI SINCE SHE DOES NOT DRIVE FOR THE PHARM DOES NOT DELIVER ON Sat BUT IF SHE COULD GET THIS OKAYED FOR FRI THEY WILL DELIVER.

## 2016-05-03 NOTE — Telephone Encounter (Signed)
Approved for refill on Friday, April 27.

## 2016-05-23 ENCOUNTER — Telehealth: Payer: Self-pay | Admitting: Family Medicine

## 2016-05-23 NOTE — Telephone Encounter (Signed)
Pt states the last nose spray that was given to her, her insurance did not cover it. They will cover Nasonex. Pt is asking for a RX to be sent to [facility].

## 2016-05-24 NOTE — Telephone Encounter (Signed)
According to her med list, Nasonex is the one nasal spray that she has been prescribed, please confirm.

## 2016-05-27 ENCOUNTER — Ambulatory Visit (INDEPENDENT_AMBULATORY_CARE_PROVIDER_SITE_OTHER): Payer: Medicare HMO | Admitting: Family Medicine

## 2016-05-27 ENCOUNTER — Encounter: Payer: Self-pay | Admitting: Family Medicine

## 2016-05-27 VITALS — BP 118/64 | HR 89 | Temp 97.4°F | Resp 16 | Ht 68.0 in | Wt 130.0 lb

## 2016-05-27 DIAGNOSIS — G629 Polyneuropathy, unspecified: Secondary | ICD-10-CM | POA: Diagnosis not present

## 2016-05-27 DIAGNOSIS — G8929 Other chronic pain: Secondary | ICD-10-CM

## 2016-05-27 DIAGNOSIS — E7800 Pure hypercholesterolemia, unspecified: Secondary | ICD-10-CM

## 2016-05-27 DIAGNOSIS — F4001 Agoraphobia with panic disorder: Secondary | ICD-10-CM

## 2016-05-27 DIAGNOSIS — M5416 Radiculopathy, lumbar region: Secondary | ICD-10-CM

## 2016-05-27 DIAGNOSIS — G608 Other hereditary and idiopathic neuropathies: Secondary | ICD-10-CM

## 2016-05-27 MED ORDER — PREGABALIN 75 MG PO CAPS: 75.0000 mg | ORAL_CAPSULE | Freq: Three times a day (TID) | ORAL | 0 refills | Status: DC

## 2016-05-27 MED ORDER — HYDROCODONE-ACETAMINOPHEN 10-325 MG PO TABS: 1.0000 | ORAL_TABLET | Freq: Four times a day (QID) | ORAL | 0 refills | Status: DC | PRN

## 2016-05-27 MED ORDER — CARISOPRODOL 350 MG PO TABS: 350.0000 mg | ORAL_TABLET | Freq: Three times a day (TID) | ORAL | 0 refills | Status: DC

## 2016-05-27 MED ORDER — ALPRAZOLAM 2 MG PO TABS: 2.0000 mg | ORAL_TABLET | Freq: Three times a day (TID) | ORAL | 0 refills | Status: DC | PRN

## 2016-05-27 NOTE — Telephone Encounter (Signed)
Patient coming in for appointment will discuss at that time

## 2016-05-27 NOTE — Progress Notes (Signed)
Name: Tamara Mcclure   MRN: [id number]    DOB: 1955/06/22   Date:05/27/2016       Progress Note  Subjective  Chief Complaint  Chief Complaint  Patient presents with  . Follow-up    1 mo  . Medication Refill    Anxiety  Presents for follow-up visit. The problem has been gradually worsening. Symptoms include depressed mood, excessive worry, insomnia, irritability, nervous/anxious behavior, panic and shortness of breath. Patient reports no dizziness or suicidal ideas. The severity of symptoms is causing significant distress and severe. The quality of sleep is poor.   Her past medical history is significant for anxiety/panic attacks. Past treatments include benzodiazephines. The treatment provided moderate relief. Compliance with prior treatments has been good.  Back Pain  This is a chronic problem. The problem has been gradually improving since onset. The pain is present in the lumbar spine. The pain does not radiate. The pain is at a severity of 5/10. The pain is moderate. The pain is the same all the time. The symptoms are aggravated by bending and position. Associated symptoms include numbness (numbness, burning, and aching  in feet, peripheral neuropathy.) and tingling. Pertinent negatives include no bladder incontinence, bowel incontinence, fever or leg pain. She has tried analgesics and muscle relaxant (Lyrica and Hydrocodone for low back pain and  tingling in feet.) for the symptoms.  Hyperlipidemia  This is a chronic problem. The problem is uncontrolled. Recent lipid tests were reviewed and are high. Associated symptoms include shortness of breath. Pertinent negatives include no leg pain or myalgias. She is currently on no antihyperlipidemic treatment.     Past Medical History:  Diagnosis Date  . Anxiety   . Chronic lower back pain   . COPD (chronic obstructive pulmonary disease) ([place])   . Depression   . Hypertension   . Panic disorder with agoraphobia and severe panic attacks   .  Peripheral sensory neuropathy     Past Surgical History:  Procedure Laterality Date  . none      Family History  Problem Relation Age of Onset  . Cancer Mother        Lung  . Cancer Father        pancreatic  . Breast cancer Neg Hx   . Ovarian cancer Neg Hx   . Colon cancer Neg Hx   . Diabetes Neg Hx   . Heart disease Neg Hx     Social History   Social History  . Marital status: Single    Spouse name: N/A  . Number of children: N/A  . Years of education: N/A   Occupational History  . Not on file.   Social History Main Topics  . Smoking status: Current Every Day Smoker    Packs/day: 1.00    Years: 30.00    Types: Cigarettes  . Smokeless tobacco: Never Used  . Alcohol use No  . Drug use: No  . Sexual activity: Not Currently   Other Topics Concern  . Not on file   Social History Narrative  . No narrative on file     Current Outpatient Prescriptions:  .  albuterol (PROVENTIL HFA;VENTOLIN HFA) 108 (90 Base) MCG/ACT inhaler, Inhale 2 puffs into the lungs every 6 (six) hours as needed for wheezing or shortness of breath., Disp: 1 Inhaler, Rfl: 1 .  alprazolam (XANAX) 2 MG tablet, Take 1 tablet (2 mg total) by mouth 3 (three) times daily as needed for anxiety., Disp: 90 tablet, Rfl: 0 .  amLODipine (NORVASC) 10 MG tablet, Take 1 tablet (10 mg total) by mouth daily., Disp: 90 tablet, Rfl: 1 .  benzonatate (TESSALON) 200 MG capsule, Take 1 capsule (200 mg total) by mouth 2 (two) times daily as needed for cough., Disp: 20 capsule, Rfl: 0 .  carisoprodol (SOMA) 350 MG tablet, Take 1 tablet (350 mg total) by mouth 3 (three) times daily., Disp: 90 tablet, Rfl: 0 .  COMBIVENT RESPIMAT 20-100 MCG/ACT AERS respimat, INHALE 1 PUFF INTO THE LUNGS EVERY 6 HOURS AS NEEDED FOR WHEEZING OR SHORTNESS OF BREATH, Disp: 4 g, Rfl: 2 .  fluticasone furoate-vilanterol (BREO ELLIPTA) 100-25 MCG/INH AEPB, Inhale 1 puff into the lungs daily., Disp: 3 each, Rfl: 1 .  HYDROcodone-acetaminophen  (NORCO) 10-325 MG tablet, Take 1 tablet by mouth every 6 (six) hours as needed., Disp: 120 tablet, Rfl: 0 .  ibuprofen (ADVIL,MOTRIN) 200 MG tablet, Take 200 mg by mouth every 6 (six) hours as needed for mild pain. , Disp: , Rfl:  .  meloxicam (MOBIC) 15 MG tablet, Take 1 tablet (15 mg total) by mouth daily., Disp: 30 tablet, Rfl: 0 .  mometasone (NASONEX) 50 MCG/ACT nasal spray, Place 2 sprays into the nose daily., Disp: 17 g, Rfl: 1 .  pantoprazole (PROTONIX) 40 MG tablet, TAKE 1 TABLET BY MOUTH ONCE A DAY, Disp: 30 tablet, Rfl: 0 .  potassium chloride (K-DUR) 10 MEQ tablet, TAKE 1 TABLET BY MOUTH ONCE A DAY, Disp: 90 tablet, Rfl: 1 .  potassium chloride (K-DUR,KLOR-CON) 10 MEQ tablet, Take 1 tablet (10 mEq total) by mouth daily., Disp: 90 tablet, Rfl: 0 .  pregabalin (LYRICA) 75 MG capsule, Take 1 capsule (75 mg total) by mouth 3 (three) times daily., Disp: 90 capsule, Rfl: 0 .  QUEtiapine (SEROQUEL) 100 MG tablet, Take 1 tablet (100 mg total) by mouth at bedtime., Disp: 90 tablet, Rfl: 1 .  venlafaxine XR (EFFEXOR-XR) 75 MG 24 hr capsule, Take 1 capsule (75 mg total) by mouth daily with breakfast., Disp: 90 capsule, Rfl: 0  Allergies  Allergen Reactions  . Penicillins Rash and Other (See Comments)    .penallergy .penallergy     Review of Systems  Constitutional: Positive for irritability. Negative for fever.  Respiratory: Positive for shortness of breath.   Gastrointestinal: Negative for bowel incontinence.  Genitourinary: Negative for bladder incontinence.  Musculoskeletal: Positive for back pain. Negative for myalgias.  Neurological: Positive for tingling and numbness (numbness, burning, and aching  in feet, peripheral neuropathy.). Negative for dizziness.  Psychiatric/Behavioral: Negative for suicidal ideas. The patient is nervous/anxious and has insomnia.      Objective  Vitals:   05/27/16 1124  BP: 118/64  Pulse: 89  Resp: 16  Temp: 97.4 F (36.3 C)  TempSrc: Oral    SpO2: 93%  Weight: 130 lb (59 kg)  Height: 5\' 8"  (1.727 m)    Physical Exam  Constitutional: She is oriented to person, place, and time and well-developed, well-nourished, and in no distress.  Cardiovascular: Normal rate, regular rhythm and normal heart sounds.   Pulmonary/Chest: Effort normal and breath sounds normal. She has no wheezes.  Musculoskeletal:       Lumbar back: She exhibits tenderness, pain and spasm.       Back:  Neurological: She is alert and oriented to person, place, and time.  Psychiatric: Mood, memory, affect and judgment normal.  Nursing note and vitals reviewed.     Assessment & Plan  1. Chronic radicular low back pain Stable and marginally  improved from last month, continue on hydrocodone and Soma, patient compliant with controlled substances agreement. Refills provided - HYDROcodone-acetaminophen (NORCO) 10-325 MG tablet; Take 1 tablet by mouth every 6 (six) hours as needed.  Dispense: 120 tablet; Refill: 0 - carisoprodol (SOMA) 350 MG tablet; Take 1 tablet (350 mg total) by mouth 3 (three) times daily.  Dispense: 90 tablet; Refill: 0  2. Panic disorder with agoraphobia and severe panic attacks Relatively improved and responsive to alprazolam taken 3 times daily when needed, patient compliant with controlled substances agreement and understands the dependence potential, side effects and drug interactions of benzodiazepines. Refills provided - alprazolam (XANAX) 2 MG tablet; Take 1 tablet (2 mg total) by mouth 3 (three) times daily as needed for anxiety.  Dispense: 90 tablet; Refill: 0  3. Peripheral sensory neuropathy  - pregabalin (LYRICA) 75 MG capsule; Take 1 capsule (75 mg total) by mouth 3 (three) times daily.  Dispense: 90 capsule; Refill: 0  4. Pure hypercholesterolemia Obtain FLP, consider starting on statin  - Lipid panel - COMPLETE METABOLIC PANEL WITH GFR   Chermaine Schnyder [facility] 05/27/2016 11:32 AM

## 2016-05-30 ENCOUNTER — Telehealth: Payer: Self-pay | Admitting: Family Medicine

## 2016-05-30 NOTE — Telephone Encounter (Signed)
Returned call and left a voice message for the patient. 

## 2016-05-30 NOTE — Telephone Encounter (Signed)
Pt would like a call back about her Nasonex.

## 2016-06-27 ENCOUNTER — Encounter: Payer: Self-pay | Admitting: Family Medicine

## 2016-06-27 ENCOUNTER — Telehealth: Payer: Self-pay | Admitting: Family Medicine

## 2016-06-27 ENCOUNTER — Ambulatory Visit (INDEPENDENT_AMBULATORY_CARE_PROVIDER_SITE_OTHER): Payer: Medicare HMO | Admitting: Family Medicine

## 2016-06-27 DIAGNOSIS — G8929 Other chronic pain: Secondary | ICD-10-CM

## 2016-06-27 DIAGNOSIS — G629 Polyneuropathy, unspecified: Secondary | ICD-10-CM

## 2016-06-27 DIAGNOSIS — G608 Other hereditary and idiopathic neuropathies: Secondary | ICD-10-CM

## 2016-06-27 DIAGNOSIS — M5416 Radiculopathy, lumbar region: Secondary | ICD-10-CM | POA: Diagnosis not present

## 2016-06-27 DIAGNOSIS — F4001 Agoraphobia with panic disorder: Secondary | ICD-10-CM | POA: Diagnosis not present

## 2016-06-27 MED ORDER — CARISOPRODOL 350 MG PO TABS: 350.0000 mg | ORAL_TABLET | Freq: Three times a day (TID) | ORAL | 0 refills | Status: DC

## 2016-06-27 MED ORDER — ALPRAZOLAM 2 MG PO TABS: 2.0000 mg | ORAL_TABLET | Freq: Three times a day (TID) | ORAL | 0 refills | Status: DC | PRN

## 2016-06-27 MED ORDER — PREGABALIN 75 MG PO CAPS: 75.0000 mg | ORAL_CAPSULE | Freq: Three times a day (TID) | ORAL | 0 refills | Status: DC

## 2016-06-27 MED ORDER — HYDROCODONE-ACETAMINOPHEN 10-325 MG PO TABS: 1.0000 | ORAL_TABLET | Freq: Four times a day (QID) | ORAL | 0 refills | Status: DC | PRN

## 2016-06-27 NOTE — Progress Notes (Signed)
Name: Tamara Mcclure   MRN: [id number]    DOB: 23-May-1955   Date:06/27/2016       Progress Note  Subjective  Chief Complaint  Chief Complaint  Patient presents with  . Follow-up    1 mo  . Medication Refill    Anxiety  Presents for follow-up visit. The problem has been gradually worsening. Symptoms include depressed mood, excessive worry, insomnia, irritability, nervous/anxious behavior and panic. Patient reports no dizziness or suicidal ideas. The severity of symptoms is causing significant distress and severe. The quality of sleep is poor.   Her past medical history is significant for anxiety/panic attacks. Past treatments include benzodiazephines. The treatment provided moderate relief. Compliance with prior treatments has been good.  Back Pain  This is a chronic problem. The problem is unchanged. The pain is present in the lumbar spine. The pain does not radiate. The pain is at a severity of 8/10 (worse with associated muscle spasm). The pain is moderate. The pain is the same all the time. The symptoms are aggravated by bending and position. Associated symptoms include numbness (numbness, burning, and aching  in feet, peripheral neuropathy.) and tingling. Pertinent negatives include no bladder incontinence, bowel incontinence or fever. She has tried analgesics and muscle relaxant (Lyrica and Hydrocodone for low back pain and  tingling in feet.) for the symptoms.    Past Medical History:  Diagnosis Date  . Anxiety   . Chronic lower back pain   . COPD (chronic obstructive pulmonary disease) ([place])   . Depression   . Hypertension   . Panic disorder with agoraphobia and severe panic attacks   . Peripheral sensory neuropathy     Past Surgical History:  Procedure Laterality Date  . none      Family History  Problem Relation Age of Onset  . Cancer Mother        Lung  . Cancer Father        pancreatic  . Breast cancer Neg Hx   . Ovarian cancer Neg Hx   . Colon cancer Neg Hx     . Diabetes Neg Hx   . Heart disease Neg Hx     Social History   Social History  . Marital status: Single    Spouse name: N/A  . Number of children: N/A  . Years of education: N/A   Occupational History  . Not on file.   Social History Main Topics  . Smoking status: Current Every Day Smoker    Packs/day: 1.00    Years: 30.00    Types: Cigarettes  . Smokeless tobacco: Never Used  . Alcohol use No  . Drug use: No  . Sexual activity: Not Currently   Other Topics Concern  . Not on file   Social History Narrative  . No narrative on file     Current Outpatient Prescriptions:  .  albuterol (PROVENTIL HFA;VENTOLIN HFA) 108 (90 Base) MCG/ACT inhaler, Inhale 2 puffs into the lungs every 6 (six) hours as needed for wheezing or shortness of breath., Disp: 1 Inhaler, Rfl: 1 .  alprazolam (XANAX) 2 MG tablet, Take 1 tablet (2 mg total) by mouth 3 (three) times daily as needed for anxiety., Disp: 90 tablet, Rfl: 0 .  amLODipine (NORVASC) 10 MG tablet, Take 1 tablet (10 mg total) by mouth daily., Disp: 90 tablet, Rfl: 1 .  benzonatate (TESSALON) 200 MG capsule, Take 1 capsule (200 mg total) by mouth 2 (two) times daily as needed for cough., Disp: 20  capsule, Rfl: 0 .  carisoprodol (SOMA) 350 MG tablet, Take 1 tablet (350 mg total) by mouth 3 (three) times daily., Disp: 90 tablet, Rfl: 0 .  COMBIVENT RESPIMAT 20-100 MCG/ACT AERS respimat, INHALE 1 PUFF INTO THE LUNGS EVERY 6 HOURS AS NEEDED FOR WHEEZING OR SHORTNESS OF BREATH, Disp: 4 g, Rfl: 2 .  fluticasone furoate-vilanterol (BREO ELLIPTA) 100-25 MCG/INH AEPB, Inhale 1 puff into the lungs daily., Disp: 3 each, Rfl: 1 .  HYDROcodone-acetaminophen (NORCO) 10-325 MG tablet, Take 1 tablet by mouth every 6 (six) hours as needed., Disp: 120 tablet, Rfl: 0 .  ibuprofen (ADVIL,MOTRIN) 200 MG tablet, Take 200 mg by mouth every 6 (six) hours as needed for mild pain. , Disp: , Rfl:  .  meloxicam (MOBIC) 15 MG tablet, Take 1 tablet (15 mg total) by  mouth daily., Disp: 30 tablet, Rfl: 0 .  mometasone (NASONEX) 50 MCG/ACT nasal spray, Place 2 sprays into the nose daily., Disp: 17 g, Rfl: 1 .  pantoprazole (PROTONIX) 40 MG tablet, TAKE 1 TABLET BY MOUTH ONCE A DAY, Disp: 30 tablet, Rfl: 0 .  potassium chloride (K-DUR) 10 MEQ tablet, TAKE 1 TABLET BY MOUTH ONCE A DAY, Disp: 90 tablet, Rfl: 1 .  potassium chloride (K-DUR,KLOR-CON) 10 MEQ tablet, Take 1 tablet (10 mEq total) by mouth daily., Disp: 90 tablet, Rfl: 0 .  pregabalin (LYRICA) 75 MG capsule, Take 1 capsule (75 mg total) by mouth 3 (three) times daily., Disp: 90 capsule, Rfl: 0 .  QUEtiapine (SEROQUEL) 100 MG tablet, Take 1 tablet (100 mg total) by mouth at bedtime., Disp: 90 tablet, Rfl: 1 .  venlafaxine XR (EFFEXOR-XR) 75 MG 24 hr capsule, Take 1 capsule (75 mg total) by mouth daily with breakfast., Disp: 90 capsule, Rfl: 0  Allergies  Allergen Reactions  . Penicillins Rash and Other (See Comments)    .penallergy .penallergy     Review of Systems  Constitutional: Positive for irritability. Negative for fever.  Gastrointestinal: Negative for bowel incontinence.  Genitourinary: Negative for bladder incontinence.  Musculoskeletal: Positive for back pain.  Neurological: Positive for tingling and numbness (numbness, burning, and aching  in feet, peripheral neuropathy.). Negative for dizziness.  Psychiatric/Behavioral: Negative for suicidal ideas. The patient is nervous/anxious and has insomnia.      Objective  Vitals:   06/27/16 1156  BP: 120/71  Pulse: 87  Resp: 16  Temp: 97.7 F (36.5 C)  TempSrc: Oral  SpO2: 98%  Weight: 128 lb 9.6 oz (58.3 kg)  Height: 5\' 8"  (1.727 m)    Physical Exam  Constitutional: She is oriented to person, place, and time and well-developed, well-nourished, and in no distress.  HENT:  Head: Normocephalic and atraumatic.  Cardiovascular: Normal rate, regular rhythm and normal heart sounds.   No murmur heard. Pulmonary/Chest: Effort  normal and breath sounds normal. She has no wheezes.  Musculoskeletal: She exhibits no edema.       Lumbar back: She exhibits tenderness, pain and spasm.       Back:  Neurological: She is alert and oriented to person, place, and time.  Psychiatric: Memory and judgment normal. Her mood appears anxious. She has a flat affect.  Nursing note and vitals reviewed.      Assessment & Plan  1. Chronic radicular low back pain Stable and responsive to opioid therapy, patient compliant with controlled substances agreement. Refills provided - HYDROcodone-acetaminophen (NORCO) 10-325 MG tablet; Take 1 tablet by mouth every 6 (six) hours as needed.  Dispense: 120 tablet;  Refill: 0 - carisoprodol (SOMA) 350 MG tablet; Take 1 tablet (350 mg total) by mouth 3 (three) times daily.  Dispense: 90 tablet; Refill: 0  2. Panic disorder with agoraphobia and severe panic attacks Stable and responsive to alprazolam taken up to 3 times daily when needed. Refills provided - alprazolam (XANAX) 2 MG tablet; Take 1 tablet (2 mg total) by mouth 3 (three) times daily as needed for anxiety.  Dispense: 90 tablet; Refill: 0  3. Peripheral sensory neuropathy  - pregabalin (LYRICA) 75 MG capsule; Take 1 capsule (75 mg total) by mouth 3 (three) times daily.  Dispense: 90 capsule; Refill: 0   Lucretia Pendley [facility] 06/27/2016 12:47 PM

## 2016-06-27 NOTE — Telephone Encounter (Signed)
Pt needs a referral to [facility]. Fax # is [phone]

## 2016-06-28 ENCOUNTER — Other Ambulatory Visit: Payer: Self-pay

## 2016-06-28 DIAGNOSIS — Z0100 Encounter for examination of eyes and vision without abnormal findings: Secondary | ICD-10-CM

## 2016-06-28 NOTE — Telephone Encounter (Signed)
Referral has been placed and sent to [facility] [place], [place] [address])

## 2016-07-05 ENCOUNTER — Telehealth: Payer: Self-pay | Admitting: Family Medicine

## 2016-07-05 DIAGNOSIS — G8929 Other chronic pain: Secondary | ICD-10-CM

## 2016-07-05 DIAGNOSIS — M5137 Other intervertebral disc degeneration, lumbosacral region: Secondary | ICD-10-CM

## 2016-07-05 DIAGNOSIS — M5416 Radiculopathy, lumbar region: Principal | ICD-10-CM

## 2016-07-05 MED ORDER — PREDNISONE 10 MG (21) PO TBPK: ORAL_TABLET | ORAL | 0 refills | Status: DC

## 2016-07-05 NOTE — Telephone Encounter (Signed)
Patient was given prednisone 6 day taper Dosepak in November 2017 to help with sinusitis and she noted that it really helped her back as well. She is asking for a repeat prescription for prednisone to help her worsening low back pain. I have explained that she should not take ibuprofen along with prednisone and will call in one 6 day tapering course of prednisone to her pharmacy. She verbalized agreement

## 2016-07-05 NOTE — Telephone Encounter (Signed)
PT SAID THAT SHE CAN NOT HARDLY MOVE AND THAT SHE SEEN YOU LAST WEEK AND TOLD YOU OF THIS PROBLEM SHE IS HAVING BUT IT HAS JUST GOT WORSE AND YOU SAID SOMETHING ABOUT GIVING HER STEROIDS LIKE BEFORE THAT HELPED. TOLD HER THAT SHE WOULD HAVE TO DO AN APPT TO EVALUATE HER CONDITION FOR WHAT THERAPY TO DO FOR HER. SAID THAT SHE WOULD CALL BACK . TRIED TO GIVE HER MULTIPLE APPT SLOTS.

## 2016-07-05 NOTE — Telephone Encounter (Signed)
Please schedule patient for an office visit appointment to evaluate her symptoms and prescribe appropriate therapy

## 2016-07-05 NOTE — Telephone Encounter (Signed)
PT SAID THAT SHE IS ION SO MUCH PAIN IN HER LOWER BACK. SAID THAT YOU HAD GIVEN HER STEROIDS BEFORE AND IT HELPED. WANTS TO KNOW IF YOU WOULD DO THIS AGAIN FOR HER. PHARM IS [facility].

## 2016-07-11 ENCOUNTER — Other Ambulatory Visit: Payer: Self-pay | Admitting: Family Medicine

## 2016-07-11 DIAGNOSIS — E876 Hypokalemia: Secondary | ICD-10-CM

## 2016-07-12 ENCOUNTER — Other Ambulatory Visit: Payer: Self-pay | Admitting: Family Medicine

## 2016-07-12 DIAGNOSIS — E876 Hypokalemia: Secondary | ICD-10-CM

## 2016-07-12 MED ORDER — POTASSIUM CHLORIDE CRYS ER 10 MEQ PO TBCR: 10.0000 meq | EXTENDED_RELEASE_TABLET | Freq: Every day | ORAL | 0 refills | Status: DC

## 2016-07-12 NOTE — Telephone Encounter (Signed)
Pt only have about 5 pills left of her potassium pills and is asking that you send a refill to [facility]. Pt has appointment scheduled for 07/27/2016.

## 2016-07-27 ENCOUNTER — Ambulatory Visit: Payer: Medicare HMO | Admitting: Family Medicine

## 2016-07-28 ENCOUNTER — Ambulatory Visit (INDEPENDENT_AMBULATORY_CARE_PROVIDER_SITE_OTHER): Payer: Medicare HMO | Admitting: Family Medicine

## 2016-07-28 ENCOUNTER — Encounter: Payer: Self-pay | Admitting: Family Medicine

## 2016-07-28 ENCOUNTER — Telehealth: Payer: Self-pay | Admitting: Family Medicine

## 2016-07-28 VITALS — BP 118/74 | HR 80 | Temp 97.5°F | Resp 16 | Ht 68.0 in | Wt 129.7 lb

## 2016-07-28 DIAGNOSIS — M5416 Radiculopathy, lumbar region: Secondary | ICD-10-CM

## 2016-07-28 DIAGNOSIS — G629 Polyneuropathy, unspecified: Secondary | ICD-10-CM

## 2016-07-28 DIAGNOSIS — G608 Other hereditary and idiopathic neuropathies: Secondary | ICD-10-CM

## 2016-07-28 DIAGNOSIS — F5105 Insomnia due to other mental disorder: Secondary | ICD-10-CM | POA: Diagnosis not present

## 2016-07-28 DIAGNOSIS — G8929 Other chronic pain: Secondary | ICD-10-CM | POA: Diagnosis not present

## 2016-07-28 DIAGNOSIS — F99 Mental disorder, not otherwise specified: Secondary | ICD-10-CM | POA: Diagnosis not present

## 2016-07-28 DIAGNOSIS — F4001 Agoraphobia with panic disorder: Secondary | ICD-10-CM

## 2016-07-28 DIAGNOSIS — J0101 Acute recurrent maxillary sinusitis: Secondary | ICD-10-CM

## 2016-07-28 MED ORDER — HYDROCODONE-ACETAMINOPHEN 10-325 MG PO TABS: 1.0000 | ORAL_TABLET | Freq: Four times a day (QID) | ORAL | 0 refills | Status: DC | PRN

## 2016-07-28 MED ORDER — CARISOPRODOL 350 MG PO TABS: 350.0000 mg | ORAL_TABLET | Freq: Three times a day (TID) | ORAL | 0 refills | Status: DC

## 2016-07-28 MED ORDER — QUETIAPINE FUMARATE 100 MG PO TABS: 100.0000 mg | ORAL_TABLET | Freq: Every day | ORAL | 1 refills | Status: DC

## 2016-07-28 MED ORDER — PREGABALIN 75 MG PO CAPS: 75.0000 mg | ORAL_CAPSULE | Freq: Three times a day (TID) | ORAL | 0 refills | Status: DC

## 2016-07-28 MED ORDER — ALPRAZOLAM 2 MG PO TABS: 2.0000 mg | ORAL_TABLET | Freq: Three times a day (TID) | ORAL | 0 refills | Status: DC | PRN

## 2016-07-28 MED ORDER — CLINDAMYCIN HCL 300 MG PO CAPS: 300.0000 mg | ORAL_CAPSULE | Freq: Four times a day (QID) | ORAL | 0 refills | Status: AC

## 2016-07-28 NOTE — Telephone Encounter (Signed)
Referral has been sent.

## 2016-07-28 NOTE — Telephone Encounter (Signed)
Pt was seen today and states that you are going to refer her to [facility]. Calling to give the fax number [phone] and the phone number is [phone]

## 2016-07-28 NOTE — Telephone Encounter (Signed)
Pt informed

## 2016-07-28 NOTE — Progress Notes (Signed)
Name: Tamara Mcclure   MRN: [id number]    DOB: 05/25/1955   Date:07/28/2016       Progress Note  Subjective  Chief Complaint  Chief Complaint  Patient presents with  . Follow-up    1 mo  . Medication Refill    Anxiety  Presents for follow-up visit. The problem has been gradually worsening. Symptoms include depressed mood, excessive worry, insomnia, irritability, nervous/anxious behavior, panic and shortness of breath. Patient reports no dizziness or suicidal ideas. The severity of symptoms is causing significant distress, severe and interfering with daily activities. The quality of sleep is poor.   Her past medical history is significant for anxiety/panic attacks. Past treatments include benzodiazephines. The treatment provided moderate relief. Compliance with prior treatments has been good.  Back Pain  This is a chronic problem. The problem is unchanged. The pain is present in the lumbar spine. The pain radiates to the left thigh and right thigh. The pain is at a severity of 6/10. The pain is moderate. The pain is the same all the time. The symptoms are aggravated by bending and position. Associated symptoms include leg pain, numbness (numbness, burning, and aching  in feet, peripheral neuropathy.) and tingling. Pertinent negatives include no bladder incontinence, bowel incontinence or fever. She has tried analgesics and muscle relaxant (Lyrica and Hydrocodone for low back pain and  tingling in feet.) for the symptoms.  Insomnia  Primary symptoms: sleep disturbance, no difficulty falling asleep, frequent awakening.  The onset quality is gradual. The problem is unchanged. The symptoms are aggravated by anxiety. Typical bedtime:  8-10 P.M..  How long after going to bed to you fall asleep: 15-30 minutes.    Sinusitis  This is a recurrent problem. The problem is unchanged. There has been no fever. Associated symptoms include congestion, coughing, shortness of breath and sinus pressure. Pertinent  negatives include no sore throat. Past treatments include oral decongestants (Guaifenesin).     Past Medical History:  Diagnosis Date  . Anxiety   . Chronic lower back pain   . COPD (chronic obstructive pulmonary disease) ([place])   . Depression   . Hypertension   . Panic disorder with agoraphobia and severe panic attacks   . Peripheral sensory neuropathy     Past Surgical History:  Procedure Laterality Date  . none      Family History  Problem Relation Age of Onset  . Cancer Mother        Lung  . Cancer Father        pancreatic  . Breast cancer Neg Hx   . Ovarian cancer Neg Hx   . Colon cancer Neg Hx   . Diabetes Neg Hx   . Heart disease Neg Hx     Social History   Social History  . Marital status: Single    Spouse name: N/A  . Number of children: N/A  . Years of education: N/A   Occupational History  . Not on file.   Social History Main Topics  . Smoking status: Current Every Day Smoker    Packs/day: 1.00    Years: 30.00    Types: Cigarettes  . Smokeless tobacco: Never Used  . Alcohol use No  . Drug use: No  . Sexual activity: Not Currently   Other Topics Concern  . Not on file   Social History Narrative  . No narrative on file     Current Outpatient Prescriptions:  .  albuterol (PROVENTIL HFA;VENTOLIN HFA) 108 (90 Base) MCG/ACT  inhaler, Inhale 2 puffs into the lungs every 6 (six) hours as needed for wheezing or shortness of breath., Disp: 1 Inhaler, Rfl: 1 .  alprazolam (XANAX) 2 MG tablet, Take 1 tablet (2 mg total) by mouth 3 (three) times daily as needed for anxiety., Disp: 90 tablet, Rfl: 0 .  amLODipine (NORVASC) 10 MG tablet, Take 1 tablet (10 mg total) by mouth daily., Disp: 90 tablet, Rfl: 1 .  benzonatate (TESSALON) 200 MG capsule, Take 1 capsule (200 mg total) by mouth 2 (two) times daily as needed for cough., Disp: 20 capsule, Rfl: 0 .  carisoprodol (SOMA) 350 MG tablet, Take 1 tablet (350 mg total) by mouth 3 (three) times daily., Disp:  90 tablet, Rfl: 0 .  COMBIVENT RESPIMAT 20-100 MCG/ACT AERS respimat, INHALE 1 PUFF INTO THE LUNGS EVERY 6 HOURS AS NEEDED FOR WHEEZING OR SHORTNESS OF BREATH, Disp: 4 g, Rfl: 2 .  fluticasone furoate-vilanterol (BREO ELLIPTA) 100-25 MCG/INH AEPB, Inhale 1 puff into the lungs daily., Disp: 3 each, Rfl: 1 .  HYDROcodone-acetaminophen (NORCO) 10-325 MG tablet, Take 1 tablet by mouth every 6 (six) hours as needed., Disp: 120 tablet, Rfl: 0 .  ibuprofen (ADVIL,MOTRIN) 200 MG tablet, Take 200 mg by mouth every 6 (six) hours as needed for mild pain. , Disp: , Rfl:  .  meloxicam (MOBIC) 15 MG tablet, Take 1 tablet (15 mg total) by mouth daily., Disp: 30 tablet, Rfl: 0 .  mometasone (NASONEX) 50 MCG/ACT nasal spray, Place 2 sprays into the nose daily., Disp: 17 g, Rfl: 1 .  pantoprazole (PROTONIX) 40 MG tablet, TAKE 1 TABLET BY MOUTH ONCE A DAY, Disp: 30 tablet, Rfl: 0 .  potassium chloride (K-DUR) 10 MEQ tablet, TAKE 1 TABLET BY MOUTH ONCE A DAY, Disp: 90 tablet, Rfl: 1 .  potassium chloride (K-DUR,KLOR-CON) 10 MEQ tablet, Take 1 tablet (10 mEq total) by mouth daily., Disp: 90 tablet, Rfl: 0 .  predniSONE (STERAPRED UNI-PAK 21 TAB) 10 MG (21) TBPK tablet, 60 50 40 30 20 10  then STOP, Disp: 21 tablet, Rfl: 0 .  pregabalin (LYRICA) 75 MG capsule, Take 1 capsule (75 mg total) by mouth 3 (three) times daily., Disp: 90 capsule, Rfl: 0 .  QUEtiapine (SEROQUEL) 100 MG tablet, Take 1 tablet (100 mg total) by mouth at bedtime., Disp: 90 tablet, Rfl: 1 .  venlafaxine XR (EFFEXOR-XR) 75 MG 24 hr capsule, Take 1 capsule (75 mg total) by mouth daily with breakfast., Disp: 90 capsule, Rfl: 0  Allergies  Allergen Reactions  . Penicillins Rash and Other (See Comments)    .penallergy .penallergy     Review of Systems  Constitutional: Positive for irritability. Negative for fever.  HENT: Positive for congestion and sinus pressure. Negative for sore throat.   Respiratory: Positive for cough and shortness of breath.    Gastrointestinal: Negative for bowel incontinence.  Genitourinary: Negative for bladder incontinence.  Musculoskeletal: Positive for back pain.  Neurological: Positive for tingling and numbness (numbness, burning, and aching  in feet, peripheral neuropathy.). Negative for dizziness.  Psychiatric/Behavioral: Positive for sleep disturbance. Negative for suicidal ideas. The patient is nervous/anxious and has insomnia.      Objective  Vitals:   07/28/16 1038  BP: 118/74  Pulse: 80  Resp: 16  Temp: (!) 97.5 F (36.4 C)  TempSrc: Oral  SpO2: 96%  Weight: 129 lb 11.2 oz (58.8 kg)  Height: 5\' 8"  (1.727 m)    Physical Exam  Constitutional: She is oriented to person, place, and  time and well-developed, well-nourished, and in no distress.  HENT:  Head: Normocephalic and atraumatic.  Nose: Right sinus exhibits maxillary sinus tenderness and frontal sinus tenderness. Left sinus exhibits maxillary sinus tenderness and frontal sinus tenderness.  Mouth/Throat: Posterior oropharyngeal erythema present.  Nasal mucosal inflammation, turbinate hypertrophy.  Cardiovascular: Normal rate, regular rhythm and normal heart sounds.   No murmur heard. Pulmonary/Chest: Effort normal and breath sounds normal. She has no wheezes.  Musculoskeletal: She exhibits no edema.       Lumbar back: She exhibits tenderness, pain and spasm.       Back:  Neurological: She is alert and oriented to person, place, and time.  Psychiatric: Memory and judgment normal. Her mood appears anxious. She has a flat affect.  Nursing note and vitals reviewed.    Assessment & Plan  1. Chronic radicular low back pain Stable and responsive to opioid therapy, muscle relaxants for associated muscle spasm, patient compliant with controlled substances agreement, understands the dependence potential side effects and drug interactions of opioids. Refills provided follow-up in one month - HYDROcodone-acetaminophen (NORCO) 10-325 MG  tablet; Take 1 tablet by mouth every 6 (six) hours as needed.  Dispense: 120 tablet; Refill: 0 - carisoprodol (SOMA) 350 MG tablet; Take 1 tablet (350 mg total) by mouth 3 (three) times daily.  Dispense: 90 tablet; Refill: 0  2. Insomnia due to other mental disorder  - QUEtiapine (SEROQUEL) 100 MG tablet; Take 1 tablet (100 mg total) by mouth at bedtime.  Dispense: 90 tablet; Refill: 1  3. Panic disorder with agoraphobia and severe panic attacks Taking alprazolam as prescribed, compliant with controlled substances agreement, understands the dependence potential associated with benzodiazepines and their interaction with opioids, refills provided - alprazolam (XANAX) 2 MG tablet; Take 1 tablet (2 mg total) by mouth 3 (three) times daily as needed for anxiety.  Dispense: 90 tablet; Refill: 0  4. Peripheral sensory neuropathy  - pregabalin (LYRICA) 75 MG capsule; Take 1 capsule (75 mg total) by mouth 3 (three) times daily.  Dispense: 90 capsule; Refill: 0  5. Acute recurrent maxillary sinusitis  - clindamycin (CLEOCIN) 300 MG capsule; Take 1 capsule (300 mg total) by mouth 4 (four) times daily.  Dispense: 28 capsule; Refill: 0  Yaneisy Wenz [facility] 07/28/2016 10:48 AM

## 2016-08-25 ENCOUNTER — Ambulatory Visit: Payer: Medicare HMO | Admitting: Family Medicine

## 2016-08-26 ENCOUNTER — Ambulatory Visit (INDEPENDENT_AMBULATORY_CARE_PROVIDER_SITE_OTHER): Payer: Medicare HMO | Admitting: Family Medicine

## 2016-08-26 ENCOUNTER — Encounter: Payer: Self-pay | Admitting: Family Medicine

## 2016-08-26 VITALS — BP 120/66 | HR 88 | Temp 97.5°F | Resp 16 | Ht 68.0 in | Wt 128.9 lb

## 2016-08-26 DIAGNOSIS — G608 Other hereditary and idiopathic neuropathies: Secondary | ICD-10-CM

## 2016-08-26 DIAGNOSIS — M5416 Radiculopathy, lumbar region: Secondary | ICD-10-CM

## 2016-08-26 DIAGNOSIS — G8929 Other chronic pain: Secondary | ICD-10-CM | POA: Diagnosis not present

## 2016-08-26 DIAGNOSIS — F4001 Agoraphobia with panic disorder: Secondary | ICD-10-CM | POA: Diagnosis not present

## 2016-08-26 DIAGNOSIS — G629 Polyneuropathy, unspecified: Secondary | ICD-10-CM | POA: Diagnosis not present

## 2016-08-26 DIAGNOSIS — R0982 Postnasal drip: Secondary | ICD-10-CM | POA: Diagnosis not present

## 2016-08-26 MED ORDER — GUAIFENESIN ER 1200 MG PO TB12: 1.0000 | ORAL_TABLET | Freq: Two times a day (BID) | ORAL | 0 refills | Status: AC | PRN

## 2016-08-26 MED ORDER — PREGABALIN 75 MG PO CAPS: 75.0000 mg | ORAL_CAPSULE | Freq: Three times a day (TID) | ORAL | 0 refills | Status: DC

## 2016-08-26 MED ORDER — HYDROCODONE-ACETAMINOPHEN 10-325 MG PO TABS: 1.0000 | ORAL_TABLET | Freq: Four times a day (QID) | ORAL | 0 refills | Status: DC | PRN

## 2016-08-26 MED ORDER — ALPRAZOLAM 2 MG PO TABS: 2.0000 mg | ORAL_TABLET | Freq: Three times a day (TID) | ORAL | 0 refills | Status: DC | PRN

## 2016-08-26 MED ORDER — CARISOPRODOL 350 MG PO TABS: 350.0000 mg | ORAL_TABLET | Freq: Three times a day (TID) | ORAL | 0 refills | Status: DC

## 2016-08-26 NOTE — Progress Notes (Signed)
Name: Tamara Mcclure   MRN: [id number]    DOB: 25-Apr-1955   Date:08/26/2016       Progress Note  Subjective  Chief Complaint  Chief Complaint  Patient presents with  . Follow-up    1 mo  . Medication Refill    Anxiety  Presents for follow-up visit. The problem has been gradually worsening. Symptoms include depressed mood, excessive worry, insomnia, irritability, nervous/anxious behavior and panic. Patient reports no dizziness or suicidal ideas. The severity of symptoms is causing significant distress and severe. The quality of sleep is poor.   Her past medical history is significant for anxiety/panic attacks. Past treatments include benzodiazephines. The treatment provided moderate relief. Compliance with prior treatments has been good.  Back Pain  This is a chronic problem. The problem is unchanged. The pain is present in the lumbar spine. The pain does not radiate. The pain is at a severity of 5/10 (worse with associated muscle spasm). The pain is moderate. The pain is the same all the time. The symptoms are aggravated by bending and position. Associated symptoms include numbness (numbness, burning, and aching  in feet, peripheral neuropathy.) and tingling. Pertinent negatives include no bladder incontinence, bowel incontinence or fever. She has tried analgesics and muscle relaxant (Lyrica and Hydrocodone for low back pain and  tingling in feet.) for the symptoms.   Post Nasal Drainage: Patient has recurrent drainage and congestion especially in AM, sinuses stay 'stopped up', she coughs up yellow mucus in AM, no fevers, chills, dyspnea at baseline, she has tried guaifenesin with significant relief, wishing to receive a prescription for guaifenesin to help offset the OTC cost.    Past Medical History:  Diagnosis Date  . Anxiety   . Chronic lower back pain   . COPD (chronic obstructive pulmonary disease) ([place])   . Depression   . Hypertension   . Panic disorder with agoraphobia and  severe panic attacks   . Peripheral sensory neuropathy     Past Surgical History:  Procedure Laterality Date  . none      Family History  Problem Relation Age of Onset  . Cancer Mother        Lung  . Cancer Father        pancreatic  . Breast cancer Neg Hx   . Ovarian cancer Neg Hx   . Colon cancer Neg Hx   . Diabetes Neg Hx   . Heart disease Neg Hx     Social History   Social History  . Marital status: Single    Spouse name: N/A  . Number of children: N/A  . Years of education: N/A   Occupational History  . Not on file.   Social History Main Topics  . Smoking status: Current Every Day Smoker    Packs/day: 1.00    Years: 30.00    Types: Cigarettes  . Smokeless tobacco: Never Used  . Alcohol use No  . Drug use: No  . Sexual activity: Not Currently   Other Topics Concern  . Not on file   Social History Narrative  . No narrative on file     Current Outpatient Prescriptions:  .  albuterol (PROVENTIL HFA;VENTOLIN HFA) 108 (90 Base) MCG/ACT inhaler, Inhale 2 puffs into the lungs every 6 (six) hours as needed for wheezing or shortness of breath., Disp: 1 Inhaler, Rfl: 1 .  alprazolam (XANAX) 2 MG tablet, Take 1 tablet (2 mg total) by mouth 3 (three) times daily as needed for anxiety., Disp:  90 tablet, Rfl: 0 .  amLODipine (NORVASC) 10 MG tablet, Take 1 tablet (10 mg total) by mouth daily., Disp: 90 tablet, Rfl: 1 .  carisoprodol (SOMA) 350 MG tablet, Take 1 tablet (350 mg total) by mouth 3 (three) times daily., Disp: 90 tablet, Rfl: 0 .  COMBIVENT RESPIMAT 20-100 MCG/ACT AERS respimat, INHALE 1 PUFF INTO THE LUNGS EVERY 6 HOURS AS NEEDED FOR WHEEZING OR SHORTNESS OF BREATH, Disp: 4 g, Rfl: 2 .  fluticasone furoate-vilanterol (BREO ELLIPTA) 100-25 MCG/INH AEPB, Inhale 1 puff into the lungs daily., Disp: 3 each, Rfl: 1 .  HYDROcodone-acetaminophen (NORCO) 10-325 MG tablet, Take 1 tablet by mouth every 6 (six) hours as needed., Disp: 120 tablet, Rfl: 0 .  ibuprofen  (ADVIL,MOTRIN) 200 MG tablet, Take 200 mg by mouth every 6 (six) hours as needed for mild pain. , Disp: , Rfl:  .  meloxicam (MOBIC) 15 MG tablet, Take 1 tablet (15 mg total) by mouth daily., Disp: 30 tablet, Rfl: 0 .  mometasone (NASONEX) 50 MCG/ACT nasal spray, Place 2 sprays into the nose daily., Disp: 17 g, Rfl: 1 .  pantoprazole (PROTONIX) 40 MG tablet, TAKE 1 TABLET BY MOUTH ONCE A DAY, Disp: 30 tablet, Rfl: 0 .  potassium chloride (K-DUR) 10 MEQ tablet, TAKE 1 TABLET BY MOUTH ONCE A DAY, Disp: 90 tablet, Rfl: 1 .  potassium chloride (K-DUR,KLOR-CON) 10 MEQ tablet, Take 1 tablet (10 mEq total) by mouth daily., Disp: 90 tablet, Rfl: 0 .  predniSONE (STERAPRED UNI-PAK 21 TAB) 10 MG (21) TBPK tablet, 60 50 40 30 20 10  then STOP, Disp: 21 tablet, Rfl: 0 .  pregabalin (LYRICA) 75 MG capsule, Take 1 capsule (75 mg total) by mouth 3 (three) times daily., Disp: 90 capsule, Rfl: 0 .  QUEtiapine (SEROQUEL) 100 MG tablet, Take 1 tablet (100 mg total) by mouth at bedtime., Disp: 90 tablet, Rfl: 1  Allergies  Allergen Reactions  . Penicillins Rash and Other (See Comments)    .penallergy .penallergy     Review of Systems  Constitutional: Positive for irritability. Negative for fever.  Gastrointestinal: Negative for bowel incontinence.  Genitourinary: Negative for bladder incontinence.  Musculoskeletal: Positive for back pain.  Neurological: Positive for tingling and numbness (numbness, burning, and aching  in feet, peripheral neuropathy.). Negative for dizziness.  Psychiatric/Behavioral: Negative for suicidal ideas. The patient is nervous/anxious and has insomnia.       Objective  Vitals:   08/26/16 1016  BP: 120/66  Pulse: 88  Resp: 16  Temp: (!) 97.5 F (36.4 C)  TempSrc: Oral  SpO2: 96%  Weight: 128 lb 14.4 oz (58.5 kg)  Height: 5\' 8"  (1.727 m)    Physical Exam  Constitutional: She is well-developed, well-nourished, and in no distress.  HENT:  Head: Normocephalic and  atraumatic.  Nose: Right sinus exhibits no maxillary sinus tenderness. Left sinus exhibits maxillary sinus tenderness.  Mouth/Throat: Posterior oropharyngeal erythema present.  Mild mucosal hyperemia  Cardiovascular: Normal rate, regular rhythm, S1 normal, S2 normal and normal heart sounds.   No murmur heard. Pulmonary/Chest: Breath sounds normal. She has no wheezes.  Musculoskeletal:       Lumbar back: She exhibits tenderness, pain and spasm.       Back:  Nursing note and vitals reviewed.     Assessment & Plan  1. Chronic radicular low back pain Stable and responsive to opioid therapy and Soma, patient compliant with controlled substances agreement. Refills provided - carisoprodol (SOMA) 350 MG tablet; Take 1 tablet (  350 mg total) by mouth 3 (three) times daily.  Dispense: 90 tablet; Refill: 0 - HYDROcodone-acetaminophen (NORCO) 10-325 MG tablet; Take 1 tablet by mouth every 6 (six) hours as needed.  Dispense: 120 tablet; Refill: 0  2. Panic disorder with agoraphobia and severe panic attacks Continue on alprazolam, symptoms of panic disorder are responsive to benzodiazepine use. Patient compliant with controlled substances agreement - alprazolam (XANAX) 2 MG tablet; Take 1 tablet (2 mg total) by mouth 3 (three) times daily as needed for anxiety.  Dispense: 90 tablet; Refill: 0  3. Peripheral sensory neuropathy  - pregabalin (LYRICA) 75 MG capsule; Take 1 capsule (75 mg total) by mouth 3 (three) times daily.  Dispense: 90 capsule; Refill: 0  4. Post-nasal drainage Advised to take guaifenesin twice daily with a full glass of water, prescription provided to the patient - Guaifenesin 1200 MG TB12; Take 1 tablet (1,200 mg total) by mouth every 12 (twelve) hours as needed.  Dispense: 60 each; Refill: 0    Deserai Cansler [facility] 08/26/2016 10:23 AM

## 2016-09-22 ENCOUNTER — Ambulatory Visit (INDEPENDENT_AMBULATORY_CARE_PROVIDER_SITE_OTHER): Payer: Medicare HMO | Admitting: Family Medicine

## 2016-09-22 ENCOUNTER — Encounter: Payer: Self-pay | Admitting: Family Medicine

## 2016-09-22 DIAGNOSIS — G8929 Other chronic pain: Secondary | ICD-10-CM | POA: Diagnosis not present

## 2016-09-22 DIAGNOSIS — I10 Essential (primary) hypertension: Secondary | ICD-10-CM | POA: Diagnosis not present

## 2016-09-22 DIAGNOSIS — M5416 Radiculopathy, lumbar region: Secondary | ICD-10-CM

## 2016-09-22 DIAGNOSIS — G608 Other hereditary and idiopathic neuropathies: Secondary | ICD-10-CM

## 2016-09-22 DIAGNOSIS — F4001 Agoraphobia with panic disorder: Secondary | ICD-10-CM

## 2016-09-22 DIAGNOSIS — G629 Polyneuropathy, unspecified: Secondary | ICD-10-CM

## 2016-09-22 MED ORDER — HYDROCODONE-ACETAMINOPHEN 10-325 MG PO TABS: 1.0000 | ORAL_TABLET | Freq: Four times a day (QID) | ORAL | 0 refills | Status: DC | PRN

## 2016-09-22 MED ORDER — AMLODIPINE BESYLATE 10 MG PO TABS: 10.0000 mg | ORAL_TABLET | Freq: Every day | ORAL | 1 refills | Status: DC

## 2016-09-22 MED ORDER — CARISOPRODOL 350 MG PO TABS: 350.0000 mg | ORAL_TABLET | Freq: Three times a day (TID) | ORAL | 0 refills | Status: DC

## 2016-09-22 MED ORDER — ALPRAZOLAM 2 MG PO TABS: 2.0000 mg | ORAL_TABLET | Freq: Three times a day (TID) | ORAL | 0 refills | Status: DC | PRN

## 2016-09-22 MED ORDER — PREGABALIN 75 MG PO CAPS: 75.0000 mg | ORAL_CAPSULE | Freq: Three times a day (TID) | ORAL | 0 refills | Status: DC

## 2016-09-22 NOTE — Progress Notes (Signed)
Name: Tamara Mcclure   MRN: [id number]    DOB: 1955-10-26   Date:09/22/2016       Progress Note  Subjective  Chief Complaint  Chief Complaint  Patient presents with  . Follow-up    1 mo  . Hyperlipidemia  . Hypertension  . Medication Refill    Hypertension  This is a chronic problem. The problem is unchanged. The problem is controlled. Associated symptoms include anxiety. Pertinent negatives include no blurred vision, chest pain, headaches, palpitations or shortness of breath. Past treatments include calcium channel blockers.  Anxiety  Presents for follow-up visit. The problem has been gradually worsening. Symptoms include depressed mood, excessive worry, insomnia, irritability, nervous/anxious behavior and panic. Patient reports no chest pain, dizziness, palpitations, shortness of breath or suicidal ideas. Primary symptoms comment: worsening because hse is anxious about her physician (myself) leaving. The severity of symptoms is causing significant distress and severe. The quality of sleep is poor.   Her past medical history is significant for anxiety/panic attacks. Past treatments include benzodiazephines. The treatment provided moderate relief. Compliance with prior treatments has been good.  Back Pain  This is a chronic problem. The problem is unchanged. The pain is present in the lumbar spine. The pain does not radiate. The pain is at a severity of 6/10 (worse with associated muscle spasm). The pain is moderate. The pain is the same all the time. The symptoms are aggravated by bending and position. Associated symptoms include numbness (numbness, burning, and aching  in feet, peripheral neuropathy.) and tingling. Pertinent negatives include no bladder incontinence, bowel incontinence, chest pain, fever or headaches. She has tried analgesics and muscle relaxant (Lyrica and Hydrocodone for low back pain and  tingling in feet.) for the symptoms.     Past Medical History:  Diagnosis Date    . Anxiety   . Chronic lower back pain   . COPD (chronic obstructive pulmonary disease) ([place])   . Depression   . Hypertension   . Panic disorder with agoraphobia and severe panic attacks   . Peripheral sensory neuropathy     Past Surgical History:  Procedure Laterality Date  . none      Family History  Problem Relation Age of Onset  . Cancer Mother        Lung  . Cancer Father        pancreatic  . Breast cancer Neg Hx   . Ovarian cancer Neg Hx   . Colon cancer Neg Hx   . Diabetes Neg Hx   . Heart disease Neg Hx     Social History   Social History  . Marital status: Single    Spouse name: N/A  . Number of children: N/A  . Years of education: N/A   Occupational History  . Not on file.   Social History Main Topics  . Smoking status: Current Every Day Smoker    Packs/day: 1.00    Years: 30.00    Types: Cigarettes  . Smokeless tobacco: Never Used  . Alcohol use No  . Drug use: No  . Sexual activity: Not Currently   Other Topics Concern  . Not on file   Social History Narrative  . No narrative on file     Current Outpatient Prescriptions:  .  albuterol (PROVENTIL HFA;VENTOLIN HFA) 108 (90 Base) MCG/ACT inhaler, Inhale 2 puffs into the lungs every 6 (six) hours as needed for wheezing or shortness of breath., Disp: 1 Inhaler, Rfl: 1 .  alprazolam (XANAX) 2  MG tablet, Take 1 tablet (2 mg total) by mouth 3 (three) times daily as needed for anxiety., Disp: 90 tablet, Rfl: 0 .  amLODipine (NORVASC) 10 MG tablet, Take 1 tablet (10 mg total) by mouth daily., Disp: 90 tablet, Rfl: 1 .  carisoprodol (SOMA) 350 MG tablet, Take 1 tablet (350 mg total) by mouth 3 (three) times daily., Disp: 90 tablet, Rfl: 0 .  COMBIVENT RESPIMAT 20-100 MCG/ACT AERS respimat, INHALE 1 PUFF INTO THE LUNGS EVERY 6 HOURS AS NEEDED FOR WHEEZING OR SHORTNESS OF BREATH, Disp: 4 g, Rfl: 2 .  fluticasone furoate-vilanterol (BREO ELLIPTA) 100-25 MCG/INH AEPB, Inhale 1 puff into the lungs daily.,  Disp: 3 each, Rfl: 1 .  Guaifenesin 1200 MG TB12, Take 1 tablet (1,200 mg total) by mouth every 12 (twelve) hours as needed., Disp: 60 each, Rfl: 0 .  HYDROcodone-acetaminophen (NORCO) 10-325 MG tablet, Take 1 tablet by mouth every 6 (six) hours as needed., Disp: 120 tablet, Rfl: 0 .  ibuprofen (ADVIL,MOTRIN) 200 MG tablet, Take 200 mg by mouth every 6 (six) hours as needed for mild pain. , Disp: , Rfl:  .  meloxicam (MOBIC) 15 MG tablet, Take 1 tablet (15 mg total) by mouth daily., Disp: 30 tablet, Rfl: 0 .  mometasone (NASONEX) 50 MCG/ACT nasal spray, Place 2 sprays into the nose daily., Disp: 17 g, Rfl: 1 .  pantoprazole (PROTONIX) 40 MG tablet, TAKE 1 TABLET BY MOUTH ONCE A DAY, Disp: 30 tablet, Rfl: 0 .  potassium chloride (K-DUR) 10 MEQ tablet, TAKE 1 TABLET BY MOUTH ONCE A DAY, Disp: 90 tablet, Rfl: 1 .  potassium chloride (K-DUR,KLOR-CON) 10 MEQ tablet, Take 1 tablet (10 mEq total) by mouth daily., Disp: 90 tablet, Rfl: 0 .  predniSONE (STERAPRED UNI-PAK 21 TAB) 10 MG (21) TBPK tablet, 60 50 40 30 20 10  then STOP, Disp: 21 tablet, Rfl: 0 .  pregabalin (LYRICA) 75 MG capsule, Take 1 capsule (75 mg total) by mouth 3 (three) times daily., Disp: 90 capsule, Rfl: 0 .  QUEtiapine (SEROQUEL) 100 MG tablet, Take 1 tablet (100 mg total) by mouth at bedtime., Disp: 90 tablet, Rfl: 1  Allergies  Allergen Reactions  . Penicillins Rash and Other (See Comments)    .penallergy .penallergy     Review of Systems  Constitutional: Positive for irritability. Negative for fever.  Eyes: Negative for blurred vision.  Respiratory: Negative for shortness of breath.   Cardiovascular: Negative for chest pain and palpitations.  Gastrointestinal: Negative for bowel incontinence.  Genitourinary: Negative for bladder incontinence.  Musculoskeletal: Positive for back pain.  Neurological: Positive for tingling and numbness (numbness, burning, and aching  in feet, peripheral neuropathy.). Negative for dizziness  and headaches.  Psychiatric/Behavioral: Negative for suicidal ideas. The patient is nervous/anxious and has insomnia.     Objective  Vitals:   09/22/16 1141  BP: 122/68  Pulse: 90  Resp: 16  Temp: (!) 97.5 F (36.4 C)  TempSrc: Oral  SpO2: 93%  Weight: 127 lb 1.6 oz (57.7 kg)  Height: 5\' 8"  (1.727 m)    Physical Exam  Constitutional: She is oriented to person, place, and time and well-developed, well-nourished, and in no distress.  Cardiovascular: Normal rate, regular rhythm and normal heart sounds.   Pulmonary/Chest: Effort normal and breath sounds normal. She has no wheezes.  Musculoskeletal:       Lumbar back: She exhibits tenderness, pain and spasm.       Back:  Neurological: She is alert and oriented to  person, place, and time.  Psychiatric: Mood, memory, affect and judgment normal.  Nursing note and vitals reviewed.      Assessment & Plan  1. Chronic radicular low back pain Stable, responsive to opioid treatment, compliant with controlled substances agreement, refills provide - carisoprodol (SOMA) 350 MG tablet; Take 1 tablet (350 mg total) by mouth 3 (three) times daily.  Dispense: 90 tablet; Refill: 0 - HYDROcodone-acetaminophen (NORCO) 10-325 MG tablet; Take 1 tablet by mouth every 6 (six) hours as needed.  Dispense: 120 tablet; Refill: 0  2. Essential hypertension BP stable on present antihypertensive treatment - amLODipine (NORVASC) 10 MG tablet; Take 1 tablet (10 mg total) by mouth daily.  Dispense: 90 tablet; Refill: 1  3. Panic disorder with agoraphobia and severe panic attacks Symptoms of anxiety are marginally worse, continue on alprazolam as prescribed, patient compliant with controlled substances agreement, understands the dependence potential, side effects and drug interactions of opioids with benzodiazepines. Refills provided - alprazolam (XANAX) 2 MG tablet; Take 1 tablet (2 mg total) by mouth 3 (three) times daily as needed for anxiety.  Dispense:  90 tablet; Refill: 0  4. Peripheral sensory neuropathy  - pregabalin (LYRICA) 75 MG capsule; Take 1 capsule (75 mg total) by mouth 3 (three) times daily.  Dispense: 90 capsule; Refill: 0  Melford Tullier [facility] 09/22/2016 11:47 AM

## 2016-09-26 ENCOUNTER — Ambulatory Visit: Payer: Medicare HMO | Admitting: Family Medicine

## 2016-10-03 ENCOUNTER — Other Ambulatory Visit: Payer: Self-pay | Admitting: Family Medicine

## 2016-10-03 DIAGNOSIS — E876 Hypokalemia: Secondary | ICD-10-CM

## 2016-10-05 ENCOUNTER — Other Ambulatory Visit: Payer: Self-pay | Admitting: Family Medicine

## 2016-10-05 DIAGNOSIS — E876 Hypokalemia: Secondary | ICD-10-CM

## 2016-10-21 ENCOUNTER — Ambulatory Visit (INDEPENDENT_AMBULATORY_CARE_PROVIDER_SITE_OTHER): Payer: Medicare HMO | Admitting: Family Medicine

## 2016-10-21 ENCOUNTER — Encounter: Payer: Self-pay | Admitting: Family Medicine

## 2016-10-21 VITALS — BP 110/70 | HR 100 | Temp 98.2°F | Resp 16 | Ht 68.0 in | Wt 126.6 lb

## 2016-10-21 DIAGNOSIS — G8929 Other chronic pain: Secondary | ICD-10-CM | POA: Diagnosis not present

## 2016-10-21 DIAGNOSIS — M5416 Radiculopathy, lumbar region: Secondary | ICD-10-CM

## 2016-10-21 DIAGNOSIS — H9212 Otorrhea, left ear: Secondary | ICD-10-CM

## 2016-10-21 DIAGNOSIS — G629 Polyneuropathy, unspecified: Secondary | ICD-10-CM | POA: Diagnosis not present

## 2016-10-21 DIAGNOSIS — G608 Other hereditary and idiopathic neuropathies: Secondary | ICD-10-CM

## 2016-10-21 DIAGNOSIS — F4001 Agoraphobia with panic disorder: Secondary | ICD-10-CM

## 2016-10-21 MED ORDER — MOMETASONE FUROATE 50 MCG/ACT NA SUSP: 2.0000 | Freq: Every day | NASAL | 0 refills | Status: DC

## 2016-10-21 MED ORDER — HYDROCODONE-ACETAMINOPHEN 10-325 MG PO TABS: 1.0000 | ORAL_TABLET | Freq: Four times a day (QID) | ORAL | 0 refills | Status: DC | PRN

## 2016-10-21 MED ORDER — CARISOPRODOL 350 MG PO TABS: 350.0000 mg | ORAL_TABLET | Freq: Three times a day (TID) | ORAL | 0 refills | Status: DC

## 2016-10-21 MED ORDER — ALPRAZOLAM 2 MG PO TABS: 2.0000 mg | ORAL_TABLET | Freq: Three times a day (TID) | ORAL | 0 refills | Status: DC | PRN

## 2016-10-21 MED ORDER — PREGABALIN 75 MG PO CAPS: 75.0000 mg | ORAL_CAPSULE | Freq: Three times a day (TID) | ORAL | 0 refills | Status: DC

## 2016-10-21 NOTE — Progress Notes (Signed)
Name: Tamara Mcclure   MRN: [id number]    DOB: 1955-10-03   Date:10/21/2016       Progress Note  Subjective  Chief Complaint  Chief Complaint  Patient presents with  . Medication Refill    Anxiety  Presents for follow-up visit. The problem has been gradually worsening. Symptoms include depressed mood, excessive worry, insomnia, irritability, nervous/anxious behavior and panic. Patient reports no dizziness or suicidal ideas. The severity of symptoms is causing significant distress and severe. The quality of sleep is poor.   Her past medical history is significant for anxiety/panic attacks. Past treatments include benzodiazephines. The treatment provided moderate relief. Compliance with prior treatments has been good.  Back Pain  This is a chronic problem. The problem is unchanged. The pain is present in the lumbar spine. The pain does not radiate. The pain is at a severity of 5/10 (worse with associated muscle spasm, has been picking up tree limbs from her yard). The pain is moderate. The pain is the same all the time. The symptoms are aggravated by bending and position. Associated symptoms include numbness (numbness, burning, and aching  in feet, peripheral neuropathy.) and tingling. Pertinent negatives include no bladder incontinence, bowel incontinence or fever. She has tried analgesics and muscle relaxant (Lyrica and Hydrocodone for low back pain and  tingling in feet.) for the symptoms.   Patient would also like to have her ears examined. She has headaches, nasal congestion, ears are 'popping like crazy' She has tried Sudafed on the advice of the pharmacist, which seems to help some.    Past Medical History:  Diagnosis Date  . Anxiety   . Chronic lower back pain   . COPD (chronic obstructive pulmonary disease) ([place])   . Depression   . Hypertension   . Panic disorder with agoraphobia and severe panic attacks   . Peripheral sensory neuropathy     Past Surgical History:  Procedure  Laterality Date  . none      Family History  Problem Relation Age of Onset  . Cancer Mother        Lung  . Cancer Father        pancreatic  . Breast cancer Neg Hx   . Ovarian cancer Neg Hx   . Colon cancer Neg Hx   . Diabetes Neg Hx   . Heart disease Neg Hx     Social History   Social History  . Marital status: Single    Spouse name: N/A  . Number of children: N/A  . Years of education: N/A   Occupational History  . Not on file.   Social History Main Topics  . Smoking status: Current Every Day Smoker    Packs/day: 1.00    Years: 30.00    Types: Cigarettes  . Smokeless tobacco: Never Used  . Alcohol use No  . Drug use: No  . Sexual activity: Not Currently   Other Topics Concern  . Not on file   Social History Narrative  . No narrative on file     Current Outpatient Prescriptions:  .  albuterol (PROVENTIL HFA;VENTOLIN HFA) 108 (90 Base) MCG/ACT inhaler, Inhale 2 puffs into the lungs every 6 (six) hours as needed for wheezing or shortness of breath., Disp: 1 Inhaler, Rfl: 1 .  alprazolam (XANAX) 2 MG tablet, Take 1 tablet (2 mg total) by mouth 3 (three) times daily as needed for anxiety., Disp: 90 tablet, Rfl: 0 .  amLODipine (NORVASC) 10 MG tablet, Take 1 tablet (  10 mg total) by mouth daily., Disp: 90 tablet, Rfl: 1 .  carisoprodol (SOMA) 350 MG tablet, Take 1 tablet (350 mg total) by mouth 3 (three) times daily., Disp: 90 tablet, Rfl: 0 .  COMBIVENT RESPIMAT 20-100 MCG/ACT AERS respimat, INHALE 1 PUFF INTO THE LUNGS EVERY 6 HOURS AS NEEDED FOR WHEEZING OR SHORTNESS OF BREATH, Disp: 4 g, Rfl: 2 .  fluticasone furoate-vilanterol (BREO ELLIPTA) 100-25 MCG/INH AEPB, Inhale 1 puff into the lungs daily., Disp: 3 each, Rfl: 1 .  HYDROcodone-acetaminophen (NORCO) 10-325 MG tablet, Take 1 tablet by mouth every 6 (six) hours as needed., Disp: 120 tablet, Rfl: 0 .  ibuprofen (ADVIL,MOTRIN) 200 MG tablet, Take 200 mg by mouth every 6 (six) hours as needed for mild pain. ,  Disp: , Rfl:  .  meloxicam (MOBIC) 15 MG tablet, Take 1 tablet (15 mg total) by mouth daily., Disp: 30 tablet, Rfl: 0 .  mometasone (NASONEX) 50 MCG/ACT nasal spray, Place 2 sprays into the nose daily., Disp: 17 g, Rfl: 1 .  pantoprazole (PROTONIX) 40 MG tablet, TAKE 1 TABLET BY MOUTH ONCE A DAY, Disp: 30 tablet, Rfl: 0 .  potassium chloride (K-DUR) 10 MEQ tablet, TAKE 1 TABLET BY MOUTH ONCE A DAY, Disp: 90 tablet, Rfl: 1 .  potassium chloride (K-DUR) 10 MEQ tablet, TAKE 1 TABLET BY MOUTH ONCE DAILY, Disp: 90 tablet, Rfl: 0 .  pregabalin (LYRICA) 75 MG capsule, Take 1 capsule (75 mg total) by mouth 3 (three) times daily., Disp: 90 capsule, Rfl: 0 .  QUEtiapine (SEROQUEL) 100 MG tablet, Take 1 tablet (100 mg total) by mouth at bedtime., Disp: 90 tablet, Rfl: 1 .  predniSONE (STERAPRED UNI-PAK 21 TAB) 10 MG (21) TBPK tablet, 60 50 40 30 20 10  then STOP (Patient not taking: Reported on 10/21/2016), Disp: 21 tablet, Rfl: 0  Allergies  Allergen Reactions  . Penicillins Rash and Other (See Comments)    .penallergy .penallergy     Review of Systems  Constitutional: Positive for irritability. Negative for fever.  Gastrointestinal: Negative for bowel incontinence.  Genitourinary: Negative for bladder incontinence.  Musculoskeletal: Positive for back pain.  Neurological: Positive for tingling and numbness (numbness, burning, and aching  in feet, peripheral neuropathy.). Negative for dizziness.  Psychiatric/Behavioral: Negative for suicidal ideas. The patient is nervous/anxious and has insomnia.       Objective  Vitals:   10/21/16 1113  BP: 110/70  Pulse: 100  Resp: 16  Temp: 98.2 F (36.8 C)  TempSrc: Oral  SpO2: 94%  Weight: 126 lb 9.6 oz (57.4 kg)  Height: 5\' 8"  (1.727 m)    Physical Exam  Constitutional: She is oriented to person, place, and time and well-developed, well-nourished, and in no distress.  HENT:  Head: Normocephalic and atraumatic.  Right Ear: Tympanic membrane  and ear canal normal.  Left ear canal has cloudy discharge, TM not visible, painful otoscopic exam. Left nasal mucosal inflammation, turbinate hypertrophy  Cardiovascular: Normal rate, regular rhythm and normal heart sounds.   No murmur heard. Pulmonary/Chest: Effort normal and breath sounds normal. She has no wheezes.  Musculoskeletal:       Lumbar back: She exhibits tenderness, pain and spasm.       Back:  Neurological: She is alert and oriented to person, place, and time.  Psychiatric: Memory normal. Her mood appears anxious. She has a flat affect.  Nursing note and vitals reviewed.     Assessment & Plan  1. Chronic radicular low back pain Stable and  responsive to opioid treatment along with Soma, compliant with controlled substances agreement and understands the dependence potential, side effects and drug interactions of opioids with benzodiazepines. Refills provided - carisoprodol (SOMA) 350 MG tablet; Take 1 tablet (350 mg total) by mouth 3 (three) times daily.  Dispense: 90 tablet; Refill: 0 - HYDROcodone-acetaminophen (NORCO) 10-325 MG tablet; Take 1 tablet by mouth every 6 (six) hours as needed.  Dispense: 120 tablet; Refill: 0  2. Panic disorder with agoraphobia and severe panic attacks Persistent and distressing symptoms, continue on alprazolam as prescribed, patient compliant with controlled substances agreement and understands the dependence potential, side effects and drug interactions of benzodiazepines. Refills provided- alprazolam (XANAX) 2 MG tablet; Take 1 tablet (2 mg total) by mouth 3 (three) times daily as needed for anxiety.  Dispense: 90 tablet; Refill: 0  3. Peripheral sensory neuropathy  - pregabalin (LYRICA) 75 MG capsule; Take 1 capsule (75 mg total) by mouth 3 (three) times daily.  Dispense: 90 capsule; Refill: 0  4. Ear discharge, left Likely because of postnasal drainage, started on Nasonex, if no improvement, she will call in for antibiotics -  mometasone (NASONEX) 50 MCG/ACT nasal spray; Place 2 sprays into the nose daily.  Dispense: 17 g; Refill: 0   Adanna Zuckerman [facility] A[facility] 10/21/2016 11:27 AM

## 2016-10-24 ENCOUNTER — Other Ambulatory Visit: Payer: Self-pay

## 2016-10-25 ENCOUNTER — Telehealth: Payer: Self-pay | Admitting: Family Medicine

## 2016-10-25 NOTE — Telephone Encounter (Signed)
Pt states she was told if her ear does not feel better for her to call back and she can get a antibiotic. Pt is still having issues and is requesting a RX be sent to [facility].

## 2016-10-27 NOTE — Telephone Encounter (Signed)
Left a detail messaged asking the pt to call our office to see what she prefer to do.

## 2016-10-27 NOTE — Telephone Encounter (Signed)
Pt states her ear is feeling better but she states that you mention having a ear drops and her sinus is acting up as well. She states she taking something OTC but its not working. Pt states she is coughing, head stuffy, can't hardly breath due to being stuffy. Pt denies any fever, sneezing but some nasal congestion. Pt asking  something for  Sinus or cold. Pt denied ENT referral and seeing another provider

## 2016-10-27 NOTE — Telephone Encounter (Signed)
She has taken clindamycin the past which apparently works for sometime but then the symptoms recurred. She should be referred to ENT and/or a follow-up with any available provider.

## 2016-10-27 NOTE — Telephone Encounter (Signed)
She may be prescribed doxycycline for sinus infections. Please send a prescription for doxycycline 100 mg twice daily 5 days.

## 2016-10-28 ENCOUNTER — Other Ambulatory Visit: Payer: Self-pay

## 2016-10-28 MED ORDER — DOXYCYCLINE HYCLATE 100 MG PO TABS: 100.0000 mg | ORAL_TABLET | Freq: Two times a day (BID) | ORAL | 0 refills | Status: DC

## 2016-10-28 NOTE — Telephone Encounter (Signed)
Rx was sent to [facility] as instructed per Dr. Manuella Ghazi request. Left a messaged detailing the instruction and what was sent to her pharmacy

## 2016-10-31 ENCOUNTER — Telehealth: Payer: Self-pay | Admitting: Family Medicine

## 2016-10-31 DIAGNOSIS — R0982 Postnasal drip: Secondary | ICD-10-CM

## 2016-10-31 MED ORDER — AZELASTINE HCL 0.15 % NA SOLN: 1.0000 | Freq: Two times a day (BID) | NASAL | 0 refills | Status: DC

## 2016-10-31 NOTE — Telephone Encounter (Signed)
Pt states the nasal spray was not covered under her insurance, but they will cover Azelastine to be sent to [facility]

## 2016-10-31 NOTE — Telephone Encounter (Signed)
Prescription for Azelastine nasal spray is sent to patient's pharmacy

## 2016-11-03 ENCOUNTER — Ambulatory Visit: Payer: Self-pay

## 2016-11-03 NOTE — Telephone Encounter (Signed)
Pt just wants RX please route to provider Offered a an appointment pt refused Pt is  having severe pain in back that is shooting down the right leg  Reason for Disposition . [1] SEVERE back pain (e.g., excruciating, unable to do any normal activities) AND [2] not improved 2 hours after pain medicine  Answer Assessment - Initial Assessment Questions 1. ONSET: "When did the pain begin?"     10/29/16 2. LOCATION: "Where does it hurt?" (upper, mid or lower back)     Lower back  3. SEVERITY: "How bad is the pain?"  (e.g., Scale 1-10; mild, moderate, or severe)   - MILD (1-3): doesn't interfere with normal activities    - MODERATE (4-7): interferes with normal activities or awakens from sleep    - SEVERE (8-10): excruciating pain, unable to do any normal activities      10 in tears 4. PATTERN: "Is the pain constant?" (e.g., yes, no; constant, intermittent)      Intermittent worse with rest 5. RADIATION: "Does the pain shoot into your legs or elsewhere?"     Shoots down right leg 6. CAUSE:  "What do you think is causing the back pain?"      Has degenerative disc disease/ picking up kerosene container to refill her heater 7. BACK OVERUSE:  "Any recent lifting of heavy objects, strenuous work or exercise?"     Yes  8. MEDICATIONS: "What have you taken so far for the pain?" (e.g., nothing, acetaminophen, NSAIDS)     Muscle relaxer and hydrocodone and ibuprofen 9. NEUROLOGIC SYMPTOMS: "Do you have any weakness, numbness, or problems with bowel/bladder control?"     Yes leg weakness, has neuropathy so "they always feel numb to me" 10. OTHER SYMPTOMS: "Do you have any other symptoms?" (e.g., fever, abdominal pain, burning with urination, blood in urine)       No just head congestion r/t allergies  11. PREGNANCY: "Is there any chance you are pregnant?" (e.g., yes, no; LMP)       N/a  Protocols used: BACK PAIN-A-AH

## 2016-11-15 ENCOUNTER — Ambulatory Visit: Payer: Medicare HMO | Admitting: Family Medicine

## 2016-11-15 ENCOUNTER — Encounter: Payer: Self-pay | Admitting: Family Medicine

## 2016-11-15 DIAGNOSIS — F4001 Agoraphobia with panic disorder: Secondary | ICD-10-CM

## 2016-11-15 DIAGNOSIS — M5416 Radiculopathy, lumbar region: Secondary | ICD-10-CM

## 2016-11-15 DIAGNOSIS — G608 Other hereditary and idiopathic neuropathies: Secondary | ICD-10-CM

## 2016-11-15 DIAGNOSIS — G629 Polyneuropathy, unspecified: Secondary | ICD-10-CM

## 2016-11-15 DIAGNOSIS — G8929 Other chronic pain: Secondary | ICD-10-CM | POA: Diagnosis not present

## 2016-11-15 MED ORDER — PREGABALIN 75 MG PO CAPS: 75.0000 mg | ORAL_CAPSULE | Freq: Three times a day (TID) | ORAL | 0 refills | Status: DC

## 2016-11-15 MED ORDER — CARISOPRODOL 350 MG PO TABS: 350.0000 mg | ORAL_TABLET | Freq: Three times a day (TID) | ORAL | 0 refills | Status: DC

## 2016-11-15 MED ORDER — HYDROCODONE-ACETAMINOPHEN 10-325 MG PO TABS: 1.0000 | ORAL_TABLET | Freq: Four times a day (QID) | ORAL | 0 refills | Status: DC | PRN

## 2016-11-15 MED ORDER — ALPRAZOLAM 2 MG PO TABS: 2.0000 mg | ORAL_TABLET | Freq: Three times a day (TID) | ORAL | 0 refills | Status: DC | PRN

## 2016-11-15 NOTE — Progress Notes (Signed)
Name: Tamara Mcclure   MRN: [id number]    DOB: Jun 11, 1955   Date:11/15/2016       Progress Note  Subjective  Chief Complaint  Chief Complaint  Patient presents with  . Medication Refill    Anxiety  Presents for follow-up visit. The problem has been gradually worsening. Symptoms include depressed mood, excessive worry, insomnia, irritability, nervous/anxious behavior and panic. Patient reports no dizziness or suicidal ideas. The severity of symptoms is causing significant distress and severe. The quality of sleep is poor.   Her past medical history is significant for anxiety/panic attacks. Past treatments include benzodiazephines. The treatment provided moderate relief. Compliance with prior treatments has been good.  Back Pain  This is a chronic problem. The problem is unchanged. The pain is present in the lumbar spine. The pain does not radiate. The pain is at a severity of 7/10. The pain is moderate. The pain is the same all the time. The symptoms are aggravated by bending and position. Associated symptoms include numbness (numbness, burning, and aching  in feet, peripheral neuropathy.) and tingling. Pertinent negatives include no bladder incontinence, bowel incontinence or fever. She has tried analgesics and muscle relaxant (Lyrica and Hydrocodone for low back pain and  tingling in feet.) for the symptoms.      Past Medical History:  Diagnosis Date  . Anxiety   . Chronic lower back pain   . COPD (chronic obstructive pulmonary disease) ([place])   . Depression   . Hypertension   . Panic disorder with agoraphobia and severe panic attacks   . Peripheral sensory neuropathy     Past Surgical History:  Procedure Laterality Date  . none      Family History  Problem Relation Age of Onset  . Cancer Mother        Lung  . Cancer Father        pancreatic  . Breast cancer Neg Hx   . Ovarian cancer Neg Hx   . Colon cancer Neg Hx   . Diabetes Neg Hx   . Heart disease Neg Hx      Social History   Socioeconomic History  . Marital status: Single    Spouse name: Not on file  . Number of children: Not on file  . Years of education: Not on file  . Highest education level: Not on file  Social Needs  . Financial resource strain: Not on file  . Food insecurity - worry: Not on file  . Food insecurity - inability: Not on file  . Transportation needs - medical: Not on file  . Transportation needs - non-medical: Not on file  Occupational History  . Not on file  Tobacco Use  . Smoking status: Current Every Day Smoker    Packs/day: 1.00    Years: 30.00    Pack years: 30.00    Types: Cigarettes  . Smokeless tobacco: Never Used  Substance and Sexual Activity  . Alcohol use: No  . Drug use: No  . Sexual activity: Not Currently  Other Topics Concern  . Not on file  Social History Narrative  . Not on file     Current Outpatient Medications:  .  albuterol (PROVENTIL HFA;VENTOLIN HFA) 108 (90 Base) MCG/ACT inhaler, Inhale 2 puffs into the lungs every 6 (six) hours as needed for wheezing or shortness of breath., Disp: 1 Inhaler, Rfl: 1 .  alprazolam (XANAX) 2 MG tablet, Take 1 tablet (2 mg total) by mouth 3 (three) times daily as needed  for anxiety., Disp: 90 tablet, Rfl: 0 .  amLODipine (NORVASC) 10 MG tablet, Take 1 tablet (10 mg total) by mouth daily., Disp: 90 tablet, Rfl: 1 .  Azelastine HCl 0.15 % SOLN, Place 1 spray into the nose 2 (two) times daily., Disp: 1 mL, Rfl: 0 .  carisoprodol (SOMA) 350 MG tablet, Take 1 tablet (350 mg total) by mouth 3 (three) times daily., Disp: 90 tablet, Rfl: 0 .  COMBIVENT RESPIMAT 20-100 MCG/ACT AERS respimat, INHALE 1 PUFF INTO THE LUNGS EVERY 6 HOURS AS NEEDED FOR WHEEZING OR SHORTNESS OF BREATH, Disp: 4 g, Rfl: 2 .  doxycycline (VIBRA-TABS) 100 MG tablet, Take 1 tablet (100 mg total) by mouth 2 (two) times daily. Take 1 (100 mg tablet) by mouth 2 (two) times daily for 5 (five) days., Disp: 10 tablet, Rfl: 0 .  fluticasone  furoate-vilanterol (BREO ELLIPTA) 100-25 MCG/INH AEPB, Inhale 1 puff into the lungs daily., Disp: 3 each, Rfl: 1 .  HYDROcodone-acetaminophen (NORCO) 10-325 MG tablet, Take 1 tablet by mouth every 6 (six) hours as needed., Disp: 120 tablet, Rfl: 0 .  ibuprofen (ADVIL,MOTRIN) 200 MG tablet, Take 200 mg by mouth every 6 (six) hours as needed for mild pain. , Disp: , Rfl:  .  meloxicam (MOBIC) 15 MG tablet, Take 1 tablet (15 mg total) by mouth daily., Disp: 30 tablet, Rfl: 0 .  mometasone (NASONEX) 50 MCG/ACT nasal spray, Place 2 sprays into the nose daily., Disp: 17 g, Rfl: 0 .  pantoprazole (PROTONIX) 40 MG tablet, TAKE 1 TABLET BY MOUTH ONCE A DAY, Disp: 30 tablet, Rfl: 0 .  potassium chloride (K-DUR) 10 MEQ tablet, TAKE 1 TABLET BY MOUTH ONCE A DAY, Disp: 90 tablet, Rfl: 1 .  potassium chloride (K-DUR) 10 MEQ tablet, TAKE 1 TABLET BY MOUTH ONCE DAILY, Disp: 90 tablet, Rfl: 0 .  predniSONE (STERAPRED UNI-PAK 21 TAB) 10 MG (21) TBPK tablet, 60 50 40 30 20 10  then STOP (Patient not taking: Reported on 10/21/2016), Disp: 21 tablet, Rfl: 0 .  pregabalin (LYRICA) 75 MG capsule, Take 1 capsule (75 mg total) by mouth 3 (three) times daily., Disp: 90 capsule, Rfl: 0 .  QUEtiapine (SEROQUEL) 100 MG tablet, Take 1 tablet (100 mg total) by mouth at bedtime., Disp: 90 tablet, Rfl: 1  Allergies  Allergen Reactions  . Penicillins Rash and Other (See Comments)    .penallergy .penallergy     Review of Systems  Constitutional: Positive for irritability. Negative for fever.  Gastrointestinal: Negative for bowel incontinence.  Genitourinary: Negative for bladder incontinence.  Musculoskeletal: Positive for back pain.  Neurological: Positive for tingling and numbness (numbness, burning, and aching  in feet, peripheral neuropathy.). Negative for dizziness.  Psychiatric/Behavioral: Negative for suicidal ideas. The patient is nervous/anxious and has insomnia.     Objective  Vitals:   11/15/16 1151  BP:  112/74  Pulse: 95  Resp: 14  Temp: (!) 97.5 F (36.4 C)  TempSrc: Oral  SpO2: 98%  Weight: 127 lb 6.4 oz (57.8 kg)  Height: 5\' 8"  (1.727 m)    Physical Exam  Constitutional: She is oriented to person, place, and time and well-developed, well-nourished, and in no distress.  HENT:  Head: Normocephalic and atraumatic.  Cardiovascular: Normal rate, regular rhythm and normal heart sounds.  Pulmonary/Chest: Effort normal and breath sounds normal. She has no wheezes.  Musculoskeletal:       Lumbar back: She exhibits tenderness, pain and spasm.       Back:  Neurological:  She is alert and oriented to person, place, and time.  Psychiatric: Mood, memory, affect and judgment normal.  Nursing note and vitals reviewed.     Assessment & Plan  1. Chronic radicular low back pain Stable, pain is controlled on hydrocodone and Soma taken as prescribed, compliant with controlled substances agreement. Patient will like to see a new primary care physician after I leave her continued medication management  - carisoprodol (SOMA) 350 MG tablet; Take 1 tablet (350 mg total) 3 (three) times daily by mouth.  Dispense: 90 tablet; Refill: 0 - HYDROcodone-acetaminophen (NORCO) 10-325 MG tablet; Take 1 tablet every 6 (six) hours as needed by mouth.  Dispense: 120 tablet; Refill: 0  2. Panic disorder with agoraphobia and severe panic attacks  severe, symptoms responsive to alprazolam taken up to 3 times daily as needed, refills provided - alprazolam (XANAX) 2 MG tablet; Take 1 tablet (2 mg total) 3 (three) times daily as needed by mouth for anxiety.  Dispense: 90 tablet; Refill: 0  3. Peripheral sensory neuropathy  - pregabalin (LYRICA) 75 MG capsule; Take 1 capsule (75 mg total) 3 (three) times daily by mouth.  Dispense: 90 capsule; Refill: 0   Decklan Mau [facility] 11/15/2016 11:59 AM

## 2016-11-15 NOTE — Progress Notes (Signed)
The following letter was provided to Hipolito Bayley during their visit today: ---------------------------------------  Dear valued [facility] Patient,  I am writing to share that as of February 24, 2017, I will no longer be seeing patients at [facility]. While it has been my privilege to care for you as a physician, I have decided to move outside of [place] to pursue other opportunities.  The staff at [facility] has been supportive of my decision and are supportive of any patients who have been under my care.  They will be happy to provide care to you and your family.  The office staff will do everything they can to ensure a seamless transition of care at [facility].  However if you are on any controlled substance medications (i.e. Pain medication or benzodiazepines), they will no longer be able to refill those medications, but we will be more than happy to refer you to a specialist.  [facility] will also assist you with the transfer of medical records should you wish to seek care elsewhere.  If you have any questions about your future care, you may call the office at [phone].  I have enjoyed getting to know my patients here and I wish you the very best.  Sincerely,  Rochel Brome, MD  ---------------------------------------  A written copy of this letter was given to the patient.  The patient verbalizes understanding of the letter, and does request referral to specialist or to new primary care provider.  This decision has been conveyed to Dr. Manuella Ghazi, who will place any appropriate referrals during today's visit.

## 2016-11-17 ENCOUNTER — Telehealth: Payer: Self-pay | Admitting: Family Medicine

## 2016-11-17 NOTE — Telephone Encounter (Signed)
Copied from [place] #[id number]. Topic: Quick Communication - See Telephone Encounter >> Nov 17, 2016  3:48 PM Arletha Grippe wrote: CRM for notification. See Telephone encounter for:   11/17/16. Pt wants to know if dr Manuella Ghazi will be able to fill HYDROcodone-acetaminophen (NORCO) 10-325 MG tablet, pregabalin (LYRICA) 75 MG capsule, alprazolam (XANAX) 2 MG tablet, QUEtiapine (SEROQUEL) 100 MG tablet, carisoprodol (SOMA) 350 MG tablet. She does not need them until next month, just wants to make sure they can be refilled.   Cb number is [phone]

## 2016-11-18 ENCOUNTER — Ambulatory Visit: Payer: Medicare HMO | Admitting: Family Medicine

## 2016-11-23 ENCOUNTER — Other Ambulatory Visit: Payer: Self-pay | Admitting: Family Medicine

## 2016-11-23 DIAGNOSIS — M47816 Spondylosis without myelopathy or radiculopathy, lumbar region: Secondary | ICD-10-CM

## 2016-11-23 DIAGNOSIS — M5416 Radiculopathy, lumbar region: Secondary | ICD-10-CM

## 2016-11-23 DIAGNOSIS — G8929 Other chronic pain: Secondary | ICD-10-CM

## 2016-11-23 NOTE — Telephone Encounter (Signed)
Referral to pain clinic at [facility] is entered

## 2016-11-23 NOTE — Telephone Encounter (Signed)
Pt asked if she can have a referral to [facility] pain clinic please. I gave her some places to call regarding finding a new PCP just to find out if they can help. Pt states she do not want to be referred to a psychiatrist; they did not help, pt own words.

## 2016-11-23 NOTE — Telephone Encounter (Signed)
According to patient's last office visit note, she was interested in finding a new primary care physician who can continue to prescribe these medications for her after I leave. She should consult with a new PCP and establish care with him or her as soon as possible

## 2016-11-24 ENCOUNTER — Telehealth: Payer: Self-pay | Admitting: Family Medicine

## 2016-11-24 NOTE — Telephone Encounter (Signed)
Copied from [place] [id number]. Topic: Medical Record Request - Other >> Nov 24, 2016  8:36 AM Yvette Rack wrote: Patient Name/DOB/MRN #: Carin Primrose Requestor Name/Agency: self Call Back #: [phone] Information Requested: all medical records   Patient wood like for her records to be sent to the [facility] in [place] fax number [phone]   Route to [place] [facility] [place] [facility]. For all other clinics, route to the clinic's PEC Pool.

## 2016-12-08 ENCOUNTER — Telehealth: Payer: Self-pay | Admitting: Family Medicine

## 2016-12-08 DIAGNOSIS — F4001 Agoraphobia with panic disorder: Secondary | ICD-10-CM

## 2016-12-08 NOTE — Telephone Encounter (Signed)
Patient wanting to see if the office has received her medical records she requested to be sent back on 11/24/16? Also patient wants to know if the drs will be able to continue refilling her hydrocodone, xanax, soma, and lyrica? Please advise.

## 2016-12-08 NOTE — Telephone Encounter (Signed)
Pt is checking status on referral to psychologist she has not heard anything from them

## 2016-12-08 NOTE — Telephone Encounter (Signed)
Spoke with patient and informed her that we can not release her records without a signed medical release. Pt will fill it out on Tuesday when she comes in

## 2016-12-09 NOTE — Addendum Note (Signed)
Addended byManuella Ghazi, Derreck Wiltsey ASAD A on: 12/09/2016 02:21 PM   Modules accepted: Orders

## 2016-12-09 NOTE — Telephone Encounter (Signed)
Referral to psychiatry is placed

## 2016-12-09 NOTE — Telephone Encounter (Signed)
Pt asking to have a referral placed.

## 2016-12-13 ENCOUNTER — Encounter: Payer: Self-pay | Admitting: Family Medicine

## 2016-12-13 ENCOUNTER — Ambulatory Visit (INDEPENDENT_AMBULATORY_CARE_PROVIDER_SITE_OTHER): Payer: Medicare HMO | Admitting: Family Medicine

## 2016-12-13 DIAGNOSIS — F5105 Insomnia due to other mental disorder: Secondary | ICD-10-CM | POA: Diagnosis not present

## 2016-12-13 DIAGNOSIS — F4001 Agoraphobia with panic disorder: Secondary | ICD-10-CM

## 2016-12-13 DIAGNOSIS — M5416 Radiculopathy, lumbar region: Secondary | ICD-10-CM

## 2016-12-13 DIAGNOSIS — G629 Polyneuropathy, unspecified: Secondary | ICD-10-CM

## 2016-12-13 DIAGNOSIS — G8929 Other chronic pain: Secondary | ICD-10-CM

## 2016-12-13 DIAGNOSIS — G608 Other hereditary and idiopathic neuropathies: Secondary | ICD-10-CM

## 2016-12-13 DIAGNOSIS — F99 Mental disorder, not otherwise specified: Secondary | ICD-10-CM | POA: Diagnosis not present

## 2016-12-13 MED ORDER — CARISOPRODOL 350 MG PO TABS: 350.0000 mg | ORAL_TABLET | Freq: Three times a day (TID) | ORAL | 0 refills | Status: DC

## 2016-12-13 MED ORDER — QUETIAPINE FUMARATE 100 MG PO TABS: 100.0000 mg | ORAL_TABLET | Freq: Every day | ORAL | 1 refills | Status: AC

## 2016-12-13 MED ORDER — ALPRAZOLAM 2 MG PO TABS: 2.0000 mg | ORAL_TABLET | Freq: Three times a day (TID) | ORAL | 0 refills | Status: DC | PRN

## 2016-12-13 MED ORDER — PREGABALIN 75 MG PO CAPS: 75.0000 mg | ORAL_CAPSULE | Freq: Three times a day (TID) | ORAL | 0 refills | Status: DC

## 2016-12-13 MED ORDER — HYDROCODONE-ACETAMINOPHEN 10-325 MG PO TABS: 1.0000 | ORAL_TABLET | Freq: Four times a day (QID) | ORAL | 0 refills | Status: DC | PRN

## 2016-12-13 NOTE — Progress Notes (Signed)
Name: EVER HALBERG   MRN: [id number]    DOB: April 13, 1955   Date:12/13/2016       Progress Note  Subjective  Chief Complaint  Chief Complaint  Patient presents with  . Follow-up    1 month   . Medication Refill    Anxiety  Presents for follow-up visit. The problem has been gradually worsening. Symptoms include depressed mood, excessive worry, irritability, nervous/anxious behavior and panic. Patient reports no dizziness, insomnia or suicidal ideas. The severity of symptoms is causing significant distress and severe. The quality of sleep is poor.   Her past medical history is significant for anxiety/panic attacks. Past treatments include benzodiazephines. The treatment provided moderate relief. Compliance with prior treatments has been good.  Back Pain  This is a chronic problem. The problem is unchanged. The pain is present in the lumbar spine. The pain does not radiate. The pain is at a severity of 8/10. The pain is moderate. The pain is the same all the time. The symptoms are aggravated by bending and position. Associated symptoms include numbness (peripheral neuropathy) and tingling. Pertinent negatives include no bladder incontinence or bowel incontinence. She has tried analgesics and muscle relaxant (Lyrica and Hydrocodone for low back pain and  tingling in feet.) for the symptoms.  Insomnia  Primary symptoms: difficulty falling asleep, frequent awakening.  The problem occurs nightly. The symptoms are aggravated by anxiety and pain. Past treatments include medication. PMH includes: depression, chronic pain.    Past Medical History:  Diagnosis Date  . Anxiety   . Chronic lower back pain   . COPD (chronic obstructive pulmonary disease) ([place])   . Depression   . Hypertension   . Panic disorder with agoraphobia and severe panic attacks   . Peripheral sensory neuropathy     Past Surgical History:  Procedure Laterality Date  . none      Family History  Problem Relation Age of  Onset  . Cancer Mother        Lung  . Cancer Father        pancreatic  . Breast cancer Neg Hx   . Ovarian cancer Neg Hx   . Colon cancer Neg Hx   . Diabetes Neg Hx   . Heart disease Neg Hx     Social History   Socioeconomic History  . Marital status: Single    Spouse name: Not on file  . Number of children: Not on file  . Years of education: Not on file  . Highest education level: Not on file  Social Needs  . Financial resource strain: Not on file  . Food insecurity - worry: Not on file  . Food insecurity - inability: Not on file  . Transportation needs - medical: Not on file  . Transportation needs - non-medical: Not on file  Occupational History  . Not on file  Tobacco Use  . Smoking status: Current Every Day Smoker    Packs/day: 1.00    Years: 30.00    Pack years: 30.00    Types: Cigarettes  . Smokeless tobacco: Never Used  Substance and Sexual Activity  . Alcohol use: No  . Drug use: No  . Sexual activity: Not Currently  Other Topics Concern  . Not on file  Social History Narrative  . Not on file     Current Outpatient Medications:  .  albuterol (PROVENTIL HFA;VENTOLIN HFA) 108 (90 Base) MCG/ACT inhaler, Inhale 2 puffs into the lungs every 6 (six) hours as needed for  wheezing or shortness of breath., Disp: 1 Inhaler, Rfl: 1 .  alprazolam (XANAX) 2 MG tablet, Take 1 tablet (2 mg total) 3 (three) times daily as needed by mouth for anxiety., Disp: 90 tablet, Rfl: 0 .  amLODipine (NORVASC) 10 MG tablet, Take 1 tablet (10 mg total) by mouth daily., Disp: 90 tablet, Rfl: 1 .  carisoprodol (SOMA) 350 MG tablet, Take 1 tablet (350 mg total) 3 (three) times daily by mouth., Disp: 90 tablet, Rfl: 0 .  COMBIVENT RESPIMAT 20-100 MCG/ACT AERS respimat, INHALE 1 PUFF INTO THE LUNGS EVERY 6 HOURS AS NEEDED FOR WHEEZING OR SHORTNESS OF BREATH, Disp: 4 g, Rfl: 2 .  doxycycline (VIBRA-TABS) 100 MG tablet, Take 1 tablet (100 mg total) by mouth 2 (two) times daily. Take 1 (100 mg  tablet) by mouth 2 (two) times daily for 5 (five) days., Disp: 10 tablet, Rfl: 0 .  fluticasone furoate-vilanterol (BREO ELLIPTA) 100-25 MCG/INH AEPB, Inhale 1 puff into the lungs daily., Disp: 3 each, Rfl: 1 .  HYDROcodone-acetaminophen (NORCO) 10-325 MG tablet, Take 1 tablet every 6 (six) hours as needed by mouth., Disp: 120 tablet, Rfl: 0 .  ibuprofen (ADVIL,MOTRIN) 200 MG tablet, Take 200 mg by mouth every 6 (six) hours as needed for mild pain. , Disp: , Rfl:  .  meloxicam (MOBIC) 15 MG tablet, Take 1 tablet (15 mg total) by mouth daily., Disp: 30 tablet, Rfl: 0 .  mometasone (NASONEX) 50 MCG/ACT nasal spray, Place 2 sprays into the nose daily., Disp: 17 g, Rfl: 0 .  pantoprazole (PROTONIX) 40 MG tablet, TAKE 1 TABLET BY MOUTH ONCE A DAY, Disp: 30 tablet, Rfl: 0 .  potassium chloride (K-DUR) 10 MEQ tablet, TAKE 1 TABLET BY MOUTH ONCE A DAY, Disp: 90 tablet, Rfl: 1 .  potassium chloride (K-DUR) 10 MEQ tablet, TAKE 1 TABLET BY MOUTH ONCE DAILY, Disp: 90 tablet, Rfl: 0 .  predniSONE (STERAPRED UNI-PAK 21 TAB) 10 MG (21) TBPK tablet, 60 50 40 30 20 10  then STOP, Disp: 21 tablet, Rfl: 0 .  pregabalin (LYRICA) 75 MG capsule, Take 1 capsule (75 mg total) 3 (three) times daily by mouth., Disp: 90 capsule, Rfl: 0 .  QUEtiapine (SEROQUEL) 100 MG tablet, Take 1 tablet (100 mg total) by mouth at bedtime., Disp: 90 tablet, Rfl: 1 .  Azelastine HCl 0.15 % SOLN, Place 1 spray into the nose 2 (two) times daily., Disp: 1 mL, Rfl: 0  Allergies  Allergen Reactions  . Penicillins Rash and Other (See Comments)    .penallergy .penallergy     Review of Systems  Constitutional: Positive for irritability.  Gastrointestinal: Negative for bowel incontinence.  Genitourinary: Negative for bladder incontinence.  Musculoskeletal: Positive for back pain.  Neurological: Positive for tingling and numbness (peripheral neuropathy). Negative for dizziness.  Psychiatric/Behavioral: Positive for depression. Negative for  suicidal ideas. The patient is nervous/anxious. The patient does not have insomnia.       Objective  Vitals:   12/13/16 1151  BP: 110/72  Pulse: 80  Resp: 16  Temp: 98.6 F (37 C)  TempSrc: Oral  SpO2: 97%  Weight: 129 lb 1.6 oz (58.6 kg)    Physical Exam  Constitutional: She is well-developed, well-nourished, and in no distress.  Cardiovascular: Normal rate, regular rhythm and normal heart sounds.  No murmur heard. Pulmonary/Chest: Effort normal and breath sounds normal. She has no wheezes.  Abdominal: Soft. Bowel sounds are normal.  Musculoskeletal: She exhibits no edema.       Lumbar  back: She exhibits tenderness, pain and spasm.       Back:  Psychiatric: Her mood appears anxious. She has a flat affect.  Nursing note and vitals reviewed.     Assessment & Plan  1. Chronic radicular low back pain Stable, she has been referred to pain clinic and has an appointment in January 2019, refill for hydrocodone and Soma provided, advised on the need to follow up with pain clinic next month. - carisoprodol (SOMA) 350 MG tablet; Take 1 tablet (350 mg total) by mouth 3 (three) times daily.  Dispense: 90 tablet; Refill: 0 - HYDROcodone-acetaminophen (NORCO) 10-325 MG tablet; Take 1 tablet by mouth every 6 (six) hours as needed.  Dispense: 120 tablet; Refill: 0  2. Insomnia due to other mental disorder Stable, refill for cervical provided - QUEtiapine (SEROQUEL) 100 MG tablet; Take 1 tablet (100 mg total) by mouth at bedtime.  Dispense: 90 tablet; Refill: 1  3. Panic disorder with agoraphobia and severe panic attacks Patient has been referred to psychiatry and is awaiting a phone call to schedule an appointment, worsening anxiety because her PCP is leaving the practice. Refill for alprazolam provided, patient compliant with controlled substances agreement and understands the dependence potential, side effects and drug interactions of opioids and benzodiazepines. - alprazolam  (XANAX) 2 MG tablet; Take 1 tablet (2 mg total) by mouth 3 (three) times daily as needed for anxiety.  Dispense: 90 tablet; Refill: 0  4. Peripheral sensory neuropathy  - pregabalin (LYRICA) 75 MG capsule; Take 1 capsule (75 mg total) by mouth 3 (three) times daily.  Dispense: 90 capsule; Refill: 0   Shray Hunley [facility] 12/13/2016 12:10 PM

## 2016-12-14 DIAGNOSIS — F419 Anxiety disorder, unspecified: Secondary | ICD-10-CM | POA: Diagnosis not present

## 2016-12-20 ENCOUNTER — Telehealth: Payer: Self-pay | Admitting: Family Medicine

## 2016-12-20 ENCOUNTER — Other Ambulatory Visit: Payer: Self-pay | Admitting: Family Medicine

## 2016-12-20 DIAGNOSIS — E876 Hypokalemia: Secondary | ICD-10-CM

## 2016-12-20 NOTE — Telephone Encounter (Signed)
Copied from [place] [id number]. Topic: Quick Communication - Rx Refill/Question >> Dec 20, 2016 10:30 AM Oliver Pila B wrote: Reason for CRM: pt called b/c pharmacy is closed and is wanting a Rx for her potassium, also pt is concerned about transferring her PCP care to someone else within the practice, contact pt to advise

## 2016-12-22 ENCOUNTER — Other Ambulatory Visit: Payer: Self-pay | Admitting: Family Medicine

## 2016-12-22 ENCOUNTER — Other Ambulatory Visit: Payer: Self-pay

## 2016-12-22 DIAGNOSIS — E876 Hypokalemia: Secondary | ICD-10-CM

## 2016-12-22 NOTE — Telephone Encounter (Signed)
Relation to pt: self  Call back number: [phone] Pharmacy: [facility], [address] [place] [phone] (Phone) [phone] (Fax)     Reason for call:  Patient was informed by pharmacy potassium chloride was denied and patient would like to know why, please advise

## 2016-12-23 NOTE — Telephone Encounter (Signed)
Copied from [place]. Topic: Inquiry >> Dec 23, 2016  1:03 PM Patrice Paradise wrote: Reason for CRM: Patient would like for Dr. Manuella Ghazi or his assistant to give her a call back. Call in reference to her pi

## 2016-12-26 ENCOUNTER — Telehealth: Payer: Self-pay | Admitting: Family Medicine

## 2016-12-26 NOTE — Telephone Encounter (Signed)
Copied from [place] [id number]. Topic: Quick Communication - See Telephone Encounter >> Dec 26, 2016  9:49 AM Hewitt Shorts wrote: CRM for notification. See Telephone encounter for: pt is checking on status of her potassium she is very anxious she does not want to run out she has been told that she could have a heart attach   Best number     12/26/16.

## 2016-12-28 ENCOUNTER — Telehealth: Payer: Self-pay

## 2016-12-28 NOTE — Telephone Encounter (Signed)
Pt called our [place] center and was concern why she haven't received a call from our office regarding her Potasium refill request. I mention to [facility] that I did call and left a message on her cell to let her know that she would need to come in to have her those levels check. I see that a CRM was not left and or telephone encounter.  I see that I had her notes in my incomplete box therefore the note was never finish that I called her and left her message on 12/23/2016. Pt however schedule an apt with Dr.shah on 01/02/2017.

## 2017-01-02 ENCOUNTER — Ambulatory Visit: Payer: Medicare HMO | Admitting: Family Medicine

## 2017-01-04 ENCOUNTER — Other Ambulatory Visit: Payer: Self-pay

## 2017-01-04 DIAGNOSIS — G8929 Other chronic pain: Secondary | ICD-10-CM

## 2017-01-04 DIAGNOSIS — F4001 Agoraphobia with panic disorder: Secondary | ICD-10-CM

## 2017-01-04 DIAGNOSIS — M5416 Radiculopathy, lumbar region: Principal | ICD-10-CM

## 2017-01-04 DIAGNOSIS — G608 Other hereditary and idiopathic neuropathies: Secondary | ICD-10-CM

## 2017-01-04 DIAGNOSIS — G629 Polyneuropathy, unspecified: Secondary | ICD-10-CM

## 2017-01-04 NOTE — Telephone Encounter (Signed)
Patient should have appropriate follow-up with pain management and psychiatry specialist for management of her chronic conditions, we will be happy to refer her to her provider of choice

## 2017-01-04 NOTE — Telephone Encounter (Signed)
Copied from [employer]. Topic: General - Other >> Jan 04, 2017 11:39 AM Yvette Rack wrote: Reason for CRM: patient would like for Dr Manuella Ghazi to call Kathrine Haddock provider line is [phone] at [facility] family practice to see her as a NP she states that she need her medicines filled she has and New patient appt with Julian Hy on 01-11-17 and has an Pain Management 01-12-17 please call pt she's confused about what to do and if she should see Mittie Bodo schedule preferences is No new chronic pain on controlled substances not treated by pain management

## 2017-01-05 ENCOUNTER — Ambulatory Visit (INDEPENDENT_AMBULATORY_CARE_PROVIDER_SITE_OTHER): Payer: Medicare HMO | Admitting: Family Medicine

## 2017-01-05 ENCOUNTER — Encounter: Payer: Self-pay | Admitting: Family Medicine

## 2017-01-05 VITALS — BP 112/74 | HR 61 | Temp 97.6°F | Resp 16 | Wt 125.3 lb

## 2017-01-05 DIAGNOSIS — E876 Hypokalemia: Secondary | ICD-10-CM | POA: Diagnosis not present

## 2017-01-05 LAB — BASIC METABOLIC PANEL WITH GFR
BUN/Creatinine Ratio: 9 (calc) (ref 6–22)
BUN: 5 mg/dL — AB (ref 7–25)
CO2: 26 mmol/L (ref 20–32)
CREATININE: 0.55 mg/dL (ref 0.50–0.99)
Calcium: 9.9 mg/dL (ref 8.6–10.4)
Chloride: 96 mmol/L — ABNORMAL LOW (ref 98–110)
GFR, Est African American: 118 mL/min/{1.73_m2} (ref >=60)
GFR, Est Non African American: 102 mL/min/{1.73_m2} (ref >=60)
Glucose, Bld: 84 mg/dL (ref 65–99)
Potassium: 4.4 mmol/L (ref 3.5–5.3)
SODIUM: 131 mmol/L — AB (ref 135–146)

## 2017-01-05 MED ORDER — POTASSIUM CHLORIDE ER 10 MEQ PO TBCR: 10.0000 meq | EXTENDED_RELEASE_TABLET | Freq: Every day | ORAL | 0 refills | Status: DC

## 2017-01-05 NOTE — Progress Notes (Signed)
Name: Tamara Mcclure   MRN: [id number]    DOB: August 04, 1955   Date:01/05/2017       Progress Note  Subjective  Chief Complaint  Chief Complaint  Patient presents with  . Follow-up    Have her potassium levels check    HPI  Pt. Is here to recheck her potassium levels, she has been on Potassium Chloride 10 mEq daily, her last levels were marginally above normal at 5.5 mmol/L, she has been taking the medication as directed.    Past Medical History:  Diagnosis Date  . Anxiety   . Chronic lower back pain   . COPD (chronic obstructive pulmonary disease) ([place])   . Depression   . Hypertension   . Panic disorder with agoraphobia and severe panic attacks   . Peripheral sensory neuropathy     Past Surgical History:  Procedure Laterality Date  . none      Family History  Problem Relation Age of Onset  . Cancer Mother        Lung  . Cancer Father        pancreatic  . Breast cancer Neg Hx   . Ovarian cancer Neg Hx   . Colon cancer Neg Hx   . Diabetes Neg Hx   . Heart disease Neg Hx     Social History   Socioeconomic History  . Marital status: Single    Spouse name: Not on file  . Number of children: Not on file  . Years of education: Not on file  . Highest education level: Not on file  Social Needs  . Financial resource strain: Not on file  . Food insecurity - worry: Not on file  . Food insecurity - inability: Not on file  . Transportation needs - medical: Not on file  . Transportation needs - non-medical: Not on file  Occupational History  . Not on file  Tobacco Use  . Smoking status: Current Every Day Smoker    Packs/day: 1.00    Years: 30.00    Pack years: 30.00    Types: Cigarettes  . Smokeless tobacco: Never Used  Substance and Sexual Activity  . Alcohol use: No  . Drug use: No  . Sexual activity: Not Currently  Other Topics Concern  . Not on file  Social History Narrative  . Not on file     Current Outpatient Medications:  .  albuterol (PROVENTIL  HFA;VENTOLIN HFA) 108 (90 Base) MCG/ACT inhaler, Inhale 2 puffs into the lungs every 6 (six) hours as needed for wheezing or shortness of breath., Disp: 1 Inhaler, Rfl: 1 .  alprazolam (XANAX) 2 MG tablet, Take 1 tablet (2 mg total) by mouth 3 (three) times daily as needed for anxiety., Disp: 90 tablet, Rfl: 0 .  amLODipine (NORVASC) 10 MG tablet, Take 1 tablet (10 mg total) by mouth daily., Disp: 90 tablet, Rfl: 1 .  carisoprodol (SOMA) 350 MG tablet, Take 1 tablet (350 mg total) by mouth 3 (three) times daily., Disp: 90 tablet, Rfl: 0 .  COMBIVENT RESPIMAT 20-100 MCG/ACT AERS respimat, INHALE 1 PUFF INTO THE LUNGS EVERY 6 HOURS AS NEEDED FOR WHEEZING OR SHORTNESS OF BREATH, Disp: 4 g, Rfl: 2 .  doxycycline (VIBRA-TABS) 100 MG tablet, Take 1 tablet (100 mg total) by mouth 2 (two) times daily. Take 1 (100 mg tablet) by mouth 2 (two) times daily for 5 (five) days., Disp: 10 tablet, Rfl: 0 .  fluticasone furoate-vilanterol (BREO ELLIPTA) 100-25 MCG/INH AEPB, Inhale 1 puff  into the lungs daily., Disp: 3 each, Rfl: 1 .  HYDROcodone-acetaminophen (NORCO) 10-325 MG tablet, Take 1 tablet by mouth every 6 (six) hours as needed., Disp: 120 tablet, Rfl: 0 .  ibuprofen (ADVIL,MOTRIN) 200 MG tablet, Take 200 mg by mouth every 6 (six) hours as needed for mild pain. , Disp: , Rfl:  .  meloxicam (MOBIC) 15 MG tablet, Take 1 tablet (15 mg total) by mouth daily., Disp: 30 tablet, Rfl: 0 .  mometasone (NASONEX) 50 MCG/ACT nasal spray, Place 2 sprays into the nose daily., Disp: 17 g, Rfl: 0 .  pantoprazole (PROTONIX) 40 MG tablet, TAKE 1 TABLET BY MOUTH ONCE A DAY, Disp: 30 tablet, Rfl: 0 .  potassium chloride (K-DUR) 10 MEQ tablet, TAKE 1 TABLET BY MOUTH ONCE A DAY, Disp: 90 tablet, Rfl: 1 .  potassium chloride (K-DUR) 10 MEQ tablet, TAKE 1 TABLET BY MOUTH ONCE DAILY, Disp: 90 tablet, Rfl: 0 .  predniSONE (STERAPRED UNI-PAK 21 TAB) 10 MG (21) TBPK tablet, 60 50 40 30 20 10  then STOP, Disp: 21 tablet, Rfl: 0 .   pregabalin (LYRICA) 75 MG capsule, Take 1 capsule (75 mg total) by mouth 3 (three) times daily., Disp: 90 capsule, Rfl: 0 .  QUEtiapine (SEROQUEL) 100 MG tablet, Take 1 tablet (100 mg total) by mouth at bedtime., Disp: 90 tablet, Rfl: 1 .  Azelastine HCl 0.15 % SOLN, Place 1 spray into the nose 2 (two) times daily., Disp: 1 mL, Rfl: 0  Allergies  Allergen Reactions  . Penicillins Rash and Other (See Comments)    .penallergy .penallergy     ROS  Please see HPI for complete discussion of ROS  Objective  Vitals:   01/05/17 1148  BP: 112/74  Pulse: 61  Resp: 16  Temp: 97.6 F (36.4 C)  TempSrc: Oral  SpO2: 99%  Weight: 125 lb 4.8 oz (56.8 kg)    Physical Exam  Constitutional: She is oriented to person, place, and time and well-developed, well-nourished, and in no distress.  HENT:  Head: Normocephalic and atraumatic.  Cardiovascular: Normal rate, regular rhythm and normal heart sounds.  No murmur heard. Pulmonary/Chest: Breath sounds normal. No respiratory distress.  Neurological: She is alert and oriented to person, place, and time.  Psychiatric: Mood, memory, affect and judgment normal.  Nursing note and vitals reviewed.     Assessment & Plan  1. Hypokalemia Obtain potassium levels, refill for potassium chloride provided - potassium chloride (K-DUR) 10 MEQ tablet; Take 1 tablet (10 mEq total) by mouth daily.  Dispense: 90 tablet; Refill: 0 - BASIC METABOLIC PANEL WITH GFR   Via [facility] 01/05/2017 12:08 PM

## 2017-01-05 NOTE — Telephone Encounter (Signed)
Patient was seen today and was told by Dr. Manuella Ghazi verbally and pt understood

## 2017-01-07 IMAGING — CR DG CHEST 1V PORT
1 series · 2 of 2 positions shown · non-contrast
Comparison: March 22, 2014

CLINICAL DATA: Altered mental status.

EXAM:
PORTABLE CHEST - 1 VIEW

[Series 1: ap · 0.17mm/px · 2 of 2 slices shown]
[im 1/2]
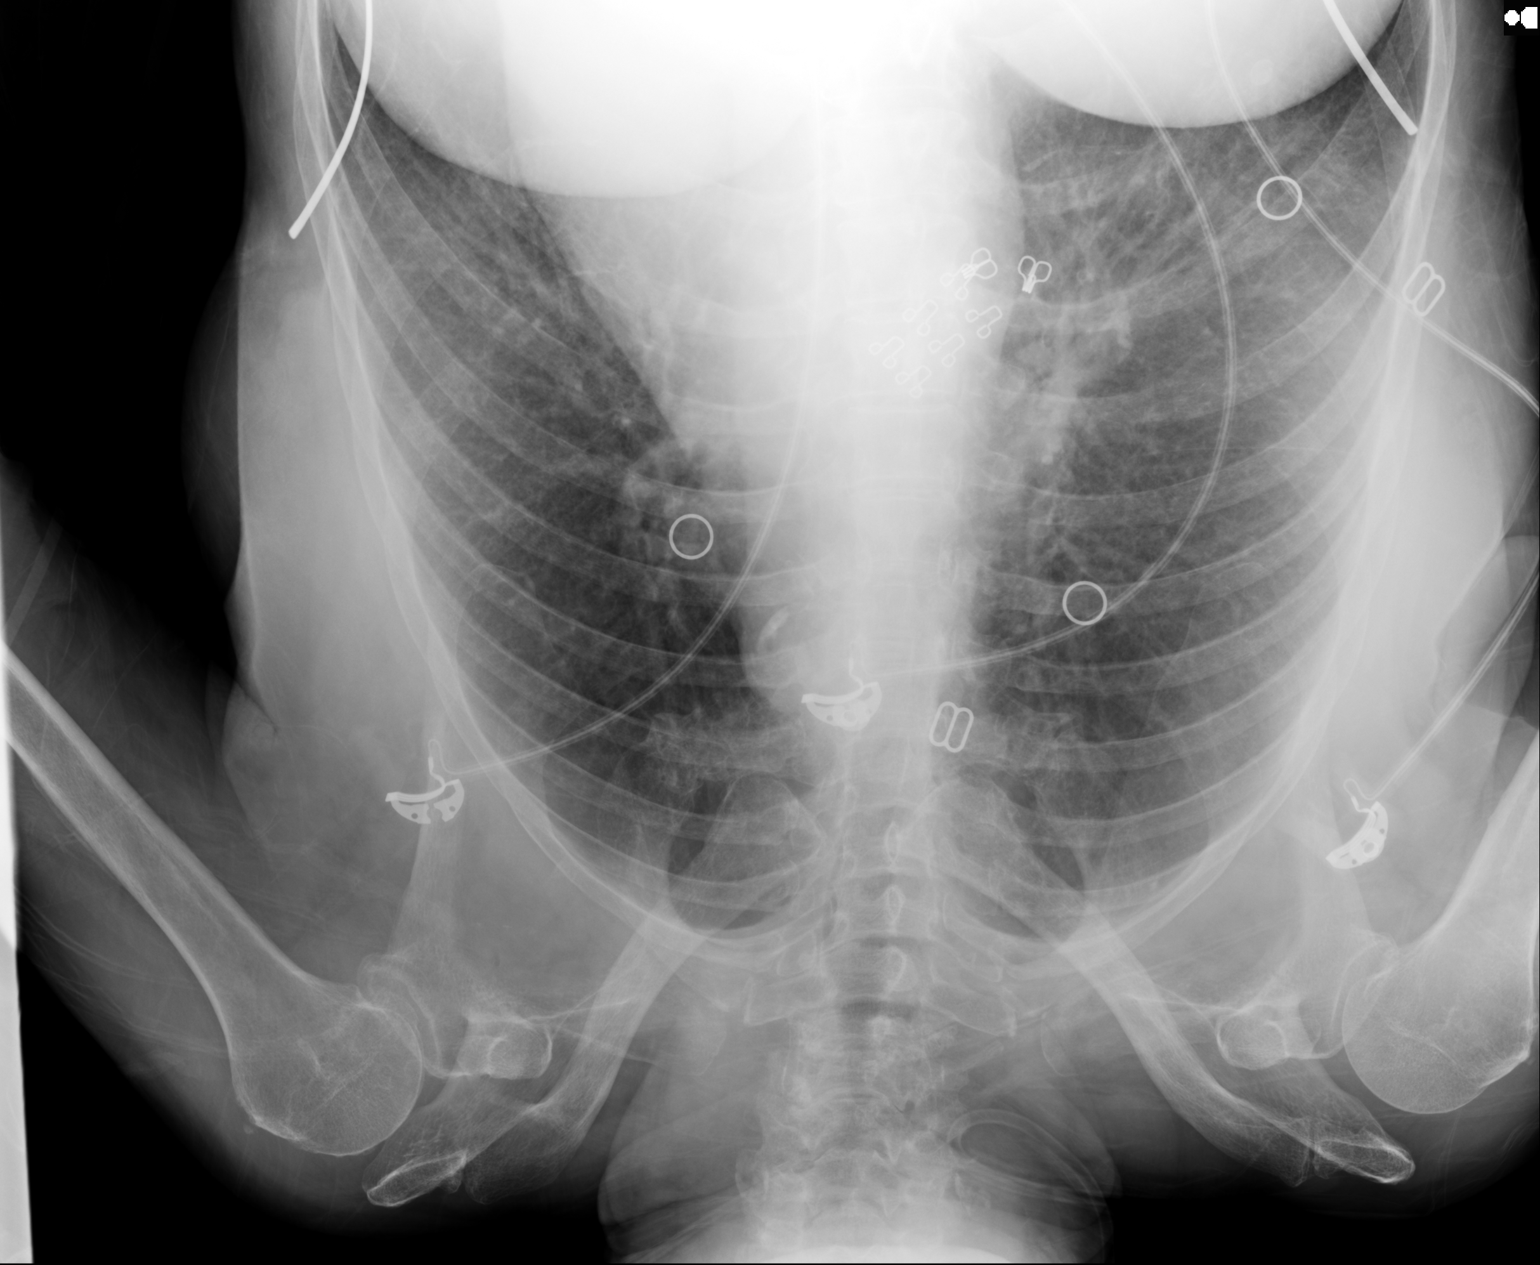
[im 2/2]
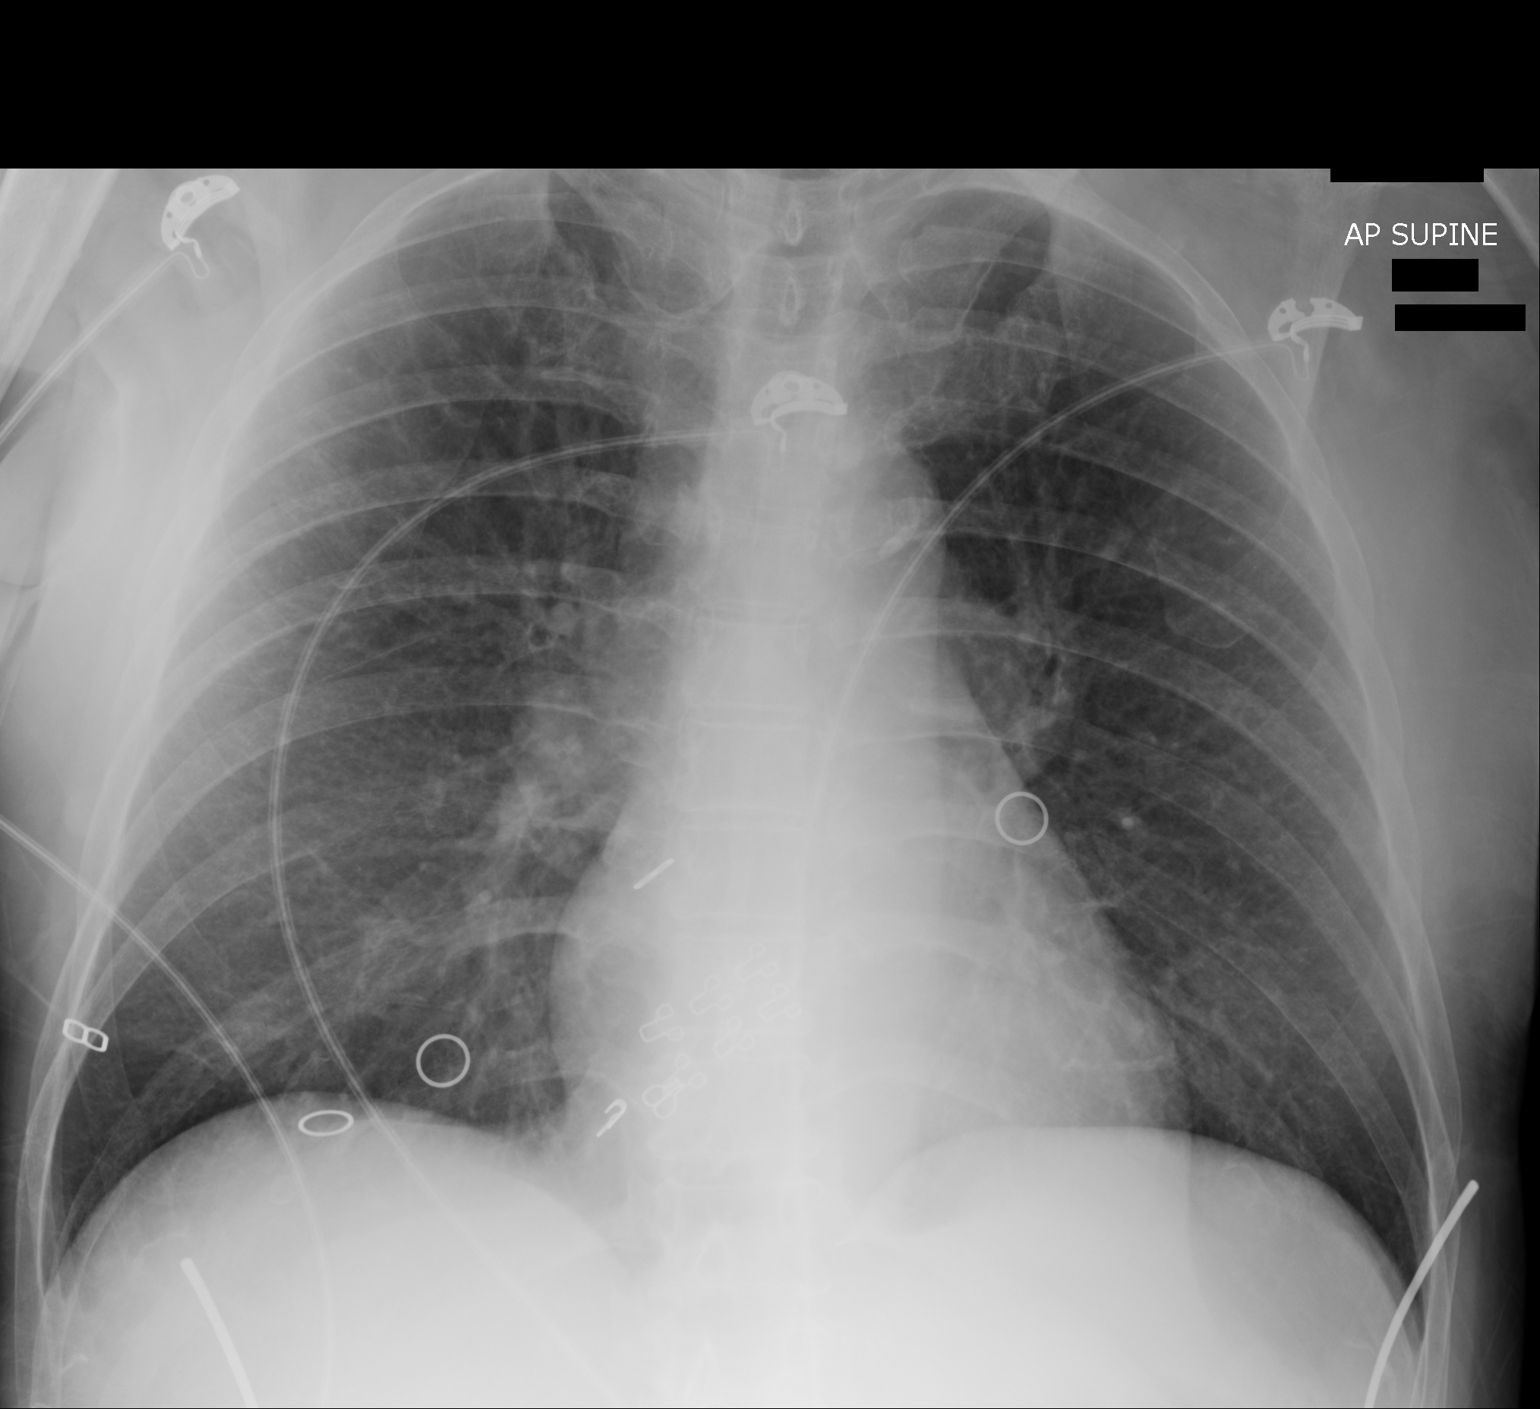

[2 of 2 positions shown; findings below may reference images not displayed]

FINDINGS: Lungs are clear. The heart size and pulmonary vascularity are
normal. No adenopathy. No bone lesions.
IMPRESSION: No edema or consolidation.

## 2017-01-11 ENCOUNTER — Ambulatory Visit: Payer: Medicare HMO | Admitting: Unknown Physician Specialty

## 2017-01-12 ENCOUNTER — Encounter: Payer: Self-pay | Admitting: Nurse Practitioner

## 2017-01-12 ENCOUNTER — Other Ambulatory Visit: Payer: Self-pay

## 2017-01-12 ENCOUNTER — Ambulatory Visit: Payer: Medicare HMO | Attending: Nurse Practitioner | Admitting: Nurse Practitioner

## 2017-01-12 ENCOUNTER — Telehealth: Payer: Self-pay | Admitting: Family Medicine

## 2017-01-12 VITALS — BP 138/91 | HR 78 | Temp 97.6°F | Ht 65.0 in | Wt 125.0 lb

## 2017-01-12 DIAGNOSIS — I10 Essential (primary) hypertension: Secondary | ICD-10-CM | POA: Diagnosis not present

## 2017-01-12 DIAGNOSIS — M79605 Pain in left leg: Secondary | ICD-10-CM

## 2017-01-12 DIAGNOSIS — J449 Chronic obstructive pulmonary disease, unspecified: Secondary | ICD-10-CM | POA: Diagnosis not present

## 2017-01-12 DIAGNOSIS — M545 Low back pain, unspecified: Secondary | ICD-10-CM | POA: Insufficient documentation

## 2017-01-12 DIAGNOSIS — F17210 Nicotine dependence, cigarettes, uncomplicated: Secondary | ICD-10-CM | POA: Insufficient documentation

## 2017-01-12 DIAGNOSIS — M899 Disorder of bone, unspecified: Secondary | ICD-10-CM | POA: Diagnosis not present

## 2017-01-12 DIAGNOSIS — M533 Sacrococcygeal disorders, not elsewhere classified: Secondary | ICD-10-CM

## 2017-01-12 DIAGNOSIS — Z79899 Other long term (current) drug therapy: Secondary | ICD-10-CM | POA: Diagnosis not present

## 2017-01-12 DIAGNOSIS — M5441 Lumbago with sciatica, right side: Secondary | ICD-10-CM | POA: Diagnosis not present

## 2017-01-12 DIAGNOSIS — Z79891 Long term (current) use of opiate analgesic: Secondary | ICD-10-CM | POA: Diagnosis not present

## 2017-01-12 DIAGNOSIS — M5442 Lumbago with sciatica, left side: Secondary | ICD-10-CM | POA: Diagnosis not present

## 2017-01-12 DIAGNOSIS — F329 Major depressive disorder, single episode, unspecified: Secondary | ICD-10-CM | POA: Diagnosis not present

## 2017-01-12 DIAGNOSIS — G894 Chronic pain syndrome: Secondary | ICD-10-CM

## 2017-01-12 DIAGNOSIS — M79604 Pain in right leg: Secondary | ICD-10-CM

## 2017-01-12 DIAGNOSIS — G4700 Insomnia, unspecified: Secondary | ICD-10-CM | POA: Insufficient documentation

## 2017-01-12 DIAGNOSIS — Z789 Other specified health status: Secondary | ICD-10-CM | POA: Diagnosis not present

## 2017-01-12 DIAGNOSIS — M79606 Pain in leg, unspecified: Secondary | ICD-10-CM

## 2017-01-12 DIAGNOSIS — G8929 Other chronic pain: Secondary | ICD-10-CM | POA: Diagnosis not present

## 2017-01-12 DIAGNOSIS — Z880 Allergy status to penicillin: Secondary | ICD-10-CM | POA: Diagnosis not present

## 2017-01-12 MED ORDER — CARISOPRODOL 350 MG PO TABS: 350.0000 mg | ORAL_TABLET | Freq: Three times a day (TID) | ORAL | 0 refills | Status: DC

## 2017-01-12 MED ORDER — PREGABALIN 75 MG PO CAPS: 75.0000 mg | ORAL_CAPSULE | Freq: Three times a day (TID) | ORAL | 0 refills | Status: AC

## 2017-01-12 MED ORDER — ALPRAZOLAM 2 MG PO TABS: 2.0000 mg | ORAL_TABLET | Freq: Three times a day (TID) | ORAL | 0 refills | Status: AC | PRN

## 2017-01-12 MED ORDER — HYDROCODONE-ACETAMINOPHEN 10-325 MG PO TABS: 1.0000 | ORAL_TABLET | Freq: Four times a day (QID) | ORAL | 0 refills | Status: DC | PRN

## 2017-01-12 NOTE — Telephone Encounter (Signed)
Patient states that she had here first visit at her pain clinic today and she have to go to two more steps before being consider for pain meds. Pt states the next aviable time for her apt want be until the first of February.  She also states the The behavorial clinic will not prescribed any of her anxiety meds; this is what they mention to her when her apt was schedule for 01/17/2016.  Will not prescribe those meds. Pt states that she didn't get the letter until last month, but she recived the letter by me on 11/15/2016. I mention that to the patient and we discuss and went over the letter. Pt states she didn't get the letter at all that was mailed out to her. Pt is crying and historical and saying she only have 4 days left of the all the meds.  Suggest to take half the pill to have more days. Pt states it want work. The pt is aware that is not an guaranteed the meds will be prescribed but I will send the request to  Dr. Manuella Ghazi. Pt understood.

## 2017-01-12 NOTE — Patient Instructions (Addendum)
You have been instructed to get labwork and xrays done today./  You have been informed that you must meet with a med psychologist for an appointment.  Please call and make a follow up appointment when all of these things are completed.  ____________________________________________________________________________________________  Appointment Policy Summary  It is our goal and responsibility to provide the medical community with assistance in the evaluation and management of patients with chronic pain. Unfortunately our resources are limited. Because we do not have an unlimited amount of time, or available appointments, we are required to closely monitor and manage their use. The following rules exist to maximize their use:  Patient's responsibilities: 1. Punctuality:  At what time should I arrive? You should be physically present in our office 30 minutes before your scheduled appointment. Your scheduled appointment is with your assigned healthcare provider. However, it takes 5-10 minutes to be "checked-in", and another 15 minutes for the nurses to do the admission. If you arrive to our office at the time you were given for your appointment, you will end up being at least 20-25 minutes late to your appointment with the provider. 2. Tardiness:  What happens if I arrive only a few minutes after my scheduled appointment time? You will need to reschedule your appointment. The cutoff is your appointment time. This is why it is so important that you arrive at least 30 minutes before that appointment. If you have an appointment scheduled for 10:00 AM and you arrive at 10:01, you will be required to reschedule your appointment.  3. Plan ahead:  Always assume that you will encounter traffic on your way in. Plan for it. If you are dependent on a driver, make sure they understand these rules and the need to arrive early. 4. Other appointments and responsibilities:  Avoid scheduling any other appointments before or  after your pain clinic appointments.  5. Be prepared:  Write down everything that you need to discuss with your healthcare provider and give this information to the admitting nurse. Write down the medications that you will need refilled. Bring your pills and bottles (even the empty ones), to all of your appointments, except for those where a procedure is scheduled. 6. No children or pets:  Find someone to take care of them. It is not appropriate to bring them in. 7. Scheduling changes:  We request "advanced notification" of any changes or cancellations. 8. Advanced notification:  Defined as a time period of more than 24 hours prior to the originally scheduled appointment. This allows for the appointment to be offered to other patients. 9. Rescheduling:  When a visit is rescheduled, it will require the cancellation of the original appointment. For this reason they both fall within the category of "Cancellations".  10. Cancellations:  They require advanced notification. Any cancellation less than 24 hours before the  appointment will be recorded as a "No Show". 11. No Show:  Defined as an unkept appointment where the patient failed to notify or declare to the practice their intention or inability to keep the appointment.  Corrective process for repeat offenders:  1. Tardiness: Three (3) episodes of rescheduling due to late arrivals will be recorded as one (1) "No Show". 2. Cancellation or reschedule: Three (3) cancellations or rescheduling will be recorded as one (1) "No Show". 3. "No Shows": Three (3) "No Shows" within a 12 month period will result in discharge from the practice.  ____________________________________________________________________________________________   ____________________________________________________________________________________________  Pain Scale  Introduction: The pain score used by this  practice is the Verbal Numerical Rating Scale (VNRS-11). This is an  11-point scale. It is for adults and children 10 years or older. There are significant differences in how the pain score is reported, used, and applied. Forget everything you learned in the past and learn this scoring system.  General Information: The scale should reflect your current level of pain. Unless you are specifically asked for the level of your worst pain, or your average pain. If you are asked for one of these two, then it should be understood that it is over the past 24 hours.  Basic Activities of Daily Living (ADL): Personal hygiene, dressing, eating, transferring, and using restroom.  Instructions: Most patients tend to report their level of pain as a combination of two factors, their physical pain and their psychosocial pain. This last one is also known as "suffering" and it is reflection of how physical pain affects you socially and psychologically. From now on, report them separately. From this point on, when asked to report your pain level, report only your physical pain. Use the following table for reference.  Pain Clinic Pain Levels (0-5/10)  Pain Level Score  Description  No Pain 0   Mild pain 1 Nagging, annoying, but does not interfere with basic activities of daily living (ADL). Patients are able to eat, bathe, get dressed, toileting (being able to get on and off the toilet and perform personal hygiene functions), transfer (move in and out of bed or a chair without assistance), and maintain continence (able to control bladder and bowel functions). Blood pressure and heart rate are unaffected. A normal heart rate for a healthy adult ranges from 60 to 100 bpm (beats per minute).   Mild to moderate pain 2 Noticeable and distracting. Impossible to hide from other people. More frequent flare-ups. Still possible to adapt and function close to normal. It can be very annoying and may have occasional stronger flare-ups. With discipline, patients may get used to it and adapt.   Moderate  pain 3 Interferes significantly with activities of daily living (ADL). It becomes difficult to feed, bathe, get dressed, get on and off the toilet or to perform personal hygiene functions. Difficult to get in and out of bed or a chair without assistance. Very distracting. With effort, it can be ignored when deeply involved in activities.   Moderately severe pain 4 Impossible to ignore for more than a few minutes. With effort, patients may still be able to manage work or participate in some social activities. Very difficult to concentrate. Signs of autonomic nervous system discharge are evident: dilated pupils (mydriasis); mild sweating (diaphoresis); sleep interference. Heart rate becomes elevated (>115 bpm). Diastolic blood pressure (lower number) rises above 100 mmHg. Patients find relief in laying down and not moving.   Severe pain 5 Intense and extremely unpleasant. Associated with frowning face and frequent crying. Pain overwhelms the senses.  Ability to do any activity or maintain social relationships becomes significantly limited. Conversation becomes difficult. Pacing back and forth is common, as getting into a comfortable position is nearly impossible. Pain wakes you up from deep sleep. Physical signs will be obvious: pupillary dilation; increased sweating; goosebumps; brisk reflexes; cold, clammy hands and feet; nausea, vomiting or dry heaves; loss of appetite; significant sleep disturbance with inability to fall asleep or to remain asleep. When persistent, significant weight loss is observed due to the complete loss of appetite and sleep deprivation.  Blood pressure and heart rate becomes significantly elevated. Caution: If elevated blood  pressure triggers a pounding headache associated with blurred vision, then the patient should immediately seek attention at an urgent or emergency care unit, as these may be signs of an impending stroke.    Emergency Department Pain Levels (6-10/10)  Emergency  Room Pain 6 Severely limiting. Requires emergency care and should not be seen or managed at an outpatient pain management facility. Communication becomes difficult and requires great effort. Assistance to reach the emergency department may be required. Facial flushing and profuse sweating along with potentially dangerous increases in heart rate and blood pressure will be evident.   Distressing pain 7 Self-care is very difficult. Assistance is required to transport, or use restroom. Assistance to reach the emergency department will be required. Tasks requiring coordination, such as bathing and getting dressed become very difficult.   Disabling pain 8 Self-care is no longer possible. At this level, pain is disabling. The individual is unable to do even the most "basic" activities such as walking, eating, bathing, dressing, transferring to a bed, or toileting. Fine motor skills are lost. It is difficult to think clearly.   Incapacitating pain 9 Pain becomes incapacitating. Thought processing is no longer possible. Difficult to remember your own name. Control of movement and coordination are lost.   The worst pain imaginable 10 At this level, most patients pass out from pain. When this level is reached, collapse of the autonomic nervous system occurs, leading to a sudden drop in blood pressure and heart rate. This in turn results in a temporary and dramatic drop in blood flow to the brain, leading to a loss of consciousness. Fainting is one of the body's self defense mechanisms. Passing out puts the brain in a calmed state and causes it to shut down for a while, in order to begin the healing process.    Summary: 1. Refer to this scale when providing [place] with your pain level. 2. Be accurate and careful when reporting your pain level. This will help with your care. 3. Over-reporting your pain level will lead to loss of credibility. 4. Even a level of 1/10 means that there is pain and will be treated at our  facility. 5. High, inaccurate reporting will be documented as "Symptom Exaggeration", leading to loss of credibility and suspicions of possible secondary gains such as obtaining more narcotics, or wanting to appear disabled, for fraudulent reasons. 6. Only pain levels of 5 or below will be seen at our facility. 7. Pain levels of 6 and above will be sent to the Emergency Department and the appointment cancelled. ____________________________________________________________________________________________

## 2017-01-12 NOTE — Telephone Encounter (Signed)
I have reviewed and note from the initial intake at the pain clinic, patient reports that she has to go through psychological testing and initial workup before the pain clinic will write her prescriptions. Similarly, she has not had success with finding a psychiatrist who will be willing to prescribe Xanax for her, she has a friend that goes to a doctor in [place] and can write her prescriptions. I have advised her that she can start tapering off Xanax by taking 1 mg less every week so that in 6 weeks she should have tapered off of Xanax completely, and in the meantime I can start her on a non-benzodiazepine anxiolytic for treatment of anxiety and panic attacks. Patient verbalized agreement and will return tomorrow to pick up her prescriptions

## 2017-01-12 NOTE — Telephone Encounter (Signed)
Copied from [id number]. Topic: Quick Communication - See Telephone Encounter >> Jan 12, 2017  2:40 PM Cleaster Corin, NT wrote: CRM for notification. See Telephone encounter for:   01/12/17. Pt. Needs refill on meds. Lyrica /soma/hydrocodone-acetaminophen  and xanax pt. Can be reached at [phone]

## 2017-01-12 NOTE — Progress Notes (Signed)
Safety precautions to be maintained throughout the outpatient stay will include: orient to surroundings, keep bed in low position, maintain call bell within reach at all times, provide assistance with transfer out of bed and ambulation.  

## 2017-01-12 NOTE — Progress Notes (Signed)
Patient's Name: Tamara Mcclure  MRN: [id number]  Referring Provider: Roselee Nova, MD  DOB: November 09, 1955  PCP: Roselee Nova, MD  DOS: 01/12/2017  Note by: Dionisio David NP  Service setting: Ambulatory outpatient  Specialty: Interventional Pain Management  Location: [facility] (AMB) Pain Management Facility    Patient type: New Patient    Primary Reason(s) for Visit: Initial Patient Evaluation CC: Back Pain; Leg Pain (bilateral); and Foot Pain (bilateral)  HPI  Ms. Alkins is a 62 y.o. year old, female patient, who comes today for an initial evaluation. She has Chronic radicular low back pain; Insomnia; Panic disorder with agoraphobia and severe panic attacks; Peripheral sensory neuropathy; Anxiety and depression; Airway hyperreactivity; At risk for falling; Foot pain; Burning sensation of feet; CAFL (chronic airflow limitation) ([place]); Lumbosacral spondylosis; Clinical depression; DDD (degenerative disc disease), lumbosacral; Discharge of ear; Degenerative arthritis of lumbar spine; Hyperlipidemia; Dysfunction of eustachian tube; ANA positive; Hypomagnesemia; Hypokalemia; Cerumen impaction; Cramps of lower extremity; Leg swelling; Spasm; Numbness and tingling; Compulsive tobacco user syndrome; Atrophy of vagina; Menopausal symptoms; Hypertension; Dry skin; COPD (chronic obstructive pulmonary disease) ([place]); Nasal sinus congestion; Postprandial abdominal bloating; Lesion of skin of face; Pain of left breast; Bilateral leg pain; Acute sinusitis; Need for home health care; Decreased vision; Discoloration of skin of finger; URI with cough and congestion; Joint stiffness of hand; Post-nasal drainage; Chronic low back pain (Primary Area of Pain) (Bilateral) (R>L); Chronic pain of lower extremity (Secondary Area of Pain) (B (R>L); Chronic pain syndrome; Long term current use of opiate analgesic; Disorder of bone, unspecified; Other long term (current) drug therapy; Other specified health status; and Chronic  sacroiliac joint pain on their problem list.. Her primarily concern today is the Back Pain; Leg Pain (bilateral); and Foot Pain (bilateral)  Pain Assessment: Location: Lower Back(leg, feet) Radiating: denies Onset: More than a month ago Duration: Chronic pain Quality: Throbbing Severity: 7 /10 (self-reported pain score)  Note: Reported level is compatible with observation. Clinically the patient looks like a 3/10 A 3/10 is viewed as "Moderate" and described as significantly interfering with activities of daily living (ADL). It becomes difficult to feed, bathe, get dressed, get on and off the toilet or to perform personal hygiene functions. Difficult to get in and out of bed or a chair without assistance. Very distracting. With effort, it can be ignored when deeply involved in activities. Information on the proper use of the pain scale provided to the patient today. When using our objective Pain Scale, levels between 6 and 10/10 are said to belong in an emergency room, as it progressively worsens from a 6/10, described as severely limiting, requiring emergency care not usually available at an outpatient pain management facility. At a 6/10 level, communication becomes difficult and requires great effort. Assistance to reach the emergency department may be required. Facial flushing and profuse sweating along with potentially dangerous increases in heart rate and blood pressure will be evident. Timing: Constant Modifying factors: medictions  Onset and Duration: Present longer than 3 months Cause of pain: Unknown Severity: Getting worse, NAS-11 at its worse: 9/10, NAS-11 at its best: 7/10 and NAS-11 on the average: 7/10 Timing: Morning, Not influenced by the time of the day and During activity or exercise Aggravating Factors: Bending, Climbing, Lifiting, Squatting, Twisting, Walking, Walking uphill and Walking downhill Alleviating Factors: Medications and Nerve blocks Associated Problems: Depression,  Fatigue, Numbness, Sadness, Sweating and Pain that wakes patient up Quality of Pain: Aching, Burning, Constant and Horrible Previous  Examinations or Tests: X-rays Previous Treatments: Narcotic medications  The patient comes into the clinics today for the first time for a chronic pain management evaluation. According to the patient her primary area of pain is in her lower back. She denies any precipitating factors. She denies any previous surgeries, interventional therapy, physical therapy or recent images.  Her second area of pain is in her lower extremities. She admits that the right is greater than the left. She has numbness tingling and weakness. She denies any recent falls. She denies any previous nerve conduction studies.   Today I took the time to provide the patient with information regarding this pain practice. The patient was informed that the practice is divided into two sections: an interventional pain management section, as well as a completely separate and distinct medication management section. I explained that there are procedure days for interventional therapies, and evaluation days for follow-ups and medication management. Because of the amount of documentation required during both, they are kept separated. This means that there is the possibility that she may be scheduled for a procedure on one day, and medication management the next. I have also informed her alprazolam 2 mg, cortisol propanol, that because of staffing and facility limitations, this practice will no longer take patients for medication management only. To illustrate the reasons for this, I gave the patient the example of surgeons, and how inappropriate it would be to refer a patient to his/her care, just to write for the post-surgical antibiotics on a surgery done by a different surgeon.   Because interventional pain management is part of the board-certified specialty for the doctors, the patient was informed that  joining this practice means that they are open to any and all interventional therapies. I made it clear that this does not mean that they will be forced to have any procedures done. What this means is that I believe interventional therapies to be essential part of the diagnosis and proper management of chronic pain conditions. Therefore, patients not interested in these interventional alternatives will be better served under the care of a different practitioner.  The patient was also made aware of my Comprehensive Pain Management Safety Guidelines where by joining this practice, they limit all of their nerve blocks and joint injections to those done by our practice, for as long as we are retained to manage their care. Historic Controlled Substance Pharmacotherapy Review  PMP and historical list of controlled substances: Alprazolam 2 mg, carisoprodol 350 mg, Lyrica 75 mg, hydrocodone/acetaminophen 10/325 mg, tramadol/acetaminophen 37.5/325, Highest opioid analgesic regimen found: Hydrocodone/acetaminophen 10/325 mg 4 times daily (fill dates 12/17/2016) (hydrocodone 40 mg per day) Most recent opioid analgesic: Hydrocodone/acetaminophen 10/325 mg 4 times daily (fill dates 12/17/2016) (hydrocodone 40 mg per day) Current opioid analgesics: Hydrocodone/acetaminophen 10/325 mg 4 times daily (fill dates 12/17/2016) (hydrocodone 40 mg per day) Highest recorded MME/day: 40 mg/day MME/day: 40 mg/day Medications: The patient did not bring the medication(s) to the appointment, as requested in our "New Patient Package" Pharmacodynamics: Desired effects: Analgesia: The patient reports >50% benefit. Reported improvement in function: The patient reports medication allows her to accomplish basic ADLs. Clinically meaningful improvement in function (CMIF): Sustained CMIF goals met Perceived effectiveness: Described as relatively effective, allowing for increase in activities of daily living (ADL) Undesirable  effects: Side-effects or Adverse reactions: None reported Historical Monitoring: The patient  reports that she does not use drugs. List of all UDS Test(s): Lab Results  Component Value Date   MDMA NONE DETECTED 09/29/2014  MDMA NEGATIVE 04/11/2014   MDMA NEGATIVE 04/05/2014   MDMA NEGATIVE 02/08/2014   COCAINSCRNUR NONE DETECTED 09/29/2014   COCAINSCRNUR NEGATIVE 04/11/2014   COCAINSCRNUR NEGATIVE 04/05/2014   COCAINSCRNUR NEGATIVE 02/08/2014   PCPSCRNUR NONE DETECTED 09/29/2014   PCPSCRNUR NEGATIVE 04/11/2014   PCPSCRNUR NEGATIVE 04/05/2014   PCPSCRNUR NEGATIVE 02/08/2014   THCU NONE DETECTED 09/29/2014   THCU NEGATIVE 04/11/2014   THCU NEGATIVE 04/05/2014   THCU NEGATIVE 02/08/2014   List of all Serum Drug Screening Test(s):  No results found for: AMPHSCRSER, BARBSCRSER, BENZOSCRSER, COCAINSCRSER, PCPSCRSER, PCPQUANT, THCSCRSER, CANNABQUANT, OPIATESCRSER, OXYSCRSER, PROPOXSCRSER Historical Background Evaluation: [place] PDMP: Six (6) year initial data search conducted.             [place] Department of public safety, offender search: Editor, commissioning Information) Non-contributory Risk Assessment Profile: Aberrant behavior: dysfunctional emotional process Risk factors for fatal opioid overdose: Benzodiazepine use, bipolar disorder, caucasian and concomitant use of Benzodiazepines Fatal overdose hazard ratio (HR): Calculation deferred Non-fatal overdose hazard ratio (HR): Calculation deferred Risk of opioid abuse or dependence: 0.7-3.0% with doses ? 36 MME/day and 6.1-26% with doses ? 120 MME/day. Substance use disorder (SUD) risk level: Pending results of Medical Psychology Evaluation for SUD Opioid risk tool (ORT) (Total Score): 1  ORT Scoring interpretation table:  Score <3 = Low Risk for SUD  Score between 4-7 = Moderate Risk for SUD  Score >8 = High Risk for Opioid Abuse   PHQ-2 Depression Scale:  Total score: 0  PHQ-2 Scoring interpretation table: (Score and probability of major  depressive disorder)  Score 0 = No depression  Score 1 = 15.4% Probability  Score 2 = 21.1% Probability  Score 3 = 38.4% Probability  Score 4 = 45.5% Probability  Score 5 = 56.4% Probability  Score 6 = 78.6% Probability   PHQ-9 Depression Scale:  Total score: 0  PHQ-9 Scoring interpretation table:  Score 0-4 = No depression  Score 5-9 = Mild depression  Score 10-14 = Moderate depression  Score 15-19 = Moderately severe depression  Score 20-27 = Severe depression (2.4 times higher risk of SUD and 2.89 times higher risk of overuse)   Pharmacologic Plan: Pending ordered tests and/or consults  Meds  The patient has a current medication list which includes the following prescription(s): albuterol, alprazolam, amlodipine, carisoprodol, combivent respimat, fluticasone furoate-vilanterol, hydrocodone-acetaminophen, ibuprofen, potassium chloride, pregabalin, quetiapine, and azelastine hcl.  Current Outpatient Medications on File Prior to Visit  Medication Sig  . albuterol (PROVENTIL HFA;VENTOLIN HFA) 108 (90 Base) MCG/ACT inhaler Inhale 2 puffs into the lungs every 6 (six) hours as needed for wheezing or shortness of breath.  . alprazolam (XANAX) 2 MG tablet Take 1 tablet (2 mg total) by mouth 3 (three) times daily as needed for anxiety.  Marland Kitchen amLODipine (NORVASC) 10 MG tablet Take 1 tablet (10 mg total) by mouth daily.  . carisoprodol (SOMA) 350 MG tablet Take 1 tablet (350 mg total) by mouth 3 (three) times daily.  . COMBIVENT RESPIMAT 20-100 MCG/ACT AERS respimat INHALE 1 PUFF INTO THE LUNGS EVERY 6 HOURS AS NEEDED FOR WHEEZING OR SHORTNESS OF BREATH  . fluticasone furoate-vilanterol (BREO ELLIPTA) 100-25 MCG/INH AEPB Inhale 1 puff into the lungs daily.  Marland Kitchen HYDROcodone-acetaminophen (NORCO) 10-325 MG tablet Take 1 tablet by mouth every 6 (six) hours as needed.  Marland Kitchen ibuprofen (ADVIL,MOTRIN) 200 MG tablet Take 200 mg by mouth every 6 (six) hours as needed for mild pain.   . potassium chloride  (K-DUR) 10 MEQ tablet TAKE 1 TABLET BY  MOUTH ONCE A DAY  . pregabalin (LYRICA) 75 MG capsule Take 1 capsule (75 mg total) by mouth 3 (three) times daily.  . QUEtiapine (SEROQUEL) 100 MG tablet Take 1 tablet (100 mg total) by mouth at bedtime.  . Azelastine HCl 0.15 % SOLN Place 1 spray into the nose 2 (two) times daily.   No current facility-administered medications on file prior to visit.    Imaging Review    Note: Available results from prior imaging studies were reviewed.        ROS  Cardiovascular History: No reported cardiovascular signs or symptoms such as High blood pressure, coronary artery disease, abnormal heart rate or rhythm, heart attack, blood thinner therapy or heart weakness and/or failure Pulmonary or Respiratory History: Lung problems and Smoking Neurological History: No reported neurological signs or symptoms such as seizures, abnormal skin sensations, urinary and/or fecal incontinence, being born with an abnormal open spine and/or a tethered spinal cord Review of Past Neurological Studies: No results found for this or any previous visit. Psychological-Psychiatric History: No reported psychological or psychiatric signs or symptoms such as difficulty sleeping, anxiety, depression, delusions or hallucinations (schizophrenial), mood swings (bipolar disorders) or suicidal ideations or attempts Gastrointestinal History: No reported gastrointestinal signs or symptoms such as vomiting or evacuating blood, reflux, heartburn, alternating episodes of diarrhea and constipation, inflamed or scarred liver, or pancreas or irrregular and/or infrequent bowel movements Genitourinary History: No reported renal or genitourinary signs or symptoms such as difficulty voiding or producing urine, peeing blood, non-functioning kidney, kidney stones, difficulty emptying the bladder, difficulty controlling the flow of urine, or chronic kidney disease Hematological History: No reported hematological signs  or symptoms such as prolonged bleeding, low or poor functioning platelets, bruising or bleeding easily, hereditary bleeding problems, low energy levels due to low hemoglobin or being anemic Endocrine History: No reported endocrine signs or symptoms such as high or low blood sugar, rapid heart rate due to high thyroid levels, obesity or weight gain due to slow thyroid or thyroid disease Rheumatologic History: No reported rheumatological signs and symptoms such as fatigue, joint pain, tenderness, swelling, redness, heat, stiffness, decreased range of motion, with or without associated rash Musculoskeletal History: Negative for myasthenia gravis, muscular dystrophy, multiple sclerosis or malignant hyperthermia Work History: Unemployed  Allergies  Ms. Tripp is allergic to penicillins.  Laboratory Chemistry  Inflammation Markers Lab Results  Component Value Date   ESRSEDRATE 1 11/26/2015   (CRP: Acute Phase) (ESR: Chronic Phase) Renal Function Markers Lab Results  Component Value Date   BUN 5 (L) 01/05/2017   CREATININE 0.55 01/05/2017   GFRAA 118 01/05/2017   GFRNONAA 102 01/05/2017   Hepatic Function Markers Lab Results  Component Value Date   AST 18 04/22/2015   ALT 20 04/22/2015   ALBUMIN 4.6 04/22/2015   ALKPHOS 122 (H) 04/22/2015   Electrolytes Lab Results  Component Value Date   NA 131 (L) 01/05/2017   K 4.4 01/05/2017   CL 96 (L) 01/05/2017   CALCIUM 9.9 01/05/2017   MG 1.8 09/29/2014   Neuropathy Markers No results found for: [id number] Bone Pathology Markers Lab Results  Component Value Date   ALKPHOS 122 (H) 04/22/2015   CALCIUM 9.9 01/05/2017   Coagulation Parameters Lab Results  Component Value Date   INR 0.9 04/11/2014   LABPROT 12.5 04/11/2014   PLT 342 09/29/2014   Cardiovascular Markers Lab Results  Component Value Date   BNP 342 (H) 10/19/2012   HGB 14.0 09/29/2014   HCT 39.9  09/29/2014   Note: Lab results reviewed.  [place]  Drug: Ms. Hilton   reports that she does not use drugs. Alcohol:  reports that she does not drink alcohol. Tobacco:  reports that she has been smoking cigarettes.  She has a 30.00 pack-year smoking history. she has never used smokeless tobacco. Medical:  has a past medical history of Anxiety, Chronic lower back pain, COPD (chronic obstructive pulmonary disease) ([place]), Depression, Hypertension, Panic disorder with agoraphobia and severe panic attacks, and Peripheral sensory neuropathy. Family: family history includes Cancer in her father and mother.  Past Surgical History:  Procedure Laterality Date  . none     Active Ambulatory Problems    Diagnosis Date Noted  . Chronic radicular low back pain 06/11/2014  . Insomnia 06/11/2014  . Panic disorder with agoraphobia and severe panic attacks 06/11/2014  . Peripheral sensory neuropathy 07/10/2014  . Anxiety and depression 07/10/2014  . Airway hyperreactivity 07/10/2014  . At risk for falling 07/10/2014  . Foot pain 12/17/2013  . Burning sensation of feet 12/17/2013  . CAFL (chronic airflow limitation) ([place]) 06/21/2011  . Lumbosacral spondylosis 07/10/2014  . Clinical depression 06/21/2011  . DDD (degenerative disc disease), lumbosacral 07/10/2014  . Discharge of ear 07/10/2014  . Degenerative arthritis of lumbar spine 07/10/2014  . Hyperlipidemia 07/10/2014  . Dysfunction of eustachian tube 07/10/2014  . ANA positive 07/10/2014  . Hypomagnesemia 07/10/2014  . Hypokalemia 07/10/2014  . Cerumen impaction 07/10/2014  . Cramps of lower extremity 07/10/2014  . Leg swelling 07/10/2014  . Spasm 07/10/2014  . Numbness and tingling 12/17/2013  . Compulsive tobacco user syndrome 07/10/2014  . Atrophy of vagina 07/10/2014  . Menopausal symptoms 08/05/2014  . Hypertension 09/02/2014  . Dry skin 09/02/2014  . COPD (chronic obstructive pulmonary disease) ([place]) 11/03/2014  . Nasal sinus congestion 11/03/2014  . Postprandial abdominal bloating 11/11/2014  .  Lesion of skin of face 12/25/2014  . Pain of left breast 05/21/2015  . Bilateral leg pain 06/18/2015  . Acute sinusitis 07/16/2015  . Need for home health care 07/20/2015  . Decreased vision 07/20/2015  . Discoloration of skin of finger 11/26/2015  . URI with cough and congestion 01/19/2016  . Joint stiffness of hand 04/07/2016  . Post-nasal drainage 10/31/2016  . Chronic low back pain (Primary Area of Pain) (Bilateral) (R>L) 01/12/2017  . Chronic pain of lower extremity (Secondary Area of Pain) (B (R>L) 01/12/2017  . Chronic pain syndrome 01/12/2017  . Long term current use of opiate analgesic 01/12/2017  . Disorder of bone, unspecified 01/12/2017  . Other long term (current) drug therapy 01/12/2017  . Other specified health status 01/12/2017  . Chronic sacroiliac joint pain 01/12/2017   Resolved Ambulatory Problems    Diagnosis Date Noted  . Chronic LBP 12/17/2013  . Congested 07/10/2014  . Difficult or painful urination 07/10/2014  . Decreased potassium in the blood 07/10/2014  . Panic attack 07/10/2014   Past Medical History:  Diagnosis Date  . Anxiety   . Chronic lower back pain   . COPD (chronic obstructive pulmonary disease) ([place])   . Depression   . Hypertension   . Panic disorder with agoraphobia and severe panic attacks   . Peripheral sensory neuropathy    Constitutional Exam  General appearance: Well nourished, well developed, and well hydrated. In no apparent acute distress Vitals:   01/12/17 1325  BP: (!) 138/91  Pulse: 78  Temp: 97.6 F (36.4 C)  Weight: 125 lb (56.7 kg)  Height: 5' 5"  (  1.651 m)   BMI Assessment: Estimated body mass index is 20.8 kg/m as calculated from the following:   Height as of this encounter: 5' 5"  (1.651 m).   Weight as of this encounter: 125 lb (56.7 kg).  BMI interpretation table: BMI level Category Range association with higher incidence of chronic pain  <18 kg/m2 Underweight   18.5-24.9 kg/m2 Ideal body weight   25-29.9  kg/m2 Overweight Increased incidence by 20%  30-34.9 kg/m2 Obese (Class I) Increased incidence by 68%  35-39.9 kg/m2 Severe obesity (Class II) Increased incidence by 136%  >40 kg/m2 Extreme obesity (Class III) Increased incidence by 254%   BMI Readings from Last 4 Encounters:  01/12/17 20.80 kg/m  01/05/17 19.05 kg/m  12/13/16 19.63 kg/m  11/15/16 19.37 kg/m   Wt Readings from Last 4 Encounters:  01/12/17 125 lb (56.7 kg)  01/05/17 125 lb 4.8 oz (56.8 kg)  12/13/16 129 lb 1.6 oz (58.6 kg)  11/15/16 127 lb 6.4 oz (57.8 kg)  Psych/Mental status: Alert, oriented x 3 (person, place, & time)       Eyes: PERLA Respiratory: No evidence of acute respiratory distress  Cervical Spine Exam  Inspection: No masses, redness, or swelling Alignment: Symmetrical Functional ROM: Unrestricted ROM      Stability: No instability detected Muscle strength & Tone: Functionally intact Sensory: Unimpaired Palpation: No palpable anomalies              Upper Extremity (UE) Exam    Side: Right upper extremity  Side: Left upper extremity  Inspection: No masses, redness, swelling, or asymmetry. No contractures  Inspection: No masses, redness, swelling, or asymmetry. No contractures  Functional ROM: Unrestricted ROM          Functional ROM: Unrestricted ROM          Muscle strength & Tone: Functionally intact  Muscle strength & Tone: Functionally intact  Sensory: Unimpaired  Sensory: Unimpaired  Palpation: No palpable anomalies              Palpation: No palpable anomalies              Specialized Test(s): Deferred         Specialized Test(s): Deferred          Thoracic Spine Exam  Inspection: No masses, redness, or swelling Alignment: Symmetrical Functional ROM: Unrestricted ROM Stability: No instability detected Sensory: Unimpaired Muscle strength & Tone: No palpable anomalies  Lumbar Spine Exam  Inspection: No masses, redness, or swelling Alignment: Symmetrical Functional ROM: Unrestricted  ROM      Stability: No instability detected Muscle strength & Tone: Functionally intact Sensory: Unimpaired Palpation: Complains of area being tender to palpation       Provocative Tests: Lumbar Hyperextension and rotation test: Positive bilaterally for facet joint pain. Patrick's Maneuver: Positive for bilateral S-I arthralgia              Gait & Posture Assessment  Ambulation: Unassisted Gait: Relatively normal for age and body habitus Posture: WNL   Lower Extremity Exam    Side: Right lower extremity  Side: Left lower extremity  Inspection: No masses, redness, swelling, or asymmetry. No contractures  Inspection: No masses, redness, swelling, or asymmetry. No contractures  Functional ROM: Unrestricted ROM          Functional ROM: Unrestricted ROM          Muscle strength & Tone: Functionally intact  Muscle strength & Tone: Functionally intact  Sensory: Unimpaired  Sensory: Unimpaired  Palpation:  No palpable anomalies  Palpation: No palpable anomalies   Assessment  Primary Diagnosis & Pertinent Problem List: The primary encounter diagnosis was Chronic bilateral low back pain with bilateral sciatica. Diagnoses of Chronic pain of both lower extremities, Chronic sacroiliac joint pain, Chronic pain syndrome, Long term current use of opiate analgesic, Disorder of bone, unspecified, Other long term (current) drug therapy, and Other specified health status were also pertinent to this visit.  Visit Diagnosis: 1. Chronic bilateral low back pain with bilateral sciatica   2. Chronic pain of both lower extremities   3. Chronic sacroiliac joint pain   4. Chronic pain syndrome   5. Long term current use of opiate analgesic   6. Disorder of bone, unspecified   7. Other long term (current) drug therapy   8. Other specified health status    Plan of Care  Initial treatment plan:  Please be advised that as per protocol, today's visit has been an evaluation only. We have not taken over the  patient's controlled substance management.  Problem-specific plan: No problem-specific Assessment & Plan notes found for this encounter.  Ordered Lab-work, Procedure(s), Referral(s), & Consult(s): Orders Placed This Encounter  Procedures  . DG Lumbar Spine Complete W/Bend  . DG Si Joints  . Compliance Drug Analysis, Ur  . Comp. Metabolic Panel (12)  . Magnesium  . Vitamin B12  . Sedimentation rate  . 25-Hydroxyvitamin D Lcms D2+D3  . C-reactive protein  . Ambulatory referral to Psychology   Pharmacotherapy: Medications ordered:  No orders of the defined types were placed in this encounter.  Medications administered during this visit: Jennipher S. Inglett had no medications administered during this visit.   Pharmacotherapy under consideration:  Opioid Analgesics: The patient was informed that there is no guarantee that she would be a candidate for opioid analgesics. The decision will be made following CDC guidelines. This decision will be based on the results of diagnostic studies, as well as Ms. Daughety's risk profile.  Membrane stabilizer: To be determined at a later time Muscle relaxant: To be determined at a later time NSAID: To be determined at a later time Other analgesic(s): To be determined at a later time   Interventional therapies under consideration: Ms. Shor was informed that there is no guarantee that she would be a candidate for interventional therapies. The decision will be based on the results of diagnostic studies, as well as Ms. Bobst's risk profile.  Possible procedure(s): Diagnostic bilateral LESI Diagnostic bilateral lumbar facet nerve block Possible bilateral lumbar RFA Diagnostic bilateral sacroiliac joint injection    Provider-requested follow-up: Return for 2nd Visit, w/ Dr. Dossie Arbour, after [facility] eval.  Future Appointments  Date Time Provider [place]  01/16/2017  1:00 PM Ursula Alert, MD [facility] None    Primary Care Physician: Roselee Nova, MD Location: [facility] Note by:  Date: 01/12/2017; Time: 3:41 PM  Pain Score Disclaimer: We use the NRS-11 scale. This is a self-reported, subjective measurement of pain severity with only modest accuracy. It is used primarily to identify changes within a particular patient. It must be understood that outpatient pain scales are significantly less accurate that those used for research, where they can be applied under ideal controlled circumstances with minimal exposure to variables. In reality, the score is likely to be a combination of pain intensity and pain affect, where pain affect describes the degree of emotional arousal or changes in action readiness caused by the sensory experience of pain. Factors  such as social and work situation, setting, emotional state, anxiety levels, expectation, and prior pain experience may influence pain perception and show large inter-individual differences that may also be affected by time variables.  Patient instructions provided during this appointment: Patient Instructions   You have been instructed to get labwork and xrays done today./  You have been informed that you must meet with a med psychologist for an appointment.  Please call and make a follow up appointment when all of these things are completed.  ____________________________________________________________________________________________  Appointment Policy Summary  It is our goal and responsibility to provide the medical community with assistance in the evaluation and management of patients with chronic pain. Unfortunately our resources are limited. Because we do not have an unlimited amount of time, or available appointments, we are required to closely monitor and manage their use. The following rules exist to maximize their use:  Patient's responsibilities: 1. Punctuality:  At what time should I arrive? You should be physically present in our office 30  minutes before your scheduled appointment. Your scheduled appointment is with your assigned healthcare provider. However, it takes 5-10 minutes to be "checked-in", and another 15 minutes for the nurses to do the admission. If you arrive to our office at the time you were given for your appointment, you will end up being at least 20-25 minutes late to your appointment with the provider. 2. Tardiness:  What happens if I arrive only a few minutes after my scheduled appointment time? You will need to reschedule your appointment. The cutoff is your appointment time. This is why it is so important that you arrive at least 30 minutes before that appointment. If you have an appointment scheduled for 10:00 AM and you arrive at 10:01, you will be required to reschedule your appointment.  3. Plan ahead:  Always assume that you will encounter traffic on your way in. Plan for it. If you are dependent on a driver, make sure they understand these rules and the need to arrive early. 4. Other appointments and responsibilities:  Avoid scheduling any other appointments before or after your pain clinic appointments.  5. Be prepared:  Write down everything that you need to discuss with your healthcare provider and give this information to the admitting nurse. Write down the medications that you will need refilled. Bring your pills and bottles (even the empty ones), to all of your appointments, except for those where a procedure is scheduled. 6. No children or pets:  Find someone to take care of them. It is not appropriate to bring them in. 7. Scheduling changes:  We request "advanced notification" of any changes or cancellations. 8. Advanced notification:  Defined as a time period of more than 24 hours prior to the originally scheduled appointment. This allows for the appointment to be offered to other patients. 9. Rescheduling:  When a visit is rescheduled, it will require the cancellation of the original appointment.  For this reason they both fall within the category of "Cancellations".  10. Cancellations:  They require advanced notification. Any cancellation less than 24 hours before the  appointment will be recorded as a "No Show". 11. No Show:  Defined as an unkept appointment where the patient failed to notify or declare to the practice their intention or inability to keep the appointment.  Corrective process for repeat offenders:  1. Tardiness: Three (3) episodes of rescheduling due to late arrivals will be recorded as one (1) "No Show". 2. Cancellation or reschedule: Three (3) cancellations  or rescheduling will be recorded as one (1) "No Show". 3. "No Shows": Three (3) "No Shows" within a 12 month period will result in discharge from the practice.  ____________________________________________________________________________________________   ____________________________________________________________________________________________  Pain Scale  Introduction: The pain score used by this practice is the Verbal Numerical Rating Scale (VNRS-11). This is an 11-point scale. It is for adults and children 10 years or older. There are significant differences in how the pain score is reported, used, and applied. Forget everything you learned in the past and learn this scoring system.  General Information: The scale should reflect your current level of pain. Unless you are specifically asked for the level of your worst pain, or your average pain. If you are asked for one of these two, then it should be understood that it is over the past 24 hours.  Basic Activities of Daily Living (ADL): Personal hygiene, dressing, eating, transferring, and using restroom.  Instructions: Most patients tend to report their level of pain as a combination of two factors, their physical pain and their psychosocial pain. This last one is also known as "suffering" and it is reflection of how physical pain affects you socially and  psychologically. From now on, report them separately. From this point on, when asked to report your pain level, report only your physical pain. Use the following table for reference.  Pain Clinic Pain Levels (0-5/10)  Pain Level Score  Description  No Pain 0   Mild pain 1 Nagging, annoying, but does not interfere with basic activities of daily living (ADL). Patients are able to eat, bathe, get dressed, toileting (being able to get on and off the toilet and perform personal hygiene functions), transfer (move in and out of bed or a chair without assistance), and maintain continence (able to control bladder and bowel functions). Blood pressure and heart rate are unaffected. A normal heart rate for a healthy adult ranges from 60 to 100 bpm (beats per minute).   Mild to moderate pain 2 Noticeable and distracting. Impossible to hide from other people. More frequent flare-ups. Still possible to adapt and function close to normal. It can be very annoying and may have occasional stronger flare-ups. With discipline, patients may get used to it and adapt.   Moderate pain 3 Interferes significantly with activities of daily living (ADL). It becomes difficult to feed, bathe, get dressed, get on and off the toilet or to perform personal hygiene functions. Difficult to get in and out of bed or a chair without assistance. Very distracting. With effort, it can be ignored when deeply involved in activities.   Moderately severe pain 4 Impossible to ignore for more than a few minutes. With effort, patients may still be able to manage work or participate in some social activities. Very difficult to concentrate. Signs of autonomic nervous system discharge are evident: dilated pupils (mydriasis); mild sweating (diaphoresis); sleep interference. Heart rate becomes elevated (>115 bpm). Diastolic blood pressure (lower number) rises above 100 mmHg. Patients find relief in laying down and not moving.   Severe pain 5 Intense and  extremely unpleasant. Associated with frowning face and frequent crying. Pain overwhelms the senses.  Ability to do any activity or maintain social relationships becomes significantly limited. Conversation becomes difficult. Pacing back and forth is common, as getting into a comfortable position is nearly impossible. Pain wakes you up from deep sleep. Physical signs will be obvious: pupillary dilation; increased sweating; goosebumps; brisk reflexes; cold, clammy hands and feet; nausea, vomiting or dry heaves;  loss of appetite; significant sleep disturbance with inability to fall asleep or to remain asleep. When persistent, significant weight loss is observed due to the complete loss of appetite and sleep deprivation.  Blood pressure and heart rate becomes significantly elevated. Caution: If elevated blood pressure triggers a pounding headache associated with blurred vision, then the patient should immediately seek attention at an urgent or emergency care unit, as these may be signs of an impending stroke.    Emergency Department Pain Levels (6-10/10)  Emergency Room Pain 6 Severely limiting. Requires emergency care and should not be seen or managed at an outpatient pain management facility. Communication becomes difficult and requires great effort. Assistance to reach the emergency department may be required. Facial flushing and profuse sweating along with potentially dangerous increases in heart rate and blood pressure will be evident.   Distressing pain 7 Self-care is very difficult. Assistance is required to transport, or use restroom. Assistance to reach the emergency department will be required. Tasks requiring coordination, such as bathing and getting dressed become very difficult.   Disabling pain 8 Self-care is no longer possible. At this level, pain is disabling. The individual is unable to do even the most "basic" activities such as walking, eating, bathing, dressing, transferring to a bed, or  toileting. Fine motor skills are lost. It is difficult to think clearly.   Incapacitating pain 9 Pain becomes incapacitating. Thought processing is no longer possible. Difficult to remember your own name. Control of movement and coordination are lost.   The worst pain imaginable 10 At this level, most patients pass out from pain. When this level is reached, collapse of the autonomic nervous system occurs, leading to a sudden drop in blood pressure and heart rate. This in turn results in a temporary and dramatic drop in blood flow to the brain, leading to a loss of consciousness. Fainting is one of the body's self defense mechanisms. Passing out puts the brain in a calmed state and causes it to shut down for a while, in order to begin the healing process.    Summary: 1. Refer to this scale when providing [place] with your pain level. 2. Be accurate and careful when reporting your pain level. This will help with your care. 3. Over-reporting your pain level will lead to loss of credibility. 4. Even a level of 1/10 means that there is pain and will be treated at our facility. 5. High, inaccurate reporting will be documented as "Symptom Exaggeration", leading to loss of credibility and suspicions of possible secondary gains such as obtaining more narcotics, or wanting to appear disabled, for fraudulent reasons. 6. Only pain levels of 5 or below will be seen at our facility. 7. Pain levels of 6 and above will be sent to the Emergency Department and the appointment cancelled. ____________________________________________________________________________________________

## 2017-01-12 NOTE — Addendum Note (Signed)
Addended by: Bud Face N on: 01/12/2017 04:22 PM   Modules accepted: Orders

## 2017-01-12 NOTE — Telephone Encounter (Signed)
Pt called because she was seen at pain management clinic today and they do not fill medications on first visit.  Pt states she is out of meds and wants to know if Dr Manuella Ghazi  Will filled those rxs   pregabalin (LYRICA) 75 MG capsule  HYDROcodone-acetaminophen (NORCO) 10-325 MG tablet  carisoprodol (SOMA) 350 MG tablet   alprazolam (XANAX) 2 MG tablet

## 2017-01-13 NOTE — Telephone Encounter (Signed)
Left voice message informing patient that she did have a prescription here ready for pick up

## 2017-01-13 NOTE — Telephone Encounter (Signed)
Pt calling to check if script is ready. Informed pt she would receive a call when its at the front desk

## 2017-01-16 ENCOUNTER — Ambulatory Visit: Payer: Self-pay | Admitting: Psychiatry

## 2017-01-16 NOTE — Telephone Encounter (Signed)
Copied from [place]. Topic: Quick Communication - See Telephone Encounter >> Jan 16, 2017  1:10 PM Hewitt Shorts wrote: CRM for notification. See Telephone encounter for:  Pt is needing to talk with someone about getting provider that covers all her needs including pain management  at one location  it is getting tooo expensive with several differen  copays  Best  [place]   01/16/17.

## 2017-01-17 LAB — 25-HYDROXY VITAMIN D LCMS D2+D3
25-Hydroxy, Vitamin D-2: <1 ng/mL
25-Hydroxy, Vitamin D-3: 39 ng/mL
25-Hydroxy, Vitamin D: 39 ng/mL

## 2017-01-17 LAB — COMP. METABOLIC PANEL (12)
A/G RATIO: 1.5 (ref 1.2–2.2)
AST: 21 IU/L (ref 0–40)
Albumin: 4.8 g/dL (ref 3.6–4.8)
Alkaline Phosphatase: 112 IU/L (ref 39–117)
BILIRUBIN TOTAL: 0.2 mg/dL (ref 0.0–1.2)
BUN / CREAT RATIO: 9 — AB (ref 12–28)
BUN: 5 mg/dL — ABNORMAL LOW (ref 8–27)
CREATININE: 0.54 mg/dL — AB (ref 0.57–1.00)
Calcium: 9.8 mg/dL (ref 8.7–10.3)
Chloride: 90 mmol/L — ABNORMAL LOW (ref 96–106)
GFR calc Af Amer: 119 mL/min/{1.73_m2} (ref >59)
GFR calc non Af Amer: 103 mL/min/{1.73_m2} (ref >59)
Globulin, Total: 3.3 g/dL (ref 1.5–4.5)
Glucose: 74 mg/dL (ref 65–99)
Potassium: 4.5 mmol/L (ref 3.5–5.2)
SODIUM: 130 mmol/L — AB (ref 134–144)
TOTAL PROTEIN: 8.1 g/dL (ref 6.0–8.5)

## 2017-01-17 LAB — COMPLIANCE DRUG ANALYSIS, UR

## 2017-01-17 LAB — MAGNESIUM: MAGNESIUM: 1.8 mg/dL (ref 1.6–2.3)

## 2017-01-17 LAB — VITAMIN B12: Vitamin B-12: >2000 pg/mL — ABNORMAL HIGH (ref 232–1245)

## 2017-01-17 LAB — SEDIMENTATION RATE: Sed Rate: 30 mm/hr (ref 0–40)

## 2017-01-17 LAB — C-REACTIVE PROTEIN: CRP: 21 mg/L — AB (ref 0.0–4.9)

## 2017-01-23 NOTE — Telephone Encounter (Signed)
Called and tried to leave a message. Not able to leave a message, which is unusually; I'm usaully able to leave a message. Also pt has spoken to [place] and [place] this issue. We tried giving pt some suggestions but pt states either she called and they are not going to prescribe those medications and or she can not go to far. If patient calls back please inform pt that she will most likely have to travel out the area if she is looking for a PCP to handle her pain medicines as well her regular care. She will have to call around to see who is accepting new patients and or willing to prescribed those meds.

## 2017-02-07 DIAGNOSIS — J449 Chronic obstructive pulmonary disease, unspecified: Secondary | ICD-10-CM | POA: Diagnosis not present

## 2017-02-08 ENCOUNTER — Other Ambulatory Visit: Payer: Self-pay

## 2017-02-08 ENCOUNTER — Encounter: Payer: Self-pay | Admitting: *Deleted

## 2017-02-08 ENCOUNTER — Encounter: Payer: Self-pay | Admitting: Family Medicine

## 2017-02-08 ENCOUNTER — Inpatient Hospital Stay: Payer: Medicare HMO

## 2017-02-08 ENCOUNTER — Inpatient Hospital Stay
Admission: EM | Admit: 2017-02-08 | Discharge: 2017-02-11 | DRG: 854 | Disposition: A | Discharge status: Home Health | Payer: Medicare HMO | Attending: Internal Medicine | Admitting: Internal Medicine

## 2017-02-08 ENCOUNTER — Emergency Department: Payer: Medicare HMO

## 2017-02-08 ENCOUNTER — Ambulatory Visit (INDEPENDENT_AMBULATORY_CARE_PROVIDER_SITE_OTHER): Payer: Medicare HMO | Admitting: Family Medicine

## 2017-02-08 VITALS — BP 116/72 | HR 97 | Temp 98.3°F | Resp 16 | Wt 127.1 lb

## 2017-02-08 DIAGNOSIS — Z801 Family history of malignant neoplasm of trachea, bronchus and lung: Secondary | ICD-10-CM | POA: Diagnosis not present

## 2017-02-08 DIAGNOSIS — E871 Hypo-osmolality and hyponatremia: Secondary | ICD-10-CM | POA: Diagnosis not present

## 2017-02-08 DIAGNOSIS — L03011 Cellulitis of right finger: Secondary | ICD-10-CM | POA: Diagnosis not present

## 2017-02-08 DIAGNOSIS — M545 Low back pain: Secondary | ICD-10-CM | POA: Diagnosis present

## 2017-02-08 DIAGNOSIS — L02511 Cutaneous abscess of right hand: Secondary | ICD-10-CM | POA: Diagnosis present

## 2017-02-08 DIAGNOSIS — I10 Essential (primary) hypertension: Secondary | ICD-10-CM | POA: Diagnosis not present

## 2017-02-08 DIAGNOSIS — Z880 Allergy status to penicillin: Secondary | ICD-10-CM | POA: Diagnosis not present

## 2017-02-08 DIAGNOSIS — G894 Chronic pain syndrome: Secondary | ICD-10-CM | POA: Diagnosis present

## 2017-02-08 DIAGNOSIS — L089 Local infection of the skin and subcutaneous tissue, unspecified: Secondary | ICD-10-CM | POA: Diagnosis not present

## 2017-02-08 DIAGNOSIS — F418 Other specified anxiety disorders: Secondary | ICD-10-CM | POA: Diagnosis present

## 2017-02-08 DIAGNOSIS — F4001 Agoraphobia with panic disorder: Secondary | ICD-10-CM | POA: Diagnosis present

## 2017-02-08 DIAGNOSIS — M7989 Other specified soft tissue disorders: Secondary | ICD-10-CM | POA: Diagnosis not present

## 2017-02-08 DIAGNOSIS — L03113 Cellulitis of right upper limb: Secondary | ICD-10-CM

## 2017-02-08 DIAGNOSIS — J449 Chronic obstructive pulmonary disease, unspecified: Secondary | ICD-10-CM | POA: Diagnosis present

## 2017-02-08 DIAGNOSIS — Z800 Family history of malignant neoplasm of digestive organs: Secondary | ICD-10-CM | POA: Diagnosis not present

## 2017-02-08 DIAGNOSIS — E785 Hyperlipidemia, unspecified: Secondary | ICD-10-CM | POA: Diagnosis present

## 2017-02-08 DIAGNOSIS — F329 Major depressive disorder, single episode, unspecified: Secondary | ICD-10-CM | POA: Diagnosis present

## 2017-02-08 DIAGNOSIS — A419 Sepsis, unspecified organism: Secondary | ICD-10-CM | POA: Diagnosis not present

## 2017-02-08 DIAGNOSIS — G608 Other hereditary and idiopathic neuropathies: Secondary | ICD-10-CM | POA: Diagnosis present

## 2017-02-08 DIAGNOSIS — F17210 Nicotine dependence, cigarettes, uncomplicated: Secondary | ICD-10-CM | POA: Diagnosis present

## 2017-02-08 DIAGNOSIS — M65041 Abscess of tendon sheath, right hand: Secondary | ICD-10-CM | POA: Diagnosis not present

## 2017-02-08 LAB — PROTIME-INR
INR: 0.92
Prothrombin Time: 12.3 seconds (ref 11.4–15.2)

## 2017-02-08 LAB — COMPREHENSIVE METABOLIC PANEL
ALK PHOS: 107 U/L (ref 38–126)
ALT: 16 U/L (ref 14–54)
AST: 22 U/L (ref 15–41)
Albumin: 4.5 g/dL (ref 3.5–5.0)
Anion gap: 10 (ref 5–15)
BUN: 7 mg/dL (ref 6–20)
CALCIUM: 9.5 mg/dL (ref 8.9–10.3)
CHLORIDE: 96 mmol/L — AB (ref 101–111)
CO2: 27 mmol/L (ref 22–32)
CREATININE: 0.53 mg/dL (ref 0.44–1.00)
GFR calc non Af Amer: >60 mL/min (ref >60)
GLUCOSE: 100 mg/dL — AB (ref 65–99)
Potassium: 4.2 mmol/L (ref 3.5–5.1)
SODIUM: 133 mmol/L — AB (ref 135–145)
Total Bilirubin: 0.4 mg/dL (ref 0.3–1.2)
Total Protein: 8.5 g/dL — ABNORMAL HIGH (ref 6.5–8.1)

## 2017-02-08 LAB — CBC WITH DIFFERENTIAL/PLATELET
BASOS PCT: 0 %
Basophils Absolute: 0.1 10*3/uL (ref 0–0.1)
EOS ABS: 0.1 10*3/uL (ref 0–0.7)
EOS PCT: 1 %
HCT: 39.3 % (ref 35.0–47.0)
Hemoglobin: 13.6 g/dL (ref 12.0–16.0)
LYMPHS ABS: 1.3 10*3/uL (ref 1.0–3.6)
Lymphocytes Relative: 9 %
MCH: 31.6 pg (ref 26.0–34.0)
MCHC: 34.7 g/dL (ref 32.0–36.0)
MCV: 91.1 fL (ref 80.0–100.0)
MONOS PCT: 9 %
Monocytes Absolute: 1.2 10*3/uL — ABNORMAL HIGH (ref 0.2–0.9)
Neutro Abs: 11.2 10*3/uL — ABNORMAL HIGH (ref 1.4–6.5)
Neutrophils Relative %: 81 %
PLATELETS: 450 10*3/uL — AB (ref 150–440)
RBC: 4.31 MIL/uL (ref 3.80–5.20)
RDW: 14.1 % (ref 11.5–14.5)
WBC: 13.9 10*3/uL — ABNORMAL HIGH (ref 3.6–11.0)

## 2017-02-08 LAB — LACTIC ACID, PLASMA: LACTIC ACID, VENOUS: 0.7 mmol/L (ref 0.5–1.9)

## 2017-02-08 LAB — PROCALCITONIN

## 2017-02-08 LAB — SEDIMENTATION RATE: SED RATE: 26 mm/h (ref 0–30)

## 2017-02-08 LAB — APTT: APTT: 38 s — AB (ref 24–36)

## 2017-02-08 MED ORDER — CARISOPRODOL 350 MG PO TABS
350.0000 mg | ORAL_TABLET | Freq: Three times a day (TID) | ORAL | Status: DC
  Administered 2017-02-08: 350 mg via ORAL

## 2017-02-08 MED ORDER — ENOXAPARIN SODIUM 40 MG/0.4ML ~~LOC~~ SOLN
40.0000 mg | SUBCUTANEOUS | Status: DC
  Administered 2017-02-08: 40 mg via SUBCUTANEOUS
  Filled 2017-02-08: qty 0.4

## 2017-02-08 MED ORDER — IOPAMIDOL (ISOVUE-370) INJECTION 76%
75.0000 mL | Freq: Once | INTRAVENOUS | Status: AC | PRN
  Administered 2017-02-08: 75 mL via INTRAVENOUS

## 2017-02-08 MED ORDER — DEXTROSE 5 % IV SOLN
2.0000 g | Freq: Once | INTRAVENOUS | Status: AC
  Administered 2017-02-08: 2 g via INTRAVENOUS
  Filled 2017-02-08: qty 2

## 2017-02-08 MED ORDER — VANCOMYCIN HCL IN DEXTROSE 1-5 GM/200ML-% IV SOLN
1000.0000 mg | Freq: Once | INTRAVENOUS | Status: DC
  Filled 2017-02-08: qty 200

## 2017-02-08 MED ORDER — ONDANSETRON HCL 4 MG/2ML IJ SOLN
4.0000 mg | Freq: Once | INTRAMUSCULAR | Status: AC
  Administered 2017-02-08: 4 mg via INTRAVENOUS
  Filled 2017-02-08: qty 2

## 2017-02-08 MED ORDER — AMLODIPINE BESYLATE 10 MG PO TABS: 10.0000 mg | ORAL_TABLET | Freq: Every day | ORAL | Status: DC

## 2017-02-08 MED ORDER — ACETAMINOPHEN 650 MG RE SUPP: 650.0000 mg | Freq: Four times a day (QID) | RECTAL | Status: DC | PRN

## 2017-02-08 MED ORDER — MORPHINE SULFATE (PF) 4 MG/ML IV SOLN
4.0000 mg | Freq: Once | INTRAVENOUS | Status: AC
  Administered 2017-02-08: 4 mg via INTRAVENOUS
  Filled 2017-02-08: qty 1

## 2017-02-08 MED ORDER — BISACODYL 5 MG PO TBEC: 5.0000 mg | DELAYED_RELEASE_TABLET | Freq: Every day | ORAL | Status: DC | PRN

## 2017-02-08 MED ORDER — ACETAMINOPHEN 325 MG PO TABS
650.0000 mg | ORAL_TABLET | Freq: Four times a day (QID) | ORAL | Status: DC | PRN
  Administered 2017-02-09: 650 mg via ORAL
  Filled 2017-02-08: qty 2

## 2017-02-08 MED ORDER — POTASSIUM CHLORIDE CRYS ER 10 MEQ PO TBCR: 10.0000 meq | EXTENDED_RELEASE_TABLET | Freq: Every day | ORAL | Status: DC

## 2017-02-08 MED ORDER — METRONIDAZOLE IN NACL 5-0.79 MG/ML-% IV SOLN
500.0000 mg | Freq: Once | INTRAVENOUS | Status: DC
  Filled 2017-02-08: qty 100

## 2017-02-08 MED ORDER — HYDROCODONE-ACETAMINOPHEN 5-325 MG PO TABS
1.0000 | ORAL_TABLET | ORAL | Status: DC | PRN
  Administered 2017-02-08: 2 via ORAL
  Filled 2017-02-08: qty 2

## 2017-02-08 MED ORDER — ALBUTEROL SULFATE (2.5 MG/3ML) 0.083% IN NEBU: 2.5000 mg | INHALATION_SOLUTION | RESPIRATORY_TRACT | Status: DC | PRN

## 2017-02-08 MED ORDER — ACETAMINOPHEN 500 MG PO TABS
1000.0000 mg | ORAL_TABLET | Freq: Once | ORAL | Status: AC
  Administered 2017-02-08: 1000 mg via ORAL
  Filled 2017-02-08: qty 2

## 2017-02-08 MED ORDER — KETOROLAC TROMETHAMINE 30 MG/ML IJ SOLN
30.0000 mg | Freq: Four times a day (QID) | INTRAMUSCULAR | Status: DC | PRN
  Administered 2017-02-08 – 2017-02-09 (×2): 30 mg via INTRAVENOUS
  Filled 2017-02-08 (×2): qty 1

## 2017-02-08 MED ORDER — SENNOSIDES-DOCUSATE SODIUM 8.6-50 MG PO TABS: 1.0000 | ORAL_TABLET | Freq: Every evening | ORAL | Status: DC | PRN

## 2017-02-08 MED ORDER — ONDANSETRON HCL 4 MG/2ML IJ SOLN: 4.0000 mg | Freq: Four times a day (QID) | INTRAMUSCULAR | Status: DC | PRN

## 2017-02-08 MED ORDER — VANCOMYCIN HCL IN DEXTROSE 750-5 MG/150ML-% IV SOLN
750.0000 mg | Freq: Two times a day (BID) | INTRAVENOUS | Status: DC
  Administered 2017-02-08: 750 mg via INTRAVENOUS
  Filled 2017-02-08 (×3): qty 150

## 2017-02-08 MED ORDER — SODIUM CHLORIDE 0.9 % IV SOLN
INTRAVENOUS | Status: DC
  Administered 2017-02-08: 21:00:00 via INTRAVENOUS

## 2017-02-08 MED ORDER — AZELASTINE HCL 0.1 % NA SOLN
1.0000 | Freq: Two times a day (BID) | NASAL | Status: DC
  Administered 2017-02-08: 1 via NASAL
  Filled 2017-02-08: qty 30

## 2017-02-08 MED ORDER — SODIUM CHLORIDE 0.9 % IV BOLUS (SEPSIS)
1000.0000 mL | Freq: Once | INTRAVENOUS | Status: AC
  Administered 2017-02-08: 1000 mL via INTRAVENOUS

## 2017-02-08 MED ORDER — ALPRAZOLAM 1 MG PO TABS
2.0000 mg | ORAL_TABLET | Freq: Three times a day (TID) | ORAL | Status: DC | PRN
  Administered 2017-02-08 – 2017-02-09 (×2): 2 mg via ORAL
  Filled 2017-02-08 (×2): qty 4

## 2017-02-08 MED ORDER — NICOTINE 14 MG/24HR TD PT24
14.0000 mg | MEDICATED_PATCH | Freq: Every day | TRANSDERMAL | Status: DC
  Filled 2017-02-08 (×2): qty 1

## 2017-02-08 MED ORDER — SODIUM CHLORIDE 0.9 % IV SOLN
1.0000 g | Freq: Three times a day (TID) | INTRAVENOUS | Status: DC
  Administered 2017-02-08 – 2017-02-09 (×2): 1 g via INTRAVENOUS
  Filled 2017-02-08 (×4): qty 1

## 2017-02-08 MED ORDER — PREGABALIN 75 MG PO CAPS
75.0000 mg | ORAL_CAPSULE | Freq: Three times a day (TID) | ORAL | Status: DC
  Administered 2017-02-08 – 2017-02-09 (×2): 75 mg via ORAL
  Filled 2017-02-08 (×2): qty 1

## 2017-02-08 MED ORDER — ONDANSETRON HCL 4 MG PO TABS: 4.0000 mg | ORAL_TABLET | Freq: Four times a day (QID) | ORAL | Status: DC | PRN

## 2017-02-08 MED ORDER — QUETIAPINE FUMARATE 25 MG PO TABS
100.0000 mg | ORAL_TABLET | Freq: Every day | ORAL | Status: DC
  Administered 2017-02-08: 100 mg via ORAL
  Filled 2017-02-08: qty 4

## 2017-02-08 NOTE — H&P (Addendum)
[facility] [place] NAME: Tamara Mcclure    MR#:  [id number]  DATE OF BIRTH:  December 03, 1955  DATE OF ADMISSION:  02/08/2017  PRIMARY CARE PHYSICIAN: Roselee Nova, MD   REQUESTING/REFERRING PHYSICIAN: Rudene Re, MD  CHIEF COMPLAINT:   Chief Complaint  Patient presents with  . Wound Infection   Left hand swelling and redness today. HISTORY OF PRESENT ILLNESS:  Tamara Mcclure  is a 62 y.o. female with a known history of multiple medical problems as below.  The patient presents the ED this morning with right thumb swelling, pain and bruise.  The right thumb has become significant swollen and erythema throughout the day.  She has worsening pain in her hand and arm.  The erythema spread to arm.  She was found septic with fever, tachycardia and leukocytosis.  He denies any trauma or insect bite.  PAST MEDICAL HISTORY:   Past Medical History:  Diagnosis Date  . Anxiety   . Chronic lower back pain   . COPD (chronic obstructive pulmonary disease) ([place])   . Depression   . Hypertension   . Panic disorder with agoraphobia and severe panic attacks   . Peripheral sensory neuropathy     PAST SURGICAL HISTORY:   Past Surgical History:  Procedure Laterality Date  . none      SOCIAL HISTORY:   Social History   Tobacco Use  . Smoking status: Current Every Day Smoker    Packs/day: 1.00    Years: 30.00    Pack years: 30.00    Types: Cigarettes  . Smokeless tobacco: Never Used  Substance Use Topics  . Alcohol use: No    FAMILY HISTORY:   Family History  Problem Relation Age of Onset  . Cancer Mother        Lung  . Cancer Father        pancreatic  . Breast cancer Neg Hx   . Ovarian cancer Neg Hx   . Colon cancer Neg Hx   . Diabetes Neg Hx   . Heart disease Neg Hx     DRUG ALLERGIES:   Allergies  Allergen Reactions  . Penicillins Rash    Has patient had a PCN reaction causing immediate rash, facial/tongue/throat  swelling, SOB or lightheadedness with hypotension: Yes Has patient had a PCN reaction causing severe rash involving mucus membranes or skin necrosis: No Has patient had a PCN reaction that required hospitalization: No Has patient had a PCN reaction occurring within the last 10 years: No If all of the above answers are "NO", then may proceed with Cephalosporin use.    REVIEW OF SYSTEMS:   Review of Systems  Constitutional: Negative for chills, fever and malaise/fatigue.  HENT: Negative for sore throat.   Eyes: Negative for blurred vision and double vision.  Respiratory: Negative for cough, hemoptysis, shortness of breath, wheezing and stridor.   Cardiovascular: Negative for chest pain, palpitations, orthopnea and leg swelling.  Gastrointestinal: Negative for abdominal pain, blood in stool, diarrhea, melena, nausea and vomiting.  Genitourinary: Negative for dysuria, flank pain and hematuria.  Musculoskeletal: Negative for back pain and joint pain.       Right hand bruise, swelling and erythema, progressed to right arm quickly.  Skin: Negative for rash.  Neurological: Negative for dizziness, sensory change, focal weakness, seizures, loss of consciousness, weakness and headaches.  Endo/Heme/Allergies: Negative for polydipsia.  Psychiatric/Behavioral: Negative for depression. The patient is not nervous/anxious.  MEDICATIONS AT HOME:   Prior to Admission medications   Medication Sig Start Date End Date Taking? Authorizing Provider  alprazolam Duanne Moron) 2 MG tablet Take 1 tablet (2 mg total) by mouth 3 (three) times daily as needed for anxiety. 01/12/17  Yes Rochel Brome A, MD  amLODipine (NORVASC) 10 MG tablet Take 1 tablet (10 mg total) by mouth daily. 09/22/16  Yes Rochel Brome A, MD  Azelastine HCl 0.15 % SOLN Place 1 spray into the nose 2 (two) times daily. 10/31/16 02/08/17 Yes Roselee Nova, MD  carisoprodol (SOMA) 350 MG tablet Take 1 tablet (350 mg total) by mouth 3 (three)  times daily. 01/12/17  Yes Keith Rake Asad A, MD  COMBIVENT RESPIMAT 20-100 MCG/ACT AERS respimat INHALE 1 PUFF INTO THE LUNGS EVERY 6 HOURS AS NEEDED FOR WHEEZING OR SHORTNESS OF BREATH 10/16/15  Yes Keith Rake Asad A, MD  fluticasone furoate-vilanterol (BREO ELLIPTA) 100-25 MCG/INH AEPB Inhale 1 puff into the lungs daily. 11/26/15  Yes Roselee Nova, MD  HYDROcodone-acetaminophen (NORCO) 10-325 MG tablet Take 1 tablet by mouth every 6 (six) hours as needed. 01/12/17  Yes Roselee Nova, MD  potassium chloride (K-DUR) 10 MEQ tablet TAKE 1 TABLET BY MOUTH ONCE A DAY 07/16/15  Yes Roselee Nova, MD  pregabalin (LYRICA) 75 MG capsule Take 1 capsule (75 mg total) by mouth 3 (three) times daily. 01/12/17  Yes Roselee Nova, MD  QUEtiapine (SEROQUEL) 100 MG tablet Take 1 tablet (100 mg total) by mouth at bedtime. 12/13/16  Yes Roselee Nova, MD  albuterol (PROVENTIL HFA;VENTOLIN HFA) 108 (90 Base) MCG/ACT inhaler Inhale 2 puffs into the lungs every 6 (six) hours as needed for wheezing or shortness of breath. 11/16/15   Roselee Nova, MD  ibuprofen (ADVIL,MOTRIN) 200 MG tablet Take 200 mg by mouth every 6 (six) hours as needed for mild pain.     [provider]      VITAL SIGNS:  Blood pressure (!) 157/83, pulse (!) 112, temperature 98.7 F (37.1 C), temperature source Oral, resp. rate 18, height 5\' 5"  (1.651 m), weight 127 lb (57.6 kg), SpO2 100 %.  PHYSICAL EXAMINATION:  Physical Exam  GENERAL:  62 y.o.-year-old patient lying in the bed with no acute distress.  EYES: Pupils equal, round, reactive to light and accommodation. No scleral icterus. Extraocular muscles intact.  HEENT: Head atraumatic, normocephalic. Oropharynx and nasopharynx clear.  NECK:  Supple, no jugular venous distention. No thyroid enlargement, no tenderness.  LUNGS: Normal breath sounds bilaterally, no wheezing, rales,rhonchi or crepitation. No use of accessory muscles of respiration.  CARDIOVASCULAR: S1,  S2 normal. No murmurs, rubs, or gallops.  ABDOMEN: Soft, nontender, nondistended. Bowel sounds present. No organomegaly or mass.  EXTREMITIES: No pedal edema, cyanosis, or clubbing. Right hand bruise, swelling and erythema, progressed to upper right arm. NEUROLOGIC: Cranial nerves II through XII are intact. Muscle strength 5/5 in all extremities. Sensation intact. Gait not checked.  PSYCHIATRIC: The patient is alert and oriented x 3.  SKIN: No obvious rash, lesion, or ulcer.   LABORATORY PANEL:   CBC Recent Labs  Lab 02/08/17 1554  WBC 13.9*  HGB 13.6  HCT 39.3  PLT 450*   ------------------------------------------------------------------------------------------------------------------  Chemistries  Recent Labs  Lab 02/08/17 1554  NA 133*  K 4.2  CL 96*  CO2 27  GLUCOSE 100*  BUN 7  CREATININE 0.53  CALCIUM 9.5  AST 22  ALT  16  ALKPHOS 107  BILITOT 0.4   ------------------------------------------------------------------------------------------------------------------  Cardiac Enzymes No results for input(s): TROPONINI in the last 168 hours. ------------------------------------------------------------------------------------------------------------------  RADIOLOGY:  Dg Hand Complete Right  Result Date: 02/08/2017 CLINICAL DATA:  Cellulitis, swelling EXAM: RIGHT HAND - COMPLETE 3+ VIEW COMPARISON:  None. FINDINGS: Mild joint space narrowing in the IP joints. Early spurring. No acute bony abnormality. Specifically, no fracture, subluxation, or dislocation. Soft tissues unremarkable. IMPRESSION: No acute bony abnormality. Electronically Signed   By: Rolm Baptise M.D.   On: 02/08/2017 18:33      IMPRESSION AND PLAN:   Sepsis due to right hand and arm cellulitis. The patient will be admitted to medical floor. Sepsis protocol, start antibiotics pharmacy to dose, follow-up CBC and cultures. N.p.o. after midnight for possible surgery per Dr. Harlow Mares.  Hyponatremia.   Start normal saline IV and follow-up BMP. COPD.  Stable, nebulizer as needed. Hypertension.  Continue Norvasc. Anxiety.  Continue home medication. Tobacco abuse.  Smoking cessation was counseled for 3-4 minutes.  Nicotine patch.  Discussed with orthopedic surgeon Dr. Harlow Mares. All the records are reviewed and case discussed with ED provider. Management plans discussed with the patient, family and they are in agreement.  CODE STATUS: Full Code  TOTAL TIME TAKING CARE OF THIS PATIENT: 55 minutes.    Demetrios Loll M.D on 02/08/2017 at 6:36 PM  Between 7am to 6pm - Pager - [phone]  After 6pm go to [email or web address] - [employer] [facility]  Office  [phone]  CC: Primary care physician; Roselee Nova, MD   Note: This dictation was prepared with Dragon dictation along with smaller phrase technology. Any transcriptional errors that result from this process are unin

## 2017-02-08 NOTE — Progress Notes (Signed)
Name: Tamara Mcclure   MRN: [id number]    DOB: [birth year]   Date:02/08/2017       Progress Note  Subjective  Chief Complaint  Chief Complaint  Patient presents with  . Belepharitis    Right thumb swollen, hot to the touch red. Happen when she was sleep. Rash and hot patches on her right arm.     HPI  Pt. Presents for a rash that started last night and got worse this AM, it started with swelling of right thumb last night, then swelling and redness extended to her hand and distal arm, she denies any fevers, chills, weight loss, not sure if she had an insect bite, bending the arm makes it worse.  She denies any cat or dog bite.   Past Medical History:  Diagnosis Date  . Anxiety   . Chronic lower back pain   . COPD (chronic obstructive pulmonary disease) ([place])   . Depression   . Hypertension   . Panic disorder with agoraphobia and severe panic attacks   . Peripheral sensory neuropathy     Past Surgical History:  Procedure Laterality Date  . none      Family History  Problem Relation Age of Onset  . Cancer Mother        Lung  . Cancer Father        pancreatic  . Breast cancer Neg Hx   . Ovarian cancer Neg Hx   . Colon cancer Neg Hx   . Diabetes Neg Hx   . Heart disease Neg Hx     Social History   Socioeconomic History  . Marital status: Single    Spouse name: Not on file  . Number of children: Not on file  . Years of education: Not on file  . Highest education level: Not on file  Social Needs  . Financial resource strain: Not on file  . Food insecurity - worry: Not on file  . Food insecurity - inability: Not on file  . Transportation needs - medical: Not on file  . Transportation needs - non-medical: Not on file  Occupational History  . Not on file  Tobacco Use  . Smoking status: Current Every Day Smoker    Packs/day: 1.00    Years: 30.00    Pack years: 30.00    Types: Cigarettes  . Smokeless tobacco: Never Used  Substance and Sexual Activity  .  Alcohol use: No  . Drug use: No  . Sexual activity: Not Currently  Other Topics Concern  . Not on file  Social History Narrative  . Not on file     Current Outpatient Medications:  .  albuterol (PROVENTIL HFA;VENTOLIN HFA) 108 (90 Base) MCG/ACT inhaler, Inhale 2 puffs into the lungs every 6 (six) hours as needed for wheezing or shortness of breath., Disp: 1 Inhaler, Rfl: 1 .  alprazolam (XANAX) 2 MG tablet, Take 1 tablet (2 mg total) by mouth 3 (three) times daily as needed for anxiety., Disp: 90 tablet, Rfl: 0 .  amLODipine (NORVASC) 10 MG tablet, Take 1 tablet (10 mg total) by mouth daily., Disp: 90 tablet, Rfl: 1 .  Azelastine HCl 0.15 % SOLN, Place 1 spray into the nose 2 (two) times daily., Disp: 1 mL, Rfl: 0 .  carisoprodol (SOMA) 350 MG tablet, Take 1 tablet (350 mg total) by mouth 3 (three) times daily., Disp: 90 tablet, Rfl: 0 .  COMBIVENT RESPIMAT 20-100 MCG/ACT AERS respimat, INHALE 1 PUFF INTO THE LUNGS EVERY  6 HOURS AS NEEDED FOR WHEEZING OR SHORTNESS OF BREATH, Disp: 4 g, Rfl: 2 .  fluticasone furoate-vilanterol (BREO ELLIPTA) 100-25 MCG/INH AEPB, Inhale 1 puff into the lungs daily., Disp: 3 each, Rfl: 1 .  HYDROcodone-acetaminophen (NORCO) 10-325 MG tablet, Take 1 tablet by mouth every 6 (six) hours as needed., Disp: 120 tablet, Rfl: 0 .  ibuprofen (ADVIL,MOTRIN) 200 MG tablet, Take 200 mg by mouth every 6 (six) hours as needed for mild pain. , Disp: , Rfl:  .  potassium chloride (K-DUR) 10 MEQ tablet, TAKE 1 TABLET BY MOUTH ONCE A DAY, Disp: 90 tablet, Rfl: 1 .  pregabalin (LYRICA) 75 MG capsule, Take 1 capsule (75 mg total) by mouth 3 (three) times daily., Disp: 90 capsule, Rfl: 0 .  QUEtiapine (SEROQUEL) 100 MG tablet, Take 1 tablet (100 mg total) by mouth at bedtime., Disp: 90 tablet, Rfl: 1  Allergies  Allergen Reactions  . Penicillins Rash and Other (See Comments)    .penallergy .penallergy     ROS  Please see HPI for complete discussion of  ROS  Objective  Vitals:   02/08/17 1354  BP: 116/72  Pulse: 97  Resp: 16  Temp: 98.3 F (36.8 C)  TempSrc: Oral  SpO2: 94%  Weight: 127 lb 1.6 oz (57.7 kg)    Physical Exam  Constitutional: She is well-developed, well-nourished, and in no distress.  Musculoskeletal:       Right upper arm: She exhibits tenderness and swelling.       Right forearm: She exhibits tenderness and swelling.       Arms:      Right hand: She exhibits tenderness and swelling. She exhibits no bony tenderness.       Hands: Nursing note and vitals reviewed.    Assessment & Plan  1. Cellulitis of right upper arm Possible Lymphangitis in the differential especially with infection spreading down the arm. DVT also in differential although less likely. Advised pt. To proceed to the ER right away for proper evaluation and treatment.    Fama Muenchow Asad [facility] 02/08/2017 2:07 PM

## 2017-02-08 NOTE — ED Provider Notes (Signed)
[facility] Emergency Department Provider Note  ____________________________________________  Time seen: Approximately 5:19 PM  I have reviewed the triage vital signs and the nursing notes.   HISTORY   Chief Complaint Wound Infection   HPI Tamara Mcclure is a 62 y.o. female with a history of COPD, hypertension, neuropathy who presents for evaluation of pain and swelling of her right thumb. Patient reports that she woke up this morning and noticed a bruise on her right thumb. Throughout the day the thumb has become significant swollen, she has severe throbbing pain that is constant and worse with movement of the thumb and the redness has spread up to her arm.She denies any trauma, known insect bite, h/o DM or immunosuppressions, fever but does endorse chills and body aches. No farming, gardening or pets.   Past Medical History:  Diagnosis Date  . Anxiety   . Chronic lower back pain   . COPD (chronic obstructive pulmonary disease) ([place])   . Depression   . Hypertension   . Panic disorder with agoraphobia and severe panic attacks   . Peripheral sensory neuropathy     Patient Active Problem List   Diagnosis Date Noted  . Sepsis ([place]) 02/08/2017  . Chronic low back pain (Primary Area of Pain) (Bilateral) (R>L) 01/12/2017  . Chronic pain of lower extremity (Secondary Area of Pain) (B (R>L) 01/12/2017  . Chronic pain syndrome 01/12/2017  . Long term current use of opiate analgesic 01/12/2017  . Disorder of bone, unspecified 01/12/2017  . Other long term (current) drug therapy 01/12/2017  . Other specified health status 01/12/2017  . Chronic sacroiliac joint pain 01/12/2017  . Post-nasal drainage 10/31/2016  . Joint stiffness of hand 04/07/2016  . URI with cough and congestion 01/19/2016  . Discoloration of skin of finger 11/26/2015  . Need for home health care 07/20/2015  . Decreased vision 07/20/2015  . Acute sinusitis 07/16/2015  . Bilateral leg  pain 06/18/2015  . Pain of left breast 05/21/2015  . Lesion of skin of face 12/25/2014  . Postprandial abdominal bloating 11/11/2014  . COPD (chronic obstructive pulmonary disease) ([place]) 11/03/2014  . Nasal sinus congestion 11/03/2014  . Hypertension 09/02/2014  . Dry skin 09/02/2014  . Menopausal symptoms 08/05/2014  . Peripheral sensory neuropathy 07/10/2014  . Anxiety and depression 07/10/2014  . Airway hyperreactivity 07/10/2014  . At risk for falling 07/10/2014  . Lumbosacral spondylosis 07/10/2014  . DDD (degenerative disc disease), lumbosacral 07/10/2014  . Discharge of ear 07/10/2014  . Degenerative arthritis of lumbar spine 07/10/2014  . Hyperlipidemia 07/10/2014  . Dysfunction of eustachian tube 07/10/2014  . ANA positive 07/10/2014  . Hypomagnesemia 07/10/2014  . Hypokalemia 07/10/2014  . Cerumen impaction 07/10/2014  . Cramps of lower extremity 07/10/2014  . Leg swelling 07/10/2014  . Spasm 07/10/2014  . Compulsive tobacco user syndrome 07/10/2014  . Atrophy of vagina 07/10/2014  . Chronic radicular low back pain 06/11/2014  . Insomnia 06/11/2014  . Panic disorder with agoraphobia and severe panic attacks 06/11/2014  . Foot pain 12/17/2013  . Burning sensation of feet 12/17/2013  . Numbness and tingling 12/17/2013  . CAFL (chronic airflow limitation) ([place]) 06/21/2011  . Clinical depression 06/21/2011    Past Surgical History:  Procedure Laterality Date  . none      Prior to Admission medications   Medication Sig Start Date End Date Taking? Authorizing Provider  alprazolam Duanne Moron) 2 MG tablet Take 1 tablet (2 mg total) by mouth 3 (three) times daily  as needed for anxiety. 01/12/17  Yes Rochel Brome A, MD  amLODipine (NORVASC) 10 MG tablet Take 1 tablet (10 mg total) by mouth daily. 09/22/16  Yes Rochel Brome A, MD  Azelastine HCl 0.15 % SOLN Place 1 spray into the nose 2 (two) times daily. 10/31/16 02/08/17 Yes Roselee Nova, MD  carisoprodol (SOMA)  350 MG tablet Take 1 tablet (350 mg total) by mouth 3 (three) times daily. 01/12/17  Yes Keith Rake Asad A, MD  COMBIVENT RESPIMAT 20-100 MCG/ACT AERS respimat INHALE 1 PUFF INTO THE LUNGS EVERY 6 HOURS AS NEEDED FOR WHEEZING OR SHORTNESS OF BREATH 10/16/15  Yes Keith Rake Asad A, MD  fluticasone furoate-vilanterol (BREO ELLIPTA) 100-25 MCG/INH AEPB Inhale 1 puff into the lungs daily. 11/26/15  Yes Roselee Nova, MD  HYDROcodone-acetaminophen (NORCO) 10-325 MG tablet Take 1 tablet by mouth every 6 (six) hours as needed. 01/12/17  Yes Roselee Nova, MD  potassium chloride (K-DUR) 10 MEQ tablet TAKE 1 TABLET BY MOUTH ONCE A DAY 07/16/15  Yes Roselee Nova, MD  pregabalin (LYRICA) 75 MG capsule Take 1 capsule (75 mg total) by mouth 3 (three) times daily. 01/12/17  Yes Roselee Nova, MD  QUEtiapine (SEROQUEL) 100 MG tablet Take 1 tablet (100 mg total) by mouth at bedtime. 12/13/16  Yes Roselee Nova, MD  albuterol (PROVENTIL HFA;VENTOLIN HFA) 108 (90 Base) MCG/ACT inhaler Inhale 2 puffs into the lungs every 6 (six) hours as needed for wheezing or shortness of breath. 11/16/15   Roselee Nova, MD  ibuprofen (ADVIL,MOTRIN) 200 MG tablet Take 200 mg by mouth every 6 (six) hours as needed for mild pain.     [provider]    Allergies Penicillins  Family History  Problem Relation Age of Onset  . Cancer Mother        Lung  . Cancer Father        pancreatic  . Breast cancer Neg Hx   . Ovarian cancer Neg Hx   . Colon cancer Neg Hx   . Diabetes Neg Hx   . Heart disease Neg Hx     Social History Social History   Tobacco Use  . Smoking status: Current Every Day Smoker    Packs/day: 1.00    Years: 30.00    Pack years: 30.00    Types: Cigarettes  . Smokeless tobacco: Never Used  Substance Use Topics  . Alcohol use: No  . Drug use: No    Review of Systems  Constitutional: + fever. Eyes: Negative for visual changes. ENT: Negative for sore throat. Neck: No neck pain   Cardiovascular: Negative for chest pain. Respiratory: Negative for shortness of breath. Gastrointestinal: Negative for abdominal pain, vomiting or diarrhea. Genitourinary: Negative for dysuria. Musculoskeletal: Negative for back pain. + R thumb swelling and erythema Skin: Negative for rash. Neurological: Negative for headaches, weakness or numbness. Psych: No SI or HI  ____________________________________________   PHYSICAL EXAM:  VITAL SIGNS: ED Triage Vitals  Enc Vitals Group     BP 02/08/17 1550 (!) 174/86     Pulse Rate 02/08/17 1550 (!) 111     Resp 02/08/17 1550 18     Temp 02/08/17 1550 (!) 100.5 F (38.1 C)     Temp Source 02/08/17 1550 Oral     SpO2 02/08/17 1550 100 %     Weight 02/08/17 1549 127 lb (57.6 kg)     Height 02/08/17  1549 5\' 5"  (1.651 m)     Head Circumference --      Peak Flow --      Pain Score --      Pain Loc --      Pain Edu? --      Excl. in [place]? --     Constitutional: Alert and oriented. Well appearing and in no apparent distress. HEENT:      Head: Normocephalic and atraumatic.         Eyes: Conjunctivae are normal. Sclera is non-icteric.       Mouth/Throat: Mucous membranes are moist.       Neck: Supple with no signs of meningismus. Cardiovascular: tachycardic with regular rhythm. No murmurs, gallops, or rubs. 2+ symmetrical distal pulses are present in all extremities. No JVD. Respiratory: Normal respiratory effort. Lungs are clear to auscultation bilaterally. No wheezes, crackles, or rhonchi.  Gastrointestinal: Soft, non tender, and non distended with positive bowel sounds. No rebound or guarding. Musculoskeletal: R thumb is swollen, erythematous with erythema and ttp extending over the flexor tendon, pain with flexion and extension of the digit, erythema and warmth extend all the way to proximal humerus area in the volar surface Neurologic: Normal speech and language. Face is symmetric. Moving all extremities. No gross focal neurologic  deficits are appreciated. Skin: Skin is warm, dry and intact. No rash noted. Psychiatric: Mood and affect are normal. Speech and behavior are normal.          ____________________________________________   LABS (all labs ordered are listed, but only abnormal results are displayed)  Labs Reviewed  COMPREHENSIVE METABOLIC PANEL - Abnormal; Notable for the following components:      Result Value   Sodium 133 (*)    Chloride 96 (*)    Glucose, Bld 100 (*)    Total Protein 8.5 (*)    All other components within normal limits  CBC WITH DIFFERENTIAL/PLATELET - Abnormal; Notable for the following components:   WBC 13.9 (*)    Platelets 450 (*)    Neutro Abs 11.2 (*)    Monocytes Absolute 1.2 (*)    All other components within normal limits  APTT - Abnormal; Notable for the following components:   aPTT 38 (*)    All other components within normal limits  CULTURE, BLOOD (ROUTINE X 2)  CULTURE, BLOOD (ROUTINE X 2)  LACTIC ACID, PLASMA  SEDIMENTATION RATE  PROCALCITONIN  PROTIME-INR  BASIC METABOLIC PANEL  CBC  HIV ANTIBODY (ROUTINE TESTING)   ____________________________________________  EKG  none  ____________________________________________  RADIOLOGY  Interpreted by me: XR R hand: No gas.  CT hand: Small abscess, no gas   Interpretation by Radiologist:  Ct Hand Right W Contrast  Result Date: 02/08/2017 CLINICAL DATA:  Pt to ED reporting she woke up this morning and noted that her right thumb was stiff. Later she noted a bruised area and redness and swelling in right hand. pT now has a red streak running up right arm that reaches the upper right arm. EXAM: CT OF THE UPPER RIGHT EXTREMITY WITH CONTRAST TECHNIQUE: Multidetector CT imaging of the upper right extremity was performed according to the standard protocol following intravenous contrast administration. COMPARISON:  None. CONTRAST:  23mL ISOVUE-370 IOPAMIDOL (ISOVUE-370) INJECTION 76% FINDINGS:  Bones/Joint/Cartilage No osseous fracture or dislocation seen. No destructive change or focal demineralization to suggest osteomyelitis. Ligaments Suboptimally assessed by CT. Muscles and Tendons No intramuscular fluid collection or abscess collection seen. No obvious tendon disruption. Soft tissues Edema  within the subcutaneous soft tissues overlying the distal phalanx of the right thumb. Tiny hypodense focus that appears to be contiguous with the skin surface overlying the tuft of the distal phalanx may represent a small abscess, measuring approximately 4 mm greatest dimension (series 7, image 70; series 8, image 65). No soft tissue gas identified. IMPRESSION: 1. Ill-defined edema within the subcutaneous soft tissues overlying the distal phalanx of the right thumb. Tiny hypodense focus within the superficial soft tissues overlying the tuft of the distal phalanx is suspicious for small abscess, likely contiguous with the skin surface, measuring approximately 4 mm greatest dimension. 2. No soft tissue gas identified. 3. No evidence of osteomyelitis. 4. Consider MRI for more definitive characterization of the soft tissues of the right hand and arm. These results will be called to the ordering clinician or representative by the Radiologist Assistant, and communication documented in the PACS or zVision Dashboard. Electronically Signed   By: Franki Cabot M.D.   On: 02/08/2017 20:44   Dg Hand Complete Right  Result Date: 02/08/2017 CLINICAL DATA:  Cellulitis, swelling EXAM: RIGHT HAND - COMPLETE 3+ VIEW COMPARISON:  None. FINDINGS: Mild joint space narrowing in the IP joints. Early spurring. No acute bony abnormality. Specifically, no fracture, subluxation, or dislocation. Soft tissues unremarkable. IMPRESSION: No acute bony abnormality. Electronically Signed   By: Rolm Baptise M.D.   On: 02/08/2017 18:33   ____________________________________________   PROCEDURES  Procedure(s) performed:  None Procedures Critical Care performed: yes  CRITICAL CARE Performed by: Rudene Re  ?  Total critical care time: 40 min  Critical care time was exclusive of separately billable procedures and treating other patients.  Critical care was necessary to treat or prevent imminent or life-threatening deterioration.  Critical care was time spent personally by me on the following activities: development of treatment plan with patient and/or surrogate as well as nursing, discussions with consultants, evaluation of patient's response to treatment, examination of patient, obtaining history from patient or surrogate, ordering and performing treatments and interventions, ordering and review of laboratory studies, ordering and review of radiographic studies, pulse oximetry and re-evaluation of patient's condition.  ____________________________________________   INITIAL IMPRESSION / ASSESSMENT AND PLAN / ED COURSE   62 y.o. female with a history of COPD, hypertension, neuropathy who presents for evaluation of pain and swelling of her right thumb.patient with a very rapid spreading infection that started in her right thumb and has now spread all the way to her proximal right arm, she has a fever and is tachycardic with a white count of 14 consistent with sepsis. There is no crepitus on my exam and no fluctuance. X-ray is pending to evaluate for air. I have discussed with Dr. Harlow Mares who has looked up patient's picture that have been placed on Epic and is on his way to evaluate patient for possible OR clean up and exploration. At this time I am concerned for a rapid expanding infection and flexor tenosynovitis of the R thumb. Patient was started on sepsis protocol with broad-spectrum antibiotics which included Estring on exam, Flagyl, and vancomycin. Blood cultures are pending. Her lactic is normal.      As part of my medical decision making, I reviewed the following data within the [place] notes reviewed and incorporated, Labs reviewed , Radiograph reviewed , Discussed with admitting physician , A consult was requested and obtained from this/these consultant(s) Orthopedics, Notes from prior ED visits and [place] Controlled Substance Database    Pertinent  labs & imaging results that were available during my care of the patient were reviewed by me and considered in my medical decision making (see chart for details).    ____________________________________________   FINAL CLINICAL IMPRESSION(S) / ED DIAGNOSES  Final diagnoses:  Sepsis, due to unspecified organism ([place])  Cellulitis of finger of right hand      NEW MEDICATIONS STARTED DURING THIS VISIT:  ED Discharge Orders    None       Note:  This document was prepared using Dragon voice recognition software and may include unintentional dictation errors.    Alfred Levins, [place], MD 02/09/17 Berniece Salines

## 2017-02-08 NOTE — Progress Notes (Signed)
CODE SEPSIS - PHARMACY COMMUNICATION  **Broad Spectrum Antibiotics should be administered within 1 hour of Sepsis diagnosis**  Time Code Sepsis Called/Page Received: 1749  Antibiotics Ordered: aztreonam, vancomycin, metronidazole, merrem  Time of 1st antibiotic administration: 1904  Additional action taken by pharmacy: none  If necessary, Name of Provider/Nurse Contacted: none     Thomasenia Sales ,PharmD Clinical Pharmacist  02/08/2017  6:46 PM

## 2017-02-08 NOTE — Progress Notes (Signed)
Pharmacy Antibiotic Note  Tamara Mcclure is a 63 y.o. female admitted on 02/08/2017 with cellulitis.  Pharmacy has been consulted for vancomycin dosing.  Plan: Vancomycin 750mg  IV every 12 hours.  Goal trough 10-15 mcg/mL. Vancomycin trough 2/1@1030   Merrem 1gm iv q8h Height: 5\' 5"  (165.1 cm) Weight: 127 lb (57.6 kg) IBW/kg (Calculated) : 57  Temp (24hrs), Avg:99.2 F (37.3 C), Min:98.3 F (36.8 C), Max:100.5 F (38.1 C)  Recent Labs  Lab 02/08/17 1554  WBC 13.9*  CREATININE 0.53  LATICACIDVEN 0.7    Estimated Creatinine Clearance: 67.3 mL/min (by C-G formula based on SCr of 0.53 mg/dL).    Allergies  Allergen Reactions  . Penicillins Rash    Has patient had a PCN reaction causing immediate rash, facial/tongue/throat swelling, SOB or lightheadedness with hypotension: Yes Has patient had a PCN reaction causing severe rash involving mucus membranes or skin necrosis: No Has patient had a PCN reaction that required hospitalization: No Has patient had a PCN reaction occurring within the last 10 years: No If all of the above answers are "NO", then may proceed with Cephalosporin use.    Antimicrobials this admission: Anti-infectives (From admission, onward)   Start     Dose/Rate Route Frequency Ordered Stop   02/08/17 2300  vancomycin (VANCOCIN) IVPB 750 mg/150 ml premix     750 mg 150 mL/hr over 60 Minutes Intravenous Every 12 hours 02/08/17 1816     02/08/17 1730  aztreonam (AZACTAM) 2 g in dextrose 5 % 50 mL IVPB     2 g 100 mL/hr over 30 Minutes Intravenous  Once 02/08/17 1715     02/08/17 1730  metroNIDAZOLE (FLAGYL) IVPB 500 mg     500 mg 100 mL/hr over 60 Minutes Intravenous  Once 02/08/17 1715     02/08/17 1730  vancomycin (VANCOCIN) IVPB 1000 mg/200 mL premix     1,000 mg 200 mL/hr over 60 Minutes Intravenous  Once 02/08/17 1715        Microbiology results: No results found for this or any previous visit (from the past 240 hour(s)).  Thank you for allowing  pharmacy to be a part of this patient's care.  Donna Christen Lee Kuang 02/08/2017 6:16 PM

## 2017-02-08 NOTE — ED Triage Notes (Signed)
Pt to ED reporting she woke up this mornign and noted that her right thumb was stiff. Later she noted a bruised area and redness and swelling in right hand. pT now has a red streak running up right arm that reaches the upper right arm. Pt denies knowing she had a fever but is febrile in triage and tachycardic.   Pt denies knowing if something bit her and denies trauma to hand.

## 2017-02-08 NOTE — ED Notes (Signed)
CODE SEPSIS CALLED TO DOUG AT [facility] 

## 2017-02-09 ENCOUNTER — Inpatient Hospital Stay: Payer: Medicare HMO | Admitting: Anesthesiology

## 2017-02-09 ENCOUNTER — Encounter
Admission: EM | Disposition: A | Discharge status: Home Health | Payer: Self-pay | Source: Home / Self Care | Attending: Internal Medicine

## 2017-02-09 ENCOUNTER — Encounter: Payer: Self-pay | Admitting: Anesthesiology

## 2017-02-09 ENCOUNTER — Other Ambulatory Visit: Payer: Self-pay

## 2017-02-09 HISTORY — PX: I & D EXTREMITY: SHX5045

## 2017-02-09 LAB — BASIC METABOLIC PANEL
ANION GAP: 9 (ref 5–15)
BUN: 8 mg/dL (ref 6–20)
CHLORIDE: 102 mmol/L (ref 101–111)
CO2: 22 mmol/L (ref 22–32)
Calcium: 8.6 mg/dL — ABNORMAL LOW (ref 8.9–10.3)
Creatinine, Ser: 0.77 mg/dL (ref 0.44–1.00)
Glucose, Bld: 96 mg/dL (ref 65–99)
POTASSIUM: 4 mmol/L (ref 3.5–5.1)
SODIUM: 133 mmol/L — AB (ref 135–145)

## 2017-02-09 LAB — CBC
HEMATOCRIT: 34.2 % — AB (ref 35.0–47.0)
Hemoglobin: 11.6 g/dL — ABNORMAL LOW (ref 12.0–16.0)
MCH: 31 pg (ref 26.0–34.0)
MCHC: 34 g/dL (ref 32.0–36.0)
MCV: 91.3 fL (ref 80.0–100.0)
Platelets: 342 10*3/uL (ref 150–440)
RBC: 3.75 MIL/uL — AB (ref 3.80–5.20)
RDW: 14.4 % (ref 11.5–14.5)
WBC: 13.8 10*3/uL — AB (ref 3.6–11.0)

## 2017-02-09 SURGERY — IRRIGATION AND DEBRIDEMENT EXTREMITY
Anesthesia: General | Laterality: Right

## 2017-02-09 MED ORDER — LIDOCAINE HCL (CARDIAC) 20 MG/ML IV SOLN
INTRAVENOUS | Status: DC | PRN
  Administered 2017-02-09: 100 mg via INTRAVENOUS

## 2017-02-09 MED ORDER — METOCLOPRAMIDE HCL 10 MG PO TABS: 5.0000 mg | ORAL_TABLET | Freq: Three times a day (TID) | ORAL | Status: DC | PRN

## 2017-02-09 MED ORDER — ALPRAZOLAM 1 MG PO TABS
2.0000 mg | ORAL_TABLET | Freq: Three times a day (TID) | ORAL | Status: DC | PRN
  Administered 2017-02-09 – 2017-02-10 (×2): 2 mg via ORAL
  Filled 2017-02-09 (×2): qty 2

## 2017-02-09 MED ORDER — ACETAMINOPHEN 500 MG PO TABS
1000.0000 mg | ORAL_TABLET | Freq: Four times a day (QID) | ORAL | Status: AC
  Administered 2017-02-09 – 2017-02-10 (×2): 1000 mg via ORAL
  Filled 2017-02-09 (×3): qty 2

## 2017-02-09 MED ORDER — MORPHINE SULFATE (PF) 2 MG/ML IV SOLN: 2.0000 mg | INTRAVENOUS | Status: DC | PRN

## 2017-02-09 MED ORDER — SENNA 8.6 MG PO TABS
1.0000 | ORAL_TABLET | Freq: Two times a day (BID) | ORAL | Status: DC
  Administered 2017-02-09 – 2017-02-11 (×4): 8.6 mg via ORAL
  Filled 2017-02-09 (×4): qty 1

## 2017-02-09 MED ORDER — FENTANYL CITRATE (PF) 100 MCG/2ML IJ SOLN
INTRAMUSCULAR | Status: AC
  Filled 2017-02-09: qty 2

## 2017-02-09 MED ORDER — ONDANSETRON HCL 4 MG/2ML IJ SOLN: 4.0000 mg | Freq: Four times a day (QID) | INTRAMUSCULAR | Status: DC | PRN

## 2017-02-09 MED ORDER — PROPOFOL 10 MG/ML IV BOLUS
INTRAVENOUS | Status: DC | PRN
  Administered 2017-02-09: 100 mg via INTRAVENOUS

## 2017-02-09 MED ORDER — LACTATED RINGERS IV SOLN
INTRAVENOUS | Status: DC
  Administered 2017-02-09: 12:00:00 via INTRAVENOUS

## 2017-02-09 MED ORDER — POTASSIUM CHLORIDE 20 MEQ PO PACK
10.0000 meq | PACK | Freq: Every day | ORAL | Status: DC
  Administered 2017-02-09 – 2017-02-11 (×3): 10 meq via ORAL
  Filled 2017-02-09 (×3): qty 1

## 2017-02-09 MED ORDER — ONDANSETRON HCL 4 MG/2ML IJ SOLN: 4.0000 mg | Freq: Once | INTRAMUSCULAR | Status: DC | PRN

## 2017-02-09 MED ORDER — METOCLOPRAMIDE HCL 5 MG/ML IJ SOLN: 5.0000 mg | Freq: Three times a day (TID) | INTRAMUSCULAR | Status: DC | PRN

## 2017-02-09 MED ORDER — OXYCODONE HCL 5 MG PO TABS
5.0000 mg | ORAL_TABLET | ORAL | Status: DC | PRN
  Administered 2017-02-09 – 2017-02-11 (×2): 5 mg via ORAL
  Filled 2017-02-09 (×2): qty 1

## 2017-02-09 MED ORDER — FENTANYL CITRATE (PF) 100 MCG/2ML IJ SOLN
INTRAMUSCULAR | Status: AC
  Administered 2017-02-09: 25 ug via INTRAVENOUS
  Filled 2017-02-09: qty 2

## 2017-02-09 MED ORDER — VANCOMYCIN HCL IN DEXTROSE 750-5 MG/150ML-% IV SOLN
750.0000 mg | Freq: Two times a day (BID) | INTRAVENOUS | Status: DC
  Administered 2017-02-09 – 2017-02-10 (×2): 750 mg via INTRAVENOUS
  Filled 2017-02-09 (×3): qty 150

## 2017-02-09 MED ORDER — KETOROLAC TROMETHAMINE 15 MG/ML IJ SOLN
15.0000 mg | Freq: Four times a day (QID) | INTRAMUSCULAR | Status: AC
  Administered 2017-02-09 – 2017-02-10 (×3): 15 mg via INTRAVENOUS
  Filled 2017-02-09 (×4): qty 1

## 2017-02-09 MED ORDER — ONDANSETRON HCL 4 MG/2ML IJ SOLN
INTRAMUSCULAR | Status: DC | PRN
  Administered 2017-02-09: 4 mg via INTRAVENOUS

## 2017-02-09 MED ORDER — ONDANSETRON HCL 4 MG PO TABS: 4.0000 mg | ORAL_TABLET | Freq: Four times a day (QID) | ORAL | Status: DC | PRN

## 2017-02-09 MED ORDER — HYDROMORPHONE HCL 1 MG/ML IJ SOLN
2.0000 mg | INTRAMUSCULAR | Status: DC | PRN
  Administered 2017-02-09 (×2): 2 mg via INTRAVENOUS
  Filled 2017-02-09 (×2): qty 2

## 2017-02-09 MED ORDER — MIDAZOLAM HCL 2 MG/2ML IJ SOLN
INTRAMUSCULAR | Status: DC | PRN
  Administered 2017-02-09: 2 mg via INTRAVENOUS

## 2017-02-09 MED ORDER — ACETAMINOPHEN 650 MG RE SUPP: 650.0000 mg | RECTAL | Status: DC | PRN

## 2017-02-09 MED ORDER — LEVOFLOXACIN IN D5W 750 MG/150ML IV SOLN
750.0000 mg | Freq: Every day | INTRAVENOUS | Status: DC
  Administered 2017-02-09: 750 mg via INTRAVENOUS
  Filled 2017-02-09 (×2): qty 150

## 2017-02-09 MED ORDER — ACETAMINOPHEN 325 MG PO TABS
650.0000 mg | ORAL_TABLET | ORAL | Status: DC | PRN
  Administered 2017-02-11: 650 mg via ORAL
  Filled 2017-02-09: qty 2

## 2017-02-09 MED ORDER — QUETIAPINE FUMARATE 25 MG PO TABS
100.0000 mg | ORAL_TABLET | Freq: Every day | ORAL | Status: DC
Start: 2017-02-09 — End: 2017-02-11
  Administered 2017-02-09 – 2017-02-10 (×2): 100 mg via ORAL
  Filled 2017-02-09 (×2): qty 4

## 2017-02-09 MED ORDER — DOCUSATE SODIUM 100 MG PO CAPS
100.0000 mg | ORAL_CAPSULE | Freq: Two times a day (BID) | ORAL | Status: DC
  Administered 2017-02-09 – 2017-02-11 (×4): 100 mg via ORAL
  Filled 2017-02-09 (×4): qty 1

## 2017-02-09 MED ORDER — LACTATED RINGERS IV SOLN
INTRAVENOUS | Status: DC | PRN
  Administered 2017-02-09: 10:00:00 via INTRAVENOUS

## 2017-02-09 MED ORDER — PHENYLEPHRINE HCL 10 MG/ML IJ SOLN
INTRAMUSCULAR | Status: DC | PRN
  Administered 2017-02-09 (×3): 100 ug via INTRAVENOUS
  Administered 2017-02-09: 200 ug via INTRAVENOUS
  Administered 2017-02-09 (×2): 100 ug via INTRAVENOUS
  Administered 2017-02-09 (×3): 200 ug via INTRAVENOUS
  Administered 2017-02-09: 100 ug via INTRAVENOUS
  Administered 2017-02-09: 200 ug via INTRAVENOUS

## 2017-02-09 MED ORDER — SODIUM CHLORIDE 0.9 % IR SOLN
Status: DC | PRN
  Administered 2017-02-09: 1000 mL

## 2017-02-09 MED ORDER — DEXAMETHASONE SODIUM PHOSPHATE 10 MG/ML IJ SOLN
INTRAMUSCULAR | Status: DC | PRN
  Administered 2017-02-09: 10 mg via INTRAVENOUS

## 2017-02-09 MED ORDER — ALBUTEROL SULFATE (2.5 MG/3ML) 0.083% IN NEBU
2.5000 mg | INHALATION_SOLUTION | RESPIRATORY_TRACT | Status: DC | PRN
  Administered 2017-02-09 – 2017-02-10 (×2): 2.5 mg via RESPIRATORY_TRACT
  Filled 2017-02-09 (×2): qty 3

## 2017-02-09 MED ORDER — CARISOPRODOL 350 MG PO TABS
350.0000 mg | ORAL_TABLET | Freq: Three times a day (TID) | ORAL | Status: DC
  Administered 2017-02-09 – 2017-02-11 (×5): 350 mg via ORAL
  Filled 2017-02-09 (×6): qty 1

## 2017-02-09 MED ORDER — FENTANYL CITRATE (PF) 100 MCG/2ML IJ SOLN
25.0000 ug | INTRAMUSCULAR | Status: AC | PRN
  Administered 2017-02-09 (×6): 25 ug via INTRAVENOUS

## 2017-02-09 MED ORDER — FENTANYL CITRATE (PF) 100 MCG/2ML IJ SOLN
INTRAMUSCULAR | Status: DC | PRN
  Administered 2017-02-09: 50 ug via INTRAVENOUS

## 2017-02-09 MED ORDER — OXYCODONE HCL 5 MG PO TABS
10.0000 mg | ORAL_TABLET | ORAL | Status: DC | PRN
  Administered 2017-02-10 – 2017-02-11 (×2): 10 mg via ORAL
  Filled 2017-02-09 (×2): qty 2

## 2017-02-09 SURGICAL SUPPLY — 43 items
BANDAGE ACE 3X5.8 VEL STRL LF (GAUZE/BANDAGES/DRESSINGS) ×3 IMPLANT
BNDG COHESIVE 4X5 TAN STRL (GAUZE/BANDAGES/DRESSINGS) ×3 IMPLANT
BNDG GAUZE 4.5X4.1 6PLY STRL (MISCELLANEOUS) ×3 IMPLANT
BRUSH SCRUB EZ  4% CHG (MISCELLANEOUS) ×4
BRUSH SCRUB EZ 4% CHG (MISCELLANEOUS) ×2 IMPLANT
CANISTER SUCT 1200ML W/VALVE (MISCELLANEOUS) ×3 IMPLANT
CANISTER SUCT 3000ML PPV (MISCELLANEOUS) ×3 IMPLANT
CHLORAPREP W/TINT 26ML (MISCELLANEOUS) ×6 IMPLANT
DRAPE INCISE IOBAN 66X60 STRL (DRAPES) ×3 IMPLANT
DRAPE SHEET LG 3/4 BI-LAMINATE (DRAPES) IMPLANT
DRAPE SURG 17X11 SM STRL (DRAPES) IMPLANT
DRAPE U-SHAPE 47X51 STRL (DRAPES) IMPLANT
ELECT REM PT RETURN 9FT ADLT (ELECTROSURGICAL) ×3
ELECTRODE REM PT RTRN 9FT ADLT (ELECTROSURGICAL) ×1 IMPLANT
GLOVE INDICATOR 8.0 STRL GRN (GLOVE) ×3 IMPLANT
GLOVE SURG ORTHO 8.0 STRL STRW (GLOVE) ×3 IMPLANT
GOWN STRL REUS W/ TWL LRG LVL3 (GOWN DISPOSABLE) ×1 IMPLANT
GOWN STRL REUS W/ TWL XL LVL3 (GOWN DISPOSABLE) ×1 IMPLANT
GOWN STRL REUS W/TWL LRG LVL3 (GOWN DISPOSABLE) ×2
GOWN STRL REUS W/TWL XL LVL3 (GOWN DISPOSABLE) ×2
IV CATH ANGIO 14GX1.88 NO SAFE (IV SOLUTION) ×3 IMPLANT
KIT RM TURNOVER STRD PROC AR (KITS) ×3 IMPLANT
LOOP RED MAXI  1X406MM (MISCELLANEOUS) ×2
LOOP VESSEL MAXI 1X406 RED (MISCELLANEOUS) ×1 IMPLANT
NS IRRIG 1000ML POUR BTL (IV SOLUTION) ×3 IMPLANT
PACK EXTREMITY ARMC (MISCELLANEOUS) ×3 IMPLANT
PADDING CAST 3IN STRL (MISCELLANEOUS) ×6
PADDING CAST BLEND 3X4 STRL (MISCELLANEOUS) ×3 IMPLANT
SPLINT CAST 1 STEP 3X12 (MISCELLANEOUS) ×3 IMPLANT
STAPLER SKIN PROX 35W (STAPLE) ×3 IMPLANT
STOCKINETTE IMPERVIOUS 9X36 MD (GAUZE/BANDAGES/DRESSINGS) ×3 IMPLANT
STOCKINETTE STRL 4IN 9604848 (GAUZE/BANDAGES/DRESSINGS) ×3 IMPLANT
SUT ETHILON 2 0 FSLX (SUTURE) IMPLANT
SUT ETHILON 4-0 (SUTURE) ×4
SUT ETHILON 4-0 FS2 18XMFL BLK (SUTURE) ×2
SUT ETHILON NAB PS2 4-0 18IN (SUTURE) IMPLANT
SUT PDS AB 2-0 CT1 27 (SUTURE) IMPLANT
SUT VIC AB 1 CT1 36 (SUTURE) IMPLANT
SUT VIC AB 2-0 CT1 27 (SUTURE)
SUT VIC AB 2-0 CT1 TAPERPNT 27 (SUTURE) IMPLANT
SUT VICRYL AB 3-0 FS1 BRD 27IN (SUTURE) IMPLANT
SUTURE ETHLN 4-0 FS2 18XMF BLK (SUTURE) ×2 IMPLANT
TOWEL OR 17X26 4PK STRL BLUE (TOWEL DISPOSABLE) ×3 IMPLANT

## 2017-02-09 NOTE — Progress Notes (Signed)
[name] [name] NAME: Tamara Mcclure    MR#:  [id number]  DATE OF BIRTH:  [birth year]  SUBJECTIVE:  CHIEF COMPLAINT:  Pt had rt hand  I n D today, pain manageable  REVIEW OF SYSTEMS:  CONSTITUTIONAL: No fever, fatigue or weakness.  EYES: No blurred or double vision.  EARS, NOSE, AND THROAT: No tinnitus or ear pain.  RESPIRATORY: No cough, shortness of breath, wheezing or hemoptysis.  CARDIOVASCULAR: No chest pain, orthopnea, edema.  GASTROINTESTINAL: No nausea, vomiting, diarrhea or abdominal pain.  GENITOURINARY: No dysuria, hematuria.  ENDOCRINE: No polyuria, nocturia,  HEMATOLOGY: No anemia, easy bruising or bleeding SKIN: No rash or lesion. MUSCULOSKELETAL: rt hand pain     NEUROLOGIC: No tingling, numbness, weakness.  PSYCHIATRY: No anxiety or depression.   DRUG ALLERGIES:   Allergies  Allergen Reactions  . Penicillins Rash    Has patient had a PCN reaction causing immediate rash, facial/tongue/throat swelling, SOB or lightheadedness with hypotension: Yes Has patient had a PCN reaction causing severe rash involving mucus membranes or skin necrosis: No Has patient had a PCN reaction that required hospitalization: No Has patient had a PCN reaction occurring within the last 10 years: No If all of the above answers are "NO", then may proceed with Cephalosporin use.    VITALS:  Blood pressure 113/60, pulse (!) 116, temperature 99.3 F (37.4 C), temperature source Oral, resp. rate 16, height 5\' 5"  (1.651 m), weight 63.7 kg (140 lb 8 oz), SpO2 94 %.  PHYSICAL EXAMINATION:  GENERAL:  62 y.o.-year-old patient lying in the bed with no acute distress.  EYES: Pupils equal, round, reactive to light and accommodation. No scleral icterus. Extraocular muscles intact.  HEENT: Head atraumatic, normocephalic. Oropharynx and nasopharynx clear.  NECK:  Supple, no jugular venous distention. No thyroid enlargement, no tenderness.  LUNGS:  Normal breath sounds bilaterally, no wheezing, rales,rhonchi or crepitation. No use of accessory muscles of respiration.  CARDIOVASCULAR: S1, S2 normal. No murmurs, rubs, or gallops.  ABDOMEN: Soft, nontender, nondistended. Bowel sounds present. No organomegaly or mass.  EXTREMITIES: Rt hand s/p incision and drainage No pedal edema, cyanosis, or clubbing.  NEUROLOGIC: Cranial nerves II through XII are intact. Muscle strength 5/5 in all extremities. Sensation intact. Gait not checked.  PSYCHIATRIC: The patient is alert and oriented x 3.  SKIN: No obvious rash, lesion, or ulcer.    LABORATORY PANEL:   CBC Recent Labs  Lab 02/09/17 0355  WBC 13.8*  HGB 11.6*  HCT 34.2*  PLT 342   ------------------------------------------------------------------------------------------------------------------  Chemistries  Recent Labs  Lab 02/08/17 1554 02/09/17 0355  NA 133* 133*  K 4.2 4.0  CL 96* 102  CO2 27 22  GLUCOSE 100* 96  BUN 7 8  CREATININE 0.53 0.77  CALCIUM 9.5 8.6*  AST 22  --   ALT 16  --   ALKPHOS 107  --   BILITOT 0.4  --    ------------------------------------------------------------------------------------------------------------------  Cardiac Enzymes No results for input(s): TROPONINI in the last 168 hours. ------------------------------------------------------------------------------------------------------------------  RADIOLOGY:  Ct Hand Right W Contrast  Result Date: 02/08/2017 CLINICAL DATA:  Pt to ED reporting she woke up this morning and noted that her right thumb was stiff. Later she noted a bruised area and redness and swelling in right hand. pT now has a red streak running up right arm that reaches the upper right arm. EXAM: CT OF THE UPPER RIGHT EXTREMITY WITH CONTRAST TECHNIQUE: Multidetector CT imaging of  the upper right extremity was performed according to the standard protocol following intravenous contrast administration. COMPARISON:  None. CONTRAST:   16mL ISOVUE-370 IOPAMIDOL (ISOVUE-370) INJECTION 76% FINDINGS: Bones/Joint/Cartilage No osseous fracture or dislocation seen. No destructive change or focal demineralization to suggest osteomyelitis. Ligaments Suboptimally assessed by CT. Muscles and Tendons No intramuscular fluid collection or abscess collection seen. No obvious tendon disruption. Soft tissues Edema within the subcutaneous soft tissues overlying the distal phalanx of the right thumb. Tiny hypodense focus that appears to be contiguous with the skin surface overlying the tuft of the distal phalanx may represent a small abscess, measuring approximately 4 mm greatest dimension (series 7, image 70; series 8, image 65). No soft tissue gas identified. IMPRESSION: 1. Ill-defined edema within the subcutaneous soft tissues overlying the distal phalanx of the right thumb. Tiny hypodense focus within the superficial soft tissues overlying the tuft of the distal phalanx is suspicious for small abscess, likely contiguous with the skin surface, measuring approximately 4 mm greatest dimension. 2. No soft tissue gas identified. 3. No evidence of osteomyelitis. 4. Consider MRI for more definitive characterization of the soft tissues of the right hand and arm. These results will be called to the ordering clinician or representative by the Radiologist Assistant, and communication documented in the PACS or zVision Dashboard. Electronically Signed   By: Franki Cabot M.D.   On: 02/08/2017 20:44   Dg Hand Complete Right  Result Date: 02/08/2017 CLINICAL DATA:  Cellulitis, swelling EXAM: RIGHT HAND - COMPLETE 3+ VIEW COMPARISON:  None. FINDINGS: Mild joint space narrowing in the IP joints. Early spurring. No acute bony abnormality. Specifically, no fracture, subluxation, or dislocation. Soft tissues unremarkable. IMPRESSION: No acute bony abnormality. Electronically Signed   By: Rolm Baptise M.D.   On: 02/08/2017 18:33    EKG:   Orders placed or performed during  the hospital encounter of 09/29/14  . EKG 12-Lead  . EKG 12-Lead  . ED EKG  . ED EKG  . EKG    ASSESSMENT AND PLAN:   # Sepsis due to right hand and arm cellulitis. Sepsis protocol follow-up CBC and cultures. S/p incision and drainage of rt hand - by dr.Bowers Pain management prn ID is recommending levoflox and vanc .  Hyponatremia.   NA is better with normal saline IV  Na - 133   COPD.  Stable, nebulizer as needed.  Hypertension.  Continue Norvasc.  Anxiety.  Continue home medication . Tobacco abuse.  Smoking cessation .  Nicotine patch   OT ewcommending HH OT     All the records are reviewed and case discussed with Care Management/Social Workerr. Management plans discussed with the patient, family and they are in agreement.  CODE STATUS: fc  TOTAL TIME TAKING CARE OF THIS PATIENT: 36 minutes.   POSSIBLE D/C IN 1-2 DAYS, DEPENDING ON CLINICAL CONDITION.  Note: This dictation was prepared with Dragon dictation along with smaller phrase technology. Any transcriptional errors that result from this process are unintentional.   Nicholes Mango M.D on 02/09/2017 at 9:13 PM  Between 7am to 6pm - Pager - [phone] After 6pm go to [email or web address] - password [email or web address] [facility]  [facility]  Office  [phone]  CC: Primary care physician; Roselee Nova, MD

## 2017-02-09 NOTE — Progress Notes (Signed)
MD saw patient and is taking patient to surgery. NPO since midnight. No CHG or MRSA completed. NSL in place. No orders for consent. OR here to take patient. Blank consent placed in chart. Family at bedside.

## 2017-02-09 NOTE — Anesthesia Preprocedure Evaluation (Addendum)
Anesthesia Evaluation  Patient identified by MRN, date of birth, ID band Patient awake    Reviewed: Allergy & Precautions, NPO status , Patient's Chart, lab work & pertinent test results, reviewed documented beta blocker date and time   Airway Mallampati: II  TM Distance: >3 FB     Dental  (+) Partial Upper, Missing, Poor Dentition   Pulmonary Current Smoker,           Cardiovascular hypertension,      Neuro/Psych    GI/Hepatic   Endo/Other    Renal/GU      Musculoskeletal   Abdominal   Peds  Hematology   Anesthesia Other Findings No reflux. Is hungry this am. LMA is ok.  Reproductive/Obstetrics                            Anesthesia Physical Anesthesia Plan  ASA: II  Anesthesia Plan: General   Post-op Pain Management:    Induction: Intravenous  PONV Risk Score and Plan:   Airway Management Planned: LMA  Additional Equipment:   Intra-op Plan:   Post-operative Plan:   Informed Consent: I have reviewed the patients History and Physical, chart, labs and discussed the procedure including the risks, benefits and alternatives for the proposed anesthesia with the patient or authorized representative who has indicated his/her understanding and acceptance.     Plan Discussed with: CRNA  Anesthesia Plan Comments:         Anesthesia Quick Evaluation

## 2017-02-09 NOTE — Progress Notes (Signed)
Patient returned to floor at 1205. Splint in right arm. CMTS intact. A&O x4. VSS. Bed alarm on for safety. IV fluids started and tele applied.

## 2017-02-09 NOTE — Evaluation (Signed)
Occupational Therapy Evaluation Patient Details Name: Tamara Mcclure MRN: [id number] DOB: 04-26-1955 Today's Date: 02/09/2017    History of Present Illness 62yo female pt s/p I&D of R thumb/hand/forearm on 1/31 secondary to infection (RUE NWBing). PMHx significant for current smoker, HTN, COPD, chronic lower back pain, anxiety, severe panic disorder, and peripheral neuropathy.    Clinical Impression   Pt seen for OT evaluation this date, POD#0 from I&D of RUE. Prior to admission, pt was independent, living by herself in a mobile home with 2 steps to enter and a tub shower. Pt's son lives next door. Pt notes she is able to drive but hasn't in a little while due to having 2 flat tires and unable to afford getting them fixed. Pt is a current smoker, and uses kerosene heaters in the home. Pt currently presents with pain in RUE and numbness in R thumb, chronic pain in bilateral feet due to neuropathy as well as cramping in her feet (pt attributes this to not having had her potassium supplement this am, RN notified), and RUE currently NWBing. Pt able to perform bed mobility with modified independence and requiring close supervision for transfers from EOB to bathroom and ambulation to/from for safety as pt tends to move quickly. Pt initially attempted to perform peri hygiene using RUE (dominant hand) requiring verbal cues and education into risk of infection/further injury. Pt noted some difficulty with performing hygiene using LUE but was able to perform without physical assist. Pt able to perform LB dressing while seated with LUE without difficulty. Pt educated in 1-handed strategies for ADL tasks to maintain RUE precautions and maximize independence. Pt expressed worry over not being able to wash her hair or manage her kerosene heaters without her R arm. Encouraged pt to speak with her son and other family members who can provide minimal assist at home for these types of tasks. Pt verbalized understanding of  the importance of protecting the RUE at this time. Pt educated in home/routines modification, falls prevention, and stress/pain mgt strategies including pursed lip breathing to support health. Pt easily becomes anxious and tearful during session with pt verbalizing worry focusing on current situation and her overall health, stating "Getting old really sucks." Provided emotional support. RN in at end of session, notified of pt's concerns about medications and need for pain medication. Pt will benefit from skilled OT services to address noted impairments and functional deficits in self care tasks. Recommend HHOT services as appropriate for follow up therapy of RUE pending surgeon's plan of care as well as PRN assist from family.     Follow Up Recommendations  Home health OT    Equipment Recommendations  Other (comment)(TBD)    Recommendations for Other Services       Precautions / Restrictions Precautions Precautions: Fall Restrictions Weight Bearing Restrictions: Yes RUE Weight Bearing: Non weight bearing      Mobility Bed Mobility Overal bed mobility: Modified Independent             General bed mobility comments: good effort, modified approach so as to not use RUE  Transfers Overall transfer level: Needs assistance Equipment used: None Transfers: Sit to/from Stand Sit to Stand: Supervision         General transfer comment: supervision for safety as pt mildly impulsive, tendency to want to incorporate RUE into tasks    Balance Overall balance assessment: Needs assistance Sitting-balance support: No upper extremity supported;Feet supported Sitting balance-Leahy Scale: Normal     Standing balance support:  No upper extremity supported Standing balance-Leahy Scale: Good                             ADL either performed or assessed with clinical judgement   ADL Overall ADL's : Needs assistance/impaired Eating/Feeding: Modified independent;Sitting    Grooming: Sitting;Modified independent   Upper Body Bathing: Sitting;Set up;Supervision/ safety   Lower Body Bathing: Sit to/from stand;Supervison/ safety   Upper Body Dressing : Sitting;Minimal assistance   Lower Body Dressing: Sit to/from stand;Supervision/safety   Toilet Transfer: Supervision/safety;Cueing for safety;Ambulation;Regular Toilet   Toileting- Water quality scientist and Hygiene: Sit to/from stand;Cueing for safety;Modified independent Toileting - Clothing Manipulation Details (indicate cue type and reason): initially attempts to use R hand to perform perihygiene, requiring verbal cues to attempt with L hand with pt ultimately able to do. Educated in risk reduction for infection/injury to R hand.     Functional mobility during ADLs: Supervision/safety;Cueing for safety(VC to slow down, as tends to move slightly quickly) General ADL Comments: Pt educated in strategies to support independence and safety with ADL tasks while maintaining precautions.     Vision Baseline Vision/History: Wears glasses Wears Glasses: Reading only Patient Visual Report: No change from baseline       Perception     Praxis      Pertinent Vitals/Pain Pain Assessment: 0-10 Pain Score: 6  Pain Location: RUE, 8/10 B feet (neuropathy, cramping due to not having potassium earlier this date) Pain Descriptors / Indicators: Aching;Cramping Pain Intervention(s): Limited activity within patient's tolerance;Monitored during session;Patient requesting pain meds-RN notified;Utilized relaxation techniques;Repositioned     Hand Dominance Right   Extremity/Trunk Assessment Upper Extremity Assessment Upper Extremity Assessment: RUE deficits/detail(LUE WFL) RUE Deficits / Details: RUE NWBing, in splint; requires occasional verbal cues to use LUE for tasks as she has the tendency to attempt to use R dominant hand initially for tasks. Thumb is numb, intact sensation in all other fingers. Pt states that the  redness has decreased in RUE and no longer itchy RUE: Unable to fully assess due to pain;Unable to fully assess due to immobilization RUE Coordination: decreased fine motor   Lower Extremity Assessment Lower Extremity Assessment: Overall WFL for tasks assessed(pain/cramping in both feet, chronic, RN aware)   Cervical / Trunk Assessment Cervical / Trunk Assessment: Normal   Communication Communication Communication: No difficulties   Cognition Arousal/Alertness: Awake/alert Behavior During Therapy: Anxious Overall Cognitive Status: Within Functional Limits for tasks assessed                                 General Comments: Pt anxious, becomes tearful talking about what she will do when she goes home.   General Comments  RN assessed splint at end of session to reassure pt there was no new bleeding    Exercises Other Exercises Other Exercises: Pt educated in home/routines modifications to support adherence to RUE NWBing precautions.  Other Exercises: Pt educated in pursed lip breathing to support stress and pain mgt   Shoulder Instructions      Home Living Family/patient expects to be discharged to:: Private residence Living Arrangements: Alone Available Help at Discharge: Family;Available PRN/intermittently(son lives next door) Type of Home: Mobile home Home Access: Stairs to enter [employer] of Steps: 2 Entrance Stairs-Rails: None Home Layout: One level     Bathroom Shower/Tub: Teacher, early years/pre: Standard     Home  Equipment: None          Prior Functioning/Environment Level of Independence: Independent        Comments: Pt independent at baseline with mobility and ADL/IADL, can drive but has 2 flat tires and is unable to afford new tires. Uses kerosene heaters to heat the home. Pt questions whether son/family will be able to help much.        OT Problem List: Decreased knowledge of use of DME or AE;Decreased range of  motion;Decreased strength;Decreased knowledge of precautions;Impaired UE functional use;Pain;Impaired sensation;Decreased safety awareness      OT Treatment/Interventions: Self-care/ADL training;Therapeutic exercise;Therapeutic activities;DME and/or AE instruction;Patient/family education    OT Goals(Current goals can be found in the care plan section) Acute Rehab OT Goals Patient Stated Goal: go home and get better OT Goal Formulation: With patient Time For Goal Achievement: 02/23/17 Potential to Achieve Goals: Good ADL Goals Pt Will Perform Upper Body Dressing: with supervision;with set-up;sitting(using L hand and maintaining RUE NWBing status) Pt Will Perform Toileting - Clothing Manipulation and hygiene: with modified independence;sit to/from stand(using L hand and maintaining RUE NWBing status) Additional ADL Goal #1: Pt will perform fine motor tasks with modified independence using L hand while maintaining RUE NWBing precautions to maximize independence and safety.  OT Frequency: Min 1X/week   Barriers to D/C:            Co-evaluation              AM-PAC PT "6 Clicks" Daily Activity     Outcome Measure Help from another person eating meals?: A Little Help from another person taking care of personal grooming?: A Little Help from another person toileting, which includes using toliet, bedpan, or urinal?: A Little Help from another person bathing (including washing, rinsing, drying)?: A Little Help from another person to put on and taking off regular upper body clothing?: A Little Help from another person to put on and taking off regular lower body clothing?: A Little 6 Click Score: 18   End of Session Nurse Communication: Patient requests pain meds  Activity Tolerance: Patient tolerated treatment well Patient left: in bed;with call bell/phone within reach;with bed alarm set  OT Visit Diagnosis: Pain;Other abnormalities of gait and mobility (R26.89) Pain - Right/Left:  Right Pain - part of body: Arm                Time: 1441-1529 OT Time Calculation (min): 48 min Charges:  OT General Charges $OT Visit: 1 Visit OT Evaluation $OT Eval Moderate Complexity: 1 Mod OT Treatments $Self Care/Home Management : 23-37 mins  Jeni Salles, MPH, MS, OTR/L ascom [phone] 02/09/17, 4:19 PM

## 2017-02-09 NOTE — Progress Notes (Signed)
Pt; c/o pain 10 put of 10. Dr.Maier notified and ordered ditaudid.

## 2017-02-09 NOTE — Transfer of Care (Signed)
Immediate Anesthesia Transfer of Care Note  Patient: Tamara Mcclure  Procedure(s) Performed: IRRIGATION AND DEBRIDEMENT EXTREMITY (Right )  Patient Location: PACU  Anesthesia Type:General  Level of Consciousness: drowsy and patient cooperative  Airway & Oxygen Therapy: Patient Spontanous Breathing and Patient connected to face mask oxygen  Post-op Assessment: Report given to RN and Post -op Vital signs reviewed and stable  Post vital signs: Reviewed and stable  Last Vitals:  Vitals:   02/09/17 0552 02/09/17 0830  BP: 133/61 118/69  Pulse: (!) 105 (!) 109  Resp:    Temp: (!) 38.4 C 37.4 C  SpO2: 92% 94%    Last Pain:  Vitals:   02/09/17 0830  TempSrc: Oral  PainSc: 8          Complications: No apparent anesthesia complications

## 2017-02-09 NOTE — Progress Notes (Signed)
Pharmacy Antibiotic Note  Tamara Mcclure is a 62 y.o. female admitted on 02/08/2017 with cellulitis.  Pharmacy has been consulted for vancomycin dosing. Abx changed to vancomycin and Levaquin 1/31 per ID.  Plan: Vancomycin 750mg  IV every 12 hours.  Goal trough 10-15 mcg/mL. Vancomycin trough 2/1@1030    Levaquin 750 mg iv q 24 hours.   Height: 5\' 5"  (165.1 cm) Weight: 140 lb 8 oz (63.7 kg) IBW/kg (Calculated) : 57  Temp (24hrs), Avg:99.7 F (37.6 C), Min:98.7 F (37.1 C), Max:101.1 F (38.4 C)  Recent Labs  Lab 02/08/17 1554 02/09/17 0355  WBC 13.9* 13.8*  CREATININE 0.53 0.77  LATICACIDVEN 0.7  --     Estimated Creatinine Clearance: 67.3 mL/min (by C-G formula based on SCr of 0.77 mg/dL).    Allergies  Allergen Reactions  . Penicillins Rash    Has patient had a PCN reaction causing immediate rash, facial/tongue/throat swelling, SOB or lightheadedness with hypotension: Yes Has patient had a PCN reaction causing severe rash involving mucus membranes or skin necrosis: No Has patient had a PCN reaction that required hospitalization: No Has patient had a PCN reaction occurring within the last 10 years: No If all of the above answers are "NO", then may proceed with Cephalosporin use.    Antimicrobials this admission: Anti-infectives (From admission, onward)   Start     Dose/Rate Route Frequency Ordered Stop   02/09/17 1700  levofloxacin (LEVAQUIN) IVPB 750 mg     750 mg 100 mL/hr over 90 Minutes Intravenous Daily-1800 02/09/17 1602     02/09/17 1106  50,000 units bacitracin in 0.9% normal saline 250 mL irrigation  Status:  Discontinued       As needed 02/09/17 1107 02/09/17 1107   02/08/17 2300  vancomycin (VANCOCIN) IVPB 750 mg/150 ml premix  Status:  Discontinued     750 mg 150 mL/hr over 60 Minutes Intravenous Every 12 hours 02/08/17 1816 02/09/17 1214   02/08/17 2200  meropenem (MERREM) 1 g in sodium chloride 0.9 % 100 mL IVPB  Status:  Discontinued     1 g 200 mL/hr  over 30 Minutes Intravenous Every 8 hours 02/08/17 1844 02/09/17 1214   02/08/17 1730  aztreonam (AZACTAM) 2 g in dextrose 5 % 50 mL IVPB     2 g 100 mL/hr over 30 Minutes Intravenous  Once 02/08/17 1715 02/08/17 1934   02/08/17 1730  metroNIDAZOLE (FLAGYL) IVPB 500 mg  Status:  Discontinued     500 mg 100 mL/hr over 60 Minutes Intravenous  Once 02/08/17 1715 02/09/17 0922   02/08/17 1730  vancomycin (VANCOCIN) IVPB 1000 mg/200 mL premix  Status:  Discontinued     1,000 mg 200 mL/hr over 60 Minutes Intravenous  Once 02/08/17 1715 02/09/17 0175      Microbiology results: Recent Results (from the past 240 hour(s))  Blood culture (routine x 2)     Status: None (Preliminary result)   Collection Time: 02/08/17  6:09 PM  Result Value Ref Range Status   Specimen Description BLOOD LEFT WRIST  Final   Special Requests   Final    BOTTLES DRAWN AEROBIC AND ANAEROBIC Blood Culture results may not be optimal due to an excessive volume of blood received in culture bottles   Culture   Final    NO GROWTH < 24 HOURS Performed at [facility], [address] [place], [place] [address]    Report Status PENDING  Incomplete  Blood culture (routine x 2)  Status: None (Preliminary result)   Collection Time: 02/08/17  6:09 PM  Result Value Ref Range Status   Specimen Description BLOOD LFOA  Final   Special Requests   Final    BOTTLES DRAWN AEROBIC AND ANAEROBIC Blood Culture results may not be optimal due to an excessive volume of blood received in culture bottles   Culture   Final    NO GROWTH < 24 HOURS Performed at [facility], [address], [place], [place] [address]    Report Status PENDING  Incomplete  Aerobic/Anaerobic Culture (surgical/deep wound)     Status: None (Preliminary result)   Collection Time: 02/09/17 10:12 AM  Result Value Ref Range Status   Specimen Description   Final    THUMB RIGHT Performed at [facility], [place] [address] [place],  [place] [address]    Special Requests   Final    NONE Performed at [facility], [place], [place] [address]    Gram Stain   Final    RARE WBC PRESENT, PREDOMINANTLY PMN MODERATE GRAM POSITIVE COCCI Performed at [facility], [place] [address], [place], [place] [address]    Culture PENDING  Incomplete   Report Status PENDING  Incomplete    Thank you for allowing pharmacy to be a part of this patient's care.  Ulice Dash D 02/09/2017 4:03 PM

## 2017-02-09 NOTE — Consult Note (Signed)
ORTHOPAEDIC CONSULTATION  REQUESTING PHYSICIAN: Nicholes Mango, MD  Chief Complaint: right hand pain  HPI: Tamara Mcclure is a 62 y.o. female who complains of severe right hand pain. It began yesterday with a little redness on the middle aspect of the thumb, but has progressively worsened. She denies and trauma other than a small scrape. She has had no prior surgeries and has no history of immunocompromise. She has had a low grade fever of 101.1 degrees Fahrenheit. No other complaints. She is a smoker.  Past Medical History:  Diagnosis Date  . Anxiety   . Chronic lower back pain   . COPD (chronic obstructive pulmonary disease) ([place])   . Depression   . Hypertension   . Panic disorder with agoraphobia and severe panic attacks   . Peripheral sensory neuropathy    Past Surgical History:  Procedure Laterality Date  . none     Social History   Socioeconomic History  . Marital status: Single    Spouse name: None  . Number of children: None  . Years of education: None  . Highest education level: None  Social Needs  . Financial resource strain: None  . Food insecurity - worry: None  . Food insecurity - inability: None  . Transportation needs - medical: None  . Transportation needs - non-medical: None  Occupational History  . None  Tobacco Use  . Smoking status: Current Every Day Smoker    Packs/day: 1.00    Years: 30.00    Pack years: 30.00    Types: Cigarettes  . Smokeless tobacco: Never Used  Substance and Sexual Activity  . Alcohol use: No  . Drug use: No  . Sexual activity: Not Currently  Other Topics Concern  . None  Social History Narrative  . None   Family History  Problem Relation Age of Onset  . Cancer Mother        Lung  . Cancer Father        pancreatic  . Breast cancer Neg Hx   . Ovarian cancer Neg Hx   . Colon cancer Neg Hx   . Diabetes Neg Hx   . Heart disease Neg Hx    Allergies  Allergen Reactions  . Penicillins Rash    Has patient had a  PCN reaction causing immediate rash, facial/tongue/throat swelling, SOB or lightheadedness with hypotension: Yes Has patient had a PCN reaction causing severe rash involving mucus membranes or skin necrosis: No Has patient had a PCN reaction that required hospitalization: No Has patient had a PCN reaction occurring within the last 10 years: No If all of the above answers are "NO", then may proceed with Cephalosporin use.   Prior to Admission medications   Medication Sig Start Date End Date Taking? Authorizing Provider  alprazolam Duanne Moron) 2 MG tablet Take 1 tablet (2 mg total) by mouth 3 (three) times daily as needed for anxiety. 01/12/17  Yes Rochel Brome A, MD  amLODipine (NORVASC) 10 MG tablet Take 1 tablet (10 mg total) by mouth daily. 09/22/16  Yes Rochel Brome A, MD  Azelastine HCl 0.15 % SOLN Place 1 spray into the nose 2 (two) times daily. 10/31/16 02/08/17 Yes Roselee Nova, MD  carisoprodol (SOMA) 350 MG tablet Take 1 tablet (350 mg total) by mouth 3 (three) times daily. 01/12/17  Yes Keith Rake Asad A, MD  COMBIVENT RESPIMAT 20-100 MCG/ACT AERS respimat INHALE 1 PUFF INTO THE LUNGS EVERY 6 HOURS AS NEEDED FOR WHEEZING OR  SHORTNESS OF BREATH 10/16/15  Yes Keith Rake Asad A, MD  fluticasone furoate-vilanterol (BREO ELLIPTA) 100-25 MCG/INH AEPB Inhale 1 puff into the lungs daily. 11/26/15  Yes Roselee Nova, MD  HYDROcodone-acetaminophen (NORCO) 10-325 MG tablet Take 1 tablet by mouth every 6 (six) hours as needed. 01/12/17  Yes Roselee Nova, MD  potassium chloride (K-DUR) 10 MEQ tablet TAKE 1 TABLET BY MOUTH ONCE A DAY 07/16/15  Yes Roselee Nova, MD  pregabalin (LYRICA) 75 MG capsule Take 1 capsule (75 mg total) by mouth 3 (three) times daily. 01/12/17  Yes Roselee Nova, MD  QUEtiapine (SEROQUEL) 100 MG tablet Take 1 tablet (100 mg total) by mouth at bedtime. 12/13/16  Yes Roselee Nova, MD  albuterol (PROVENTIL HFA;VENTOLIN HFA) 108 (90 Base) MCG/ACT inhaler Inhale 2  puffs into the lungs every 6 (six) hours as needed for wheezing or shortness of breath. 11/16/15   Roselee Nova, MD  ibuprofen (ADVIL,MOTRIN) 200 MG tablet Take 200 mg by mouth every 6 (six) hours as needed for mild pain.     [provider]   Ct Hand Right W Contrast  Result Date: 02/08/2017 CLINICAL DATA:  Pt to ED reporting she woke up this morning and noted that her right thumb was stiff. Later she noted a bruised area and redness and swelling in right hand. pT now has a red streak running up right arm that reaches the upper right arm. EXAM: CT OF THE UPPER RIGHT EXTREMITY WITH CONTRAST TECHNIQUE: Multidetector CT imaging of the upper right extremity was performed according to the standard protocol following intravenous contrast administration. COMPARISON:  None. CONTRAST:  70mL ISOVUE-370 IOPAMIDOL (ISOVUE-370) INJECTION 76% FINDINGS: Bones/Joint/Cartilage No osseous fracture or dislocation seen. No destructive change or focal demineralization to suggest osteomyelitis. Ligaments Suboptimally assessed by CT. Muscles and Tendons No intramuscular fluid collection or abscess collection seen. No obvious tendon disruption. Soft tissues Edema within the subcutaneous soft tissues overlying the distal phalanx of the right thumb. Tiny hypodense focus that appears to be contiguous with the skin surface overlying the tuft of the distal phalanx may represent a small abscess, measuring approximately 4 mm greatest dimension (series 7, image 70; series 8, image 65). No soft tissue gas identified. IMPRESSION: 1. Ill-defined edema within the subcutaneous soft tissues overlying the distal phalanx of the right thumb. Tiny hypodense focus within the superficial soft tissues overlying the tuft of the distal phalanx is suspicious for small abscess, likely contiguous with the skin surface, measuring approximately 4 mm greatest dimension. 2. No soft tissue gas identified. 3. No evidence of osteomyelitis. 4. Consider  MRI for more definitive characterization of the soft tissues of the right hand and arm. These results will be called to the ordering clinician or representative by the Radiologist Assistant, and communication documented in the PACS or zVision Dashboard. Electronically Signed   By: Franki Cabot M.D.   On: 02/08/2017 20:44   Dg Hand Complete Right  Result Date: 02/08/2017 CLINICAL DATA:  Cellulitis, swelling EXAM: RIGHT HAND - COMPLETE 3+ VIEW COMPARISON:  None. FINDINGS: Mild joint space narrowing in the IP joints. Early spurring. No acute bony abnormality. Specifically, no fracture, subluxation, or dislocation. Soft tissues unremarkable. IMPRESSION: No acute bony abnormality. Electronically Signed   By: Rolm Baptise M.D.   On: 02/08/2017 18:33    Positive ROS: All other systems have been reviewed and were otherwise negative with the exception of those mentioned in the HPI  and as above.  Physical Exam: General: Alert, no acute distress Cardiovascular: No pedal edema Respiratory: No cyanosis, no use of accessory musculature GI: No organomegaly, abdomen is soft and non-tender Skin: No lesions in the area of chief complaint Neurologic: Sensation intact distally Psychiatric: Patient is competent for consent with normal mood and affect Lymphatic: No axillary or cervical lymphadenopathy  MUSCULOSKELETAL: +erythema, warmth, swelling with induration along the IP joint of the right thumb, there is erythema and swelling over the thenar eminence and erythema extending proximally into the volar forearm. She has pain with passive and active movement of the thumb and painless motion at the wrist, elbow and shoulder  Assessment: Deep abscess of the right thumb and thenar space  Plan: We discussed the treatment options, however the swelling and erythema have worsened significantly despite IV antibiotics. She will be taken urgently to the operating room for irrigation and debridement of the thumb, hand and  potentially the forearm.   The diagnosis, risks, benefits and alternatives to treatment are all discussed in detail with the patient and family. Risks include but are not limited to bleeding, continued infection, deep vein thrombosis, pulmonary embolism, nerve or vascular injury, repeat operation, persistent pain, weakness, stiffness and death. She understands and is eager to proceed.     Lovell Sheehan, MD    02/09/2017 8:30 AM

## 2017-02-09 NOTE — Progress Notes (Signed)
ID E note - I am off site this PM but have reviewed notes, labs, vitals and imaging.   Admitted with R thumb swelling and pain and found to have fevers 101 and wbc 14. CT showed abscess. Had I and D done just this AM with Gram stain showing GPC. [place]itt neg.  She is allergic to penicillins.  Most likely staph or strep.   Rec Cont vancomycin Can dc aztreonam. Would add levo until culture returns

## 2017-02-09 NOTE — Anesthesia Postprocedure Evaluation (Signed)
Anesthesia Post Note  Patient: Tamara Mcclure  Procedure(s) Performed: IRRIGATION AND DEBRIDEMENT EXTREMITY (Right )  Patient location during evaluation: PACU Anesthesia Type: General Level of consciousness: awake and alert Pain management: pain level controlled Vital Signs Assessment: post-procedure vital signs reviewed and stable Respiratory status: spontaneous breathing, nonlabored ventilation, respiratory function stable and patient connected to nasal cannula oxygen Cardiovascular status: blood pressure returned to baseline and stable Postop Assessment: no apparent nausea or vomiting Anesthetic complications: no     Last Vitals:  Vitals:   02/09/17 1130 02/09/17 1200  BP: 98/78 118/69  Pulse: 83 100  Resp: 10 12  Temp: 37.4 C 37.4 C  SpO2: 100% 95%    Last Pain:  Vitals:   02/09/17 1200  TempSrc: Oral  PainSc: 3                  Gregory Barrick S

## 2017-02-09 NOTE — Anesthesia Post-op Follow-up Note (Signed)
Anesthesia QCDR form completed.        

## 2017-02-09 NOTE — Anesthesia Procedure Notes (Signed)
Procedure Name: LMA Insertion Date/Time: 02/09/2017 9:47 AM Performed by: Silvana Newness, CRNA Pre-anesthesia Checklist: Patient identified, Emergency Drugs available, Suction available, Patient being monitored and Timeout performed Patient Re-evaluated:Patient Re-evaluated prior to induction Oxygen Delivery Method: Circle system utilized Preoxygenation: Pre-oxygenation with 100% oxygen Induction Type: IV induction Ventilation: Mask ventilation without difficulty LMA: LMA inserted LMA Size: 4.0 Number of attempts: 1 Placement Confirmation: positive ETCO2 and breath sounds checked- equal and bilateral Tube secured with: Tape Dental Injury: Teeth and Oropharynx as per pre-operative assessment

## 2017-02-10 ENCOUNTER — Ambulatory Visit: Payer: Medicare HMO | Admitting: Family Medicine

## 2017-02-10 LAB — CBC
HCT: 28.7 % — ABNORMAL LOW (ref 35.0–47.0)
HEMOGLOBIN: 9.8 g/dL — AB (ref 12.0–16.0)
MCH: 31.2 pg (ref 26.0–34.0)
MCHC: 34.1 g/dL (ref 32.0–36.0)
MCV: 91.6 fL (ref 80.0–100.0)
PLATELETS: 275 10*3/uL (ref 150–440)
RBC: 3.13 MIL/uL — AB (ref 3.80–5.20)
RDW: 14.2 % (ref 11.5–14.5)
WBC: 19.4 10*3/uL — ABNORMAL HIGH (ref 3.6–11.0)

## 2017-02-10 LAB — COMPREHENSIVE METABOLIC PANEL
ALBUMIN: 3.1 g/dL — AB (ref 3.5–5.0)
ALT: 15 U/L (ref 14–54)
AST: 22 U/L (ref 15–41)
Alkaline Phosphatase: 74 U/L (ref 38–126)
Anion gap: 8 (ref 5–15)
BUN: 9 mg/dL (ref 6–20)
CHLORIDE: 96 mmol/L — AB (ref 101–111)
CO2: 21 mmol/L — ABNORMAL LOW (ref 22–32)
CREATININE: 0.52 mg/dL (ref 0.44–1.00)
Calcium: 8.5 mg/dL — ABNORMAL LOW (ref 8.9–10.3)
GFR calc non Af Amer: >60 mL/min (ref >60)
GLUCOSE: 100 mg/dL — AB (ref 65–99)
Potassium: 4 mmol/L (ref 3.5–5.1)
SODIUM: 125 mmol/L — AB (ref 135–145)
Total Bilirubin: 0.4 mg/dL (ref 0.3–1.2)
Total Protein: 6.1 g/dL — ABNORMAL LOW (ref 6.5–8.1)

## 2017-02-10 LAB — HIV ANTIBODY (ROUTINE TESTING W REFLEX): HIV Screen 4th Generation wRfx: NONREACTIVE

## 2017-02-10 MED ORDER — SODIUM CHLORIDE 0.9 % IV SOLN
INTRAVENOUS | Status: AC
  Administered 2017-02-10 (×2): via INTRAVENOUS

## 2017-02-10 MED ORDER — FLUTICASONE FUROATE-VILANTEROL 100-25 MCG/INH IN AEPB
1.0000 | INHALATION_SPRAY | Freq: Every day | RESPIRATORY_TRACT | Status: DC
  Administered 2017-02-10 – 2017-02-11 (×2): 1 via RESPIRATORY_TRACT
  Filled 2017-02-10: qty 28

## 2017-02-10 MED ORDER — CLINDAMYCIN HCL 150 MG PO CAPS
300.0000 mg | ORAL_CAPSULE | Freq: Four times a day (QID) | ORAL | Status: DC
  Administered 2017-02-10 – 2017-02-11 (×4): 300 mg via ORAL
  Filled 2017-02-10 (×7): qty 2

## 2017-02-10 MED ORDER — CEFAZOLIN SODIUM-DEXTROSE 1-4 GM/50ML-% IV SOLN
1.0000 g | Freq: Three times a day (TID) | INTRAVENOUS | Status: DC
  Filled 2017-02-10: qty 50

## 2017-02-10 MED ORDER — DEXTROSE 5 % IV SOLN
1.0000 g | Freq: Three times a day (TID) | INTRAVENOUS | Status: DC
  Administered 2017-02-10 – 2017-02-11 (×4): 1 g via INTRAVENOUS
  Filled 2017-02-10 (×6): qty 10

## 2017-02-10 MED ORDER — PREGABALIN 75 MG PO CAPS
75.0000 mg | ORAL_CAPSULE | Freq: Three times a day (TID) | ORAL | Status: DC
  Administered 2017-02-10 – 2017-02-11 (×4): 75 mg via ORAL
  Filled 2017-02-10 (×4): qty 1

## 2017-02-10 NOTE — Op Note (Signed)
  02/09/2017  12:40 PM  PATIENT:  Tamara Mcclure    PRE-OPERATIVE DIAGNOSIS:  infected right thumb  POST-OPERATIVE DIAGNOSIS:  Same  PROCEDURE:  IRRIGATION AND DEBRIDEMENT EXTREMITY, Right thumb flexor and thenar space on the right  SURGEON:  Lovell Sheehan, MD  ANESTHESIA:   General  PREOPERATIVE INDICATIONS:  CIAIRA NATIVIDAD is a  62 y.o. female with a diagnosis of infected right thumb who  lected for urgent surgical management of the fracture  The risks benefits and alternatives were discussed with the patient preoperatively including but not limited to the risks of infection, bleeding, nerve injury, cardiopulmonary complications, the need for revision surgery, among others, and the patient was willing to proceed.  EBL: 20 CC  TOURNIQUET TIME: 28 MIN at 250 mmHg  DRAINS: two vessel loops, one in the radial thumb bursa and a second in the thenar space  OPERATIVE FINDINGS: gross purulence was identified in the thumb flexor sheath and bursa, as well as small amounts in the thenar space  OPERATIVE PROCEDURE: After informed consent was obtained and the appropriate extremity marked in the pre-operative holding area, the patient was taken urgently to the operating. General anesthesia was induced. The right arm was elevated and the tourniquet insufflated. A Bruner incision was carried out starting at the radial aspect of the distal phalynx and extended to the MP crease. Care was taken to protect the neurovascular structures at all times. The sheath was opened and irrigated with an angiocath with copious amounts of bacitracin laced normal saline. A curvilinear incision was then made parallel to the longitudinal crease of the volar hand approximately 3 cm in length. The APB and FPB fascia was identified and unroofed. Blunt dissection was carried out in to the thenar space. This was also irrigated with bacitracin laced saline. The tourniquet was deflated, there was good return of blood flow.  Hemostasis was achieved with compression and the incisions closed loosely with 4-0 Nylon with two vessel loops left deep within each incision. She tolerated the procedure well there were no apparent complications. She was taken to the recovery room in good condition.  She is non-weight bearing with the right hand and will follow strict elevation.    Elyn Aquas. Harlow Mares, MD

## 2017-02-10 NOTE — Consult Note (Signed)
[facility] Infectious Disease     Reason for Consult:Hand abscess    Referring Physician: Gouru, A Date of Admission:  02/08/2017   Active Problems:   Sepsis [facility])   HPI: NAVADA OSTERHOUT is a 62 y.o. female  admitted with R thumb swelling and pain and found to have fevers 101 and wbc 14. CT showed abscess. Had I and D done just this 1/31 with Gram stain showing GPC. [place] neg.   She is allergic to penicillins so was placed on vancomycin and aztreonam.     Past Medical History:  Diagnosis Date  . Anxiety   . Chronic lower back pain   . COPD (chronic obstructive pulmonary disease) ([place])   . Depression   . Hypertension   . Panic disorder with agoraphobia and severe panic attacks   . Peripheral sensory neuropathy    Past Surgical History:  Procedure Laterality Date  . I&D EXTREMITY Right 02/09/2017   Procedure: IRRIGATION AND DEBRIDEMENT EXTREMITY;  Surgeon: Lovell Sheehan, MD;  Location: [facility] ORS;  Service: Orthopedics;  Laterality: Right;  . none     Social History   Tobacco Use  . Smoking status: Current Every Day Smoker    Packs/day: 1.00    Years: 30.00    Pack years: 30.00    Types: Cigarettes  . Smokeless tobacco: Never Used  Substance Use Topics  . Alcohol use: No  . Drug use: No   Family History  Problem Relation Age of Onset  . Cancer Mother        Lung  . Cancer Father        pancreatic  . Breast cancer Neg Hx   . Ovarian cancer Neg Hx   . Colon cancer Neg Hx   . Diabetes Neg Hx   . Heart disease Neg Hx     Allergies:  Allergies  Allergen Reactions  . Penicillins Rash    Has patient had a PCN reaction causing immediate rash, facial/tongue/throat swelling, SOB or lightheadedness with hypotension: Yes Has patient had a PCN reaction causing severe rash involving mucus membranes or skin necrosis: No Has patient had a PCN reaction that required hospitalization: No Has patient had a PCN reaction occurring within the last 10 years: No If all of the  above answers are "NO", then may proceed with Cephalosporin use.    Current antibiotics: Antibiotics Given (last 72 hours)    Date/Time Action Medication Dose Rate   02/08/17 1904 New Bag/Given  [Delayed due to difficulty obtaining cultures]   aztreonam (AZACTAM) 2 g in dextrose 5 % 50 mL IVPB 2 g 100 mL/hr   02/08/17 2214 New Bag/Given   meropenem (MERREM) 1 g in sodium chloride 0.9 % 100 mL IVPB 1 g 200 mL/hr   02/08/17 2358 New Bag/Given   vancomycin (VANCOCIN) IVPB 750 mg/150 ml premix 750 mg 150 mL/hr   02/09/17 0234 New Bag/Given   meropenem (MERREM) 1 g in sodium chloride 0.9 % 100 mL IVPB 1 g 200 mL/hr   02/09/17 1106 Given   50,000 units bacitracin in 0.9% normal saline 250 mL irrigation 1,000 mL    02/09/17 1638 New Bag/Given   levofloxacin (LEVAQUIN) IVPB 750 mg 750 mg 100 mL/hr   02/09/17 1847 New Bag/Given   vancomycin (VANCOCIN) IVPB 750 mg/150 ml premix 750 mg 150 mL/hr   02/10/17 1245 New Bag/Given   vancomycin (VANCOCIN) IVPB 750 mg/150 ml premix 750 mg 150 mL/hr      MEDICATIONS: . carisoprodol  350 mg Oral TID  . docusate sodium  100 mg Oral BID  . fluticasone furoate-vilanterol  1 puff Inhalation Daily  . ketorolac  15 mg Intravenous Q6H  . potassium chloride  10 mEq Oral Daily  . pregabalin  75 mg Oral TID  . QUEtiapine  100 mg Oral QHS  . senna  1 tablet Oral BID    Review of Systems - 11 systems reviewed and negative per HPI   OBJECTIVE: Temp:  [97.6 F (36.4 C)-101.8 F (38.8 C)] 97.7 F (36.5 C) (02/01 1239) Pulse Rate:  [73-116] 90 (02/01 1239) Resp:  [16-18] 16 (02/01 1239) BP: (110-120)/(60-83) 111/63 (02/01 1239) SpO2:  [93 %-99 %] 97 % (02/01 1239) Physical Exam  Constitutional:  oriented to person, place, and time. appears well-developed and well-nourished. No distress.  HENT: [place]/AT, PERRLA, no scleral icterus Mouth/Throat: Oropharynx is clear and moist. No oropharyngeal exudate.  Cardiovascular: Normal rate, regular rhythm and  normal heart sounds. Pulmonary/Chest: Effort normal and breath sounds normal. No respiratory distress.  has no wheezes.  Neck = supple, no nuchal rigidity Abdominal: Soft. Bowel sounds are normal.  exhibits no distension. There is no tenderness.  Lymphadenopathy: no cervical adenopathy. No axillary adenopathy Neurological: alert and oriented to person, place, and time.  EXT R arm with 1+ edema to elbow.  Skin: R hand wrapped but thumb has some discoloration. Has mild erythema up to elbow.  Psychiatric: a normal mood and affect.  behavior is normal.    LABS: Results for orders placed or performed during the hospital encounter of 02/08/17 (from the past 48 hour(s))  Lactic acid, plasma     Status: None   Collection Time: 02/08/17  3:54 PM  Result Value Ref Range   Lactic Acid, Venous 0.7 0.5 - 1.9 mmol/L    Comment: Performed at [facility], [address] [place], [place] [address]  Comprehensive metabolic panel     Status: Abnormal   Collection Time: 02/08/17  3:54 PM  Result Value Ref Range   Sodium 133 (L) 135 - 145 mmol/L   Potassium 4.2 3.5 - 5.1 mmol/L   Chloride 96 (L) 101 - 111 mmol/L   CO2 27 22 - 32 mmol/L   Glucose, Bld 100 (H) 65 - 99 mg/dL   BUN 7 6 - 20 mg/dL   Creatinine, Ser 0.53 0.44 - 1.00 mg/dL   Calcium 9.5 8.9 - 10.3 mg/dL   Total Protein 8.5 (H) 6.5 - 8.1 g/dL   Albumin 4.5 3.5 - 5.0 g/dL   AST 22 15 - 41 U/L   ALT 16 14 - 54 U/L   Alkaline Phosphatase 107 38 - 126 U/L   Total Bilirubin 0.4 0.3 - 1.2 mg/dL   GFR calc non Af Amer >60 >60 mL/min   GFR calc Af Amer >60 >60 mL/min    Comment: (NOTE) The eGFR has been calculated using the CKD EPI equation. This calculation has not been validated in all clinical situations. eGFR's persistently <60 mL/min signify possible Chronic Kidney Disease.    Anion gap 10 5 - 15    Comment: Performed at [facility], [address] [place], [place] [address]  CBC with Differential     Status:  Abnormal   Collection Time: 02/08/17  3:54 PM  Result Value Ref Range   WBC 13.9 (H) 3.6 - 11.0 K/uL   RBC 4.31 3.80 - 5.20 MIL/uL   Hemoglobin 13.6 12.0 - 16.0 g/dL   HCT 39.3 35.0 -  47.0 %   MCV 91.1 80.0 - 100.0 fL   MCH 31.6 26.0 - 34.0 pg   MCHC 34.7 32.0 - 36.0 g/dL   RDW 14.1 11.5 - 14.5 %   Platelets 450 (H) 150 - 440 K/uL   Neutrophils Relative % 81 %   Neutro Abs 11.2 (H) 1.4 - 6.5 K/uL   Lymphocytes Relative 9 %   Lymphs Abs 1.3 1.0 - 3.6 K/uL   Monocytes Relative 9 %   Monocytes Absolute 1.2 (H) 0.2 - 0.9 K/uL   Eosinophils Relative 1 %   Eosinophils Absolute 0.1 0 - 0.7 K/uL   Basophils Relative 0 %   Basophils Absolute 0.1 0 - 0.1 K/uL    Comment: Performed at [facility], [address], [place], [place] [address]  Sedimentation rate     Status: None   Collection Time: 02/08/17  3:54 PM  Result Value Ref Range   Sed Rate 26 0 - 30 mm/hr    Comment: Performed at [facility], [address] [place], [place] [address]  Blood culture (routine x 2)     Status: None (Preliminary result)   Collection Time: 02/08/17  6:09 PM  Result Value Ref Range   Specimen Description BLOOD LEFT WRIST    Special Requests      BOTTLES DRAWN AEROBIC AND ANAEROBIC Blood Culture results may not be optimal due to an excessive volume of blood received in culture bottles   Culture      NO GROWTH 2 DAYS Performed at [facility], [address], [place], [place] [address]    Report Status PENDING   Blood culture (routine x 2)     Status: None (Preliminary result)   Collection Time: 02/08/17  6:09 PM  Result Value Ref Range   Specimen Description BLOOD LFOA    Special Requests      BOTTLES DRAWN AEROBIC AND ANAEROBIC Blood Culture results may not be optimal due to an excessive volume of blood received in culture bottles   Culture      NO GROWTH 2 DAYS Performed at [facility], [address] [place], [place] [address]    Report Status  PENDING   Procalcitonin     Status: None   Collection Time: 02/08/17  9:01 PM  Result Value Ref Range   Procalcitonin <0.10 ng/mL    Comment:        Interpretation: PCT (Procalcitonin) <= 0.5 ng/mL: Systemic infection (sepsis) is not likely. Local bacterial infection is possible. (NOTE)       Sepsis PCT Algorithm           Lower Respiratory Tract                                      Infection PCT Algorithm    ----------------------------     ----------------------------         PCT < 0.25 ng/mL                PCT < 0.10 ng/mL         Strongly encourage             Strongly discourage   discontinuation of antibiotics    initiation of antibiotics    ----------------------------     -----------------------------       PCT 0.25 - 0.50 ng/mL  PCT 0.10 - 0.25 ng/mL               OR       >80% decrease in PCT            Discourage initiation of                                            antibiotics      Encourage discontinuation           of antibiotics    ----------------------------     -----------------------------         PCT >= 0.50 ng/mL              PCT 0.26 - 0.50 ng/mL               AND        <80% decrease in PCT             Encourage initiation of                                             antibiotics       Encourage continuation           of antibiotics    ----------------------------     -----------------------------        PCT >= 0.50 ng/mL                  PCT > 0.50 ng/mL               AND         increase in PCT                  Strongly encourage                                      initiation of antibiotics    Strongly encourage escalation           of antibiotics                                     -----------------------------                                           PCT <= 0.25 ng/mL                                                 OR                                        > 80% decrease in PCT  Discontinue / Do  not initiate                                             antibiotics Performed at [facility], [address] [place], [place] [address]   Protime-INR     Status: None   Collection Time: 02/08/17  9:01 PM  Result Value Ref Range   Prothrombin Time 12.3 11.4 - 15.2 seconds   INR 0.92     Comment: Performed at [facility], [address] [place], [place] [address]  APTT     Status: Abnormal   Collection Time: 02/08/17  9:01 PM  Result Value Ref Range   aPTT 38 (H) 24 - 36 seconds    Comment:        IF BASELINE aPTT IS ELEVATED, SUGGEST PATIENT RISK ASSESSMENT BE USED TO DETERMINE APPROPRIATE ANTICOAGULANT THERAPY. Performed at [facility], [address] [place], [place] [address]   Basic metabolic panel     Status: Abnormal   Collection Time: 02/09/17  3:55 AM  Result Value Ref Range   Sodium 133 (L) 135 - 145 mmol/L   Potassium 4.0 3.5 - 5.1 mmol/L   Chloride 102 101 - 111 mmol/L   CO2 22 22 - 32 mmol/L   Glucose, Bld 96 65 - 99 mg/dL   BUN 8 6 - 20 mg/dL   Creatinine, Ser 0.77 0.44 - 1.00 mg/dL   Calcium 8.6 (L) 8.9 - 10.3 mg/dL   GFR calc non Af Amer >60 >60 mL/min   GFR calc Af Amer >60 >60 mL/min    Comment: (NOTE) The eGFR has been calculated using the CKD EPI equation. This calculation has not been validated in all clinical situations. eGFR's persistently <60 mL/min signify possible Chronic Kidney Disease.    Anion gap 9 5 - 15    Comment: Performed at [facility], [address] [place], [place] [address]  CBC     Status: Abnormal   Collection Time: 02/09/17  3:55 AM  Result Value Ref Range   WBC 13.8 (H) 3.6 - 11.0 K/uL   RBC 3.75 (L) 3.80 - 5.20 MIL/uL   Hemoglobin 11.6 (L) 12.0 - 16.0 g/dL   HCT 34.2 (L) 35.0 - 47.0 %   MCV 91.3 80.0 - 100.0 fL   MCH 31.0 26.0 - 34.0 pg   MCHC 34.0 32.0 - 36.0 g/dL   RDW 14.4 11.5 - 14.5 %   Platelets 342 150 - 440 K/uL    Comment: Performed at [facility], [address] [place], [place] [address]  HIV antibody     Status: None   Collection Time: 02/09/17  3:55 AM  Result Value Ref Range   HIV Screen 4th Generation wRfx Non Reactive Non Reactive    Comment: (NOTE) Performed At: [facility], [place] [id number] Rush Farmer MD [id number] Performed at [facility], [address] [place], [place] [address]   Aerobic/Anaerobic Culture (surgical/deep wound)     Status: None (Preliminary result)   Collection Time: 02/09/17 10:12 AM  Result Value Ref Range   Specimen Description      THUMB RIGHT Performed at [facility], [place] [address] [place], [place] [address]    Special Requests      NONE Performed at [facility], [phone] [place], [place] [address]    Gram Stain      RARE WBC PRESENT, PREDOMINANTLY PMN MODERATE GRAM POSITIVE COCCI    Culture      ABUNDANT UNIDENTIFIED ORGANISM Performed at [facility], [place] [address] [place], [place] [address]    Report Status PENDING   Comprehensive metabolic panel     Status: Abnormal   Collection Time: 02/10/17  3:38 AM  Result Value Ref Range   Sodium 125 (L) 135 - 145 mmol/L   Potassium 4.0 3.5 - 5.1 mmol/L   Chloride 96 (L) 101 - 111 mmol/L   CO2 21 (L) 22 - 32 mmol/L   Glucose, Bld 100 (H) 65 - 99 mg/dL   BUN 9 6 - 20 mg/dL   Creatinine, Ser 0.52 0.44 - 1.00 mg/dL   Calcium 8.5 (L) 8.9 - 10.3 mg/dL   Total Protein 6.1 (L) 6.5 - 8.1 g/dL   Albumin 3.1 (L) 3.5 - 5.0 g/dL   AST 22 15 - 41 U/L   ALT 15 14 - 54 U/L   Alkaline Phosphatase 74 38 - 126 U/L   Total Bilirubin 0.4 0.3 - 1.2 mg/dL   GFR calc non Af Amer >60 >60 mL/min   GFR calc Af Amer >60 >60 mL/min    Comment: (NOTE) The eGFR has been calculated using the CKD EPI equation. This calculation has not been validated in all clinical situations. eGFR's persistently <60 mL/min signify possible Chronic Kidney Disease.    Anion gap 8 5 - 15     Comment: Performed at [facility], [address] [place], [place] [address]  CBC     Status: Abnormal   Collection Time: 02/10/17  3:38 AM  Result Value Ref Range   WBC 19.4 (H) 3.6 - 11.0 K/uL   RBC 3.13 (L) 3.80 - 5.20 MIL/uL   Hemoglobin 9.8 (L) 12.0 - 16.0 g/dL   HCT 28.7 (L) 35.0 - 47.0 %   MCV 91.6 80.0 - 100.0 fL   MCH 31.2 26.0 - 34.0 pg   MCHC 34.1 32.0 - 36.0 g/dL   RDW 14.2 11.5 - 14.5 %   Platelets 275 150 - 440 K/uL    Comment: Performed at [facility], [address] [place], [place] [address]   No components found for: ESR, C REACTIVE PROTEIN MICRO: Recent Results (from the past 720 hour(s))  Blood culture (routine x 2)     Status: None (Preliminary result)   Collection Time: 02/08/17  6:09 PM  Result Value Ref Range Status   Specimen Description BLOOD LEFT WRIST  Final   Special Requests   Final    BOTTLES DRAWN AEROBIC AND ANAEROBIC Blood Culture results may not be optimal due to an excessive volume of blood received in culture bottles   Culture   Final    NO GROWTH 2 DAYS Performed at [facility], [address], [place], [place] [address]    Report Status PENDING  Incomplete  Blood culture (routine x 2)     Status: None (Preliminary result)   Collection Time: 02/08/17  6:09 PM  Result Value Ref Range Status   Specimen Description BLOOD LFOA  Final   Special Requests   Final    BOTTLES DRAWN AEROBIC AND ANAEROBIC Blood Culture results may not be optimal due to an excessive volume of blood received in culture bottles   Culture   Final    NO GROWTH 2 DAYS Performed  at [facility], [address] [place], [place] [address]    Report Status PENDING  Incomplete  Aerobic/Anaerobic Culture (surgical/deep wound)     Status: None (Preliminary result)   Collection Time: 02/09/17 10:12 AM  Result Value Ref Range Status   Specimen Description   Final    THUMB RIGHT Performed at [facility], [place] [address],  [place], [place] [address]    Special Requests   Final    NONE Performed at [facility], [place],  [address]    Gram Stain   Final    RARE WBC PRESENT, PREDOMINANTLY PMN MODERATE GRAM POSITIVE COCCI    Culture   Final    ABUNDANT UNIDENTIFIED ORGANISM Performed at [facility], [place] [address], [place],  [address]    Report Status PENDING  Incomplete    IMAGING: Ct Hand Right W Contrast  Result Date: 02/08/2017 CLINICAL DATA:  Pt to ED reporting she woke up this morning and noted that her right thumb was stiff. Later she noted a bruised area and redness and swelling in right hand. pT now has a red streak running up right arm that reaches the upper right arm. EXAM: CT OF THE UPPER RIGHT EXTREMITY WITH CONTRAST TECHNIQUE: Multidetector CT imaging of the upper right extremity was performed according to the standard protocol following intravenous contrast administration. COMPARISON:  None. CONTRAST:  85m ISOVUE-370 IOPAMIDOL (ISOVUE-370) INJECTION 76% FINDINGS: Bones/Joint/Cartilage No osseous fracture or dislocation seen. No destructive change or focal demineralization to suggest osteomyelitis. Ligaments Suboptimally assessed by CT. Muscles and Tendons No intramuscular fluid collection or abscess collection seen. No obvious tendon disruption. Soft tissues Edema within the subcutaneous soft tissues overlying the distal phalanx of the right thumb. Tiny hypodense focus that appears to be contiguous with the skin surface overlying the tuft of the distal phalanx may represent a small abscess, measuring approximately 4 mm greatest dimension (series 7, image 70; series 8, image 65). No soft tissue gas identified. IMPRESSION: 1. Ill-defined edema within the subcutaneous soft tissues overlying the distal phalanx of the right thumb. Tiny hypodense focus within the superficial soft tissues overlying the tuft of the distal phalanx is suspicious for small abscess, likely  contiguous with the skin surface, measuring approximately 4 mm greatest dimension. 2. No soft tissue gas identified. 3. No evidence of osteomyelitis. 4. Consider MRI for more definitive characterization of the soft tissues of the right hand and arm. These results will be called to the ordering clinician or representative by the Radiologist Assistant, and communication documented in the PACS or zVision Dashboard. Electronically Signed   By: SFranki CabotM.D.   On: 02/08/2017 20:44   Dg Hand Complete Right  Result Date: 02/08/2017 CLINICAL DATA:  Cellulitis, swelling EXAM: RIGHT HAND - COMPLETE 3+ VIEW COMPARISON:  None. FINDINGS: Mild joint space narrowing in the IP joints. Early spurring. No acute bony abnormality. Specifically, no fracture, subluxation, or dislocation. Soft tissues unremarkable. IMPRESSION: No acute bony abnormality. Electronically Signed   By: KRolm BaptiseM.D.   On: 02/08/2017 18:33    Assessment:   DERICIA MOXLEYis a 62y.o. female admitted with a R hand cellulitis and abscess now s/p I and D 1/31. Cx is growing Grp A strep. She is penicillin allergic. She had a rapid onset consistent with the Grp A strep. HIV neg.  She reports she had a rash to PCN as a child but does not know any details.  Cannot recall any swelling or anaphylaxis. Will change from vanco and levo to ancef. Given the fevers and leukocytosis will add clinda for now in case of toxin production.   Recommendations DC Vanco, levo Start ancef  Add clinda orally for a few days.  If improving in next 1-2 days can dc on oral keflex 500 mg QID for a 10 day course with close follow up with surgery and myself.   Thank you very much for allowing me to participate in the care of this patient. Please call with questions.   Cheral Marker. Ola Spurr, MD

## 2017-02-10 NOTE — Progress Notes (Signed)
Pharmacy Antibiotic Note  Tamara Mcclure is a 62 y.o. female admitted on 02/08/2017 with cellulitis. ID consulted.  Pharmacy has been consulted for Cefazolin dosing.  Patient is also being started on PO clindamycin. Vancomycin and levofloxacin have been discontined.   Plan: Start Cefazolin 1g IV every 8 hours.   Height: 5\' 5"  (165.1 cm) Weight: 140 lb 8 oz (63.7 kg) IBW/kg (Calculated) : 57  Temp (24hrs), Avg:98.7 F (37.1 C), Min:97.6 F (36.4 C), Max:101.8 F (38.8 C)  Recent Labs  Lab 02/08/17 1554 02/09/17 0355 02/10/17 0338  WBC 13.9* 13.8* 19.4*  CREATININE 0.53 0.77 0.52  LATICACIDVEN 0.7  --   --     Estimated Creatinine Clearance: 67.3 mL/min (by C-G formula based on SCr of 0.52 mg/dL).    Allergies  Allergen Reactions  . Penicillins Rash    Has patient had a PCN reaction causing immediate rash, facial/tongue/throat swelling, SOB or lightheadedness with hypotension: Yes Has patient had a PCN reaction causing severe rash involving mucus membranes or skin necrosis: No Has patient had a PCN reaction that required hospitalization: No Has patient had a PCN reaction occurring within the last 10 years: No If all of the above answers are "NO", then may proceed with Cephalosporin use.    Antimicrobials this admission: Anti-infectives (From admission, onward)   Start     Dose/Rate Route Frequency Ordered Stop   02/10/17 1800  ceFAZolin (ANCEF) IVPB 1 g/50 mL premix     1 g 100 mL/hr over 30 Minutes Intravenous Every 8 hours 02/10/17 1405     02/10/17 1400  clindamycin (CLEOCIN) capsule 300 mg     300 mg Oral Every 6 hours 02/10/17 1337     02/09/17 1730  vancomycin (VANCOCIN) IVPB 750 mg/150 ml premix  Status:  Discontinued     750 mg 150 mL/hr over 60 Minutes Intravenous Every 12 hours 02/09/17 1617 02/10/17 1335   02/09/17 1700  levofloxacin (LEVAQUIN) IVPB 750 mg  Status:  Discontinued     750 mg 100 mL/hr over 90 Minutes Intravenous Daily-1800 02/09/17 1602  02/10/17 1335   02/09/17 1106  50,000 units bacitracin in 0.9% normal saline 250 mL irrigation  Status:  Discontinued       As needed 02/09/17 1107 02/09/17 1107   02/08/17 2300  vancomycin (VANCOCIN) IVPB 750 mg/150 ml premix  Status:  Discontinued     750 mg 150 mL/hr over 60 Minutes Intravenous Every 12 hours 02/08/17 1816 02/09/17 1214   02/08/17 2200  meropenem (MERREM) 1 g in sodium chloride 0.9 % 100 mL IVPB  Status:  Discontinued     1 g 200 mL/hr over 30 Minutes Intravenous Every 8 hours 02/08/17 1844 02/09/17 1214   02/08/17 1730  aztreonam (AZACTAM) 2 g in dextrose 5 % 50 mL IVPB     2 g 100 mL/hr over 30 Minutes Intravenous  Once 02/08/17 1715 02/08/17 1934   02/08/17 1730  metroNIDAZOLE (FLAGYL) IVPB 500 mg  Status:  Discontinued     500 mg 100 mL/hr over 60 Minutes Intravenous  Once 02/08/17 1715 02/09/17 0922   02/08/17 1730  vancomycin (VANCOCIN) IVPB 1000 mg/200 mL premix  Status:  Discontinued     1,000 mg 200 mL/hr over 60 Minutes Intravenous  Once 02/08/17 1715 02/09/17 6063      Microbiology results: Recent Results (from the past 240 hour(s))  Blood culture (routine x 2)     Status: None (Preliminary result)   Collection Time: 02/08/17  6:09 PM  Result Value Ref Range Status   Specimen Description BLOOD LEFT WRIST  Final   Special Requests   Final    BOTTLES DRAWN AEROBIC AND ANAEROBIC Blood Culture results may not be optimal due to an excessive volume of blood received in culture bottles   Culture   Final    NO GROWTH 2 DAYS Performed at [facility], [address], [place], [place] [address]    Report Status PENDING  Incomplete  Blood culture (routine x 2)     Status: None (Preliminary result)   Collection Time: 02/08/17  6:09 PM  Result Value Ref Range Status   Specimen Description BLOOD LFOA  Final   Special Requests   Final    BOTTLES DRAWN AEROBIC AND ANAEROBIC Blood Culture results may not be optimal due to an excessive volume of blood  received in culture bottles   Culture   Final    NO GROWTH 2 DAYS Performed at [facility], [address] [place], [place] [address]    Report Status PENDING  Incomplete  Aerobic/Anaerobic Culture (surgical/deep wound)     Status: None (Preliminary result)   Collection Time: 02/09/17 10:12 AM  Result Value Ref Range Status   Specimen Description   Final    THUMB RIGHT Performed at [facility], [place] [address] [place], [place] [address]    Special Requests   Final    NONE Performed at [facility], [place], [place] [address]    Gram Stain   Final    RARE WBC PRESENT, PREDOMINANTLY PMN MODERATE GRAM POSITIVE COCCI Performed at [facility], [place] [address], [place], [place] [address]    Culture ABUNDANT GROUP A STREP (S.PYOGENES) ISOLATED  Final   Report Status PENDING  Incomplete    Thank you for allowing pharmacy to be a part of this patient's care.  Pernell Dupre, PharmD, BCPS Clinical Pharmacist 02/10/2017 2:05 PM

## 2017-02-10 NOTE — Progress Notes (Signed)
Occupational Therapy Treatment Patient Details Name: Tamara Mcclure MRN: [id number] DOB: 1955/12/26 Today's Date: 02/10/2017    History of present illness 62yo female pt s/p I&D of R thumb/hand/forearm on 1/31 secondary to infection (RUE [place]). PMHx significant for current smoker, HTN, COPD, chronic lower back pain, anxiety, severe panic disorder, and peripheral neuropathy.    OT comments  Pt. education was provided about work simplification strategies, one armed techniques, and positioning, and A/E. Pt. Becomes tearful at times regarding her current medical situation. Pt. Was provided with therapeutic listening, and encouragement. Pt. Could benefit from OT services for ADL training, A/E training, and pt. Education about work simplification strategies, home modification, and DME. Pt. Continues to plan to return home upon discharge. Pt. Could benefit from follow-up OT services upon discharge.   Follow Up Recommendations  Home health OT    Equipment Recommendations  Other (comment)    Recommendations for Other Services      Precautions / Restrictions Restrictions Weight Bearing Restrictions: Yes RUE Weight Bearing: Non weight bearing                                                     ADL either performed or assessed with clinical judgement   ADL Overall ADL's : Needs assistance/impaired Eating/Feeding: Modified independent;Sitting   Grooming: Sitting;Modified independent   Upper Body Bathing: Sitting;Set up;Supervision/ safety   Lower Body Bathing: Sit to/from stand;Supervison/ safety   Upper Body Dressing : Sitting;Minimal assistance   Lower Body Dressing: Supervision/safety                 General ADL Comments: Pt. edcuation was provided about one armed strategies, and work simplification techniques.     Vision Wears Glasses: Reading only Patient Visual Report: No change from baseline     Perception     Praxis      Cognition  Arousal/Alertness: Awake/alert Behavior During Therapy: Anxious Overall Cognitive Status: Within Functional Limits for tasks assessed                                 General Comments: Pt. reports it helps her to think her situation is temporary.        Exercises     Shoulder Instructions       General Comments      Pertinent Vitals/ Pain       Pain Assessment: 0-10 Pain Score: 5  Pain Location: RUE Pain Intervention(s): Limited activity within patient's tolerance;Monitored during session;Repositioned                                                          Frequency  Min 1X/week        Progress Toward Goals  OT Goals(current goals can now be found in the care plan section)  Progress towards OT goals: Progressing toward goals  Acute Rehab OT Goals Patient Stated Goal: To get better OT Goal Formulation: With patient Potential to Achieve Goals: Good  Plan      Co-evaluation  AM-PAC PT "6 Clicks" Daily Activity     Outcome Measure   Help from another person eating meals?: A Little Help from another person taking care of personal grooming?: A Little Help from another person toileting, which includes using toliet, bedpan, or urinal?: A Little Help from another person bathing (including washing, rinsing, drying)?: A Little Help from another person to put on and taking off regular upper body clothing?: A Little Help from another person to put on and taking off regular lower body clothing?: A Little 6 Click Score: 18    End of Session    OT Visit Diagnosis: Pain Pain - Right/Left: Right Pain - part of body: Arm   Activity Tolerance Patient tolerated treatment well   Patient Left in bed;with call bell/phone within reach;with bed alarm set   Nurse Communication Patient requests pain meds    Functional Assessment Tool Used: AM-PAC 6 Clicks Daily Activity   Time: 1432-1455 OT Time Calculation  (min): 23 min  Charges: OT G-codes **NOT FOR INPATIENT CLASS** Functional Assessment Tool Used: AM-PAC 6 Clicks Daily Activity OT General Charges $OT Visit: 1 Visit OT Treatments $Self Care/Home Management : 8-22 mins  Harrel Carina, MS, OTR/L    Harrel Carina, MS, OTR/L 02/10/2017, 4:30 PM

## 2017-02-10 NOTE — Progress Notes (Addendum)
[facility] NAME: Tamara Mcclure    MR#:  [id number]  DATE OF BIRTH:  1955-08-01  SUBJECTIVE:  CHIEF COMPLAINT:  Pt had rt hand  I n D , pain manageable  REVIEW OF SYSTEMS:  CONSTITUTIONAL: No fever, fatigue or weakness.  EYES: No blurred or double vision.  EARS, NOSE, AND THROAT: No tinnitus or ear pain.  RESPIRATORY: No cough, shortness of breath, wheezing or hemoptysis.  CARDIOVASCULAR: No chest pain, orthopnea, edema.  GASTROINTESTINAL: No nausea, vomiting, diarrhea or abdominal pain.  GENITOURINARY: No dysuria, hematuria.  ENDOCRINE: No polyuria, nocturia,  HEMATOLOGY: No anemia, easy bruising or bleeding SKIN: No rash or lesion. MUSCULOSKELETAL: rt hand pain     NEUROLOGIC: No tingling, numbness, weakness.  PSYCHIATRY: No anxiety or depression.   DRUG ALLERGIES:   Allergies  Allergen Reactions  . Penicillins Rash    Has patient had a PCN reaction causing immediate rash, facial/tongue/throat swelling, SOB or lightheadedness with hypotension: Yes Has patient had a PCN reaction causing severe rash involving mucus membranes or skin necrosis: No Has patient had a PCN reaction that required hospitalization: No Has patient had a PCN reaction occurring within the last 10 years: No If all of the above answers are "NO", then may proceed with Cephalosporin use.    VITALS:  Blood pressure 111/63, pulse 90, temperature 97.7 F (36.5 C), temperature source Oral, resp. rate 16, height 5\' 5"  (1.651 m), weight 63.7 kg (140 lb 8 oz), SpO2 97 %.  PHYSICAL EXAMINATION:  GENERAL:  62 y.o.-year-old patient lying in the bed with no acute distress.  EYES: Pupils equal, round, reactive to light and accommodation. No scleral icterus. Extraocular muscles intact.  HEENT: Head atraumatic, normocephalic. Oropharynx and nasopharynx clear.  NECK:  Supple, no jugular venous distention. No thyroid enlargement, no tenderness.  LUNGS: Normal  breath sounds bilaterally, no wheezing, rales,rhonchi or crepitation. No use of accessory muscles of respiration.  CARDIOVASCULAR: S1, S2 normal. No murmurs, rubs, or gallops.  ABDOMEN: Soft, nontender, nondistended. Bowel sounds present. No organomegaly or mass.  EXTREMITIES: Rt hand s/p incision and drainage No pedal edema, cyanosis, or clubbing.  NEUROLOGIC: Cranial nerves II through XII are intact. Muscle strength 5/5 in all extremities. Sensation intact. Gait not checked.  PSYCHIATRIC: The patient is alert and oriented x 3.  SKIN: No obvious rash, lesion, or ulcer.    LABORATORY PANEL:   CBC Recent Labs  Lab 02/10/17 0338  WBC 19.4*  HGB 9.8*  HCT 28.7*  PLT 275   ------------------------------------------------------------------------------------------------------------------  Chemistries  Recent Labs  Lab 02/10/17 0338  NA 125*  K 4.0  CL 96*  CO2 21*  GLUCOSE 100*  BUN 9  CREATININE 0.52  CALCIUM 8.5*  AST 22  ALT 15  ALKPHOS 74  BILITOT 0.4   ------------------------------------------------------------------------------------------------------------------  Cardiac Enzymes No results for input(s): TROPONINI in the last 168 hours. ------------------------------------------------------------------------------------------------------------------  RADIOLOGY:  Ct Hand Right W Contrast  Result Date: 02/08/2017 CLINICAL DATA:  Pt to ED reporting she woke up this morning and noted that her right thumb was stiff. Later she noted a bruised area and redness and swelling in right hand. pT now has a red streak running up right arm that reaches the upper right arm. EXAM: CT OF THE UPPER RIGHT EXTREMITY WITH CONTRAST TECHNIQUE: Multidetector CT imaging of the upper right extremity was performed according to the standard protocol following intravenous contrast administration. COMPARISON:  None. CONTRAST:  55mL ISOVUE-370 IOPAMIDOL (  ISOVUE-370) INJECTION 76% FINDINGS:  Bones/Joint/Cartilage No osseous fracture or dislocation seen. No destructive change or focal demineralization to suggest osteomyelitis. Ligaments Suboptimally assessed by CT. Muscles and Tendons No intramuscular fluid collection or abscess collection seen. No obvious tendon disruption. Soft tissues Edema within the subcutaneous soft tissues overlying the distal phalanx of the right thumb. Tiny hypodense focus that appears to be contiguous with the skin surface overlying the tuft of the distal phalanx may represent a small abscess, measuring approximately 4 mm greatest dimension (series 7, image 70; series 8, image 65). No soft tissue gas identified. IMPRESSION: 1. Ill-defined edema within the subcutaneous soft tissues overlying the distal phalanx of the right thumb. Tiny hypodense focus within the superficial soft tissues overlying the tuft of the distal phalanx is suspicious for small abscess, likely contiguous with the skin surface, measuring approximately 4 mm greatest dimension. 2. No soft tissue gas identified. 3. No evidence of osteomyelitis. 4. Consider MRI for more definitive characterization of the soft tissues of the right hand and arm. These results will be called to the ordering clinician or representative by the Radiologist Assistant, and communication documented in the PACS or zVision Dashboard. Electronically Signed   By: Franki Cabot M.D.   On: 02/08/2017 20:44   Dg Hand Complete Right  Result Date: 02/08/2017 CLINICAL DATA:  Cellulitis, swelling EXAM: RIGHT HAND - COMPLETE 3+ VIEW COMPARISON:  None. FINDINGS: Mild joint space narrowing in the IP joints. Early spurring. No acute bony abnormality. Specifically, no fracture, subluxation, or dislocation. Soft tissues unremarkable. IMPRESSION: No acute bony abnormality. Electronically Signed   By: Rolm Baptise M.D.   On: 02/08/2017 18:33    EKG:   Orders placed or performed during the hospital encounter of 09/29/14  . EKG 12-Lead  . EKG  12-Lead  . ED EKG  . ED EKG  . EKG    ASSESSMENT AND PLAN:   # Sepsis due to right hand and arm cellulitis. Sepsis protocol follow-up CBC and cultures revealing gram-positive cocci Blood cultures negative S/p incision and drainage of rt hand - by dr.Bowers postop day 1 Pain management prn ID is recommending to discontinue levoflox and vanc.  Patient is started on Ancef If clinically better can discharge in 1-2 days with p.o. Keflex 500 mg 4 times daily for 10 days and follow-up with surgery .  Hyponatremia.   NA is 125 Hydrate with IV fluids and check BMP in a.m.  COPD.  Stable, nebulizer as needed.  Hypertension.  Continue Norvasc.  Anxiety.  Continue home medication . Tobacco abuse.  Smoking cessation .  Nicotine patch   OT ewcommending HH OT     All the records are reviewed and case discussed with Care Management/Social Workerr. Management plans discussed with the patient, family and they are in agreement.  CODE STATUS: fc  TOTAL TIME TAKING CARE OF THIS PATIENT: 36 minutes.   POSSIBLE D/C IN 1-2 DAYS, DEPENDING ON CLINICAL CONDITION.  Note: This dictation was prepared with Dragon dictation along with smaller phrase technology. Any transcriptional errors that result from this process are unintentional.   Nicholes Mango M.D on 02/10/2017 at 2:56 PM  Between 7am to 6pm - Pager - [phone] After 6pm go to [email or web address] - password [email or web address] [facility]  [facility]  Office  [phone]  CC: Primary care physician; Roselee Nova, MD

## 2017-02-10 NOTE — Progress Notes (Signed)
Subjective:  Patient reports pain as moderate.  She has been using the right hand for activities.  Objective:   VITALS:   Vitals:   02/10/17 0119 02/10/17 0648 02/10/17 0823 02/10/17 1239  BP: 116/68 110/64 120/83 111/63  Pulse: 80 76 73 90  Resp: 16 16  16   Temp: 97.9 F (36.6 C) 97.7 F (36.5 C) 97.6 F (36.4 C) 97.7 F (36.5 C)  TempSrc: Oral Oral Oral Oral  SpO2: 96% 99% 99% 97%  Weight:      Height:        PHYSICAL EXAM:  Neurovascular intact Incision: scant drainage good capillary refill, erythema is no longer extending in to Axilla, however there erythema at the elbo  LABS  Results for orders placed or performed during the hospital encounter of 02/08/17 (from the past 24 hour(s))  Comprehensive metabolic panel     Status: Abnormal   Collection Time: 02/10/17  3:38 AM  Result Value Ref Range   Sodium 125 (L) 135 - 145 mmol/L   Potassium 4.0 3.5 - 5.1 mmol/L   Chloride 96 (L) 101 - 111 mmol/L   CO2 21 (L) 22 - 32 mmol/L   Glucose, Bld 100 (H) 65 - 99 mg/dL   BUN 9 6 - 20 mg/dL   Creatinine, Ser 0.52 0.44 - 1.00 mg/dL   Calcium 8.5 (L) 8.9 - 10.3 mg/dL   Total Protein 6.1 (L) 6.5 - 8.1 g/dL   Albumin 3.1 (L) 3.5 - 5.0 g/dL   AST 22 15 - 41 U/L   ALT 15 14 - 54 U/L   Alkaline Phosphatase 74 38 - 126 U/L   Total Bilirubin 0.4 0.3 - 1.2 mg/dL   GFR calc non Af Amer >60 >60 mL/min   GFR calc Af Amer >60 >60 mL/min   Anion gap 8 5 - 15  CBC     Status: Abnormal   Collection Time: 02/10/17  3:38 AM  Result Value Ref Range   WBC 19.4 (H) 3.6 - 11.0 K/uL   RBC 3.13 (L) 3.80 - 5.20 MIL/uL   Hemoglobin 9.8 (L) 12.0 - 16.0 g/dL   HCT 28.7 (L) 35.0 - 47.0 %   MCV 91.6 80.0 - 100.0 fL   MCH 31.2 26.0 - 34.0 pg   MCHC 34.1 32.0 - 36.0 g/dL   RDW 14.2 11.5 - 14.5 %   Platelets 275 150 - 440 K/uL    Ct Hand Right W Contrast  Result Date: 02/08/2017 CLINICAL DATA:  Pt to ED reporting she woke up this morning and noted that her right thumb was stiff. Later she  noted a bruised area and redness and swelling in right hand. pT now has a red streak running up right arm that reaches the upper right arm. EXAM: CT OF THE UPPER RIGHT EXTREMITY WITH CONTRAST TECHNIQUE: Multidetector CT imaging of the upper right extremity was performed according to the standard protocol following intravenous contrast administration. COMPARISON:  None. CONTRAST:  52mL ISOVUE-370 IOPAMIDOL (ISOVUE-370) INJECTION 76% FINDINGS: Bones/Joint/Cartilage No osseous fracture or dislocation seen. No destructive change or focal demineralization to suggest osteomyelitis. Ligaments Suboptimally assessed by CT. Muscles and Tendons No intramuscular fluid collection or abscess collection seen. No obvious tendon disruption. Soft tissues Edema within the subcutaneous soft tissues overlying the distal phalanx of the right thumb. Tiny hypodense focus that appears to be contiguous with the skin surface overlying the tuft of the distal phalanx may represent a small abscess, measuring approximately 4 mm greatest dimension (series  7, image 70; series 8, image 65). No soft tissue gas identified. IMPRESSION: 1. Ill-defined edema within the subcutaneous soft tissues overlying the distal phalanx of the right thumb. Tiny hypodense focus within the superficial soft tissues overlying the tuft of the distal phalanx is suspicious for small abscess, likely contiguous with the skin surface, measuring approximately 4 mm greatest dimension. 2. No soft tissue gas identified. 3. No evidence of osteomyelitis. 4. Consider MRI for more definitive characterization of the soft tissues of the right hand and arm. These results will be called to the ordering clinician or representative by the Radiologist Assistant, and communication documented in the PACS or zVision Dashboard. Electronically Signed   By: Franki Cabot M.D.   On: 02/08/2017 20:44   Dg Hand Complete Right  Result Date: 02/08/2017 CLINICAL DATA:  Cellulitis, swelling EXAM:  RIGHT HAND - COMPLETE 3+ VIEW COMPARISON:  None. FINDINGS: Mild joint space narrowing in the IP joints. Early spurring. No acute bony abnormality. Specifically, no fracture, subluxation, or dislocation. Soft tissues unremarkable. IMPRESSION: No acute bony abnormality. Electronically Signed   By: Rolm Baptise M.D.   On: 02/08/2017 18:33    Assessment/Plan: 1 Day Post-Op   Active Problems:   Sepsis ([place])   Continue broad ABX therapy waiting for culture results taken during irrigation. Her WBC is elevated, however her temperature has improved and slight improvement in extent of erythema. Will monitor closely. Waiting to see a decrease in the WBC, otherwise we may need to get an MRI of the right upper extremity to look for any further fluid collections. She currently clinically improving. Will follow closely. Please call with questions   Tamara Mcclure , MD 02/10/2017, 1:08 PM

## 2017-02-11 LAB — COMPREHENSIVE METABOLIC PANEL
ALBUMIN: 3.1 g/dL — AB (ref 3.5–5.0)
ALT: 18 U/L (ref 14–54)
ANION GAP: 5 (ref 5–15)
AST: 29 U/L (ref 15–41)
Alkaline Phosphatase: 83 U/L (ref 38–126)
BUN: <5 mg/dL — ABNORMAL LOW (ref 6–20)
CO2: 24 mmol/L (ref 22–32)
Calcium: 8.2 mg/dL — ABNORMAL LOW (ref 8.9–10.3)
Chloride: 103 mmol/L (ref 101–111)
Creatinine, Ser: 0.54 mg/dL (ref 0.44–1.00)
GFR calc Af Amer: >60 mL/min (ref >60)
GFR calc non Af Amer: >60 mL/min (ref >60)
GLUCOSE: 95 mg/dL (ref 65–99)
POTASSIUM: 4.1 mmol/L (ref 3.5–5.1)
SODIUM: 132 mmol/L — AB (ref 135–145)
Total Bilirubin: 0.5 mg/dL (ref 0.3–1.2)
Total Protein: 6.2 g/dL — ABNORMAL LOW (ref 6.5–8.1)

## 2017-02-11 LAB — CBC
HEMATOCRIT: 32.2 % — AB (ref 35.0–47.0)
HEMOGLOBIN: 11.1 g/dL — AB (ref 12.0–16.0)
MCH: 31.3 pg (ref 26.0–34.0)
MCHC: 34.5 g/dL (ref 32.0–36.0)
MCV: 90.6 fL (ref 80.0–100.0)
Platelets: 346 10*3/uL (ref 150–440)
RBC: 3.55 MIL/uL — ABNORMAL LOW (ref 3.80–5.20)
RDW: 14.1 % (ref 11.5–14.5)
WBC: 13 10*3/uL — ABNORMAL HIGH (ref 3.6–11.0)

## 2017-02-11 MED ORDER — CEPHALEXIN 500 MG PO CAPS: 500.0000 mg | ORAL_CAPSULE | Freq: Four times a day (QID) | ORAL | 0 refills | Status: AC

## 2017-02-11 MED ORDER — CLINDAMYCIN HCL 300 MG PO CAPS: 300.0000 mg | ORAL_CAPSULE | Freq: Four times a day (QID) | ORAL | 0 refills | Status: AC

## 2017-02-11 MED ORDER — OXYCODONE HCL 5 MG PO TABS: 5.0000 mg | ORAL_TABLET | Freq: Four times a day (QID) | ORAL | 0 refills | Status: DC | PRN

## 2017-02-11 NOTE — Discharge Instructions (Signed)

## 2017-02-11 NOTE — Progress Notes (Signed)
Patient is being discharged home with family. IV removed with cath intact. Splint to right arm. Patient to make follow-up appointment on Monday with Dr. Harlow Mares. Reviewed meds, scripts, and last dose taken. Allowed time for questions.

## 2017-02-11 NOTE — Progress Notes (Signed)
  Subjective:  Patient reports pain as moderate.    Objective:   VITALS:   Vitals:   02/10/17 1645 02/10/17 1935 02/11/17 0350 02/11/17 0830  BP: 119/74 98/63 109/60 (!) 124/52  Pulse: 78 85 82 68  Resp: 18 16 16 16   Temp: 98 F (36.7 C) 98.4 F (36.9 C) 99.3 F (37.4 C) 97.8 F (36.6 C)  TempSrc: Oral Oral Axillary Oral  SpO2: 100% 96% 95% 96%  Weight:      Height:        PHYSICAL EXAM:  Sensation intact distally Compartment soft  Decreased erythema in arm and forearm Good cap refill in all digits  LABS  Results for orders placed or performed during the hospital encounter of 02/08/17 (from the past 24 hour(s))  Comprehensive metabolic panel     Status: Abnormal   Collection Time: 02/11/17  3:54 AM  Result Value Ref Range   Sodium 132 (L) 135 - 145 mmol/L   Potassium 4.1 3.5 - 5.1 mmol/L   Chloride 103 101 - 111 mmol/L   CO2 24 22 - 32 mmol/L   Glucose, Bld 95 65 - 99 mg/dL   BUN <5 (L) 6 - 20 mg/dL   Creatinine, Ser 0.54 0.44 - 1.00 mg/dL   Calcium 8.2 (L) 8.9 - 10.3 mg/dL   Total Protein 6.2 (L) 6.5 - 8.1 g/dL   Albumin 3.1 (L) 3.5 - 5.0 g/dL   AST 29 15 - 41 U/L   ALT 18 14 - 54 U/L   Alkaline Phosphatase 83 38 - 126 U/L   Total Bilirubin 0.5 0.3 - 1.2 mg/dL   GFR calc non Af Amer >60 >60 mL/min   GFR calc Af Amer >60 >60 mL/min   Anion gap 5 5 - 15  CBC     Status: Abnormal   Collection Time: 02/11/17  3:54 AM  Result Value Ref Range   WBC 13.0 (H) 3.6 - 11.0 K/uL   RBC 3.55 (L) 3.80 - 5.20 MIL/uL   Hemoglobin 11.1 (L) 12.0 - 16.0 g/dL   HCT 32.2 (L) 35.0 - 47.0 %   MCV 90.6 80.0 - 100.0 fL   MCH 31.3 26.0 - 34.0 pg   MCHC 34.5 32.0 - 36.0 g/dL   RDW 14.1 11.5 - 14.5 %   Platelets 346 150 - 440 K/uL    No results found.  Assessment/Plan: 2 Days Post-Op   Active Problems:   Sepsis ([place])   Discharge home with home health OT Oral antibiotics for 10 days She will come in to my office on Tuesday 2/5 for a dressing change   Lovell Sheehan  , MD 02/11/2017, 10:52 AM

## 2017-02-11 NOTE — Discharge Summary (Signed)
[facility] NAME: Tamara Mcclure    MR#:  [id number]  DATE OF BIRTH:  04-Sep-1955  DATE OF ADMISSION:  02/08/2017 ADMITTING PHYSICIAN: Demetrios Loll, MD  DATE OF DISCHARGE: 02/11/2017  3:13 PM  PRIMARY CARE PHYSICIAN: Roselee Nova, MD    ADMISSION DIAGNOSIS:  Cellulitis of finger of right hand [L03.011] Sepsis, due to unspecified organism ([place]) [A41.9]  DISCHARGE DIAGNOSIS:  Active Problems:   Sepsis ([place])   SECONDARY DIAGNOSIS:   Past Medical History:  Diagnosis Date  . Anxiety   . Chronic lower back pain   . COPD (chronic obstructive pulmonary disease) ([place])   . Depression   . Hypertension   . Panic disorder with agoraphobia and severe panic attacks   . Peripheral sensory neuropathy     HOSPITAL COURSE:   1.  Clinical sepsis secondary to cellulitis and abscess.  Group A streptococcus growing out of cultures.  Patient had incision and drainage by Dr. Harlow Mares on 02/10/2017.  Clindamycin for a few days and Keflex for 10 more days.  Patient received IV antibiotics while here in the hospital. 2.  Hyponatremia this has improved with IV fluid hydration 3.  History of hypertension.  Blood pressure has been normal off the Norvasc. 4.  Anxiety depression on psychiatric medications 5.  History of COPD 6.  Tobacco abuse   DISCHARGE CONDITIONS:  Satisfactory Follow-up with Dr. Harlow Mares for dressing change  CONSULTS OBTAINED:  Treatment Team:  Lovell Sheehan, MD Leonel Ramsay, MD  DRUG ALLERGIES:   Allergies  Allergen Reactions  . Penicillins Rash    Has patient had a PCN reaction causing immediate rash, facial/tongue/throat swelling, SOB or lightheadedness with hypotension: Yes Has patient had a PCN reaction causing severe rash involving mucus membranes or skin necrosis: No Has patient had a PCN reaction that required hospitalization: No Has patient had a PCN reaction occurring within the last 10 years: No If all of  the above answers are "NO", then may proceed with Cephalosporin use.    DISCHARGE MEDICATIONS:   Allergies as of 02/11/2017      Reactions   Penicillins Rash   Has patient had a PCN reaction causing immediate rash, facial/tongue/throat swelling, SOB or lightheadedness with hypotension: Yes Has patient had a PCN reaction causing severe rash involving mucus membranes or skin necrosis: No Has patient had a PCN reaction that required hospitalization: No Has patient had a PCN reaction occurring within the last 10 years: No If all of the above answers are "NO", then may proceed with Cephalosporin use.      Medication List    STOP taking these medications   amLODipine 10 MG tablet Commonly known as:  NORVASC   Azelastine HCl 0.15 % Soln   HYDROcodone-acetaminophen 10-325 MG tablet Commonly known as:  NORCO     TAKE these medications   albuterol 108 (90 Base) MCG/ACT inhaler Commonly known as:  PROVENTIL HFA;VENTOLIN HFA Inhale 2 puffs into the lungs every 6 (six) hours as needed for wheezing or shortness of breath.   alprazolam 2 MG tablet Commonly known as:  XANAX Take 1 tablet (2 mg total) by mouth 3 (three) times daily as needed for anxiety.   carisoprodol 350 MG tablet Commonly known as:  SOMA Take 1 tablet (350 mg total) by mouth 3 (three) times daily.   cephALEXin 500 MG capsule Commonly known as:  KEFLEX Take 1 capsule (500 mg total) by mouth 4 (four)  times daily for 10 days.   clindamycin 300 MG capsule Commonly known as:  CLEOCIN Take 1 capsule (300 mg total) by mouth every 6 (six) hours for 2 days.   COMBIVENT RESPIMAT 20-100 MCG/ACT Aers respimat Generic drug:  Ipratropium-Albuterol INHALE 1 PUFF INTO THE LUNGS EVERY 6 HOURS AS NEEDED FOR WHEEZING OR SHORTNESS OF BREATH   fluticasone furoate-vilanterol 100-25 MCG/INH Aepb Commonly known as:  BREO ELLIPTA Inhale 1 puff into the lungs daily.   ibuprofen 200 MG tablet Commonly known as:  ADVIL,MOTRIN Take 200  mg by mouth every 6 (six) hours as needed for mild pain.   oxyCODONE 5 MG immediate release tablet Commonly known as:  Oxy IR/ROXICODONE Take 1 tablet (5 mg total) by mouth every 6 (six) hours as needed for moderate pain or severe pain.   potassium chloride 10 MEQ tablet Commonly known as:  K-DUR TAKE 1 TABLET BY MOUTH ONCE A DAY   pregabalin 75 MG capsule Commonly known as:  LYRICA Take 1 capsule (75 mg total) by mouth 3 (three) times daily.   QUEtiapine 100 MG tablet Commonly known as:  SEROQUEL Take 1 tablet (100 mg total) by mouth at bedtime.        DISCHARGE INSTRUCTIONS:    Follow-up with Dr. Harlow Mares orthopedics for dressing change next week  follow-up PMD 1 week  If you experience worsening of your admission symptoms, develop shortness of breath, life threatening emergency, suicidal or homicidal thoughts you must seek medical attention immediately by calling 911 or calling your MD immediately  if symptoms less severe.  You Must read complete instructions/literature along with all the possible adverse reactions/side effects for all the Medicines you take and that have been prescribed to you. Take any new Medicines after you have completely understood and accept all the possible adverse reactions/side effects.   Please note  You were cared for by a hospitalist during your hospital stay. If you have any questions about your discharge medications or the care you received while you were in the hospital after you are discharged, you can call the unit and asked to speak with the hospitalist on call if the hospitalist that took care of you is not available. Once you are discharged, your primary care physician will handle any further medical issues. Please note that NO REFILLS for any discharge medications will be authorized once you are discharged, as it is imperative that you return to your primary care physician (or establish a relationship with a primary care physician if you do  not have one) for your aftercare needs so that they can reassess your need for medications and monitor your lab values.    Today   CHIEF COMPLAINT:   Chief Complaint  Patient presents with  . Wound Infection    HISTORY OF PRESENT ILLNESS:  Tamara Mcclure  is a 62 y.o. female came in with an infection   VITAL SIGNS:  Blood pressure (!) 124/52, pulse 68, temperature 97.8 F (36.6 C), temperature source Oral, resp. rate 16, height 5\' 5"  (1.651 m), weight 63.7 kg (140 lb 8 oz), SpO2 96 %.    PHYSICAL EXAMINATION:  GENERAL:  62 y.o.-year-old patient lying in the bed with no acute distress.  EYES: Pupils equal, round, reactive to light and accommodation. No scleral icterus. Extraocular muscles intact.  HEENT: Head atraumatic, normocephalic. Oropharynx and nasopharynx clear.  NECK:  Supple, no jugular venous distention. No thyroid enlargement, no tenderness.  LUNGS: Normal breath sounds bilaterally, no wheezing, rales,rhonchi or  crepitation. No use of accessory muscles of respiration.  CARDIOVASCULAR: S1, S2 normal. No murmurs, rubs, or gallops.  ABDOMEN: Soft, non-tender, non-distended. Bowel sounds present. No organomegaly or mass.  EXTREMITIES: No pedal edema, cyanosis, or clubbing.  NEUROLOGIC: Cranial nerves II through XII are intact. Muscle strength 5/5 in all extremities. Sensation intact. Gait not checked.  PSYCHIATRIC: The patient is alert and oriented x 3.  SKIN: Large bandage over the right arm and hand  DATA REVIEW:   CBC Recent Labs  Lab 02/11/17 0354  WBC 13.0*  HGB 11.1*  HCT 32.2*  PLT 346    Chemistries  Recent Labs  Lab 02/11/17 0354  NA 132*  K 4.1  CL 103  CO2 24  GLUCOSE 95  BUN <5*  CREATININE 0.54  CALCIUM 8.2*  AST 29  ALT 18  ALKPHOS 83  BILITOT 0.5     Microbiology Results  Results for orders placed or performed during the hospital encounter of 02/08/17  Blood culture (routine x 2)     Status: None (Preliminary result)    Collection Time: 02/08/17  6:09 PM  Result Value Ref Range Status   Specimen Description BLOOD LEFT WRIST  Final   Special Requests   Final    BOTTLES DRAWN AEROBIC AND ANAEROBIC Blood Culture results may not be optimal due to an excessive volume of blood received in culture bottles   Culture   Final    NO GROWTH 3 DAYS Performed at [facility], [address], [place], [place] [address]    Report Status PENDING  Incomplete  Blood culture (routine x 2)     Status: None (Preliminary result)   Collection Time: 02/08/17  6:09 PM  Result Value Ref Range Status   Specimen Description BLOOD LFOA  Final   Special Requests   Final    BOTTLES DRAWN AEROBIC AND ANAEROBIC Blood Culture results may not be optimal due to an excessive volume of blood received in culture bottles   Culture   Final    NO GROWTH 3 DAYS Performed at [facility], [address] [place], [place] [address]    Report Status PENDING  Incomplete  Aerobic/Anaerobic Culture (surgical/deep wound)     Status: None (Preliminary result)   Collection Time: 02/09/17 10:12 AM  Result Value Ref Range Status   Specimen Description   Final    THUMB RIGHT Performed at [facility], [place] [address] [place], [place] [address]    Special Requests   Final    NONE Performed at [facility], [place], [place] [address]    Gram Stain   Final    RARE WBC PRESENT, PREDOMINANTLY PMN MODERATE GRAM POSITIVE COCCI Performed at [facility], [place] [address] [place], [place] [address]    Culture   Final    ABUNDANT GROUP A STREP (S.PYOGENES) ISOLATED NO ANAEROBES ISOLATED; CULTURE IN PROGRESS FOR 5 DAYS    Report Status PENDING  Incomplete      Management plans discussed with the patient, and she is in agreement.  CODE STATUS:     Code Status Orders  (From admission, onward)        Start     Ordered   02/08/17 2017  Full code  Continuous     02/08/17 2016    Code  Status History    Date Active Date Inactive Code Status Order ID Comments User Context   This patient has a  current code status but no historical code status.      TOTAL TIME TAKING CARE OF THIS PATIENT: 35 minutes.    Loletha Grayer M.D on 02/11/2017 at 3:41 PM  Between 7am to 6pm - Pager - [phone]  After 6pm go to [email or web address] - password [email or web address]  [facility] Office  [phone]  CC: Primary care physician; Roselee Nova, MD

## 2017-02-13 ENCOUNTER — Telehealth: Payer: Self-pay

## 2017-02-13 LAB — CULTURE, BLOOD (ROUTINE X 2)
Culture: NO GROWTH
Culture: NO GROWTH

## 2017-02-13 NOTE — Telephone Encounter (Signed)
I do not see any scheduled appointment with myself, if she wants to continue with Dr. Harlow Mares that's fine. Otherwise, she needs to come in to see me before I leave and then transition to Dr. Harlow Mares

## 2017-02-13 NOTE — Telephone Encounter (Signed)
Transition Care Management Follow-Up Telephone Call   Date discharged and where: 02/11/17 from [facility]  How have you been since you were released from the hospital? Still having slight discomfort at surgical site. States her pain is manageable with use of Oxy as prescribed.   Also states her drsg is dry and intact. Verbalized acceptance and understanding of her wound care instructions.  Confirmed she has picked up her Rx's from the pharmacy and is taking them as Rx'd. Denies any adverse reactions to the medications at this time.  Any patient concerns? Denies any questions or concerns at this time.  Items Reviewed:   Meds: Verified   Allergies: Verified   Dietary Changes Reviewed:  Functional Questionnaire:  Independent = I Dependent = D  ADLs: I   Dressing- I    Eating- I   Maintaining continence- I   Transferring- I   Transportation- I   Meal Prep- I   Managing Meds- I  Confirmed importance and Date/Time of follow-up visits scheduled: States she has scheduled her follow up appts with both Dr. Harlow Mares and her PCP. States care will transition to Dr. Mancel Bale after Dr. Trena Platt departure. Further states she would like to discuss with Dr. Harlow Mares about the necessity of scheduling an appt with ID.  Confirmed with patient if condition worsens to call PCP or go to the Emergency Dept. Patient was given office number and encouraged to call back with questions or concerns: Verbalized acceptance and understanding

## 2017-02-14 LAB — AEROBIC/ANAEROBIC CULTURE (SURGICAL/DEEP WOUND)

## 2017-02-14 LAB — AEROBIC/ANAEROBIC CULTURE W GRAM STAIN (SURGICAL/DEEP WOUND)

## 2017-02-15 ENCOUNTER — Other Ambulatory Visit: Payer: Self-pay | Admitting: Family Medicine

## 2017-02-15 DIAGNOSIS — J449 Chronic obstructive pulmonary disease, unspecified: Secondary | ICD-10-CM

## 2017-04-18 ENCOUNTER — Encounter: Payer: Self-pay | Admitting: Emergency Medicine

## 2017-04-18 ENCOUNTER — Other Ambulatory Visit: Payer: Self-pay

## 2017-04-18 ENCOUNTER — Emergency Department
Admission: EM | Admit: 2017-04-18 | Discharge: 2017-04-18 | Disposition: A | Discharge status: Home / Self Care | Payer: Medicare HMO | Attending: Emergency Medicine | Admitting: Emergency Medicine

## 2017-04-18 DIAGNOSIS — K1121 Acute sialoadenitis: Secondary | ICD-10-CM | POA: Diagnosis not present

## 2017-04-18 DIAGNOSIS — Z79899 Other long term (current) drug therapy: Secondary | ICD-10-CM | POA: Diagnosis not present

## 2017-04-18 DIAGNOSIS — I10 Essential (primary) hypertension: Secondary | ICD-10-CM | POA: Insufficient documentation

## 2017-04-18 DIAGNOSIS — K1120 Sialoadenitis, unspecified: Secondary | ICD-10-CM

## 2017-04-18 DIAGNOSIS — J029 Acute pharyngitis, unspecified: Secondary | ICD-10-CM | POA: Diagnosis present

## 2017-04-18 DIAGNOSIS — F17210 Nicotine dependence, cigarettes, uncomplicated: Secondary | ICD-10-CM | POA: Diagnosis not present

## 2017-04-18 DIAGNOSIS — J449 Chronic obstructive pulmonary disease, unspecified: Secondary | ICD-10-CM | POA: Insufficient documentation

## 2017-04-18 LAB — CBC WITH DIFFERENTIAL/PLATELET
BASOS ABS: 0.1 10*3/uL (ref 0–0.1)
BASOS PCT: 1 %
EOS ABS: 0.3 10*3/uL (ref 0–0.7)
Eosinophils Relative: 3 %
HEMATOCRIT: 36.6 % (ref 35.0–47.0)
Hemoglobin: 12.5 g/dL (ref 12.0–16.0)
Lymphocytes Relative: 33 %
Lymphs Abs: 3.2 10*3/uL (ref 1.0–3.6)
MCH: 31.3 pg (ref 26.0–34.0)
MCHC: 34.2 g/dL (ref 32.0–36.0)
MCV: 91.8 fL (ref 80.0–100.0)
MONO ABS: 0.9 10*3/uL (ref 0.2–0.9)
MONOS PCT: 9 %
NEUTROS ABS: 5.2 10*3/uL (ref 1.4–6.5)
NEUTROS PCT: 54 %
Platelets: 419 10*3/uL (ref 150–440)
RBC: 3.99 MIL/uL (ref 3.80–5.20)
RDW: 14.2 % (ref 11.5–14.5)
WBC: 9.8 10*3/uL (ref 3.6–11.0)

## 2017-04-18 LAB — COMPREHENSIVE METABOLIC PANEL
ALT: 13 U/L — ABNORMAL LOW (ref 14–54)
ANION GAP: 8 (ref 5–15)
AST: 21 U/L (ref 15–41)
Albumin: 4.3 g/dL (ref 3.5–5.0)
Alkaline Phosphatase: 109 U/L (ref 38–126)
BUN: 5 mg/dL — AB (ref 6–20)
CHLORIDE: 95 mmol/L — AB (ref 101–111)
CO2: 28 mmol/L (ref 22–32)
Calcium: 9 mg/dL (ref 8.9–10.3)
Creatinine, Ser: 0.45 mg/dL (ref 0.44–1.00)
GFR calc Af Amer: >60 mL/min (ref >60)
GFR calc non Af Amer: >60 mL/min (ref >60)
GLUCOSE: 87 mg/dL (ref 65–99)
POTASSIUM: 3.9 mmol/L (ref 3.5–5.1)
SODIUM: 131 mmol/L — AB (ref 135–145)
Total Bilirubin: 0.4 mg/dL (ref 0.3–1.2)
Total Protein: 8 g/dL (ref 6.5–8.1)

## 2017-04-18 LAB — GROUP A STREP BY PCR: Group A Strep by PCR: NOT DETECTED

## 2017-04-18 MED ORDER — CLINDAMYCIN HCL 300 MG PO CAPS: 300.0000 mg | ORAL_CAPSULE | Freq: Four times a day (QID) | ORAL | 0 refills | Status: DC

## 2017-04-18 MED ORDER — CLINDAMYCIN HCL 150 MG PO CAPS
300.0000 mg | ORAL_CAPSULE | Freq: Once | ORAL | Status: AC
  Administered 2017-04-18: 300 mg via ORAL
  Filled 2017-04-18: qty 2

## 2017-04-18 NOTE — ED Provider Notes (Signed)
[facility] Emergency Department Provider Note  ____________________________________________  Time seen: Approximately 7:47 PM  I have reviewed the triage vital signs and the nursing notes.   HISTORY  Chief Complaint Sore Throat and Oral Swelling    HPI Tamara Mcclure is a 62 y.o. female who presents the emergency department for swelling to the left side of her neck, sore throat.  Patient reports that she has experienced symptoms while eating and drinking over the past 3-4 days.  When eating or drinking, patient reports a sharp sensation from the left side of her throat into her left ear.  Patient denies any difficulty swallowing or breathing.  Patient has a significant chronic medical history but denies complaints with same.  No fevers or chills, nasal congestion, coughing, chest pain, shortness of breath, abdominal pain, nausea or vomiting, diarrhea or constipation.  Patient is not tried any medications for this complaint prior to arrival.  No other complaints at this time.  Past Medical History:  Diagnosis Date  . Anxiety   . Chronic lower back pain   . COPD (chronic obstructive pulmonary disease) ([place])   . Depression   . Hypertension   . Panic disorder with agoraphobia and severe panic attacks   . Peripheral sensory neuropathy     Patient Active Problem List   Diagnosis Date Noted  . Sepsis ([place]) 02/08/2017  . Chronic low back pain (Primary Area of Pain) (Bilateral) (R>L) 01/12/2017  . Chronic pain of lower extremity (Secondary Area of Pain) (B (R>L) 01/12/2017  . Chronic pain syndrome 01/12/2017  . Long term current use of opiate analgesic 01/12/2017  . Disorder of bone, unspecified 01/12/2017  . Other long term (current) drug therapy 01/12/2017  . Other specified health status 01/12/2017  . Chronic sacroiliac joint pain 01/12/2017  . Post-nasal drainage 10/31/2016  . Joint stiffness of hand 04/07/2016  . URI with cough and congestion 01/19/2016   . Discoloration of skin of finger 11/26/2015  . Need for home health care 07/20/2015  . Decreased vision 07/20/2015  . Acute sinusitis 07/16/2015  . Bilateral leg pain 06/18/2015  . Pain of left breast 05/21/2015  . Lesion of skin of face 12/25/2014  . Postprandial abdominal bloating 11/11/2014  . COPD (chronic obstructive pulmonary disease) ([place]) 11/03/2014  . Nasal sinus congestion 11/03/2014  . Hypertension 09/02/2014  . Dry skin 09/02/2014  . Menopausal symptoms 08/05/2014  . Peripheral sensory neuropathy 07/10/2014  . Anxiety and depression 07/10/2014  . Airway hyperreactivity 07/10/2014  . At risk for falling 07/10/2014  . Lumbosacral spondylosis 07/10/2014  . DDD (degenerative disc disease), lumbosacral 07/10/2014  . Discharge of ear 07/10/2014  . Degenerative arthritis of lumbar spine 07/10/2014  . Hyperlipidemia 07/10/2014  . Dysfunction of eustachian tube 07/10/2014  . ANA positive 07/10/2014  . Hypomagnesemia 07/10/2014  . Hypokalemia 07/10/2014  . Cerumen impaction 07/10/2014  . Cramps of lower extremity 07/10/2014  . Leg swelling 07/10/2014  . Spasm 07/10/2014  . Compulsive tobacco user syndrome 07/10/2014  . Atrophy of vagina 07/10/2014  . Chronic radicular low back pain 06/11/2014  . Insomnia 06/11/2014  . Panic disorder with agoraphobia and severe panic attacks 06/11/2014  . Foot pain 12/17/2013  . Burning sensation of feet 12/17/2013  . Numbness and tingling 12/17/2013  . CAFL (chronic airflow limitation) ([place]) 06/21/2011  . Clinical depression 06/21/2011    Past Surgical History:  Procedure Laterality Date  . I&D EXTREMITY Right 02/09/2017   Procedure: IRRIGATION AND DEBRIDEMENT EXTREMITY;  Surgeon:  Lovell Sheehan, MD;  Location: [facility] ORS;  Service: Orthopedics;  Laterality: Right;  . none      Prior to Admission medications   Medication Sig Start Date End Date Taking? Authorizing Provider  albuterol (PROVENTIL HFA;VENTOLIN HFA) 108 (90 Base)  MCG/ACT inhaler Inhale 2 puffs into the lungs every 6 (six) hours as needed for wheezing or shortness of breath. 11/16/15   Roselee Nova, MD  alprazolam Duanne Moron) 2 MG tablet Take 1 tablet (2 mg total) by mouth 3 (three) times daily as needed for anxiety. 01/12/17   Roselee Nova, MD  carisoprodol (SOMA) 350 MG tablet Take 1 tablet (350 mg total) by mouth 3 (three) times daily. 01/12/17   Roselee Nova, MD  clindamycin (CLEOCIN) 300 MG capsule Take 1 capsule (300 mg total) by mouth 4 (four) times daily. 04/18/17   Cuthriell, Roderic Palau D, PA-C  COMBIVENT RESPIMAT 20-100 MCG/ACT AERS respimat INHALE 1 PUFF INTO THE LUNGS EVERY 6 HOURS AS NEEDED FOR WHEEZING OR SHORTNESS OF BREATH 02/15/17   Rochel Brome A, MD  fluticasone furoate-vilanterol (BREO ELLIPTA) 100-25 MCG/INH AEPB Inhale 1 puff into the lungs daily. 11/26/15   Roselee Nova, MD  ibuprofen (ADVIL,MOTRIN) 200 MG tablet Take 200 mg by mouth every 6 (six) hours as needed for mild pain.     [provider]  oxyCODONE (OXY IR/ROXICODONE) 5 MG immediate release tablet Take 1 tablet (5 mg total) by mouth every 6 (six) hours as needed for moderate pain or severe pain. 02/11/17   Loletha Grayer, MD  potassium chloride (K-DUR) 10 MEQ tablet TAKE 1 TABLET BY MOUTH ONCE A DAY 07/16/15   Roselee Nova, MD  pregabalin (LYRICA) 75 MG capsule Take 1 capsule (75 mg total) by mouth 3 (three) times daily. 01/12/17   Roselee Nova, MD  QUEtiapine (SEROQUEL) 100 MG tablet Take 1 tablet (100 mg total) by mouth at bedtime. 12/13/16   Roselee Nova, MD    Allergies Penicillins  Family History  Problem Relation Age of Onset  . Cancer Mother        Lung  . Cancer Father        pancreatic  . Breast cancer Neg Hx   . Ovarian cancer Neg Hx   . Colon cancer Neg Hx   . Diabetes Neg Hx   . Heart disease Neg Hx     Social History Social History   Tobacco Use  . Smoking status: Current Every Day Smoker    Packs/day: 1.00    Years:  30.00    Pack years: 30.00    Types: Cigarettes  . Smokeless tobacco: Never Used  Substance Use Topics  . Alcohol use: No  . Drug use: No     Review of Systems  Constitutional: No fever/chills Eyes: No visual changes. No discharge ENT: Left-sided sore throat and swelling to the left external throat. Cardiovascular: no chest pain. Respiratory: no cough. No SOB. Gastrointestinal: No abdominal pain.  No nausea, no vomiting.  No diarrhea.  No constipation. Musculoskeletal: Negative for musculoskeletal pain. Skin: Negative for rash, abrasions, lacerations, ecchymosis. Neurological: Negative for headaches, focal weakness or numbness. 10-point ROS otherwise negative.  ____________________________________________   PHYSICAL EXAM:  VITAL SIGNS: ED Triage Vitals  Enc Vitals Group     BP 04/18/17 1914 138/80     Pulse Rate 04/18/17 1914 73     Resp 04/18/17 1914 18     Temp  04/18/17 1914 98 F (36.7 C)     Temp Source 04/18/17 1914 Oral     SpO2 04/18/17 1914 94 %     Weight 04/18/17 1915 132 lb (59.9 kg)     Height 04/18/17 1915 5\' 5"  (1.651 m)     Head Circumference --      Peak Flow --      Pain Score 04/18/17 1915 0     Pain Loc --      Pain Edu? --      Excl. in [place]? --      Constitutional: Alert and oriented. Well appearing and in no acute distress. Eyes: Conjunctivae are normal. PERRL. EOMI. Head: Atraumatic. ENT:      Ears: EACs unremarkable bilaterally.  TM on right is unremarkable.  TM on left has ruptured.  This is chronic.      Nose: No congestion/rhinnorhea.      Mouth/Throat: Mucous membranes are moist.  Oropharynx is nonerythematous and nonedematous.  Tonsils are unremarkable bilaterally.  Uvula is midline.  No angioedema.  No oropharyngeal edema. Neck: No stridor.  Neck is supple full range of motion.  Shot with minimal visible swelling in the left submandibular region.  Palpable lesion noted in the left submandibular region.  No other visible swelling or  palpable lesions. Hematological/Lymphatic/Immunilogical: No cervical lymphadenopathy. Cardiovascular: Normal rate, regular rhythm. Normal S1 and S2.  Good peripheral circulation. Respiratory: Normal respiratory effort without tachypnea or retractions. Lungs CTAB. Good air entry to the bases with no decreased or absent breath sounds. Musculoskeletal: Full range of motion to all extremities. No gross deformities appreciated. Neurologic:  Normal speech and language. No gross focal neurologic deficits are appreciated.  Skin:  Skin is warm, dry and intact. No rash noted. Psychiatric: Mood and affect are normal. Speech and behavior are normal. Patient exhibits appropriate insight and judgement.   ____________________________________________   LABS (all labs ordered are listed, but only abnormal results are displayed)  Labs Reviewed  COMPREHENSIVE METABOLIC PANEL - Abnormal; Notable for the following components:      Result Value   Sodium 131 (*)    Chloride 95 (*)    BUN 5 (*)    ALT 13 (*)    All other components within normal limits  GROUP A STREP BY PCR  CBC WITH DIFFERENTIAL/PLATELET   ____________________________________________  EKG   ____________________________________________  RADIOLOGY   No results found.  ____________________________________________    PROCEDURES  Procedure(s) performed:    Procedures    Medications  clindamycin (CLEOCIN) capsule 300 mg (300 mg Oral Given 04/18/17 2104)     ____________________________________________   INITIAL IMPRESSION / ASSESSMENT AND PLAN / ED COURSE  Pertinent labs & imaging results that were available during my care of the patient were reviewed by me and considered in my medical decision making (see chart for details).  Review of the [place] CSRS was performed in accordance of the [place] prior to dispensing any controlled drugs.     Patient's diagnosis is consistent with sialoadenitis.  Patient presented with  sharp left-sided mouth/throat pain with eating.  On exam, patient had findings consistent with sialoadenitis.  Differential included sialoadenitis, parotiditis, peritonsillar abscess, strep.  While exam is most consistent with sialoadenitis, I recommended imaging with CT scan to further evaluate the area.  Patient adamantly declines at this time.  Patient signed refusal of procedure form prior to discharge.  Patient is given strong recommendations to follow-up for any worsening of her condition, not improvement with prescribed  medicines, or for any other concern.  Patient is also encouraged to use sour candy in addition to the antibiotics for symptom improvement.. Patient will be discharged home with prescriptions for clindamycin. Patient is to follow up with primary care as needed or otherwise directed. Patient is given ED precautions to return to the ED for any worsening or new symptoms.     ____________________________________________  FINAL CLINICAL IMPRESSION(S) / ED DIAGNOSES  Final diagnoses:  Sialoadenitis      NEW MEDICATIONS STARTED DURING THIS VISIT:  ED Discharge Orders        Ordered    clindamycin (CLEOCIN) 300 MG capsule  4 times daily     04/18/17 2055          This chart was dictated using voice recognition software/Dragon. Despite best efforts to proofread, errors can occur which can change the meaning. Any change was purely unintentional.    Darletta Moll, PA-C 04/18/17 2136    Arta Silence, MD 04/18/17 2227

## 2017-04-18 NOTE — ED Triage Notes (Signed)
Pt arrived to the ED via EMS from home for complaints of left throat swelling and pain. Pt states that this afternoon she began to experience throat pain and neck swelling making it difficult to swallow. Pt reports that she does not have an ear drum on the left ear and that might be causing all the symptoms. Pt is AOx4 in no apparent distress with mild swelling on the left side of the neck.

## 2017-04-18 NOTE — ED Notes (Signed)
First Nurse Note:  Patient is complaining of left jaw pain.  Patient presents to the ED via EMS from home.  Patient ambulatory with steady gait.  No obvious distress.

## 2017-04-18 NOTE — ED Notes (Signed)
Pt states was eating italian ice and suddenly felt a sharp pain from lower left jaw/neck radiating to ear. Denies pain in teeth. Left neck swollen and tender to touch

## 2017-04-18 NOTE — ED Notes (Signed)

## 2017-04-18 NOTE — ED Notes (Signed)
Treatment refusal signed by patient. Placed in records

## 2017-06-27 IMAGING — CR DG CHEST 2V
2 series · 2 of 2 positions shown · non-contrast
Comparison: 04/12/2014

CLINICAL DATA: Cough and wheezing for 1 week. Shortness of breath.
COPD.

EXAM:
CHEST  2 VIEW

[chest pa]
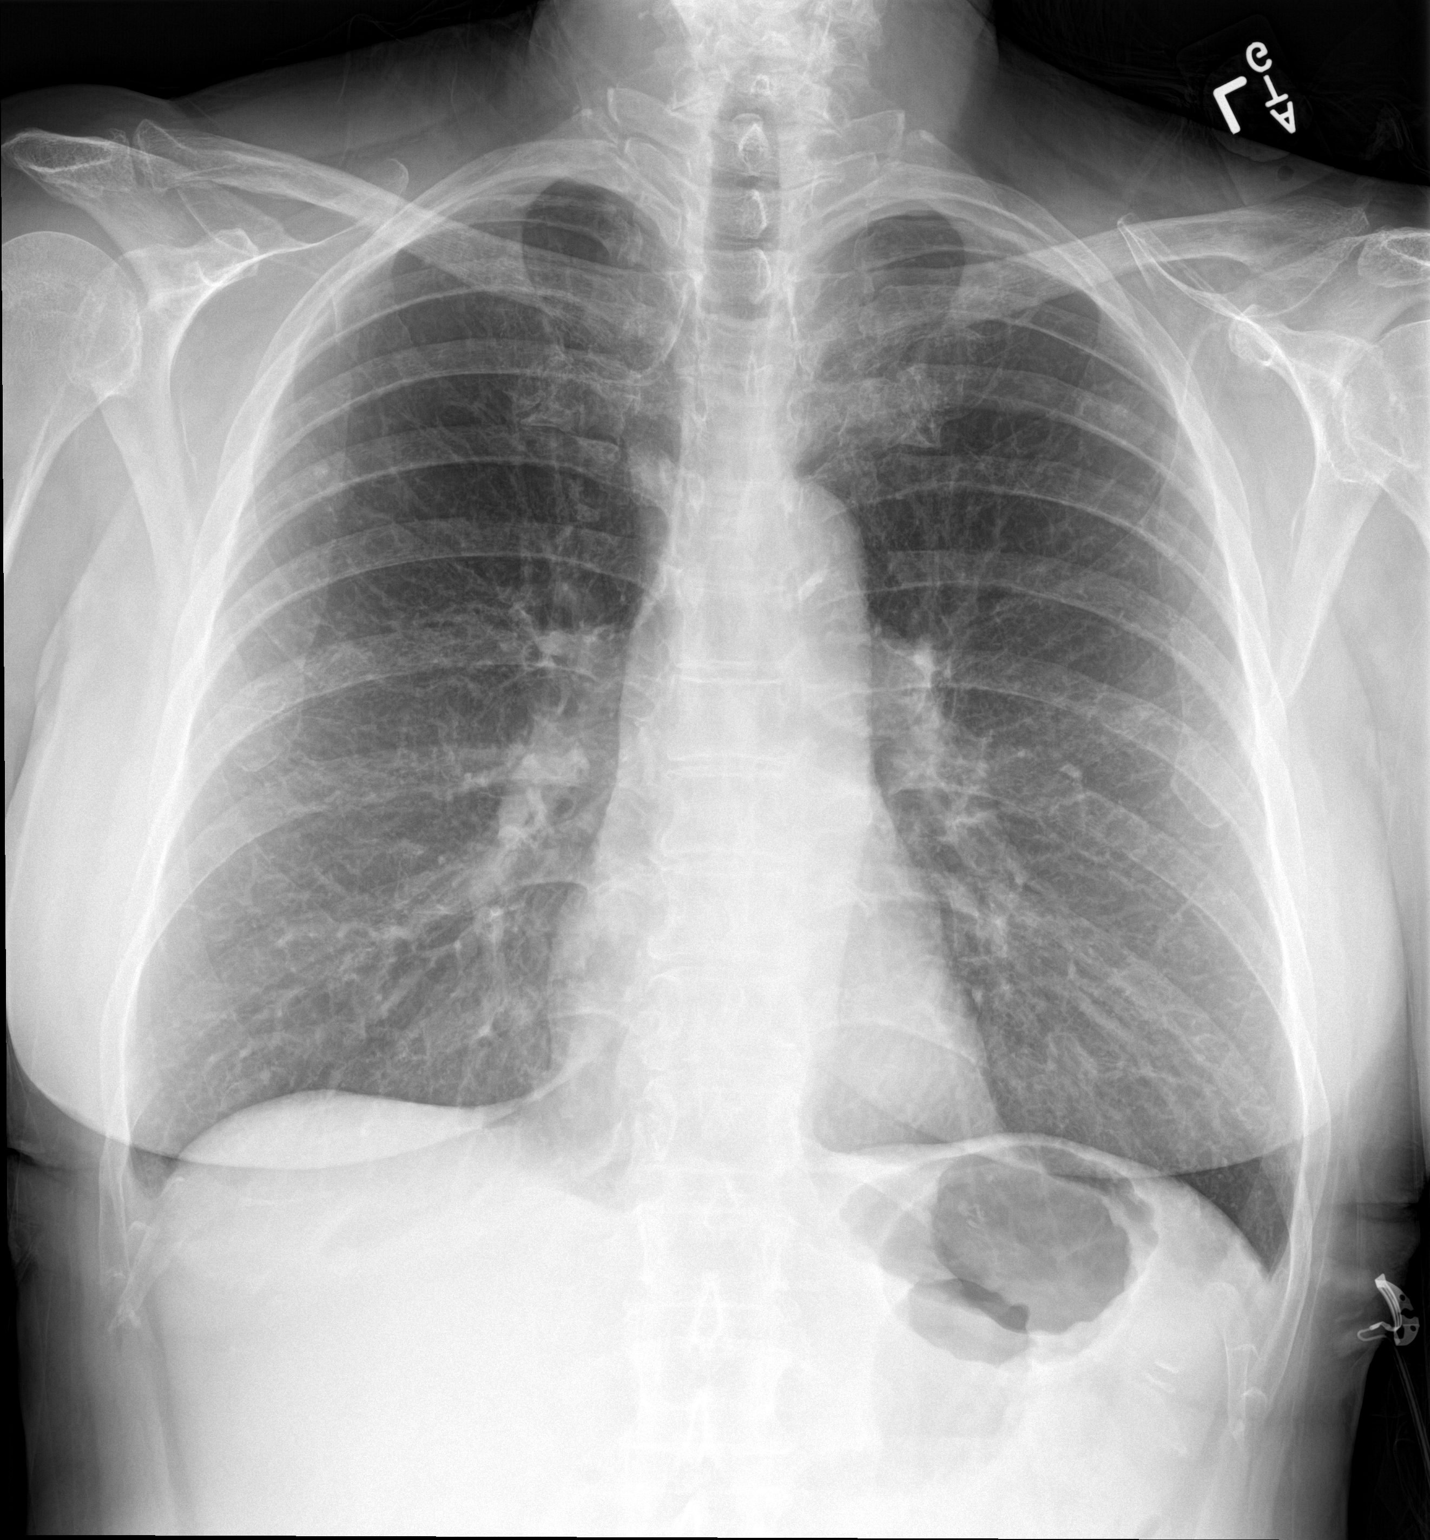

[chest lat]
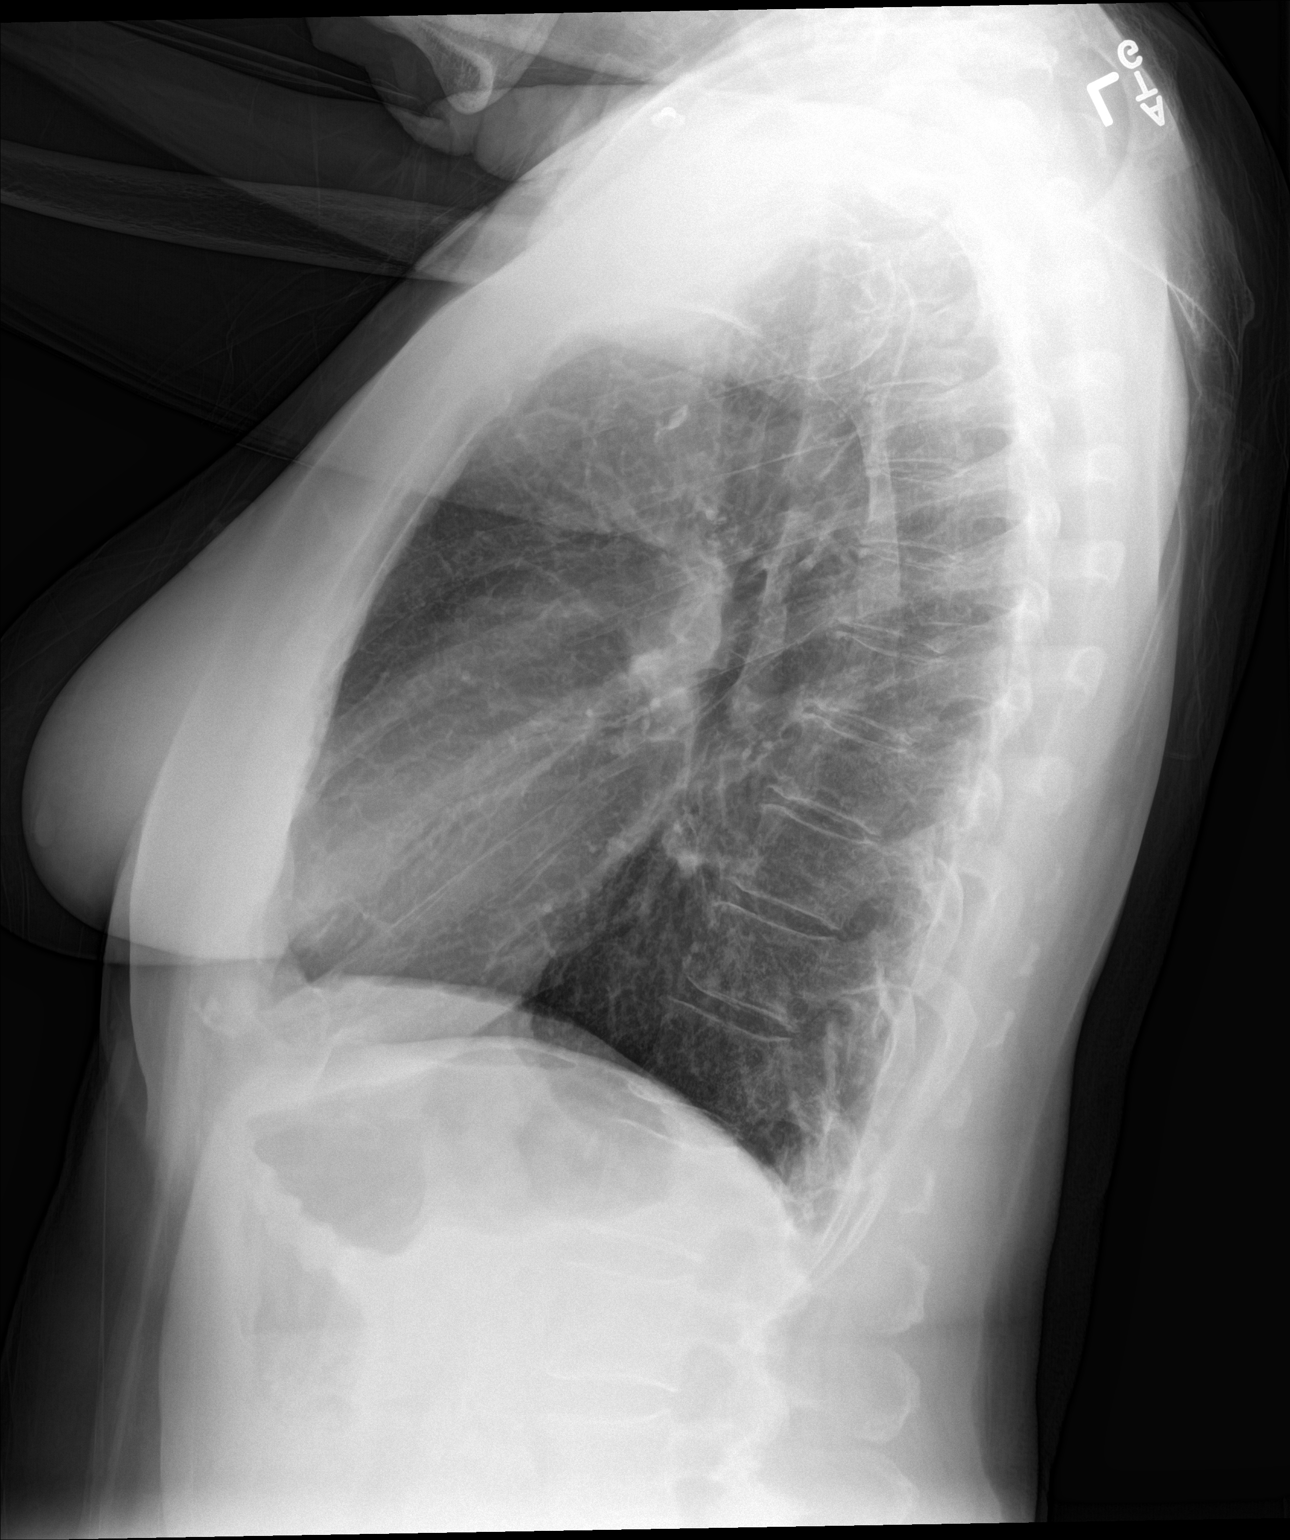

[2 of 2 positions shown; findings below may reference images not displayed]

FINDINGS: The heart size and mediastinal contours are within normal limits.
Both lungs are clear. The visualized skeletal structures are
unremarkable.
IMPRESSION: No active cardiopulmonary disease.

## 2020-07-02 ENCOUNTER — Emergency Department: Payer: Medicare Other

## 2020-07-02 ENCOUNTER — Inpatient Hospital Stay (HOSPITAL_COMMUNITY)
Admit: 2020-07-02 | Discharge: 2020-07-02 | Disposition: A | Discharge status: Home / Self Care | Payer: Medicare Other | Attending: Pulmonary Disease | Admitting: Pulmonary Disease

## 2020-07-02 ENCOUNTER — Other Ambulatory Visit: Payer: Self-pay

## 2020-07-02 ENCOUNTER — Inpatient Hospital Stay
Admission: EM | Admit: 2020-07-02 | Discharge: 2020-08-10 | DRG: 870 | Disposition: E | Payer: Medicare Other | Attending: Internal Medicine | Admitting: Internal Medicine

## 2020-07-02 ENCOUNTER — Inpatient Hospital Stay: Payer: Medicare Other

## 2020-07-02 DIAGNOSIS — J15212 Pneumonia due to Methicillin resistant Staphylococcus aureus: Secondary | ICD-10-CM

## 2020-07-02 DIAGNOSIS — J151 Pneumonia due to Pseudomonas: Secondary | ICD-10-CM | POA: Diagnosis present

## 2020-07-02 DIAGNOSIS — R296 Repeated falls: Secondary | ICD-10-CM | POA: Diagnosis present

## 2020-07-02 DIAGNOSIS — F32A Depression, unspecified: Secondary | ICD-10-CM | POA: Diagnosis present

## 2020-07-02 DIAGNOSIS — D72823 Leukemoid reaction: Secondary | ICD-10-CM | POA: Diagnosis present

## 2020-07-02 DIAGNOSIS — E871 Hypo-osmolality and hyponatremia: Secondary | ICD-10-CM | POA: Diagnosis present

## 2020-07-02 DIAGNOSIS — J90 Pleural effusion, not elsewhere classified: Secondary | ICD-10-CM | POA: Diagnosis not present

## 2020-07-02 DIAGNOSIS — R0600 Dyspnea, unspecified: Secondary | ICD-10-CM

## 2020-07-02 DIAGNOSIS — E876 Hypokalemia: Secondary | ICD-10-CM | POA: Diagnosis not present

## 2020-07-02 DIAGNOSIS — E43 Unspecified severe protein-calorie malnutrition: Secondary | ICD-10-CM | POA: Diagnosis not present

## 2020-07-02 DIAGNOSIS — R0902 Hypoxemia: Secondary | ICD-10-CM

## 2020-07-02 DIAGNOSIS — G629 Polyneuropathy, unspecified: Secondary | ICD-10-CM | POA: Diagnosis present

## 2020-07-02 DIAGNOSIS — R571 Hypovolemic shock: Secondary | ICD-10-CM | POA: Diagnosis present

## 2020-07-02 DIAGNOSIS — J9601 Acute respiratory failure with hypoxia: Secondary | ICD-10-CM

## 2020-07-02 DIAGNOSIS — I10 Essential (primary) hypertension: Secondary | ICD-10-CM | POA: Diagnosis present

## 2020-07-02 DIAGNOSIS — G9340 Encephalopathy, unspecified: Secondary | ICD-10-CM

## 2020-07-02 DIAGNOSIS — Z66 Do not resuscitate: Secondary | ICD-10-CM | POA: Diagnosis not present

## 2020-07-02 DIAGNOSIS — Z801 Family history of malignant neoplasm of trachea, bronchus and lung: Secondary | ICD-10-CM

## 2020-07-02 DIAGNOSIS — R6521 Severe sepsis with septic shock: Secondary | ICD-10-CM | POA: Diagnosis present

## 2020-07-02 DIAGNOSIS — A4152 Sepsis due to Pseudomonas: Principal | ICD-10-CM | POA: Diagnosis present

## 2020-07-02 DIAGNOSIS — Z20822 Contact with and (suspected) exposure to covid-19: Secondary | ICD-10-CM | POA: Diagnosis present

## 2020-07-02 DIAGNOSIS — J9621 Acute and chronic respiratory failure with hypoxia: Secondary | ICD-10-CM | POA: Diagnosis present

## 2020-07-02 DIAGNOSIS — F13239 Sedative, hypnotic or anxiolytic dependence with withdrawal, unspecified: Secondary | ICD-10-CM | POA: Diagnosis not present

## 2020-07-02 DIAGNOSIS — Z978 Presence of other specified devices: Secondary | ICD-10-CM | POA: Diagnosis not present

## 2020-07-02 DIAGNOSIS — D72829 Elevated white blood cell count, unspecified: Secondary | ICD-10-CM

## 2020-07-02 DIAGNOSIS — Z7951 Long term (current) use of inhaled steroids: Secondary | ICD-10-CM

## 2020-07-02 DIAGNOSIS — Z7189 Other specified counseling: Secondary | ICD-10-CM | POA: Diagnosis not present

## 2020-07-02 DIAGNOSIS — M545 Low back pain, unspecified: Secondary | ICD-10-CM | POA: Diagnosis present

## 2020-07-02 DIAGNOSIS — Z9911 Dependence on respirator [ventilator] status: Secondary | ICD-10-CM

## 2020-07-02 DIAGNOSIS — E870 Hyperosmolality and hypernatremia: Secondary | ICD-10-CM | POA: Diagnosis not present

## 2020-07-02 DIAGNOSIS — Z880 Allergy status to penicillin: Secondary | ICD-10-CM

## 2020-07-02 DIAGNOSIS — Z79899 Other long term (current) drug therapy: Secondary | ICD-10-CM

## 2020-07-02 DIAGNOSIS — E873 Alkalosis: Secondary | ICD-10-CM | POA: Diagnosis present

## 2020-07-02 DIAGNOSIS — J9622 Acute and chronic respiratory failure with hypercapnia: Secondary | ICD-10-CM | POA: Diagnosis present

## 2020-07-02 DIAGNOSIS — R410 Disorientation, unspecified: Secondary | ICD-10-CM | POA: Diagnosis not present

## 2020-07-02 DIAGNOSIS — Z79891 Long term (current) use of opiate analgesic: Secondary | ICD-10-CM

## 2020-07-02 DIAGNOSIS — J918 Pleural effusion in other conditions classified elsewhere: Secondary | ICD-10-CM | POA: Diagnosis present

## 2020-07-02 DIAGNOSIS — J189 Pneumonia, unspecified organism: Secondary | ICD-10-CM | POA: Diagnosis not present

## 2020-07-02 DIAGNOSIS — Z4682 Encounter for fitting and adjustment of non-vascular catheter: Secondary | ICD-10-CM

## 2020-07-02 DIAGNOSIS — F17210 Nicotine dependence, cigarettes, uncomplicated: Secondary | ICD-10-CM | POA: Diagnosis present

## 2020-07-02 DIAGNOSIS — G928 Other toxic encephalopathy: Secondary | ICD-10-CM | POA: Diagnosis present

## 2020-07-02 DIAGNOSIS — Z515 Encounter for palliative care: Secondary | ICD-10-CM | POA: Diagnosis not present

## 2020-07-02 DIAGNOSIS — A4102 Sepsis due to Methicillin resistant Staphylococcus aureus: Secondary | ICD-10-CM | POA: Diagnosis present

## 2020-07-02 DIAGNOSIS — Z0189 Encounter for other specified special examinations: Secondary | ICD-10-CM

## 2020-07-02 DIAGNOSIS — D649 Anemia, unspecified: Secondary | ICD-10-CM

## 2020-07-02 DIAGNOSIS — N179 Acute kidney failure, unspecified: Secondary | ICD-10-CM

## 2020-07-02 DIAGNOSIS — Z800 Family history of malignant neoplasm of digestive organs: Secondary | ICD-10-CM

## 2020-07-02 DIAGNOSIS — A419 Sepsis, unspecified organism: Secondary | ICD-10-CM | POA: Diagnosis not present

## 2020-07-02 DIAGNOSIS — E875 Hyperkalemia: Secondary | ICD-10-CM

## 2020-07-02 DIAGNOSIS — Z4659 Encounter for fitting and adjustment of other gastrointestinal appliance and device: Secondary | ICD-10-CM

## 2020-07-02 DIAGNOSIS — R0603 Acute respiratory distress: Secondary | ICD-10-CM

## 2020-07-02 DIAGNOSIS — J432 Centrilobular emphysema: Secondary | ICD-10-CM | POA: Diagnosis present

## 2020-07-02 DIAGNOSIS — J9600 Acute respiratory failure, unspecified whether with hypoxia or hypercapnia: Secondary | ICD-10-CM

## 2020-07-02 DIAGNOSIS — J9602 Acute respiratory failure with hypercapnia: Secondary | ICD-10-CM | POA: Diagnosis not present

## 2020-07-02 DIAGNOSIS — G8929 Other chronic pain: Secondary | ICD-10-CM | POA: Diagnosis present

## 2020-07-02 DIAGNOSIS — R1310 Dysphagia, unspecified: Secondary | ICD-10-CM | POA: Diagnosis present

## 2020-07-02 DIAGNOSIS — F4001 Agoraphobia with panic disorder: Secondary | ICD-10-CM | POA: Diagnosis present

## 2020-07-02 DIAGNOSIS — J9690 Respiratory failure, unspecified, unspecified whether with hypoxia or hypercapnia: Secondary | ICD-10-CM

## 2020-07-02 DIAGNOSIS — Z6821 Body mass index (BMI) 21.0-21.9, adult: Secondary | ICD-10-CM

## 2020-07-02 DIAGNOSIS — G9341 Metabolic encephalopathy: Secondary | ICD-10-CM

## 2020-07-02 DIAGNOSIS — R52 Pain, unspecified: Secondary | ICD-10-CM

## 2020-07-02 LAB — CBC WITH DIFFERENTIAL/PLATELET
Abs Immature Granulocytes: 0.19 10*3/uL — ABNORMAL HIGH (ref 0.00–0.07)
Basophils Absolute: 0 10*3/uL (ref 0.0–0.1)
Basophils Relative: 0 %
Eosinophils Absolute: 0.2 10*3/uL (ref 0.0–0.5)
Eosinophils Relative: 1 %
HCT: 29.2 % — ABNORMAL LOW (ref 36.0–46.0)
Hemoglobin: 10.6 g/dL — ABNORMAL LOW (ref 12.0–15.0)
Immature Granulocytes: 1 %
Lymphocytes Relative: 12 %
Lymphs Abs: 2.4 10*3/uL (ref 0.7–4.0)
MCH: 31.1 pg (ref 26.0–34.0)
MCHC: 36.3 g/dL — ABNORMAL HIGH (ref 30.0–36.0)
MCV: 85.6 fL (ref 80.0–100.0)
Monocytes Absolute: 2.8 10*3/uL — ABNORMAL HIGH (ref 0.1–1.0)
Monocytes Relative: 14 %
Neutro Abs: 15.1 10*3/uL — ABNORMAL HIGH (ref 1.7–7.7)
Neutrophils Relative %: 72 %
Platelets: 296 10*3/uL (ref 150–400)
RBC: 3.41 MIL/uL — ABNORMAL LOW (ref 3.87–5.11)
RDW: 12.4 % (ref 11.5–15.5)
WBC: 20.7 10*3/uL — ABNORMAL HIGH (ref 4.0–10.5)
nRBC: 0 % (ref 0.0–0.2)

## 2020-07-02 LAB — BLOOD GAS, ARTERIAL
Acid-base deficit: 5.3 mmol/L — ABNORMAL HIGH (ref 0.0–2.0)
Bicarbonate: 21.6 mmol/L (ref 20.0–28.0)
FIO2: 0.4
MECHVT: 450 mL
O2 Saturation: 94.8 %
PEEP: 5 cmH2O
Patient temperature: 37
RATE: 16 resp/min
pCO2 arterial: 47 mmHg (ref 32.0–48.0)
pH, Arterial: 7.27 — ABNORMAL LOW (ref 7.350–7.450)
pO2, Arterial: 85 mmHg (ref 83.0–108.0)

## 2020-07-02 LAB — GLUCOSE, CAPILLARY
Glucose-Capillary: 154 mg/dL — ABNORMAL HIGH (ref 70–99)
Glucose-Capillary: 189 mg/dL — ABNORMAL HIGH (ref 70–99)
Glucose-Capillary: 174 mg/dL — ABNORMAL HIGH (ref 70–99)
Glucose-Capillary: 167 mg/dL — ABNORMAL HIGH (ref 70–99)

## 2020-07-02 LAB — URINALYSIS, ROUTINE W REFLEX MICROSCOPIC
Bacteria, UA: NONE SEEN
Bilirubin Urine: NEGATIVE
Glucose, UA: 150 mg/dL — AB
Ketones, ur: NEGATIVE mg/dL
Leukocytes,Ua: NEGATIVE
Nitrite: NEGATIVE
Protein, ur: NEGATIVE mg/dL
Specific Gravity, Urine: 1.008 (ref 1.005–1.030)
Squamous Epithelial / HPF: NONE SEEN (ref 0–5)
pH: 6 (ref 5.0–8.0)

## 2020-07-02 LAB — ECHOCARDIOGRAM COMPLETE
AR max vel: 2.84 cm2
AV Area VTI: 2.74 cm2
AV Area mean vel: 2.8 cm2
AV Mean grad: 4 mmHg
AV Peak grad: 6.7 mmHg
Ao pk vel: 1.3 m/s
Area-P 1/2: 2.62 cm2
Height: 65 in
S' Lateral: 1.61 cm
Weight: 2472.68 oz

## 2020-07-02 LAB — BLOOD GAS, VENOUS
Acid-base deficit: 6.4 mmol/L — ABNORMAL HIGH (ref 0.0–2.0)
Bicarbonate: 21.9 mmol/L (ref 20.0–28.0)
O2 Saturation: 97.9 %
Patient temperature: 37
pCO2, Ven: 56 mmHg (ref 44.0–60.0)
pH, Ven: 7.2 — ABNORMAL LOW (ref 7.250–7.430)
pO2, Ven: 123 mmHg — ABNORMAL HIGH (ref 32.0–45.0)

## 2020-07-02 LAB — LACTIC ACID, PLASMA
Lactic Acid, Venous: 0.6 mmol/L (ref 0.5–1.9)
Lactic Acid, Venous: 1.1 mmol/L (ref 0.5–1.9)

## 2020-07-02 LAB — RESP PANEL BY RT-PCR (FLU A&B, COVID) ARPGX2
Influenza A by PCR: NEGATIVE
Influenza B by PCR: NEGATIVE
SARS Coronavirus 2 by RT PCR: NEGATIVE

## 2020-07-02 LAB — URINE DRUG SCREEN, QUALITATIVE (ARMC ONLY)
Amphetamines, Ur Screen: NOT DETECTED
Barbiturates, Ur Screen: NOT DETECTED
Benzodiazepine, Ur Scrn: POSITIVE — AB
Cannabinoid 50 Ng, Ur ~~LOC~~: NOT DETECTED
Cocaine Metabolite,Ur ~~LOC~~: NOT DETECTED
MDMA (Ecstasy)Ur Screen: NOT DETECTED
Methadone Scn, Ur: NOT DETECTED
Opiate, Ur Screen: POSITIVE — AB
Phencyclidine (PCP) Ur S: NOT DETECTED
Tricyclic, Ur Screen: POSITIVE — AB

## 2020-07-02 LAB — COMPREHENSIVE METABOLIC PANEL
ALT: 29 U/L (ref 0–44)
AST: 39 U/L (ref 15–41)
Albumin: 3.3 g/dL — ABNORMAL LOW (ref 3.5–5.0)
Alkaline Phosphatase: 226 U/L — ABNORMAL HIGH (ref 38–126)
Anion gap: 10 (ref 5–15)
BUN: 13 mg/dL (ref 8–23)
CO2: 22 mmol/L (ref 22–32)
Calcium: 8.3 mg/dL — ABNORMAL LOW (ref 8.9–10.3)
Chloride: 76 mmol/L — ABNORMAL LOW (ref 98–111)
Creatinine, Ser: 0.97 mg/dL (ref 0.44–1.00)
GFR, Estimated: >60 mL/min (ref >60)
Glucose, Bld: 103 mg/dL — ABNORMAL HIGH (ref 70–99)
Potassium: 4.1 mmol/L (ref 3.5–5.1)
Sodium: 108 mmol/L — CL (ref 135–145)
Total Bilirubin: 0.9 mg/dL (ref 0.3–1.2)
Total Protein: 7 g/dL (ref 6.5–8.1)

## 2020-07-02 LAB — OSMOLALITY, URINE
Osmolality, Ur: 189 mOsm/kg — ABNORMAL LOW (ref 300–900)
Osmolality, Ur: 97 mOsm/kg — ABNORMAL LOW (ref 300–900)

## 2020-07-02 LAB — URINALYSIS, COMPLETE (UACMP) WITH MICROSCOPIC
Bacteria, UA: NONE SEEN
Bilirubin Urine: NEGATIVE
Glucose, UA: NEGATIVE mg/dL
Hgb urine dipstick: NEGATIVE
Ketones, ur: NEGATIVE mg/dL
Leukocytes,Ua: NEGATIVE
Nitrite: NEGATIVE
Protein, ur: NEGATIVE mg/dL
Specific Gravity, Urine: 1.01 (ref 1.005–1.030)
pH: 6.5 (ref 5.0–8.0)

## 2020-07-02 LAB — SODIUM, URINE, RANDOM: Sodium, Ur: <10 mmol/L

## 2020-07-02 LAB — PROTIME-INR
INR: 1.7 — ABNORMAL HIGH (ref 0.8–1.2)
Prothrombin Time: 20 seconds — ABNORMAL HIGH (ref 11.4–15.2)

## 2020-07-02 LAB — BASIC METABOLIC PANEL
Anion gap: 7 (ref 5–15)
BUN: 12 mg/dL (ref 8–23)
CO2: 23 mmol/L (ref 22–32)
Calcium: 8.3 mg/dL — ABNORMAL LOW (ref 8.9–10.3)
Chloride: 89 mmol/L — ABNORMAL LOW (ref 98–111)
Creatinine, Ser: 0.72 mg/dL (ref 0.44–1.00)
GFR, Estimated: >60 mL/min (ref >60)
Glucose, Bld: 154 mg/dL — ABNORMAL HIGH (ref 70–99)
Potassium: 4.3 mmol/L (ref 3.5–5.1)
Sodium: 119 mmol/L — CL (ref 135–145)

## 2020-07-02 LAB — TROPONIN I (HIGH SENSITIVITY)
Troponin I (High Sensitivity): 14 ng/L (ref <18)
Troponin I (High Sensitivity): 14 ng/L (ref <18)

## 2020-07-02 LAB — LACTATE DEHYDROGENASE, PLEURAL OR PERITONEAL FLUID: LD, Fluid: 979 U/L — ABNORMAL HIGH (ref 3–23)

## 2020-07-02 LAB — PROTEIN, TOTAL: Total Protein: 6.3 g/dL — ABNORMAL LOW (ref 6.5–8.1)

## 2020-07-02 LAB — HIV ANTIBODY (ROUTINE TESTING W REFLEX): HIV Screen 4th Generation wRfx: NONREACTIVE

## 2020-07-02 LAB — PROCALCITONIN: Procalcitonin: 0.1 ng/mL

## 2020-07-02 LAB — CREATININE, URINE, RANDOM: Creatinine, Urine: 57 mg/dL

## 2020-07-02 LAB — LACTATE DEHYDROGENASE: LDH: 112 U/L (ref 98–192)

## 2020-07-02 LAB — BRAIN NATRIURETIC PEPTIDE: B Natriuretic Peptide: 127.8 pg/mL — ABNORMAL HIGH (ref 0.0–100.0)

## 2020-07-02 LAB — MRSA PCR SCREENING: MRSA by PCR: POSITIVE — AB

## 2020-07-02 MED ORDER — IPRATROPIUM-ALBUTEROL 0.5-2.5 (3) MG/3ML IN SOLN
3.0000 mL | Freq: Once | RESPIRATORY_TRACT | Status: AC
  Administered 2020-07-02: 3 mL via RESPIRATORY_TRACT

## 2020-07-02 MED ORDER — HEPARIN SODIUM (PORCINE) 5000 UNIT/ML IJ SOLN
5000.0000 [IU] | Freq: Three times a day (TID) | INTRAMUSCULAR | Status: DC
  Administered 2020-07-02 – 2020-07-20 (×52): 5000 [IU] via SUBCUTANEOUS
  Filled 2020-07-02 (×53): qty 1

## 2020-07-02 MED ORDER — PERFLUTREN LIPID MICROSPHERE
1.0000 mL | INTRAVENOUS | Status: AC | PRN
  Administered 2020-07-02: 2 mL via INTRAVENOUS
  Filled 2020-07-02: qty 10

## 2020-07-02 MED ORDER — FENTANYL BOLUS VIA INFUSION
50.0000 ug | INTRAVENOUS | Status: DC | PRN
  Administered 2020-07-02 – 2020-07-04 (×2): 50 ug via INTRAVENOUS
  Filled 2020-07-02: qty 50

## 2020-07-02 MED ORDER — SODIUM CHLORIDE 0.9 % IV BOLUS
1000.0000 mL | Freq: Once | INTRAVENOUS | Status: AC
  Administered 2020-07-02: 1000 mL via INTRAVENOUS

## 2020-07-02 MED ORDER — PROPOFOL 1000 MG/100ML IV EMUL: 0.0000 ug/kg/min | INTRAVENOUS | Status: DC

## 2020-07-02 MED ORDER — IPRATROPIUM-ALBUTEROL 0.5-2.5 (3) MG/3ML IN SOLN
3.0000 mL | Freq: Once | RESPIRATORY_TRACT | Status: AC
  Administered 2020-07-02: 3 mL via RESPIRATORY_TRACT
  Filled 2020-07-02: qty 6

## 2020-07-02 MED ORDER — IPRATROPIUM-ALBUTEROL 0.5-2.5 (3) MG/3ML IN SOLN
3.0000 mL | Freq: Four times a day (QID) | RESPIRATORY_TRACT | Status: DC
  Administered 2020-07-02 – 2020-07-09 (×27): 3 mL via RESPIRATORY_TRACT
  Filled 2020-07-02 (×26): qty 3

## 2020-07-02 MED ORDER — IPRATROPIUM-ALBUTEROL 0.5-2.5 (3) MG/3ML IN SOLN: 3.0000 mL | Freq: Four times a day (QID) | RESPIRATORY_TRACT | Status: DC

## 2020-07-02 MED ORDER — CHLORHEXIDINE GLUCONATE CLOTH 2 % EX PADS
6.0000 | MEDICATED_PAD | Freq: Every day | CUTANEOUS | Status: DC
  Administered 2020-07-02 – 2020-07-20 (×18): 6 via TOPICAL

## 2020-07-02 MED ORDER — SODIUM CHLORIDE 0.9 % IV SOLN
500.0000 mg | INTRAVENOUS | Status: DC
  Administered 2020-07-02: 500 mg via INTRAVENOUS
  Filled 2020-07-02 (×3): qty 500

## 2020-07-02 MED ORDER — VANCOMYCIN HCL 1500 MG/300ML IV SOLN
1500.0000 mg | Freq: Once | INTRAVENOUS | Status: AC
  Administered 2020-07-02: 1500 mg via INTRAVENOUS
  Filled 2020-07-02: qty 300

## 2020-07-02 MED ORDER — PROPOFOL 1000 MG/100ML IV EMUL
INTRAVENOUS | Status: AC
  Administered 2020-07-02: 10 ug/kg/min via INTRAVENOUS
  Filled 2020-07-02: qty 100

## 2020-07-02 MED ORDER — SODIUM CHLORIDE 0.9 % IV SOLN
250.0000 mL | INTRAVENOUS | Status: DC
  Administered 2020-07-03: 250 mL via INTRAVENOUS

## 2020-07-02 MED ORDER — METRONIDAZOLE 500 MG/100ML IV SOLN
500.0000 mg | Freq: Three times a day (TID) | INTRAVENOUS | Status: DC
  Administered 2020-07-02: 500 mg via INTRAVENOUS
  Filled 2020-07-02 (×3): qty 100

## 2020-07-02 MED ORDER — SUCCINYLCHOLINE CHLORIDE 20 MG/ML IJ SOLN
90.0000 mg | Freq: Once | INTRAMUSCULAR | Status: AC
  Administered 2020-07-02: 90 mg via INTRAVENOUS

## 2020-07-02 MED ORDER — NOREPINEPHRINE 4 MG/250ML-% IV SOLN
2.0000 ug/min | INTRAVENOUS | Status: DC
  Administered 2020-07-02: 8 ug/min via INTRAVENOUS
  Administered 2020-07-02: 5 ug/min via INTRAVENOUS
  Filled 2020-07-02 (×2): qty 250

## 2020-07-02 MED ORDER — ETOMIDATE 2 MG/ML IV SOLN
18.0000 mg | Freq: Once | INTRAVENOUS | Status: AC
  Administered 2020-07-02: 18 mg via INTRAVENOUS

## 2020-07-02 MED ORDER — NOREPINEPHRINE 16 MG/250ML-% IV SOLN
0.0000 ug/min | INTRAVENOUS | Status: DC
  Administered 2020-07-03: 2 ug/min via INTRAVENOUS
  Administered 2020-07-05: 7 ug/min via INTRAVENOUS
  Filled 2020-07-02 (×3): qty 250

## 2020-07-02 MED ORDER — SODIUM CHLORIDE 0.9 % IV SOLN
2.0000 g | Freq: Once | INTRAVENOUS | Status: AC
  Administered 2020-07-02: 2 g via INTRAVENOUS
  Filled 2020-07-02: qty 2

## 2020-07-02 MED ORDER — METHYLPREDNISOLONE SODIUM SUCC 40 MG IJ SOLR
40.0000 mg | Freq: Four times a day (QID) | INTRAMUSCULAR | Status: DC
  Administered 2020-07-02 – 2020-07-06 (×16): 40 mg via INTRAVENOUS
  Filled 2020-07-02 (×16): qty 1

## 2020-07-02 MED ORDER — MUPIROCIN 2 % EX OINT
1.0000 "application " | TOPICAL_OINTMENT | Freq: Two times a day (BID) | CUTANEOUS | Status: AC
  Administered 2020-07-02 – 2020-07-07 (×10): 1 via NASAL
  Filled 2020-07-02: qty 22

## 2020-07-02 MED ORDER — VANCOMYCIN HCL 1250 MG/250ML IV SOLN
1250.0000 mg | INTRAVENOUS | Status: DC
  Filled 2020-07-02: qty 250

## 2020-07-02 MED ORDER — FUROSEMIDE 10 MG/ML IJ SOLN
40.0000 mg | Freq: Two times a day (BID) | INTRAMUSCULAR | Status: DC
  Administered 2020-07-02: 40 mg via INTRAVENOUS
  Filled 2020-07-02: qty 4

## 2020-07-02 MED ORDER — METRONIDAZOLE 500 MG/100ML IV SOLN
500.0000 mg | Freq: Three times a day (TID) | INTRAVENOUS | Status: DC
  Administered 2020-07-02 – 2020-07-06 (×11): 500 mg via INTRAVENOUS
  Filled 2020-07-02 (×13): qty 100

## 2020-07-02 MED ORDER — PANTOPRAZOLE SODIUM 40 MG IV SOLR
40.0000 mg | Freq: Every day | INTRAVENOUS | Status: DC
  Administered 2020-07-02 – 2020-07-19 (×18): 40 mg via INTRAVENOUS
  Filled 2020-07-02 (×18): qty 40

## 2020-07-02 MED ORDER — FENTANYL 2500MCG IN NS 250ML (10MCG/ML) PREMIX INFUSION
INTRAVENOUS | Status: AC
  Administered 2020-07-02: 50 ug/h via INTRAVENOUS
  Filled 2020-07-02: qty 250

## 2020-07-02 MED ORDER — SODIUM CHLORIDE 0.9 % IV SOLN
250.0000 mL | INTRAVENOUS | Status: DC
  Administered 2020-07-02: 250 mL via INTRAVENOUS

## 2020-07-02 MED ORDER — IOHEXOL 300 MG/ML  SOLN
100.0000 mL | Freq: Once | INTRAMUSCULAR | Status: AC | PRN
  Administered 2020-07-02: 100 mL via INTRAVENOUS

## 2020-07-02 MED ORDER — FENTANYL 2500MCG IN NS 250ML (10MCG/ML) PREMIX INFUSION
25.0000 ug/h | INTRAVENOUS | Status: DC
  Administered 2020-07-03: 200 ug/h via INTRAVENOUS
  Administered 2020-07-03 (×2): 250 ug/h via INTRAVENOUS
  Administered 2020-07-04: 18:00:00 300 ug/h via INTRAVENOUS
  Administered 2020-07-04: 10:00:00 250 ug/h via INTRAVENOUS
  Administered 2020-07-05 (×3): 300 ug/h via INTRAVENOUS
  Administered 2020-07-06: 04:00:00 350 ug/h via INTRAVENOUS
  Filled 2020-07-02 (×9): qty 250

## 2020-07-02 MED ORDER — POLYETHYLENE GLYCOL 3350 17 G PO PACK: 17.0000 g | PACK | Freq: Every day | ORAL | Status: DC | PRN

## 2020-07-02 MED ORDER — CHLORHEXIDINE GLUCONATE CLOTH 2 % EX PADS: 6.0000 | MEDICATED_PAD | Freq: Every day | CUTANEOUS | Status: DC

## 2020-07-02 MED ORDER — PROPOFOL 1000 MG/100ML IV EMUL
0.0000 ug/kg/min | INTRAVENOUS | Status: DC
  Administered 2020-07-02: 50 ug/kg/min via INTRAVENOUS
  Administered 2020-07-02: 40 ug/kg/min via INTRAVENOUS
  Administered 2020-07-02: 35 ug/kg/min via INTRAVENOUS
  Administered 2020-07-03 (×3): 50 ug/kg/min via INTRAVENOUS
  Administered 2020-07-03: 35 ug/kg/min via INTRAVENOUS
  Administered 2020-07-04 (×6): 50 ug/kg/min via INTRAVENOUS
  Administered 2020-07-05: 40 ug/kg/min via INTRAVENOUS
  Administered 2020-07-05 (×2): 50 ug/kg/min via INTRAVENOUS
  Administered 2020-07-05: 30 ug/kg/min via INTRAVENOUS
  Administered 2020-07-05 – 2020-07-06 (×3): 50 ug/kg/min via INTRAVENOUS
  Filled 2020-07-02 (×21): qty 100

## 2020-07-02 MED ORDER — INSULIN ASPART 100 UNIT/ML IJ SOLN
0.0000 [IU] | INTRAMUSCULAR | Status: DC
  Administered 2020-07-02 – 2020-07-03 (×6): 3 [IU] via SUBCUTANEOUS
  Administered 2020-07-03 (×2): 2 [IU] via SUBCUTANEOUS
  Administered 2020-07-03 (×2): 3 [IU] via SUBCUTANEOUS
  Administered 2020-07-04: 2 [IU] via SUBCUTANEOUS
  Administered 2020-07-04 (×2): 3 [IU] via SUBCUTANEOUS
  Administered 2020-07-04: 2 [IU] via SUBCUTANEOUS
  Administered 2020-07-04: 3 [IU] via SUBCUTANEOUS
  Administered 2020-07-05 – 2020-07-06 (×7): 2 [IU] via SUBCUTANEOUS
  Filled 2020-07-02 (×22): qty 1

## 2020-07-02 MED ORDER — DOCUSATE SODIUM 100 MG PO CAPS: 100.0000 mg | ORAL_CAPSULE | Freq: Two times a day (BID) | ORAL | Status: DC | PRN

## 2020-07-02 MED ORDER — FENTANYL CITRATE (PF) 100 MCG/2ML IJ SOLN
50.0000 ug | Freq: Once | INTRAMUSCULAR | Status: AC
  Administered 2020-07-02: 50 ug via INTRAVENOUS

## 2020-07-02 MED ORDER — SODIUM CHLORIDE 0.9 % IV SOLN
2.0000 g | Freq: Three times a day (TID) | INTRAVENOUS | Status: DC
  Administered 2020-07-02 – 2020-07-03 (×2): 2 g via INTRAVENOUS
  Filled 2020-07-02 (×4): qty 2

## 2020-07-02 MED ORDER — MIDAZOLAM HCL 2 MG/2ML IJ SOLN
4.0000 mg | Freq: Once | INTRAMUSCULAR | Status: AC
  Administered 2020-07-02: 4 mg via INTRAVENOUS
  Filled 2020-07-02: qty 4

## 2020-07-02 MED ORDER — ETOMIDATE 2 MG/ML IV SOLN: 15.0000 mg | Freq: Once | INTRAVENOUS | Status: DC

## 2020-07-02 NOTE — Consult Note (Signed)
PHARMACY CONSULT NOTE - FOLLOW UP  Pharmacy Consult for Electrolyte Monitoring and Replacement   Recent Labs: Potassium (mmol/L)  Date Value  07/05/2020 4.1  04/12/2014 3.4 (L)   Magnesium (mg/dL)  Date Value  01/12/2017 1.8  04/12/2014 1.4 (L)   Calcium (mg/dL)  Date Value  06/24/2020 8.3 (L)   Calcium, Total (mg/dL)  Date Value  04/12/2014 7.8 (L)   Albumin (g/dL)  Date Value  06/29/2020 3.3 (L)  01/12/2017 4.8  04/12/2014 3.0 (L)   Sodium (mmol/L)  Date Value  06/25/2020 108 (LL)  01/12/2017 130 (L)  04/12/2014 136   Corrected Ca 8.86  Assessment: 65 year old with history of COPD, HTN, presenting with AMS and acute on chronic respiratory failure.    Recent increase doses of opiate following fall -  failed Narcan and BiPAP in ED now intubated and transferring to CCU.  Pt severe hyponatremic with Na 108 - Urine and Serum Osmolarity pending.  Pharmacy consulted to monitor and replenish electrolytes per E-link protocol.  Goal of Therapy:  Electrolytes wnl  Plan:  No Ca or K replenishment warranted at this time - will follow hyponatremia labs for plan.  Mg/Phos with am labs  Lu Duffel ,PharmD Clinical Pharmacist 06/13/2020 12:16 PM

## 2020-07-02 NOTE — Progress Notes (Signed)
Pt was transported to CCU from the ED while on the vent.

## 2020-07-02 NOTE — Procedures (Signed)
Insertion of Chest Tube Procedure Note  Tamara Mcclure  [id number]  1955/11/08  Date:06/30/2020  Time:5:12 PM    Provider Performing: Marshell Garfinkel   Procedure: Pleural Catheter Insertion w/ Imaging Guidance [phone])  Indication(s) Effusion  Consent Risks of the procedure as well as the alternatives and risks of each were explained to the patient and/or caregiver.  Consent for the procedure was obtained and is signed in the bedside chart  Anesthesia Topical only with 1% lidocaine    Time Out Verified patient identification, verified procedure, site/side was marked, verified correct patient position, special equipment/implants available, medications/allergies/relevant history reviewed, required imaging and test results available.   Sterile Technique Maximal sterile technique including full sterile barrier drape, hand hygiene, sterile gown, sterile gloves, mask, hair covering, sterile ultrasound probe cover (if used).   Procedure Description Ultrasound used to identify appropriate pleural anatomy for placement and overlying skin marked. Area of placement cleaned and draped in sterile fashion.  A pigtail pleural catheter was placed into the right pleural space using Seldinger technique. Appropriate return of fluid was obtained. 600 cc of  straw colored fluid obtained. The tube was connected to atrium and placed on -20 cm H2O wall suction.   Complications/Tolerance None; patient tolerated the procedure well. Chest X-ray is ordered to verify placement.   EBL Minimal  Specimen(s) none

## 2020-07-02 NOTE — ED Provider Notes (Signed)
[facility] Emergency Department Provider Note  ____________________________________________   Event Date/Time   First MD Initiated Contact with Patient 07/06/2020 [id number]     (approximate)  I have reviewed the triage vital signs and the nursing notes.   HISTORY  Chief Complaint Respiratory Distress    HPI Tamara Mcclure is a 65 y.o. female with past medical history of COPD, hypertension, reported opiate abuse, here with shortness of breath.  History initially provided by EMS.  Per report, they were called out to the scene for altered mental status and hypoxia.  Patient was satting in the 70s on nonrebreather on their arrival.  She was placed on BiPAP with improvement to the low 80s.  She was also very drowsy, and was given Narcan with improvement in her mental status.  She has been tachypneic and complaining of shortness of breath.  She has complained of discomfort with the BiPAP mask.  On my assessment, her main complaint is the mask and she repeats to take it off.  She is in obvious respite distress and unable to provide further history.  Upon further discussion with the son after initial stabilization, the patient reportedly fell several days ago.  She was seen at a local urgent care and has been taking increased doses of opiate pain medications.  She has a long history of opiate misuse, and son is concerned that she may be taking more than she is prescribed.  When she called this morning, she sounded slurred, as she does when she has previously misused.     Past Medical History:  Diagnosis Date   Anxiety    Chronic lower back pain    COPD (chronic obstructive pulmonary disease) ([place])    Depression    Hypertension    Panic disorder with agoraphobia and severe panic attacks    Peripheral sensory neuropathy     Patient Active Problem List   Diagnosis Date Noted   Acute respiratory failure ([place]) 06/30/2020   Sepsis ([place]) 02/08/2017   Chronic low back  pain (Primary Area of Pain) (Bilateral) (R>L) 01/12/2017   Chronic pain of lower extremity (Secondary Area of Pain) (B (R>L) 01/12/2017   Chronic pain syndrome 01/12/2017   Long term current use of opiate analgesic 01/12/2017   Disorder of bone, unspecified 01/12/2017   Other long term (current) drug therapy 01/12/2017   Other specified health status 01/12/2017   Chronic sacroiliac joint pain 01/12/2017   Post-nasal drainage 10/31/2016   Joint stiffness of hand 04/07/2016   URI with cough and congestion 01/19/2016   Discoloration of skin of finger 11/26/2015   Need for home health care 07/20/2015   Decreased vision 07/20/2015   Acute sinusitis 07/16/2015   Bilateral leg pain 06/18/2015   Pain of left breast 05/21/2015   Lesion of skin of face 12/25/2014   Postprandial abdominal bloating 11/11/2014   COPD (chronic obstructive pulmonary disease) ([place]) 11/03/2014   Nasal sinus congestion 11/03/2014   Hypertension 09/02/2014   Dry skin 09/02/2014   Menopausal symptoms 08/05/2014   Peripheral sensory neuropathy 07/10/2014   Anxiety and depression 07/10/2014   Airway hyperreactivity 07/10/2014   At risk for falling 07/10/2014   Lumbosacral spondylosis 07/10/2014   DDD (degenerative disc disease), lumbosacral 07/10/2014   Discharge of ear 07/10/2014   Degenerative arthritis of lumbar spine 07/10/2014   Hyperlipidemia 07/10/2014   Dysfunction of eustachian tube 07/10/2014   ANA positive 07/10/2014   Hypomagnesemia 07/10/2014   Hypokalemia 07/10/2014   Cerumen impaction  07/10/2014   Cramps of lower extremity 07/10/2014   Leg swelling 07/10/2014   Spasm 07/10/2014   Compulsive tobacco user syndrome 07/10/2014   Atrophy of vagina 07/10/2014   Chronic radicular low back pain 06/11/2014   Insomnia 06/11/2014   Panic disorder with agoraphobia and severe panic attacks 06/11/2014   Foot pain 12/17/2013   Burning sensation of feet 12/17/2013   Numbness and tingling 12/17/2013   CAFL  (chronic airflow limitation) ([place]) 06/21/2011   Clinical depression 06/21/2011    Past Surgical History:  Procedure Laterality Date   I & D EXTREMITY Right 02/09/2017   Procedure: IRRIGATION AND DEBRIDEMENT EXTREMITY;  Surgeon: Lovell Sheehan, MD;  Location: [facility] ORS;  Service: Orthopedics;  Laterality: Right;   none      Prior to Admission medications   Medication Sig Start Date End Date Taking? Authorizing Provider  alprazolam Duanne Moron) 2 MG tablet Take 1 tablet (2 mg total) by mouth 3 (three) times daily as needed for anxiety. 01/12/17  Yes Keith Rake Asad A, MD  COMBIVENT RESPIMAT 20-100 MCG/ACT AERS respimat INHALE 1 PUFF INTO THE LUNGS EVERY 6 HOURS AS NEEDED FOR WHEEZING OR SHORTNESS OF BREATH Patient taking differently: Inhale 1 puff into the lungs every 6 (six) hours as needed for wheezing or shortness of breath. 02/15/17  Yes Roselee Nova, MD  hydrochlorothiazide (MICROZIDE) 12.5 MG capsule Take 12.5 mg by mouth daily.   Yes [provider]  HYDROcodone-acetaminophen (NORCO) 10-325 MG tablet Take 1 tablet by mouth 4 (four) times daily as needed for moderate pain or severe pain.   Yes [provider]  losartan (COZAAR) 100 MG tablet Take 100 mg by mouth daily.   Yes [provider]  potassium chloride (K-DUR) 10 MEQ tablet TAKE 1 TABLET BY MOUTH ONCE A DAY Patient taking differently: Take 10 mEq by mouth daily. 07/16/15  Yes Roselee Nova, MD  pregabalin (LYRICA) 75 MG capsule Take 1 capsule (75 mg total) by mouth 3 (three) times daily. 01/12/17  Yes Roselee Nova, MD  QUEtiapine (SEROQUEL) 100 MG tablet Take 1 tablet (100 mg total) by mouth at bedtime. 12/13/16  Yes Rochel Brome A, MD  rosuvastatin (CRESTOR) 10 MG tablet Take 10 mg by mouth daily.   Yes [provider]  traMADol (ULTRAM) 50 MG tablet Take 50 mg by mouth 3 (three) times daily as needed for pain.   Yes [provider]    Allergies Penicillins  Family History   Problem Relation Age of Onset   Cancer Mother        Lung   Cancer Father        pancreatic   Breast cancer Neg Hx    Ovarian cancer Neg Hx    Colon cancer Neg Hx    Diabetes Neg Hx    Heart disease Neg Hx     Social History Social History   Tobacco Use   Smoking status: Every Day    Packs/day: 1.00    Years: 30.00    Pack years: 30.00    Types: Cigarettes, E-cigarettes   Smokeless tobacco: Never  Vaping Use   Vaping Use: Every day  Substance Use Topics   Alcohol use: No   Drug use: Yes    Comment: Pt states prescribed oxy and xanax    Review of Systems  Review of Systems  Unable to perform ROS: Acuity of condition    ____________________________________________  PHYSICAL EXAM:  VITAL SIGNS: ED Triage Vitals [06/27/2020 0949]  Enc Vitals Group     BP      Pulse Rate (!) 114     Resp (!) 32     Temp      Temp src      SpO2 100 %     Weight      Height      Head Circumference      Peak Flow      Pain Score      Pain Loc      Pain Edu?      Excl. in [place]?      Physical Exam Vitals and nursing note reviewed.  Constitutional:      General: She is in acute distress.     Appearance: She is well-developed. She is ill-appearing.  HENT:     Head: Normocephalic and atraumatic.  Eyes:     Conjunctiva/sclera: Conjunctivae normal.  Cardiovascular:     Rate and Rhythm: Regular rhythm. Tachycardia present.     Heart sounds: Normal heart sounds.  Pulmonary:     Effort: Tachypnea and respiratory distress present.     Breath sounds: Decreased air movement present. Examination of the right-upper field reveals rhonchi. Examination of the right-middle field reveals rhonchi. Examination of the left-middle field reveals rhonchi. Examination of the right-lower field reveals rhonchi. Examination of the left-lower field reveals rhonchi. Rhonchi present. No wheezing.  Abdominal:     General: There is no distension.  Musculoskeletal:     Cervical back: Neck supple.   Skin:    General: Skin is warm.     Capillary Refill: Capillary refill takes less than 2 seconds.     Findings: No rash.  Neurological:     Mental Status: She is alert and oriented to person, place, and time.     Motor: No abnormal muscle tone.      ____________________________________________   LABS (all labs ordered are listed, but only abnormal results are displayed)  Labs Reviewed  CBC WITH DIFFERENTIAL/PLATELET - Abnormal; Notable for the following components:      Result Value   WBC 20.7 (*)    RBC 3.41 (*)    Hemoglobin 10.6 (*)    HCT 29.2 (*)    MCHC 36.3 (*)    Neutro Abs 15.1 (*)    Monocytes Absolute 2.8 (*)    Abs Immature Granulocytes 0.19 (*)    All other components within normal limits  COMPREHENSIVE METABOLIC PANEL - Abnormal; Notable for the following components:   Sodium 108 (*)    Chloride 76 (*)    Glucose, Bld 103 (*)    Calcium 8.3 (*)    Albumin 3.3 (*)    Alkaline Phosphatase 226 (*)    All other components within normal limits  BLOOD GAS, VENOUS - Abnormal; Notable for the following components:   pH, Ven 7.20 (*)    pO2, Ven 123.0 (*)    Acid-base deficit 6.4 (*)    All other components within normal limits  BRAIN NATRIURETIC PEPTIDE - Abnormal; Notable for the following components:   B Natriuretic Peptide 127.8 (*)    All other components within normal limits  URINE DRUG SCREEN, QUALITATIVE ([facility] ONLY) - Abnormal; Notable for the following components:   Tricyclic, Ur Screen POSITIVE (*)    Opiate, Ur Screen POSITIVE (*)    Benzodiazepine, Ur Scrn POSITIVE (*)    All other components within normal limits  OSMOLALITY, URINE - Abnormal; Notable for  the following components:   Osmolality, Ur 189 (*)    All other components within normal limits  OSMOLALITY - Abnormal; Notable for the following components:   Osmolality 229 (*)    All other components within normal limits  BLOOD GAS, ARTERIAL - Abnormal; Notable for the following components:    pH, Arterial 7.27 (*)    Acid-base deficit 5.3 (*)    All other components within normal limits  RESP PANEL BY RT-PCR (FLU A&B, COVID) ARPGX2  CULTURE, BLOOD (ROUTINE X 2)  CULTURE, BLOOD (ROUTINE X 2)  CULTURE, RESPIRATORY W GRAM STAIN  URINE CULTURE  URINALYSIS, COMPLETE (UACMP) WITH MICROSCOPIC  LACTIC ACID, PLASMA  SODIUM, URINE, RANDOM  CREATININE, URINE, RANDOM  PROTIME-INR  LACTIC ACID, PLASMA  LEGIONELLA PNEUMOPHILA SEROGP 1 UR AG  HIV ANTIBODY (ROUTINE TESTING W REFLEX)  CBC  PROCALCITONIN  URINALYSIS, ROUTINE W REFLEX MICROSCOPIC  BLOOD GAS, ARTERIAL  STREP PNEUMONIAE URINARY ANTIGEN  HEMOGLOBIN A1C  OSMOLALITY, URINE  TROPONIN I (HIGH SENSITIVITY)  TROPONIN I (HIGH SENSITIVITY)    ____________________________________________  EKG: Sinus tachycardia, ventricular rate 113.  PR 181, QRS 90, QTc 445.  No acute ST elevations or depressions.  No EKG evidence of acute ischemia or infarct. ________________________________________  RADIOLOGY All imaging, including plain films, CT scans, and ultrasounds, independently reviewed by me, and interpretations confirmed via formal radiology reads.  ED MD interpretation:   Chest x-ray: Large right-sided effusion and consolidation, ET tube in place    Official radiology report(s): DG Chest Portable 1 View  Result Date: 07/09/2020 CLINICAL DATA:  Status post intubation. EXAM: PORTABLE CHEST 1 VIEW COMPARISON:  09/29/2014 FINDINGS: ETT tip above the carina. There is an NG tube with tip below the GE junction. Moderate to large right pleural effusion. Atelectasis and airspace opacification of the right lower lobe and right middle lobes noted. Diffuse pulmonary vascular congestion noted. The visualized osseous structures are unremarkable. IMPRESSION: 1. Support apparatus positioned as above. 2. Moderate to large right pleural effusion with associated atelectasis and airspace opacification of the right lower lobe and right middle  lobes. 3. Pulmonary vascular congestion. Electronically Signed   By: Kerby Moors M.D.   On: 07/01/2020 11:03    ____________________________________________  PROCEDURES   Procedure(s) performed (including Critical Care):  .Critical Care  Date/Time: 06/27/2020 10:44 AM Performed by: Duffy Bruce, MD Authorized by: Duffy Bruce, MD   Critical care provider statement:    Critical care time (minutes):  35   Critical care time was exclusive of:  Separately billable procedures and treating other patients and teaching time   Critical care was necessary to treat or prevent imminent or life-threatening deterioration of the following conditions:  Cardiac failure, circulatory failure, respiratory failure and sepsis   Critical care was time spent personally by me on the following activities:  Development of treatment plan with patient or surrogate, discussions with consultants, evaluation of patient's response to treatment, examination of patient, obtaining history from patient or surrogate, ordering and performing treatments and interventions, ordering and review of laboratory studies, ordering and review of radiographic studies, pulse oximetry, re-evaluation of patient's condition and review of old charts   I assumed direction of critical care for this patient from another provider in my specialty: no   Procedure Name: Intubation Date/Time: 07/03/2020 10:44 AM Performed by: Duffy Bruce, MD Pre-anesthesia Checklist: Patient identified, Patient being monitored, Emergency Drugs available, Timeout performed and Suction available Oxygen Delivery Method: Non-rebreather mask Preoxygenation: Pre-oxygenation with 100% oxygen Induction Type: Rapid sequence  Ventilation: Mask ventilation without difficulty Laryngoscope Size: Mac and 3 Grade View: Grade I Number of attempts: 1 Airway Equipment and Method: Video-laryngoscopy and Rigid stylet Placement Confirmation: ETT inserted through vocal  cords under direct vision, CO2 detector and Breath sounds checked- equal and bilateral Secured at: 23 cm Tube secured with: ETT holder Dental Injury: Teeth and Oropharynx as per pre-operative assessment  Difficulty Due To: Difficulty was unanticipated Future Recommendations: Recommend- induction with short-acting agent, and alternative techniques readily available     ____________________________________________  INITIAL IMPRESSION / MDM / [place] / ED COURSE  As part of my medical decision making, I reviewed the following data within the [place] notes reviewed and incorporated, Old chart reviewed, Notes from prior ED visits, and [place] Controlled Substance Database       *Tamara Mcclure was evaluated in Emergency Department on 07/08/2020 for the symptoms described in the history of present illness. She was evaluated in the context of the global COVID-19 pandemic, which necessitated consideration that the patient might be at risk for infection with the SARS-CoV-2 virus that causes COVID-19. Institutional protocols and algorithms that pertain to the evaluation of patients at risk for COVID-19 are in a state of rapid change based on information released by regulatory bodies including the CDC and federal and state organizations. These policies and algorithms were followed during the patient's care in the ED.  Some ED evaluations and interventions may be delayed as a result of limited staffing during the pandemic.*     Medical Decision Making: 65 year old female here with acute respiratory failure.  Patient altered in obvious respiratory distress on arrival.  She was placed on BiPAP but was not tolerating due to altered mental status and intolerance of the mask.  Decision subsequently made to intubate.  Tolerated the procedure well.  Lab work, x-ray, concerning for right sided pneumonia, with concern for aspiration given her reported possible opiate misuse.   Differential includes community-acquired pneumonia, COPD exacerbation with pneumonia, CHF with asymmetric edema, possible obstructive lesion of the right mid to lower lung.  Patient given broad-spectrum antibiotics.  Initial lab work shows marked hyponatremia, ? SIADH vs drug-related. Marked leukocytosis noted. Pt started on braod-spectrum ABX. She is hypertensive, tachycardic, with frothy pulm secretions concerning for possible CHF, so fluids held at this time. Will re-consider if labs are more concerning for sepsis. Admitted to ICU. Case discussed with Son who is in agreement with plan.   ____________________________________________  FINAL CLINICAL IMPRESSION(S) / ED DIAGNOSES  Final diagnoses:  Acute respiratory failure with hypoxia ([employer])  Community acquired pneumonia of right lower lobe of lung  Encephalopathy  Hyponatremia     MEDICATIONS GIVEN DURING THIS VISIT:  Medications  fentaNYL 2560mg in NS 2526m(1075mml) infusion-PREMIX (150 mcg/hr Intravenous Rate/Dose Change 06/30/2020 1134)  fentaNYL (SUBLIMAZE) bolus via infusion 50 mcg (50 mcg Intravenous Bolus from Bag 07/08/2020 1125)  metroNIDAZOLE (FLAGYL) IVPB 500 mg (has no administration in time range)  ceFEPIme (MAXIPIME) 2 g in sodium chloride 0.9 % 100 mL IVPB (2 g Intravenous New Bag/Given 07/08/2020 1120)  vancomycin (VANCOREADY) IVPB 1500 mg/300 mL (has no administration in time range)  propofol (DIPRIVAN) 1000 MG/100ML infusion (has no administration in time range)  furosemide (LASIX) injection 40 mg (has no administration in time range)  docusate sodium (COLACE) capsule 100 mg (has no administration in time range)  polyethylene glycol (MIRALAX / GLYCOLAX) packet 17 g (has no administration in time range)  heparin injection 5,000 Units (  has no administration in time range)  pantoprazole (PROTONIX) injection 40 mg (has no administration in time range)  methylPREDNISolone sodium succinate (SOLU-MEDROL) 40 mg/mL injection 40 mg  (has no administration in time range)  insulin aspart (novoLOG) injection 0-15 Units (has no administration in time range)  ipratropium-albuterol (DUONEB) 0.5-2.5 (3) MG/3ML nebulizer solution 3 mL (has no administration in time range)  succinylcholine (ANECTINE) injection 90 mg (90 mg Intravenous Given 06/30/2020 1004)  ipratropium-albuterol (DUONEB) 0.5-2.5 (3) MG/3ML nebulizer solution 3 mL (3 mLs Nebulization Given 06/17/2020 1031)  etomidate (AMIDATE) injection 18 mg (18 mg Intravenous Given 06/23/2020 1004)  fentaNYL (SUBLIMAZE) injection 50 mcg (50 mcg Intravenous Given 07/06/2020 1021)  ipratropium-albuterol (DUONEB) 0.5-2.5 (3) MG/3ML nebulizer solution 3 mL (3 mLs Nebulization Given 06/12/2020 1031)  midazolam (VERSED) injection 4 mg (4 mg Intravenous Given 06/29/2020 1056)     ED Discharge Orders     None        Note:  This document was prepared using Dragon voice recognition software and may include unintentional dictation errors.   Duffy Bruce, MD 07/01/2020 [phone]

## 2020-07-02 NOTE — Progress Notes (Signed)
PCCM note  Bedside ultrasound performed, moderate right effusion noted with partial loculation Will place a chest tube for drainage.  If there are persistent loculations and effusion then consider DNase tPA instillation  Marshell Garfinkel MD [facility] See Amion for pager  If no response to pager , please call [phone] until 7pm After 7:00 pm call [place]ink  [phone] 06/29/2020, 5:17 PM

## 2020-07-02 NOTE — ED Notes (Addendum)
Message sent to [facility], MD reguarding patient's BP 74/52. Per MD give 1 L NS and hold lasix.

## 2020-07-02 NOTE — H&P (Addendum)
NAME:  Tamara Mcclure, MRN:  [id number], DOB:  15-May-1955, LOS: 0 ADMISSION DATE:  06/16/2020, CONSULTATION DATE: 07/09/2020 REFERRING MD:  Lindell Noe MD, CHIEF COMPLAINT:   Acute respiratory failure, pneumonia  History of Present Illness:   65 year old with history of COPD, depression, hypertension, panic attacks presenting with altered mental status, acute on chronic respiratory failure.  Patient had a fall a few days ago, seen at local urgent care.  Has been taking increasing doses of opiate.  In the ED she failed Narcan and BiPAP and got intubated.  PCCM consulted for admission  Pertinent  Medical History    has a past medical history of Anxiety, Chronic lower back pain, COPD (chronic obstructive pulmonary disease) ([place]), Depression, Hypertension, Panic disorder with agoraphobia and severe panic attacks, and Peripheral sensory neuropathy.   Significant Hospital Events: Including procedures, antibiotic start and stop dates in addition to other pertinent events   6/23-admit, intubated  Interim History / Subjective:    Objective   Blood pressure (!) 143/100, pulse (!) 114, resp. rate (!) 32, weight 78 kg, SpO2 100 %.       No intake or output data in the 24 hours ending 06/20/2020 1103 Filed Weights   07/06/2020 1046  Weight: 78 kg    Examination: Blood pressure 138/85, pulse (!) 116, temperature (!) 94 F (34.4 C), resp. rate 20, weight 78 kg, SpO2 96 %. Gen:      No acute distress, chronically ill-appearing HEENT:  EOMI, sclera anicteric Neck:     No masses; no thyromegaly Lungs:    Bilateral expiratory wheeze, crackle CV:         Regular rate and rhythm; no murmurs Abd:      + bowel sounds; soft, non-tender; no palpable masses, no distension Ext:    No edema; adequate peripheral perfusion Skin:      Warm and dry; no rash Neuro: alert and oriented x 3 Psych: normal mood and affect   Labs/imaging that I havepersonally reviewed  (right click and "Reselect all SmartList  Selections" daily)   Sodium 108, BUN/creatinine 13/0.97, BNP 128 WBC elevated at 20.7, hemoglobin 10.6, platelets 296 Urine drug screen positive for benzos, opiates, tricyclics  Chest x-ray 0/34 right lung consolidation with pleural effusion, pulmonary vascular congestion  Resolved Hospital Problem list     Assessment & Plan:  Acute on chronic respiratory failure due to COPD exacerbation and right lower lobe pneumonia Sepsis present on admission secondary to pneumonia Continue antibiotics, CT chest, abdomen pelvis Consider thoracentesis versus chest tube if there is significant effusion Start steroids, bronchodilators with nebs Check echocardiogram, Lasix for diuresis Check urine Legionella and pneumococcus Follow cultures Nebs as needed  Hyponatremia, ? SIADH Checking urine and serum osmolarity  Acute encephalopathy secondary to sepsis Chronic opiate abuse CT head ordered  Best Practice (right click and "Reselect all SmartList Selections" daily)   Diet/type: NPO Pain/Anxiety/Delirium protocol RASS goal -1 VAP protocol (if indicated): Yes DVT prophylaxis: prophylactic heparin  GI prophylaxis: PPI Glucose control:  not indicated Central venous access:  N/A Arterial line:  N/A Foley:  N/A Mobility:  bed rest  PT consulted: N/A Studies pending: CT  Culture data pending:sputum, urine , and blood Last reviewed culture data:today Antibiotics:cefepime, vanc, and flagyl  Antibiotic de-escalation: no,  continue current rx Stop date: to be determined  Daily labs: requested Code Status:  full code Last date of multidisciplinary goals of care discussion []  ccm prognosis: Life-threating Disposition: remains critically ill, will stay in  intensive care        Labs   CBC: Recent Labs  Lab 06/29/2020 0947  WBC 20.7*  NEUTROABS 15.1*  HGB 10.6*  HCT 29.2*  MCV 85.6  PLT 962    Basic Metabolic Panel: Recent Labs  Lab 06/21/2020 0947  NA 108*  K 4.1  CL 76*   CO2 22  GLUCOSE 103*  BUN 13  CREATININE 0.97  CALCIUM 8.3*   GFR: CrCl cannot be calculated (Unknown ideal weight.). Recent Labs  Lab 06/29/2020 0947  WBC 20.7*    Liver Function Tests: Recent Labs  Lab 06/13/2020 0947  AST 39  ALT 29  ALKPHOS 226*  BILITOT 0.9  PROT 7.0  ALBUMIN 3.3*   No results for input(s): LIPASE, AMYLASE in the last 168 hours. No results for input(s): AMMONIA in the last 168 hours.  ABG    Component Value Date/Time   HCO3 21.9 06/14/2020 0954   ACIDBASEDEF 6.4 (H) 06/25/2020 0954   O2SAT 97.9 06/15/2020 0954     Coagulation Profile: No results for input(s): INR, PROTIME in the last 168 hours.  Cardiac Enzymes: No results for input(s): CKTOTAL, CKMB, CKMBINDEX, TROPONINI in the last 168 hours.  HbA1C: Hgb A1c MFr Bld  Date/Time Value Ref Range Status  03/11/2013 12:00 AM 5.8 4.0 - 6.0 % Final    CBG: No results for input(s): GLUCAP in the last 168 hours.  Review of Systems:   Unable to obtain due to altered mental status  Past Medical History:  She,  has a past medical history of Anxiety, Chronic lower back pain, COPD (chronic obstructive pulmonary disease) ([place]), Depression, Hypertension, Panic disorder with agoraphobia and severe panic attacks, and Peripheral sensory neuropathy.   Surgical History:   Past Surgical History:  Procedure Laterality Date   I & D EXTREMITY Right 02/09/2017   Procedure: IRRIGATION AND DEBRIDEMENT EXTREMITY;  Surgeon: Lovell Sheehan, MD;  Location: [facility] ORS;  Service: Orthopedics;  Laterality: Right;   none       Social History:   reports that she has been smoking cigarettes and e-cigarettes. She has a 30.00 pack-year smoking history. She has never used smokeless tobacco. She reports current drug use. She reports that she does not drink alcohol.   Family History:  Her family history includes Cancer in her father and mother. There is no history of Breast cancer, Ovarian cancer, Colon cancer,  Diabetes, or Heart disease.   Allergies Allergies  Allergen Reactions   Penicillins Rash    Has patient had a PCN reaction causing immediate rash, facial/tongue/throat swelling, SOB or lightheadedness with hypotension: Yes Has patient had a PCN reaction causing severe rash involving mucus membranes or skin necrosis: No Has patient had a PCN reaction that required hospitalization: No Has patient had a PCN reaction occurring within the last 10 years: No If all of the above answers are "NO", then may proceed with Cephalosporin use.     Home Medications  Prior to Admission medications   Medication Sig Start Date End Date Taking? Authorizing Provider  albuterol (PROVENTIL HFA;VENTOLIN HFA) 108 (90 Base) MCG/ACT inhaler Inhale 2 puffs into the lungs every 6 (six) hours as needed for wheezing or shortness of breath. 11/16/15   Roselee Nova, MD  alprazolam Duanne Moron) 2 MG tablet Take 1 tablet (2 mg total) by mouth 3 (three) times daily as needed for anxiety. 01/12/17   Roselee Nova, MD  carisoprodol (SOMA) 350 MG tablet Take 1 tablet (350 mg  total) by mouth 3 (three) times daily. 01/12/17   Roselee Nova, MD  COMBIVENT RESPIMAT 20-100 MCG/ACT AERS respimat INHALE 1 PUFF INTO THE LUNGS EVERY 6 HOURS AS NEEDED FOR WHEEZING OR SHORTNESS OF BREATH 02/15/17   Rochel Brome A, MD  fluticasone furoate-vilanterol (BREO ELLIPTA) 100-25 MCG/INH AEPB Inhale 1 puff into the lungs daily. 11/26/15   Roselee Nova, MD  ibuprofen (ADVIL,MOTRIN) 200 MG tablet Take 200 mg by mouth every 6 (six) hours as needed for mild pain.     [provider]  potassium chloride (K-DUR) 10 MEQ tablet TAKE 1 TABLET BY MOUTH ONCE A DAY 07/16/15   Roselee Nova, MD  pregabalin (LYRICA) 75 MG capsule Take 1 capsule (75 mg total) by mouth 3 (three) times daily. 01/12/17   Roselee Nova, MD  QUEtiapine (SEROQUEL) 100 MG tablet Take 1 tablet (100 mg total) by mouth at bedtime. 12/13/16   Roselee Nova, MD     Critical care time:    The patient is critically ill with multiple organ system failure and requires high complexity decision making for assessment and support, frequent evaluation and titration of therapies, advanced monitoring, review of radiographic studies and interpretation of complex data.   Critical Care Time devoted to patient care services, exclusive of separately billable procedures, described in this note is 45 minutes.   Marshell Garfinkel MD [facility] See Amion for pager  If no response to pager , please call [phone] until 7pm After 7:00 pm call Elink  [phone] 06/16/2020, 11:36 AM

## 2020-07-02 NOTE — ED Notes (Signed)
Hand mittens placed on patient at this time

## 2020-07-02 NOTE — Consult Note (Signed)
Pharmacy Antibiotic Note  Tamara Mcclure is a 65 y.o. female admitted on 06/15/2020 with HCAP/possible aspiration.    Pharmacy has been consulted for Vancomycin/Cefepime/Metronidazole dosing.  Plan: Will give Vancomycin 1500mg  IV loading dose, followed by  Vancomycin 1250 mg IV Q 24 hrs.  Goal AUC 400-550. Expected AUC: 456.7 SCr used: 0.97  (MRSA PCR ordered)  Will start cefepime 2g q8h  Will start metronidazole 500mg  IV q8h  Height: 5\' 5"  (165.1 cm) Weight: 78 kg (171 lb 15.3 oz) IBW/kg (Calculated) : 57  Temp (24hrs), Avg:94 F (34.4 C), Min:94 F (34.4 C), Max:94.2 F (34.6 C)  Recent Labs  Lab 06/22/2020 0947  WBC 20.7*  CREATININE 0.97  LATICACIDVEN 0.6    Estimated Creatinine Clearance: 61.3 mL/min (by C-G formula based on SCr of 0.97 mg/dL).    Allergies  Allergen Reactions   Penicillins Rash    Has patient had a PCN reaction causing immediate rash, facial/tongue/throat swelling, SOB or lightheadedness with hypotension: Yes Has patient had a PCN reaction causing severe rash involving mucus membranes or skin necrosis: No Has patient had a PCN reaction that required hospitalization: No Has patient had a PCN reaction occurring within the last 10 years: No If all of the above answers are "NO", then may proceed with Cephalosporin use.    Antimicrobials this admission: Vancomycin 6/23 >> Cefepime 6/23 >> Metronidazole 6/23 >>  Dose adjustments this admission: None  Microbiology results: 6/23 BCx: pending 6/23 UCx: pending  6/23 Respiratory from Tracheal Aspirate pending 6/23 MRSA PCR: pending  COVID/FLU NEG  Thank you for allowing pharmacy to be a part of this patient's care.  Lu Duffel, PharmD, BCPS Clinical Pharmacist 07/04/2020 12:54 PM

## 2020-07-02 NOTE — Procedures (Signed)
Central Venous Catheter Insertion Procedure Note  Tamara Mcclure  [id number]  Jul 15, 1955  Date:06/30/2020  Time:4:30 PM   Provider Performing:Novah Goza D Dewaine Conger   Procedure: Insertion of Non-tunneled Central Venous [phone]) with US guidance (03403)   Indication(s) Medication administration and Difficult access  Consent Risks of the procedure as well as the alternatives and risks of each were explained to the patient and/or caregiver.  Consent for the procedure was obtained and is signed in the bedside chart  Anesthesia Topical only with 1% lidocaine   Timeout Verified patient identification, verified procedure, site/side was marked, verified correct patient position, special equipment/implants available, medications/allergies/relevant history reviewed, required imaging and test results available.  Sterile Technique Maximal sterile technique including full sterile barrier drape, hand hygiene, sterile gown, sterile gloves, mask, hair covering, sterile ultrasound probe cover (if used).  Procedure Description Area of catheter insertion was cleaned with chlorhexidine and draped in sterile fashion.  With real-time ultrasound guidance a central venous catheter was placed into the right internal jugular vein. Nonpulsatile blood flow and easy flushing noted in all ports.  The catheter was sutured in place and sterile dressing applied.  Complications/Tolerance None; patient tolerated the procedure well. Chest X-ray is ordered to verify placement for internal jugular or subclavian cannulation.   Chest x-ray is not ordered for femoral cannulation.  EBL Minimal  Specimen(s) None   Line secured at the 16 cm mark.  BIOPATCH applied to the insertion site.    Tamara Mcclure, AGACNP-BC [facility] Prefer epic messenger for cross cover needs If after hours, please call E-link

## 2020-07-02 NOTE — Progress Notes (Signed)
*  PRELIMINARY RESULTS* Echocardiogram 2D Echocardiogram has been performed.  Tamara Mcclure 06/22/2020, 2:33 PM

## 2020-07-02 NOTE — Consult Note (Signed)
PHARMACY -  BRIEF ANTIBIOTIC NOTE   Pharmacy has received consult(s) for Cefepime/Vancomycin from an ED provider.    The patient's profile has been reviewed for ht/wt/allergies/indication/available labs.    One time order(s) placed for:   Cefepime 2g IV x 1 (pt with allergy of rash listed for PCN but with previous hospital administration of Cefazolin without noted reaction)  Vancomycin 1500 mg IV x 1  Further antibiotics/pharmacy consults should be ordered by admitting physician if indicated.                        Thank you,  Lu Duffel, PharmD, BCPS Clinical Pharmacist 07/01/2020 10:48 AM

## 2020-07-03 ENCOUNTER — Inpatient Hospital Stay: Payer: Medicare Other

## 2020-07-03 LAB — BASIC METABOLIC PANEL
Anion gap: 10 (ref 5–15)
BUN: 13 mg/dL (ref 8–23)
CO2: 23 mmol/L (ref 22–32)
Calcium: 8.5 mg/dL — ABNORMAL LOW (ref 8.9–10.3)
Chloride: 93 mmol/L — ABNORMAL LOW (ref 98–111)
Creatinine, Ser: 0.76 mg/dL (ref 0.44–1.00)
GFR, Estimated: >60 mL/min (ref >60)
Glucose, Bld: 156 mg/dL — ABNORMAL HIGH (ref 70–99)
Potassium: 3.7 mmol/L (ref 3.5–5.1)
Sodium: 126 mmol/L — ABNORMAL LOW (ref 135–145)

## 2020-07-03 LAB — CBC
HCT: 27.4 % — ABNORMAL LOW (ref 36.0–46.0)
Hemoglobin: 9.8 g/dL — ABNORMAL LOW (ref 12.0–15.0)
MCH: 30.8 pg (ref 26.0–34.0)
MCHC: 35.8 g/dL (ref 30.0–36.0)
MCV: 86.2 fL (ref 80.0–100.0)
Platelets: 341 10*3/uL (ref 150–400)
RBC: 3.18 MIL/uL — ABNORMAL LOW (ref 3.87–5.11)
RDW: 12.4 % (ref 11.5–15.5)
WBC: 19.1 10*3/uL — ABNORMAL HIGH (ref 4.0–10.5)
nRBC: 0 % (ref 0.0–0.2)

## 2020-07-03 LAB — BODY FLUID CELL COUNT WITH DIFFERENTIAL
Eos, Fluid: 0 %
Lymphs, Fluid: 7 %
Monocyte-Macrophage-Serous Fluid: 12 %
Neutrophil Count, Fluid: 81 %
Total Nucleated Cell Count, Fluid: 5118 cu mm

## 2020-07-03 LAB — TRIGLYCERIDES: Triglycerides: 47 mg/dL (ref <150)

## 2020-07-03 LAB — GLUCOSE, CAPILLARY
Glucose-Capillary: 139 mg/dL — ABNORMAL HIGH (ref 70–99)
Glucose-Capillary: 144 mg/dL — ABNORMAL HIGH (ref 70–99)
Glucose-Capillary: 156 mg/dL — ABNORMAL HIGH (ref 70–99)
Glucose-Capillary: 164 mg/dL — ABNORMAL HIGH (ref 70–99)
Glucose-Capillary: 158 mg/dL — ABNORMAL HIGH (ref 70–99)
Glucose-Capillary: 154 mg/dL — ABNORMAL HIGH (ref 70–99)

## 2020-07-03 LAB — OSMOLALITY: Osmolality: 229 mOsm/kg — CL (ref 275–295)

## 2020-07-03 LAB — COMPREHENSIVE METABOLIC PANEL
ALT: 23 U/L (ref 0–44)
AST: 23 U/L (ref 15–41)
Albumin: 3 g/dL — ABNORMAL LOW (ref 3.5–5.0)
Alkaline Phosphatase: 184 U/L — ABNORMAL HIGH (ref 38–126)
Anion gap: 9 (ref 5–15)
BUN: 15 mg/dL (ref 8–23)
CO2: 24 mmol/L (ref 22–32)
Calcium: 8.5 mg/dL — ABNORMAL LOW (ref 8.9–10.3)
Chloride: 91 mmol/L — ABNORMAL LOW (ref 98–111)
Creatinine, Ser: 0.84 mg/dL (ref 0.44–1.00)
GFR, Estimated: >60 mL/min (ref >60)
Glucose, Bld: 136 mg/dL — ABNORMAL HIGH (ref 70–99)
Potassium: 4.2 mmol/L (ref 3.5–5.1)
Sodium: 124 mmol/L — ABNORMAL LOW (ref 135–145)
Total Bilirubin: 0.7 mg/dL (ref 0.3–1.2)
Total Protein: 6.6 g/dL (ref 6.5–8.1)

## 2020-07-03 LAB — HEMOGLOBIN A1C
Hgb A1c MFr Bld: 5.8 % — ABNORMAL HIGH (ref 4.8–5.6)
Mean Plasma Glucose: 120 mg/dL

## 2020-07-03 LAB — SODIUM
Sodium: 125 mmol/L — ABNORMAL LOW (ref 135–145)
Sodium: 125 mmol/L — ABNORMAL LOW (ref 135–145)

## 2020-07-03 LAB — PHOSPHORUS: Phosphorus: 5.1 mg/dL — ABNORMAL HIGH (ref 2.5–4.6)

## 2020-07-03 LAB — LEGIONELLA PNEUMOPHILA SEROGP 1 UR AG: L. pneumophila Serogp 1 Ur Ag: NEGATIVE

## 2020-07-03 LAB — MAGNESIUM: Magnesium: 1.7 mg/dL (ref 1.7–2.4)

## 2020-07-03 LAB — STREP PNEUMONIAE URINARY ANTIGEN: Strep Pneumo Urinary Antigen: NEGATIVE

## 2020-07-03 MED ORDER — PROSOURCE TF PO LIQD
45.0000 mL | Freq: Every day | ORAL | Status: DC
  Administered 2020-07-04 – 2020-07-05 (×2): 45 mL
  Filled 2020-07-03 (×3): qty 45

## 2020-07-03 MED ORDER — VANCOMYCIN HCL 750 MG/150ML IV SOLN
750.0000 mg | Freq: Two times a day (BID) | INTRAVENOUS | Status: DC
  Administered 2020-07-03 – 2020-07-06 (×6): 750 mg via INTRAVENOUS
  Filled 2020-07-03 (×7): qty 150

## 2020-07-03 MED ORDER — ADULT MULTIVITAMIN LIQUID CH
15.0000 mL | Freq: Every day | ORAL | Status: DC
  Administered 2020-07-04 – 2020-07-05 (×2): 15 mL
  Filled 2020-07-03 (×5): qty 15

## 2020-07-03 MED ORDER — SODIUM CHLORIDE 0.9 % IV SOLN
2.0000 g | INTRAVENOUS | Status: DC
  Administered 2020-07-03: 2 g via INTRAVENOUS
  Filled 2020-07-03: qty 20
  Filled 2020-07-03: qty 2

## 2020-07-03 MED ORDER — MIDODRINE HCL 5 MG PO TABS
10.0000 mg | ORAL_TABLET | Freq: Three times a day (TID) | ORAL | Status: DC
  Administered 2020-07-03 – 2020-07-06 (×8): 10 mg
  Filled 2020-07-03 (×8): qty 2

## 2020-07-03 MED ORDER — FREE WATER
30.0000 mL | Status: DC
  Administered 2020-07-03 – 2020-07-06 (×17): 30 mL

## 2020-07-03 MED ORDER — SODIUM CHLORIDE 0.9 % IV SOLN
2.0000 g | INTRAVENOUS | Status: DC
  Filled 2020-07-03: qty 20

## 2020-07-03 MED ORDER — SODIUM CHLORIDE 0.9 % IV SOLN: 2.0000 g | INTRAVENOUS | Status: DC

## 2020-07-03 MED ORDER — CHLORHEXIDINE GLUCONATE 0.12% ORAL RINSE (MEDLINE KIT)
15.0000 mL | Freq: Two times a day (BID) | OROMUCOSAL | Status: DC
  Administered 2020-07-03 – 2020-07-07 (×9): 15 mL via OROMUCOSAL

## 2020-07-03 MED ORDER — VITAL HIGH PROTEIN PO LIQD
1000.0000 mL | ORAL | Status: DC
  Administered 2020-07-03 – 2020-07-06 (×3): 1000 mL

## 2020-07-03 MED ORDER — DEXTROSE 5 % IV SOLN: INTRAVENOUS | Status: DC

## 2020-07-03 MED ORDER — ORAL CARE MOUTH RINSE
15.0000 mL | OROMUCOSAL | Status: DC
  Administered 2020-07-03 – 2020-07-07 (×45): 15 mL via OROMUCOSAL

## 2020-07-03 NOTE — Consult Note (Signed)
PHARMACY CONSULT NOTE - FOLLOW UP  Pharmacy Consult for Electrolyte Monitoring and Replacement   Recent Labs: Potassium (mmol/L)  Date Value  07/03/2020 4.2  04/12/2014 3.4 (L)   Magnesium (mg/dL)  Date Value  07/03/2020 1.7  04/12/2014 1.4 (L)   Calcium (mg/dL)  Date Value  07/03/2020 8.5 (L)   Calcium, Total (mg/dL)  Date Value  04/12/2014 7.8 (L)   Albumin (g/dL)  Date Value  07/03/2020 3.0 (L)  01/12/2017 4.8  04/12/2014 3.0 (L)   Phosphorus (mg/dL)  Date Value  07/03/2020 5.1 (H)   Sodium (mmol/L)  Date Value  07/03/2020 124 (L)  01/12/2017 130 (L)  04/12/2014 136   Corrected Ca 8.86  Assessment: 65 year old with history of COPD, HTN, presenting with AMS and acute on chronic respiratory failure. Recent increase doses of opiate following fall -  failed Narcan and BiPAP in ED now intubated and transferring to CCU. On initial presentation, Pt severely hyponatremic with Na 108 and Urine osmolality 97 and urine sodium <10. Pharmacy consulted to monitor and replenish electrolytes per E-link protocol.  Na: 108> 119 (+11/12hrs)> 124 (+5/7hrs) On NS@50ml  x12hrs (6/24 AM>> On lasix 40mg  IV q12h Cl: 76>89>91  Gluc: 100-150s Phos: 5.1 Mg: 1.7  Goal of Therapy:  Electrolytes wnl  Plan:  No Ca or K replenishment warranted at this time. Na and Cl trending appropriately - will follow hyponatremia labs for plan. Phos mildly elevated and Mg WNL.  Will CTM with AM labs; no further repletion today.  Lorna Dibble ,PharmD Clinical Pharmacist 07/03/2020 8:44 AM

## 2020-07-03 NOTE — Consult Note (Signed)
Pharmacy Antibiotic Note  Tamara Mcclure is a 65 y.o. female admitted on 06/30/2020 with HCAP/possible aspiration.    Pharmacy has been consulted for Vancomycin/Cefepime/Metronidazole dosing.  Plan: Will give Vancomycin 1500mg  IV loading dose, followed by  Vancomycin 1250 mg IV Q 24 hrs.  Goal AUC 400-550. Expected AUC: 456.7 [SCr used: 0.97] (MRSA PCR - positive) Cefepime 2g q8h --> Ceftriaxone 2g q24h Metronidazole 500mg  IV q8h Azithromycin 500mg  IV q24h --> stopped  Height: 5\' 5"  (165.1 cm) Weight: 69.3 kg (152 lb 12.5 oz) IBW/kg (Calculated) : 57  Temp (24hrs), Avg:95.6 F (35.3 C), Min:92.3 F (33.5 C), Max:100.04 F (37.8 C)  Recent Labs  Lab 06/23/2020 0947 06/29/2020 1325 06/22/2020 2200 07/03/20 0442  WBC 20.7*  --   --  19.1*  CREATININE 0.97  --  0.72 0.84  LATICACIDVEN 0.6 1.1  --   --      Estimated Creatinine Clearance: 67 mL/min (by C-G formula based on SCr of 0.84 mg/dL).    Allergies  Allergen Reactions   Penicillins Rash    Has patient had a PCN reaction causing immediate rash, facial/tongue/throat swelling, SOB or lightheadedness with hypotension: Yes Has patient had a PCN reaction causing severe rash involving mucus membranes or skin necrosis: No Has patient had a PCN reaction that required hospitalization: No Has patient had a PCN reaction occurring within the last 10 years: No If all of the above answers are "NO", then may proceed with Cephalosporin use.    Antimicrobials this admission: Vancomycin 6/23 >> Cefepime (6/23-24); then Ceftriaxone (6/24>> Metronidazole 6/23 >> Azithromycin (6/23-24)  Dose adjustments this admission: CTM and adjust PRN renal fxn.  Microbiology results: 6/23 BCx: NGTD 6/23 UCx: NGTD  6/23 Respiratory from Tracheal Aspirate - moderate GPC in pairs; rare GPR's 6/23 MRSA PCR: Positive 6/23 COVID/FLU - NEG  Thank you for allowing pharmacy to be a part of this patient's care.  Lorna Dibble, PharmD Clinical  Pharmacist 07/03/2020 9:28 AM

## 2020-07-03 NOTE — Progress Notes (Signed)
eLink Physician-Brief Progress Note Patient Name: Tamara Mcclure DOB: Mar 09, 1955 MRN: [id number]   Date of Service  07/03/2020  HPI/Events of Note  Hyponatremia - Na+ = 108 --> 119 --> 124. 0.9 NaCl now at [facility] rate. Currently on Lasix 40 mg IV Q 12 hours.  eICU Interventions  Plan: BMP at 11 AM. Hold 11:45 AM Lasix dose pending results of 11 AM BMP.     Intervention Category Major Interventions: Electrolyte abnormality - evaluation and management  Frederico Gerling Cornelia Copa 07/03/2020, 6:49 AM

## 2020-07-03 NOTE — Progress Notes (Signed)
Initial Nutrition Assessment  DOCUMENTATION CODES:   Not applicable  INTERVENTION:   Vital HP @45ml /hr + ProSource 37ml daily via tube   Free water flushes 78ml q4 hours to maintain tube patency   Propofol: 23.4 ml/hr- provides 617kcal/day   Regimen provides 1737kcal/day, 106g/day protein and 1070ml/day free water   Liquid MVI daily via tube   Pt at high refeed risk; recommend monitor potassium, magnesium and phosphorus labs daily until stable  NUTRITION DIAGNOSIS:   Inadequate oral intake related to inability to eat (pt sedated and ventilated) as evidenced by NPO status.  GOAL:   Provide needs based on ASPEN/SCCM guidelines  MONITOR:   Vent status, Labs, Weight trends, Skin, TF tolerance, I & O's  REASON FOR ASSESSMENT:   Ventilator    ASSESSMENT:   65 year old female with history of COPD, depression, anxiety, hypertension and panic attacks who is admitted with sepsis and PNA requiring intubation for respiratory failure  Pt with pleural effusion s/p chest tube placement 6/23  Pt sedated and ventilated. OGT in place. Plan is to start tube feeds today. There is no recent weight history in chart to determine if any recent significant weight changes. Pt with hyponatremia.   Medications reviewed and include: heparin, insulin, solu-medrol, protonix, ceftriaxone, 5% dextrose @75ml /hr, metronidazole, levophed, propofol, vancomycin   Labs reviewed: Na 126(L), K 3.7 wnl, P 5.1(H), Mg 1.7 wnl Wbc- 19.1(H), Hgb 9.8(L), Hct 27.4(L) Cbgs- 139, 144, 156 x 24 hrs AIC 5.8(H)- 6/23  Patient is currently intubated on ventilator support MV: 8.0 L/min Temp (24hrs), Avg:96.6 F (35.9 C), Min:92.3 F (33.5 C), Max:100.04 F (37.8 C)  Propofol: 23.4 ml/hr- provides 617kcal/day   MAP- >46mmHg   UOP- 5275ml   NUTRITION - FOCUSED PHYSICAL EXAM:  Flowsheet Row Most Recent Value  Orbital Region No depletion  Upper Arm Region No depletion  Thoracic and Lumbar Region No  depletion  Buccal Region No depletion  Temple Region No depletion  Clavicle Bone Region No depletion  Clavicle and Acromion Bone Region No depletion  Scapular Bone Region No depletion  Dorsal Hand No depletion  Patellar Region Moderate depletion  Anterior Thigh Region Moderate depletion  Posterior Calf Region Moderate depletion  Edema (RD Assessment) Mild   Hair Reviewed  Eyes Reviewed  Mouth Reviewed  Skin Reviewed  Nails Reviewed      Diet Order:   Diet Order             Diet NPO time specified  Diet effective now                  EDUCATION NEEDS:   No education needs have been identified at this time  Skin:  Skin Assessment: Reviewed RN Assessment (ecchymosis)  Last BM:  pta  Height:   Ht Readings from Last 1 Encounters:  07/03/20 5\' 5"  (1.651 m)    Weight:   Wt Readings from Last 1 Encounters:  07/03/20 69.3 kg    Ideal Body Weight:  56.8 kg  BMI:  Body mass index is 25.42 kg/m.  Estimated Nutritional Needs:   Kcal:  1552kcal/day  Protein:  90-105g/day  Fluid:  1.7-2.0L/day  Koleen Distance MS, RD, LDN Please refer to [facility] for RD and/or RD on-call/weekend/after hours pager

## 2020-07-03 NOTE — Progress Notes (Signed)
Updated pt's son [place] via telephone of his mother's status and current plan of care.  He is thankful for update.  All questions answered.    Darel Hong, AGACNP-BC  Pulmonary & Critical Care Prefer epic messenger for cross cover needs If after hours, please call E-link

## 2020-07-03 NOTE — Progress Notes (Addendum)
NAME:  Tamara Mcclure, MRN:  [id number], DOB:  [birth year], LOS: 1 ADMISSION DATE:  07/08/2020, CONSULTATION DATE: 07/08/2020 REFERRING MD:  Lindell Noe MD, CHIEF COMPLAINT:   Acute respiratory failure, pneumonia  History of Present Illness:   65 year old with history of COPD, depression, hypertension, panic attacks presenting with altered mental status, acute on chronic respiratory failure.  Patient had a fall a few days ago, seen at local urgent care.  Has been taking increasing doses of opiate.  In the ED she failed Narcan and BiPAP and got intubated.  PCCM consulted for admission  Pertinent  Medical History    has a past medical history of Anxiety, Chronic lower back pain, COPD (chronic obstructive pulmonary disease) ([place]), Depression, Hypertension, Panic disorder with agoraphobia and severe panic attacks, and Peripheral sensory neuropathy.   Micro Data:  06/28/2020: SARS-CoV-2 and influenza PCR>> negative 06/30/2020: HIV>> nonreactive 06/30/2020: Strep pneumo urinary antigen>> negative 07/02/2021: Pleural fluid>> 07/02/2021: Blood culture>> 06/17/2020: MRSA PCR>> positive 07/01/2020: Tracheal aspirate>> gram-positive cocci in pairs, gram-negative positive rods 07/02/2021: Urine>>  Antimicrobials:  Cefepime 6/23>> 6/24 Ceftriaxone 6/24>> Flagyl 6/23>> Vancomycin 6/23>>  3 significant Hospital Events: Including procedures, antibiotic start and stop dates in addition to other pertinent events   6/23-admit, intubated, right chest tube placed, right IJ CVC placed 6/24: Weaning vent settings and Levophed (down to 8 mcg). Na+ corrected to 124 this morning (108 on admission) ~ D5 @ 75 started. Tracheal aspirate with Gram+ cocci in pairs & rare gram + rods ~ Cefepime changed to Ceftriaxone, Azithromycin d/c.  Pleural fluid consistent with EXUDATE ~ pleural fluid culture pending  Interim History / Subjective:  -No acute events noted overnight -Afebrile, weaning down Levophed (currently at 8  mcg) -Na+ corrected to 124 this morning (108 on admission) ~ D5 @ 75 started with q4h Na checks -WBC improved to 19.1 (20.7) -Tracheal aspirate with Gram + cocci in pair, rare gram + rods (Azithromycin d/c)  Objective   Blood pressure 92/62, pulse 79, temperature 98.42 F (36.9 C), resp. rate 16, height 5' 5" (1.651 m), weight 69.3 kg, SpO2 96 %.    Vent Mode: PRVC FiO2 (%):  [30 %-40 %] 30 % Set Rate:  [16 bmp] 16 bmp Vt Set:  [450 mL] 450 mL PEEP:  [5 cmH20] 5 cmH20 Plateau Pressure:  [15 cmH20-20 cmH20] 20 cmH20   Intake/Output Summary (Last 24 hours) at 07/03/2020 1327 Last data filed at 07/03/2020 1200 Gross per 24 hour  Intake 3495.76 ml  Output 5310 ml  Net -1814.24 ml   Filed Weights   06/22/2020 1046 06/13/2020 1308 07/03/20 0444  Weight: 78 kg 70.1 kg 69.3 kg    Examination:  Gen:   Acute on chronically ill-appearing female, sitting in bed, intubated and sedated, no acute distress HEENT: Atraumatic, normocephalic, EOMI, sclera anicteric Neck:     No masses; no thyromegaly, no JVD Lungs:    Coarse breath sounds throughout with expiratory wheezing to the left, synchronous with the vent, even CV:         Regular rate and rhythm; 1 S2, no M/R/G Abd:      + bowel sounds; soft, non-tender; no palpable masses, no distension Ext:    No edema; adequate peripheral perfusion Skin:      Warm and dry; no obvious rashes, lesions, ulcerations Neuro: Lightly sedated, moves all extremities to command and follows commands, intermittent periods of restlessness/agitation pupils PERRLA Psych: Unable to assess due to intubation  Labs/imaging that I havepersonally reviewed  (  right click and "Reselect all SmartList Selections" daily)   Labs 07/03/2020: Sodium 124, chloride 91, glucose 136, BUN 15, creatinine 0.84, WBC 19.1, hemoglobin 9.8  Chest x-ray 6/24: Endotracheal tube is seen 4.7 cm above the carina. Nasogastric tube tip overlies the proximal body of the stomach. Right internal  jugular central venous catheter tip overlies the superior vena cava. Right basilar pigtail chest tube is unchanged.Pulmonary insufflation is stable. Bibasilar pulmonary infiltrate has improved. Stable partial right lower lobe collapse. No pneumothorax or pleural effusion. Cardiac size within normal limits. Pulmonary vascularity is normal. CT head 6/23>>1. No acute intracranial abnormalities. 2. Chronic sinus inflammation. CT cervical spine 6/23>>No evidence for cervical spine fracture.  Advanced cervical spondylosis CT abdomen pelvis 6/23>>1. No definite CT evidence of acute traumatic injury to the chest, abdomen, or pelvis. 2. Large right pleural effusion and associated atelectasis or consolidation. No displaced rib fracture or other evident etiology. 3. There are wedge deformities of the T7 and T8 vertebral bodies, proximally 30% anterior height loss, which are new compared to most recent imaging of the chest dated 09/29/2014 but age indeterminate. Correlate for acute point tenderness. 4. Emphysema. 5. Coronary artery disease.  Resolved Hospital Problem list     Assessment & Plan:   Acute on chronic respiratory failure due to COPD exacerbation right lower lobe pneumonia, & large Right Pleural Effusion -Full vent support, implement lung protective strategies -Wean FiO2 and PEEP as tolerated to maintain O2 sats 88 to 92% -Follow intermittent chest x-ray and ABG as needed -Spontaneous breathing trials when respiratory parameters met and mental status permits -Implement VAP bundle -Bronchodilators and IV steroids -Continue antibiotics -Status post chest tube placement on 07/03/2020 ~ pleural fluid culture pending -Pleural fluid consistent with EXUDATE (Lights Criteria: Pleural LDH  979 is > 2/3 UNL of serum LDH (112)  Septic Shock -Continuous cardiac monitoring -Maintain MAP greater than 65 -Vasopressors as needed to maintain MAP goal -Add midodrine -Trend lactic acid until  normalized -Echocardiogram on 07/09/2020 with LVEF 60 to 70%, grade 1 diastolic dysfunction, RV systolic function normal  Sepsis present on admission secondary to pneumonia -Monitor fever curve -Trend WBCs and procalcitonin -Follow cultures as above -Continue empiric Ceftriaxone, Flagyl, vancomycin for now pending cultures and sensitivities ~will discontinue azithromycin & Cefepime today 6/24  Hypotonic Hyponatremia (non-renal), ? SIADH -Monitor I&O's / urinary output -Follow BMP  -Ensure adequate renal perfusion -Avoid nephrotoxic agents as able -Replace electrolytes as indicated -Pharmacy following for assistance with electrolyte replacement -Na corrected to 124 this morning (108 on admission) -Goal rate of Na Correction 6-8 mEq per 24 hrs -IV fluids and Lasix discontinued -Discussed with Dr. Merrilee Jansky ~ Start D5W @ 75 ml/hr -Follow serum Na+ q4h  Acute encephalopathy secondary to sepsis and Hyponatremia Sedation needs in setting of Mechanical Ventilation PMHx of Chronic opiate abuse -Maintain RASS goal of 0 to -1 -Fentanyl and propofol as needed to maintain RASS goal -Avoid sedating meds as able -Daily wake-up assessment ~patient is currently awake and following commands  CT head 07/01/2020 negative for acute intracranial abnormality -Monitor for signs of osmotic demyelination syndrome (pt currently awake and able to follow commands)  Best Practice (right click and "Reselect all SmartList Selections" daily)   Diet/type: NPO, start tube feeds Pain/Anxiety/Delirium protocol RASS goal -1 VAP protocol (if indicated): Yes DVT prophylaxis: prophylactic heparin  GI prophylaxis: PPI Glucose control:  not indicated Central venous access:  yes, and is still indicated Arterial line:  N/A Foley:  yes, and still needed Mobility:  bed rest  PT consulted: N/A Studies pending: N/A Culture data pending:sputum, urine , and blood Last reviewed culture data:today Antibiotics:cefepime,  vanc, and flagyl , Azithromycin Antibiotic de-escalation:  Ceftriaxone, vanc, flagyl Stop date: to be determined  Daily labs: requested Code Status:  full code Last date of multidisciplinary goals of care discussion [] ccm prognosis: Life-threating Disposition: remains critically ill, will stay in intensive care        Labs   CBC: Recent Labs  Lab 06/22/2020 0947 07/03/20 0442  WBC 20.7* 19.1*  NEUTROABS 15.1*  --   HGB 10.6* 9.8*  HCT 29.2* 27.4*  MCV 85.6 86.2  PLT 296 341     Basic Metabolic Panel: Recent Labs  Lab 06/14/2020 0947 06/30/2020 2200 07/03/20 0442 07/03/20 1100  NA 108* 119* 124* 126*  K 4.1 4.3 4.2 3.7  CL 76* 89* 91* 93*  CO2 _0 GLUCOSE 103* 154* 136* 156*  BUN _1 CREATININE 0.97 0.72 0.84 0.76  CALCIUM 8.3* 8.3* 8.5* 8.5*  MG  --   --  1.7  --   PHOS  --   --  5.1*  --     GFR: Estimated Creatinine Clearance: 70.3 mL/min (by C-G formula based on SCr of 0.76 mg/dL). Recent Labs  Lab 06/28/2020 0947 06/20/2020 1325 07/03/20 0442  PROCALCITON  --  0.10  --   WBC 20.7*  --  19.1*  LATICACIDVEN 0.6 1.1  --      Liver Function Tests: Recent Labs  Lab 06/12/2020 0947 06/14/2020 1804 07/03/20 0442  AST 39  --  23  ALT 29  --  23  ALKPHOS 226*  --  184*  BILITOT 0.9  --  0.7  PROT 7.0 6.3* 6.6  ALBUMIN 3.3*  --  3.0*    No results for input(s): LIPASE, AMYLASE in the last 168 hours. No results for input(s): AMMONIA in the last 168 hours.  ABG    Component Value Date/Time   PHART 7.27 (L) 06/30/2020 1115   PCO2ART 47 06/11/2020 1115   PO2ART 85 07/01/2020 1115   HCO3 21.6 06/28/2020 1115   ACIDBASEDEF 5.3 (H) 06/12/2020 1115   O2SAT 94.8 06/10/2020 1115      Coagulation Profile: Recent Labs  Lab 07/06/2020 1325  INR 1.7*    Cardiac Enzymes: No results for input(s): CKTOTAL, CKMB, CKMBINDEX, TROPONINI in the last 168 hours.  HbA1C: Hgb A1c MFr Bld  Date/Time Value Ref Range Status  07/01/2020 01:25 PM  5.8 (H) 4.8 - 5.6 % Final    Comment:    (NOTE)         Prediabetes: 5.7 - 6.4         Diabetes: >6.4         Glycemic control for adults with diabetes: <7.0   03/11/2013 12:00 AM 5.8 4.0 - 6.0 % Final    CBG: Recent Labs  Lab 07/09/2020 1950 06/10/2020 2314 07/03/20 0407 07/03/20 0726 07/03/20 1104  GLUCAP 174* 167* 139* 144* 156*    Review of Systems:   Unable to obtain due to altered mental status/intubation/sedation  Past Medical History:  She,  has a past medical history of Anxiety, Chronic lower back pain, COPD (chronic obstructive pulmonary disease) ([place]), Depression, Hypertension, Panic disorder with agoraphobia and severe panic attacks, and Peripheral sensory neuropathy.   Surgical History:   Past Surgical History:  Procedure Laterality Date   I & D EXTREMITY Right 02/09/2017   Procedure: IRRIGATION  AND DEBRIDEMENT EXTREMITY;  Surgeon: Lovell Sheehan, MD;  Location: [facility] ORS;  Service: Orthopedics;  Laterality: Right;   none       Social History:   reports that she has been smoking cigarettes and e-cigarettes. She has a 30.00 pack-year smoking history. She has never used smokeless tobacco. She reports current drug use. She reports that she does not drink alcohol.   Family History:  Her family history includes Cancer in her father and mother. There is no history of Breast cancer, Ovarian cancer, Colon cancer, Diabetes, or Heart disease.   Allergies Allergies  Allergen Reactions   Penicillins Rash    Has patient had a PCN reaction causing immediate rash, facial/tongue/throat swelling, SOB or lightheadedness with hypotension: Yes Has patient had a PCN reaction causing severe rash involving mucus membranes or skin necrosis: No Has patient had a PCN reaction that required hospitalization: No Has patient had a PCN reaction occurring within the last 10 years: No If all of the above answers are "NO", then may proceed with Cephalosporin use.     Home Medications   Prior to Admission medications   Medication Sig Start Date End Date Taking? Authorizing Provider  albuterol (PROVENTIL HFA;VENTOLIN HFA) 108 (90 Base) MCG/ACT inhaler Inhale 2 puffs into the lungs every 6 (six) hours as needed for wheezing or shortness of breath. 11/16/15   Roselee Nova, MD  alprazolam Duanne Moron) 2 MG tablet Take 1 tablet (2 mg total) by mouth 3 (three) times daily as needed for anxiety. 01/12/17   Roselee Nova, MD  carisoprodol (SOMA) 350 MG tablet Take 1 tablet (350 mg total) by mouth 3 (three) times daily. 01/12/17   Roselee Nova, MD  COMBIVENT RESPIMAT 20-100 MCG/ACT AERS respimat INHALE 1 PUFF INTO THE LUNGS EVERY 6 HOURS AS NEEDED FOR WHEEZING OR SHORTNESS OF BREATH 02/15/17   Rochel Brome A, MD  fluticasone furoate-vilanterol (BREO ELLIPTA) 100-25 MCG/INH AEPB Inhale 1 puff into the lungs daily. 11/26/15   Roselee Nova, MD  ibuprofen (ADVIL,MOTRIN) 200 MG tablet Take 200 mg by mouth every 6 (six) hours as needed for mild pain.     [provider]  potassium chloride (K-DUR) 10 MEQ tablet TAKE 1 TABLET BY MOUTH ONCE A DAY 07/16/15   Roselee Nova, MD  pregabalin (LYRICA) 75 MG capsule Take 1 capsule (75 mg total) by mouth 3 (three) times daily. 01/12/17   Roselee Nova, MD  QUEtiapine (SEROQUEL) 100 MG tablet Take 1 tablet (100 mg total) by mouth at bedtime. 12/13/16   Roselee Nova, MD    Critical care time: 45 minutes   The patient is critically ill with multiple organ system failure and requires high complexity decision making for assessment and support, frequent evaluation and titration of therapies, advanced monitoring, review of radiographic studies and interpretation of complex data.   Darel Hong, AGACNP-BC [facility] Prefer epic messenger for cross cover needs If after hours, please call E-link

## 2020-07-03 NOTE — Consult Note (Signed)
Pharmacy Antibiotic Note  Tamara Mcclure is a 66 y.o. female admitted on 06/14/2020 with HCAP/possible aspiration.  Pleural fluid consistent with exudative process. Pharmacy dosing vancomycin   Plan: Based on current renal function, adjust vancomycin to 750mg  IV q12h AUC = 431 using SCr = 0.8  Goal AUC 400-600 Follow renal function and check levels if remains of vancomycin > 4 days Follow cultures (trach asp and pleural fluid) Also on ceftriaxone and metronidazole  Height: 5\' 5"  (165.1 cm) Weight: 69.3 kg (152 lb 12.5 oz) IBW/kg (Calculated) : 57  Temp (24hrs), Avg:96.9 F (36.1 C), Min:92.3 F (33.5 C), Max:100.04 F (37.8 C)  Recent Labs  Lab 07/01/2020 0947 07/01/2020 1325 06/14/2020 2200 07/03/20 0442 07/03/20 1100  WBC 20.7*  --   --  19.1*  --   CREATININE 0.97  --  0.72 0.84 0.76  LATICACIDVEN 0.6 1.1  --   --   --      Estimated Creatinine Clearance: 70.3 mL/min (by C-G formula based on SCr of 0.76 mg/dL).    Allergies  Allergen Reactions   Penicillins Rash    Has patient had a PCN reaction causing immediate rash, facial/tongue/throat swelling, SOB or lightheadedness with hypotension: Yes Has patient had a PCN reaction causing severe rash involving mucus membranes or skin necrosis: No Has patient had a PCN reaction that required hospitalization: No Has patient had a PCN reaction occurring within the last 10 years: No If all of the above answers are "NO", then may proceed with Cephalosporin use.    Antimicrobials this admission: Vancomycin 6/23 >> Cefepime 6/23 >>6/24 Ceftriaxone 6/24 >> Metronidazole 6/23 >>  Dose adjustments this admission: None  Microbiology results: 6/23 BCx: NGTD 6/23 UCx: pending  6/23 Respiratory from Tracheal Aspirate pending 6/23 pleural fluid: 6/23 MRSA PCR: positive  COVID/FLU NEG  Thank you for allowing pharmacy to be a part of this patient's care.  Doreene Eland, PharmD, BCPS.   Work Cell: [phone] 07/03/2020 2:00  PM

## 2020-07-04 DIAGNOSIS — R6521 Severe sepsis with septic shock: Secondary | ICD-10-CM

## 2020-07-04 DIAGNOSIS — A419 Sepsis, unspecified organism: Secondary | ICD-10-CM

## 2020-07-04 DIAGNOSIS — J9602 Acute respiratory failure with hypercapnia: Secondary | ICD-10-CM

## 2020-07-04 DIAGNOSIS — J151 Pneumonia due to Pseudomonas: Secondary | ICD-10-CM

## 2020-07-04 DIAGNOSIS — J90 Pleural effusion, not elsewhere classified: Secondary | ICD-10-CM

## 2020-07-04 DIAGNOSIS — E871 Hypo-osmolality and hyponatremia: Secondary | ICD-10-CM

## 2020-07-04 LAB — BASIC METABOLIC PANEL
Anion gap: 7 (ref 5–15)
BUN: 11 mg/dL (ref 8–23)
CO2: 25 mmol/L (ref 22–32)
Calcium: 8.4 mg/dL — ABNORMAL LOW (ref 8.9–10.3)
Chloride: 98 mmol/L (ref 98–111)
Creatinine, Ser: 0.61 mg/dL (ref 0.44–1.00)
GFR, Estimated: >60 mL/min (ref >60)
Glucose, Bld: 158 mg/dL — ABNORMAL HIGH (ref 70–99)
Potassium: 3.7 mmol/L (ref 3.5–5.1)
Sodium: 130 mmol/L — ABNORMAL LOW (ref 135–145)

## 2020-07-04 LAB — GLUCOSE, CAPILLARY
Glucose-Capillary: 172 mg/dL — ABNORMAL HIGH (ref 70–99)
Glucose-Capillary: 163 mg/dL — ABNORMAL HIGH (ref 70–99)
Glucose-Capillary: 166 mg/dL — ABNORMAL HIGH (ref 70–99)
Glucose-Capillary: 134 mg/dL — ABNORMAL HIGH (ref 70–99)
Glucose-Capillary: 141 mg/dL — ABNORMAL HIGH (ref 70–99)
Glucose-Capillary: 133 mg/dL — ABNORMAL HIGH (ref 70–99)

## 2020-07-04 LAB — URINE CULTURE: Culture: NO GROWTH

## 2020-07-04 LAB — SODIUM
Sodium: 128 mmol/L — ABNORMAL LOW (ref 135–145)
Sodium: 128 mmol/L — ABNORMAL LOW (ref 135–145)
Sodium: 129 mmol/L — ABNORMAL LOW (ref 135–145)
Sodium: 129 mmol/L — ABNORMAL LOW (ref 135–145)
Sodium: 129 mmol/L — ABNORMAL LOW (ref 135–145)

## 2020-07-04 LAB — TSH: TSH: 0.169 u[IU]/mL — ABNORMAL LOW (ref 0.350–4.500)

## 2020-07-04 LAB — MAGNESIUM: Magnesium: 2 mg/dL (ref 1.7–2.4)

## 2020-07-04 LAB — PHOSPHORUS: Phosphorus: 3.2 mg/dL (ref 2.5–4.6)

## 2020-07-04 MED ORDER — MIDAZOLAM HCL 2 MG/2ML IJ SOLN
2.0000 mg | INTRAMUSCULAR | Status: DC | PRN
  Administered 2020-07-04 – 2020-07-06 (×5): 2 mg via INTRAVENOUS
  Filled 2020-07-04 (×6): qty 2

## 2020-07-04 MED ORDER — SODIUM CHLORIDE 0.9 % IV SOLN
2.0000 g | Freq: Three times a day (TID) | INTRAVENOUS | Status: DC
  Administered 2020-07-04 – 2020-07-09 (×16): 2 g via INTRAVENOUS
  Filled 2020-07-04 (×20): qty 2

## 2020-07-04 MED ORDER — QUETIAPINE FUMARATE 25 MG PO TABS
100.0000 mg | ORAL_TABLET | Freq: Every day | ORAL | Status: DC
  Administered 2020-07-05: 100 mg via ORAL
  Filled 2020-07-04: qty 4

## 2020-07-04 MED ORDER — DEXTROSE 5 % IV SOLN
INTRAVENOUS | Status: DC
  Administered 2020-07-05: 50 mL/h via INTRAVENOUS

## 2020-07-04 MED ORDER — SODIUM CHLORIDE 0.45 % IV SOLN: INTRAVENOUS | Status: DC

## 2020-07-04 MED ORDER — MIDAZOLAM HCL 2 MG/2ML IJ SOLN
INTRAMUSCULAR | Status: AC
  Administered 2020-07-04: 2 mg via INTRAVENOUS
  Filled 2020-07-04: qty 2

## 2020-07-04 NOTE — Consult Note (Signed)
PHARMACY CONSULT NOTE - FOLLOW UP  Pharmacy Consult for Electrolyte Monitoring and Replacement   Recent Labs: Potassium (mmol/L)  Date Value  07/04/2020 3.7  04/12/2014 3.4 (L)   Magnesium (mg/dL)  Date Value  07/04/2020 2.0  04/12/2014 1.4 (L)   Calcium (mg/dL)  Date Value  07/04/2020 8.4 (L)   Calcium, Total (mg/dL)  Date Value  04/12/2014 7.8 (L)   Albumin (g/dL)  Date Value  07/03/2020 3.0 (L)  01/12/2017 4.8  04/12/2014 3.0 (L)   Phosphorus (mg/dL)  Date Value  07/04/2020 3.2   Sodium (mmol/L)  Date Value  07/04/2020 129 (L)  01/12/2017 130 (L)  04/12/2014 136    Assessment: 66 year old with history of COPD, HTN, presenting with AMS and acute on chronic respiratory failure. Recent increase doses of opiate following fall -  failed Narcan and BiPAP in ED now intubated and transferring to CCU. On initial presentation, Pt severely hyponatremic with Na 108. Pharmacy consulted to monitor and replenish electrolytes.   Goal of Therapy:  Electrolytes wnl  Plan:  No electrolyte replacement indicated Continue to follow labs as ordered by team  Tawnya Crook ,PharmD Clinical Pharmacist 07/04/2020 10:50 AM

## 2020-07-04 NOTE — Progress Notes (Signed)
Patient noted bucking the vent given bolus per order. Sedation medications were maxed out and patient still trying to get out of bed. Interventions not effective. Np consulted and ordered versed. Given and patient tolerated well. Now resting in bed with eyes closed. VSS. Will continue to monitor.

## 2020-07-04 NOTE — Progress Notes (Signed)
NAME:  Tamara Mcclure, MRN:  [id number], DOB:  07/24/1955, LOS: 2 ADMISSION DATE:  06/22/2020, CONSULTATION DATE: 06/10/2020 REFERRING MD:  Lindell Noe MD, CHIEF COMPLAINT:   Acute respiratory failure, pneumonia  History of Present Illness:   65 year old with history of COPD, depression, hypertension, panic attacks presenting with altered mental status, acute on chronic respiratory failure.  Patient had a fall a few days ago, seen at local urgent care.  Has been taking increasing doses of opiate.  In the ED she failed Narcan and BiPAP and got intubated.  PCCM consulted for admission  Pertinent  Medical History    has a past medical history of Anxiety, Chronic lower back pain, COPD (chronic obstructive pulmonary disease) ([place]), Depression, Hypertension, Panic disorder with agoraphobia and severe panic attacks, and Peripheral sensory neuropathy.   Micro Data:  07/03/2020: SARS-CoV-2 and influenza PCR>> negative 06/14/2020: HIV>> nonreactive 07/09/2020: Strep pneumo urinary antigen>> negative 07/02/2021: Pleural fluid>> 07/02/2021: Blood culture>> 06/30/2020: MRSA PCR>> positive 06/20/2020: Tracheal aspirate>> gram-positive cocci in pairs, gram-negative positive rods 07/02/2021: Urine>>  Antimicrobials:  Cefepime 6/23>> 6/24 Ceftriaxone 6/24>> Flagyl 6/23>> Vancomycin 6/23>>  3 significant Hospital Events: Including procedures, antibiotic start and stop dates in addition to other pertinent events   6/23-admit, intubated, right chest tube placed, right IJ CVC placed 6/24: Weaning vent settings and Levophed (down to 8 mcg). Na+ corrected to 124 this morning (108 on admission) ~ D5 @ 75 started. Tracheal aspirate with Gram+ cocci in pairs & rare gram + rods ~ Cefepime changed to Ceftriaxone, Azithromycin d/c.  Pleural fluid consistent with EXUDATE ~ pleural fluid culture pending  Interim History / Subjective:  -No acute events noted overnight -Afebrile, weaning down Levophed (currently at 8  mcg) -Na+ corrected to 124 this morning (108 on admission) ~ D5 @ 75 started with q4h Na checks -WBC improved to 19.1 (20.7) -Tracheal aspirate with Gram + cocci in pair, rare gram + rods (Azithromycin d/c)  Objective   Blood pressure 112/72, pulse 82, temperature 97.88 F (36.6 C), resp. rate 13, height 5' 5"  (1.651 m), weight 68.8 kg, SpO2 100 %.    Vent Mode: PRVC FiO2 (%):  [30 %] 30 % Set Rate:  [16 bmp] 16 bmp Vt Set:  [450 mL] 450 mL PEEP:  [5 cmH20] 5 cmH20 Plateau Pressure:  [19 cmH20-22 cmH20] 22 cmH20   Intake/Output Summary (Last 24 hours) at 07/04/2020 1320 Last data filed at 07/04/2020 1300 Gross per 24 hour  Intake 4532.07 ml  Output 3650 ml  Net 882.07 ml    Filed Weights   07/01/2020 1308 07/03/20 0444 07/04/20 0500  Weight: 70.1 kg 69.3 kg 68.8 kg    Examination:  Gen:   Acute on chronically ill-appearing female, sitting in bed, intubated and sedated, no acute distress HEENT: Atraumatic, normocephalic, EOMI, sclera anicteric Neck:     No masses; no thyromegaly, no JVD Lungs:    Coarse breath sounds throughout with expiratory wheezing to the left, synchronous with the vent, even CV:         Regular rate and rhythm; 1 S2, no M/R/G Abd:      + bowel sounds; soft, non-tender; no palpable masses, no distension Ext:    No edema; adequate peripheral perfusion Skin:      Warm and dry; no obvious rashes, lesions, ulcerations Neuro: Lightly sedated, moves all extremities to command and follows commands, intermittent periods of restlessness/agitation pupils PERRLA Psych: Unable to assess due to intubation    Resolved Hospital Problem  list     Assessment & Plan:   RESP: Acute on chronic respiratory failure due to COPD exacerbation right lower lobe pneumonia, & large Right Pleural Effusion  6/24      6/23 -Full vent support, implement lung protective strategies -Wean FiO2 and PEEP as tolerated to maintain O2 sats 88 to 92% -CXR shows significant reduction in  RIGHT sided pleural effusion after CT placement -Spontaneous breathing trials when respiratory parameters met and mental status permits -Implement VAP bundle -Bronchodilators and IV steroids -Continue antibiotics, rare pseudomonas on Tcx -Change Rocephin to Cefepime for now -Status post chest tube placement on 07/05/2020 ~ pleural fluid culture no growth thus far -Pleural fluid consistent with EXUDATE (Lights Criteria: Pleural LDH  979 is > 2/3 UNL of serum LDH (112) -Consider CT chest to follow up on effusion, perhaps Dornase/TPA if appears stranding or any locules   CV: Septic Shock -Continuous cardiac monitoring -Maintain MAP greater than 65 -Vasopressors as needed to maintain MAP goal -Continue midodrine -Lactic acid normalized -Echocardiogram on 06/20/2020 with LVEF 60 to 16%, grade 1 diastolic dysfunction, RV systolic function normal  ID: Sepsis present on admission secondary to pneumonia -Monitor fever curve -Trend WBCs 19.1 and procalcitonin 0.10 -Follow cultures as above -Maintain Flagyl, vancomycin for now pending cultures and sensitivities, resume Cefepime  RENAL: Lab Results  Component Value Date   CREATININE 0.61 07/04/2020   BUN 11 07/04/2020   NA 129 (L) 07/04/2020   K 3.7 07/04/2020   CL 98 07/04/2020   CO2 25 07/04/2020   Intake/Output Summary (Last 24 hours) at 07/04/2020 1333 Last data filed at 07/04/2020 1300 Gross per 24 hour  Intake 4532.07 ml  Output 3650 ml  Net 882.07 ml   Net IO Since Admission: -1,834.42 mL [07/04/20 1333]  Hypotonic Hyponatremia (non-renal) -Monitor I&O's / urinary output -Follow BMP  -Ensure adequate renal perfusion -Avoid nephrotoxic agents as able -Replace electrolytes as indicated -Pharmacy following for assistance with electrolyte replacement -Na corrected to 124 yesterday (108 on admission) now 129 -Goal rate of Na Correction 6-8 mEq per 24 hrs -IV fluids and Lasix discontinued for now -Reduce D5W to  50cc/hr -Follow serum Na+ q12  Acute encephalopathy secondary to sepsis and Hyponatremia Sedation needs in setting of Mechanical Ventilation PMHx of Chronic opiate abuse -Maintain RASS goal of 0 to -1 -Fentanyl and propofol as needed to maintain RASS goal -Avoid sedating meds as able -Daily wake-up assessment ~patient is currently awake and following commands  CT head 07/04/2020 negative for acute intracranial abnormality -Monitor for signs of osmotic demyelination syndrome (pt currently awake and able to follow commands)  Best Practice (right click and "Reselect all SmartList Selections" daily)   Diet/type: NPO, start tube feeds Pain/Anxiety/Delirium protocol RASS goal -1 VAP protocol (if indicated): Yes DVT prophylaxis: prophylactic heparin  GI prophylaxis: PPI Glucose control:  not indicated Central venous access:  yes, and is still indicated Arterial line:  N/A Foley:  yes, and still needed Mobility:  bed rest  PT consulted: N/A Studies pending: N/A Culture data pending:sputum, urine , and blood Last reviewed culture data:today Antibiotics:cefepime, vanc, and flagyl , Azithromycin Antibiotic de-escalation:  Ceftriaxone, vanc, flagyl Stop date: to be determined  Daily labs: requested Code Status:  full code Last date of multidisciplinary goals of care discussion []  ccm prognosis: Life-threating Disposition: remains critically ill, will stay in intensive care  Labs   CBC: Recent Labs  Lab 06/16/2020 0947 07/03/20 0442  WBC 20.7* 19.1*  NEUTROABS 15.1*  --  HGB 10.6* 9.8*  HCT 29.2* 27.4*  MCV 85.6 86.2  PLT 296 341     Basic Metabolic Panel: Recent Labs  Lab 06/17/2020 0947 07/03/2020 2200 07/03/20 0442 07/03/20 1100 07/03/20 1522 07/03/20 2019 07/03/20 2356 07/04/20 0544 07/04/20 0957  NA 108* 119* 124* 126* 125* 125* 128* 130* 129*  K 4.1 4.3 4.2 3.7  --   --   --  3.7  --   CL 76* 89* 91* 93*  --   --   --  98  --   CO2 22 23 24 23   --   --   --  25   --   GLUCOSE 103* 154* 136* 156*  --   --   --  158*  --   BUN 13 12 15 13   --   --   --  11  --   CREATININE 0.97 0.72 0.84 0.76  --   --   --  0.61  --   CALCIUM 8.3* 8.3* 8.5* 8.5*  --   --   --  8.4*  --   MG  --   --  1.7  --   --   --   --  2.0  --   PHOS  --   --  5.1*  --   --   --   --  3.2  --     GFR: Estimated Creatinine Clearance: 70.1 mL/min (by C-G formula based on SCr of 0.61 mg/dL). Recent Labs  Lab 07/08/2020 0947 06/10/2020 1325 07/03/20 0442  PROCALCITON  --  0.10  --   WBC 20.7*  --  19.1*  LATICACIDVEN 0.6 1.1  --      Liver Function Tests: Recent Labs  Lab 06/22/2020 0947 07/01/2020 1804 07/03/20 0442  AST 39  --  23  ALT 29  --  23  ALKPHOS 226*  --  184*  BILITOT 0.9  --  0.7  PROT 7.0 6.3* 6.6  ALBUMIN 3.3*  --  3.0*    No results for input(s): LIPASE, AMYLASE in the last 168 hours. No results for input(s): AMMONIA in the last 168 hours.  ABG    Component Value Date/Time   PHART 7.27 (L) 06/20/2020 1115   PCO2ART 47 07/05/2020 1115   PO2ART 85 06/30/2020 1115   HCO3 21.6 07/09/2020 1115   ACIDBASEDEF 5.3 (H) 06/29/2020 1115   O2SAT 94.8 06/14/2020 1115      Coagulation Profile: Recent Labs  Lab 06/19/2020 1325  INR 1.7*     Cardiac Enzymes: No results for input(s): CKTOTAL, CKMB, CKMBINDEX, TROPONINI in the last 168 hours.  HbA1C: Hgb A1c MFr Bld  Date/Time Value Ref Range Status  06/25/2020 01:25 PM 5.8 (H) 4.8 - 5.6 % Final    Comment:    (NOTE)         Prediabetes: 5.7 - 6.4         Diabetes: >6.4         Glycemic control for adults with diabetes: <7.0   03/11/2013 12:00 AM 5.8 4.0 - 6.0 % Final    CBG: Recent Labs  Lab 07/03/20 1937 07/03/20 2315 07/04/20 0321 07/04/20 0734 07/04/20 1128  GLUCAP 158* 154* 172* 163* 166*     Review of Systems:   Unable to obtain due to altered mental status/intubation/sedation  Past Medical History:  She,  has a past medical history of Anxiety, Chronic lower back pain, COPD  (chronic obstructive pulmonary disease) ([place]), Depression, Hypertension,  Panic disorder with agoraphobia and severe panic attacks, and Peripheral sensory neuropathy.   Surgical History:   Past Surgical History:  Procedure Laterality Date   I & D EXTREMITY Right 02/09/2017   Procedure: IRRIGATION AND DEBRIDEMENT EXTREMITY;  Surgeon: Lovell Sheehan, MD;  Location: [facility] ORS;  Service: Orthopedics;  Laterality: Right;   none       Social History:   reports that she has been smoking cigarettes and e-cigarettes. She has a 30.00 pack-year smoking history. She has never used smokeless tobacco. She reports current drug use. She reports that she does not drink alcohol.   Family History:  Her family history includes Cancer in her father and mother. There is no history of Breast cancer, Ovarian cancer, Colon cancer, Diabetes, or Heart disease.   Allergies Allergies  Allergen Reactions   Penicillins Rash    Has patient had a PCN reaction causing immediate rash, facial/tongue/throat swelling, SOB or lightheadedness with hypotension: Yes Has patient had a PCN reaction causing severe rash involving mucus membranes or skin necrosis: No Has patient had a PCN reaction that required hospitalization: No Has patient had a PCN reaction occurring within the last 10 years: No If all of the above answers are "NO", then may proceed with Cephalosporin use.     Home Medications  Prior to Admission medications   Medication Sig Start Date End Date Taking? Authorizing Provider  albuterol (PROVENTIL HFA;VENTOLIN HFA) 108 (90 Base) MCG/ACT inhaler Inhale 2 puffs into the lungs every 6 (six) hours as needed for wheezing or shortness of breath. 11/16/15   Roselee Nova, MD  alprazolam Duanne Moron) 2 MG tablet Take 1 tablet (2 mg total) by mouth 3 (three) times daily as needed for anxiety. 01/12/17   Roselee Nova, MD  carisoprodol (SOMA) 350 MG tablet Take 1 tablet (350 mg total) by mouth 3 (three) times daily. 01/12/17    Roselee Nova, MD  COMBIVENT RESPIMAT 20-100 MCG/ACT AERS respimat INHALE 1 PUFF INTO THE LUNGS EVERY 6 HOURS AS NEEDED FOR WHEEZING OR SHORTNESS OF BREATH 02/15/17   Rochel Brome A, MD  fluticasone furoate-vilanterol (BREO ELLIPTA) 100-25 MCG/INH AEPB Inhale 1 puff into the lungs daily. 11/26/15   Roselee Nova, MD  ibuprofen (ADVIL,MOTRIN) 200 MG tablet Take 200 mg by mouth every 6 (six) hours as needed for mild pain.     [provider]  potassium chloride (K-DUR) 10 MEQ tablet TAKE 1 TABLET BY MOUTH ONCE A DAY 07/16/15   Roselee Nova, MD  pregabalin (LYRICA) 75 MG capsule Take 1 capsule (75 mg total) by mouth 3 (three) times daily. 01/12/17   Roselee Nova, MD  QUEtiapine (SEROQUEL) 100 MG tablet Take 1 tablet (100 mg total) by mouth at bedtime. 12/13/16   Roselee Nova, MD    Critical care time: 45 minutes   Critical Care Time devoted to patient care services described in this note is 45 minutes.  Overall, patient is critically ill, prognosis is guarded.   Patient with Multiorgan failure and at high risk for cardiac arrest and death.  Images reviewed directly and interpretation in A&P is my own unless noted Labs reviewed and evaluated as noted in A/P

## 2020-07-05 ENCOUNTER — Inpatient Hospital Stay: Payer: Medicare Other

## 2020-07-05 DIAGNOSIS — J189 Pneumonia, unspecified organism: Secondary | ICD-10-CM

## 2020-07-05 DIAGNOSIS — Z4682 Encounter for fitting and adjustment of non-vascular catheter: Secondary | ICD-10-CM

## 2020-07-05 LAB — BASIC METABOLIC PANEL
Anion gap: 7 (ref 5–15)
BUN: 16 mg/dL (ref 8–23)
CO2: 26 mmol/L (ref 22–32)
Calcium: 8.3 mg/dL — ABNORMAL LOW (ref 8.9–10.3)
Chloride: 100 mmol/L (ref 98–111)
Creatinine, Ser: 0.61 mg/dL (ref 0.44–1.00)
GFR, Estimated: >60 mL/min (ref >60)
Glucose, Bld: 162 mg/dL — ABNORMAL HIGH (ref 70–99)
Potassium: 4.2 mmol/L (ref 3.5–5.1)
Sodium: 133 mmol/L — ABNORMAL LOW (ref 135–145)

## 2020-07-05 LAB — PROTEIN, BODY FLUID (OTHER): Total Protein, Body Fluid Other: 4.6 g/dL

## 2020-07-05 LAB — CBC
HCT: 28 % — ABNORMAL LOW (ref 36.0–46.0)
Hemoglobin: 9.6 g/dL — ABNORMAL LOW (ref 12.0–15.0)
MCH: 31 pg (ref 26.0–34.0)
MCHC: 34.3 g/dL (ref 30.0–36.0)
MCV: 90.3 fL (ref 80.0–100.0)
Platelets: 326 10*3/uL (ref 150–400)
RBC: 3.1 MIL/uL — ABNORMAL LOW (ref 3.87–5.11)
RDW: 13.9 % (ref 11.5–15.5)
WBC: 17.2 10*3/uL — ABNORMAL HIGH (ref 4.0–10.5)
nRBC: 0 % (ref 0.0–0.2)

## 2020-07-05 LAB — PH, BODY FLUID: pH, Body Fluid: 7.3

## 2020-07-05 LAB — GLUCOSE, CAPILLARY
Glucose-Capillary: 136 mg/dL — ABNORMAL HIGH (ref 70–99)
Glucose-Capillary: 150 mg/dL — ABNORMAL HIGH (ref 70–99)
Glucose-Capillary: 119 mg/dL — ABNORMAL HIGH (ref 70–99)
Glucose-Capillary: 138 mg/dL — ABNORMAL HIGH (ref 70–99)
Glucose-Capillary: 136 mg/dL — ABNORMAL HIGH (ref 70–99)

## 2020-07-05 LAB — SODIUM
Sodium: 132 mmol/L — ABNORMAL LOW (ref 135–145)
Sodium: 134 mmol/L — ABNORMAL LOW (ref 135–145)
Sodium: 133 mmol/L — ABNORMAL LOW (ref 135–145)
Sodium: 134 mmol/L — ABNORMAL LOW (ref 135–145)

## 2020-07-05 LAB — MAGNESIUM: Magnesium: 2 mg/dL (ref 1.7–2.4)

## 2020-07-05 LAB — PHOSPHORUS: Phosphorus: 3.1 mg/dL (ref 2.5–4.6)

## 2020-07-05 MED ORDER — QUETIAPINE FUMARATE 25 MG PO TABS
100.0000 mg | ORAL_TABLET | Freq: Every day | ORAL | Status: DC
  Administered 2020-07-05 – 2020-07-07 (×3): 100 mg
  Filled 2020-07-05 (×3): qty 4

## 2020-07-05 MED ORDER — POLYETHYLENE GLYCOL 3350 17 G PO PACK
17.0000 g | PACK | Freq: Every day | ORAL | Status: DC | PRN
  Administered 2020-07-06: 17 g
  Filled 2020-07-05: qty 1

## 2020-07-05 MED ORDER — DOCUSATE SODIUM 50 MG/5ML PO LIQD
100.0000 mg | Freq: Two times a day (BID) | ORAL | Status: DC | PRN
  Administered 2020-07-06: 100 mg
  Filled 2020-07-05: qty 10

## 2020-07-05 NOTE — Progress Notes (Signed)
Stable day. Woke up and followed commands during her SBT. Chest xray still not clear enough yet to extubate. Son called x 2 to check on mom.Remains in NSR. Ventilator setting weaned to 30% FIO2 and Peep of 5. Hop[e to extubate tomorrow. Chest tube drained 75 mls of serosanguinous fluid.

## 2020-07-05 NOTE — Progress Notes (Signed)
NAME:  VARSHA KNOCK, MRN:  [id number], DOB:  1955/04/09, LOS: 3 ADMISSION DATE:  07/03/2020, CONSULTATION DATE: 07/08/2020 REFERRING MD:  Lindell Noe MD, CHIEF COMPLAINT:   Acute respiratory failure, pneumonia  History of Present Illness:   65 year old with history of COPD, depression, hypertension, panic attacks presenting with altered mental status, acute on chronic respiratory failure.  Patient had a fall a few days ago, seen at local urgent care.  Has been taking increasing doses of opiate.  In the ED she failed Narcan and BiPAP and got intubated.  PCCM consulted for admission  Pertinent  Medical History    has a past medical history of Anxiety, Chronic lower back pain, COPD (chronic obstructive pulmonary disease) ([place]), Depression, Hypertension, Panic disorder with agoraphobia and severe panic attacks, and Peripheral sensory neuropathy.   Micro Data:  06/19/2020: SARS-CoV-2 and influenza PCR>> negative 07/04/2020: HIV>> nonreactive 07/09/2020: Strep pneumo urinary antigen>> negative 07/02/2021: Pleural fluid>> 07/02/2021: Blood culture>> 07/08/2020: MRSA PCR>> positive 06/25/2020: Tracheal aspirate>> gram-positive cocci in pairs, gram-negative positive rods 07/02/2021: Urine>>  Antimicrobials:  Cefepime 6/23>> 6/24 Ceftriaxone 6/24>> Flagyl 6/23>> Vancomycin 6/23>>  3 significant Hospital Events: Including procedures, antibiotic start and stop dates in addition to other pertinent events   6/23-admit, intubated, right chest tube placed, right IJ CVC placed 6/24: Weaning vent settings and Levophed (down to 8 mcg). Na+ corrected to 124 this morning (108 on admission) ~ D5 @ 75 started. Tracheal aspirate with Gram+ cocci in pairs & rare gram + rods ~ Cefepime changed to Ceftriaxone, Azithromycin d/c.  Pleural fluid consistent with EXUDATE ~ pleural fluid culture pending 6/26 remains on vent, critically ill  Interim History / Subjective:  Remains on vent Critically ill Severe  hypoxia  -Tracheal aspirate with Gram + cocci in pair, rare gram + rods   Objective   Blood pressure 100/64, pulse 92, temperature 98.96 F (37.2 C), temperature source Bladder, resp. rate 16, height 5' 5"  (1.651 m), weight 72.1 kg, SpO2 100 %.    Vent Mode: PRVC FiO2 (%):  [30 %-55 %] 55 % Set Rate:  [16 bmp] 16 bmp Vt Set:  [450 mL] 450 mL PEEP:  [5 cmH20-8 cmH20] 8 cmH20 Plateau Pressure:  [18 cmH20-26 cmH20] 18 cmH20   Intake/Output Summary (Last 24 hours) at 07/05/2020 0752 Last data filed at 07/05/2020 0600 Gross per 24 hour  Intake 4597.73 ml  Output 2310 ml  Net 2287.73 ml    Filed Weights   07/03/20 0444 07/04/20 0500 07/05/20 0433  Weight: 69.3 kg 68.8 kg 72.1 kg    REVIEW OF SYSTEMS  PATIENT IS UNABLE TO PROVIDE COMPLETE REVIEW OF SYSTEMS DUE TO SEVERE CRITICAL ILLNESS AND TOXIC METABOLIC ENCEPHALOPATHY   PHYSICAL EXAMINATION:  GENERAL:critically ill appearing, +resp distress HEAD: Normocephalic, atraumatic.  EYES: Pupils equal, round, reactive to light.  No scleral icterus.  MOUTH: Moist mucosal membrane. NECK: Supple. No thyromegaly. No nodules. No JVD.  PULMONARY: +rhonchi, +wheezing CARDIOVASCULAR: S1 and S2. Regular rate and rhythm. No murmurs, rubs, or gallops.  GASTROINTESTINAL: Soft, nontender, -distended. Positive bowel sounds.  MUSCULOSKELETAL: No swelling, clubbing, or edema.  NEUROLOGIC: obtunded SKIN:intact,warm,dry     Assessment & Plan:   RESP: Acute on chronic respiratory failure due to COPD exacerbation right lower lobe pneumonia, & large Right Pleural Effusion s/p chest tube placement  Severe ACUTE Hypoxic and Hypercapnic Respiratory Failure -continue Mechanical Ventilator support -continue Bronchodilator Therapy -Wean Fio2 and PEEP as tolerated -VAP/VENT bundle implementation -will perform SAT/SBT when respiratory parameters are  met  Septic shock -use vasopressors to keep MAP>65 -follow ABG and LA -follow up  cultures -emperic ABX -consider stress dose steroids -aggressive IV fluid Resuscitation -Echocardiogram on 06/22/2020 with LVEF 60 to 28%, grade 1 diastolic dysfunction, RV systolic function normal   INFECTIOUS DISEASE -continue antibiotics as prescribed -follow up cultures Sepsis present on admission secondary to pneumonia    Hypotonic Hyponatremia (non-renal) -Monitor I&O's / urinary output -Na corrected to 124 yesterday (108 on admission) now 133 CMP Latest Ref Rng & Units 07/05/2020 07/05/2020 07/04/2020  Glucose 70 - 99 mg/dL 162(H) - -  BUN 8 - 23 mg/dL 16 - -  Creatinine 0.44 - 1.00 mg/dL 0.61 - -  Sodium 135 - 145 mmol/L 133(L) 132(L) 128(L)  Potassium 3.5 - 5.1 mmol/L 4.2 - -  Chloride 98 - 111 mmol/L 100 - -  CO2 22 - 32 mmol/L 26 - -  Calcium 8.9 - 10.3 mg/dL 8.3(L) - -  Total Protein 6.5 - 8.1 g/dL - - -  Total Bilirubin 0.3 - 1.2 mg/dL - - -  Alkaline Phos 38 - 126 U/L - - -  AST 15 - 41 U/L - - -  ALT 0 - 44 U/L - - -     NEUROLOGY ACUTE TOXIC METABOLIC ENCEPHALOPATHY -need for sedation -Goal RASS -2 to -3 Acute encephalopathy secondary to sepsis and Hyponatremia Sedation needs in setting of Mechanical Ventilation PMHx of Chronic opiate abuse  Best Practice (right click and "Reselect all SmartList Selections" daily)   Diet/type: NPO, start tube feeds Pain/Anxiety/Delirium protocol RASS goal -1 VAP protocol (if indicated): Yes DVT prophylaxis: prophylactic heparin  GI prophylaxis: PPI Glucose control:  not indicated Central venous access:  yes, and is still indicated Arterial line:  N/A Foley:  yes, and still needed Mobility:  bed rest  PT consulted: N/A Studies pending: N/A Culture data pending:sputum, urine , and blood Last reviewed culture data:today Antibiotics:cefepime, vanc, and flagyl , Azithromycin Antibiotic de-escalation:  Ceftriaxone, vanc, flagyl Stop date: to be determined  Daily labs: requested Code Status:  full code Last date of  multidisciplinary goals of care discussion []  ccm prognosis: Life-threating Disposition: remains critically ill, will stay in intensive care  Labs   CBC: Recent Labs  Lab 06/12/2020 0947 07/03/20 0442 07/05/20 0520  WBC 20.7* 19.1* 17.2*  NEUTROABS 15.1*  --   --   HGB 10.6* 9.8* 9.6*  HCT 29.2* 27.4* 28.0*  MCV 85.6 86.2 90.3  PLT 296 341 326       DVT/GI PRX  assessed I Assessed the need for Labs I Assessed the need for Foley I Assessed the need for Central Venous Line Family Discussion when available I Assessed the need for Mobilization I made an Assessment of medications to be adjusted accordingly Safety Risk assessment completed  CASE DISCUSSED IN MULTIDISCIPLINARY ROUNDS WITH ICU TEAM     Critical Care Time devoted to patient care services described in this note is 45 minutes.  Critical care was necessary to treat /prevent imminent and life-threatening deterioration. Overall, patient is critically ill, prognosis is guarded.  Patient with Multiorgan failure and at high risk for cardiac arrest and death.    Corrin Parker, M.D.  [facility]  Medical Director [place] Director [facility] Cardio-Pulmonary Department

## 2020-07-05 NOTE — Consult Note (Signed)
PHARMACY CONSULT NOTE - FOLLOW UP  Pharmacy Consult for Electrolyte Monitoring and Replacement   Recent Labs: Potassium (mmol/L)  Date Value  07/05/2020 4.2  04/12/2014 3.4 (L)   Magnesium (mg/dL)  Date Value  07/05/2020 2.0  04/12/2014 1.4 (L)   Calcium (mg/dL)  Date Value  07/05/2020 8.3 (L)   Calcium, Total (mg/dL)  Date Value  04/12/2014 7.8 (L)   Albumin (g/dL)  Date Value  07/03/2020 3.0 (L)  01/12/2017 4.8  04/12/2014 3.0 (L)   Phosphorus (mg/dL)  Date Value  07/05/2020 3.1   Sodium (mmol/L)  Date Value  07/05/2020 133 (L)  01/12/2017 130 (L)  04/12/2014 136    Assessment: 65 year old with history of COPD, HTN, presenting with AMS and acute on chronic respiratory failure. Recent increase doses of opiate following fall -  failed Narcan and BiPAP in ED now intubated and transferring to CCU. On initial presentation, Pt severely hyponatremic with Na 108. Pharmacy consulted to monitor and replenish electrolytes.   Goal of Therapy:  Electrolytes wnl  Plan:  No electrolyte replacement indicated Continue to follow labs as ordered by team  Tawnya Crook ,PharmD Clinical Pharmacist 07/05/2020 7:59 AM

## 2020-07-06 ENCOUNTER — Inpatient Hospital Stay: Payer: Medicare Other

## 2020-07-06 DIAGNOSIS — G9340 Encephalopathy, unspecified: Secondary | ICD-10-CM

## 2020-07-06 LAB — GLUCOSE, CAPILLARY
Glucose-Capillary: 113 mg/dL — ABNORMAL HIGH (ref 70–99)
Glucose-Capillary: 114 mg/dL — ABNORMAL HIGH (ref 70–99)
Glucose-Capillary: 127 mg/dL — ABNORMAL HIGH (ref 70–99)
Glucose-Capillary: 137 mg/dL — ABNORMAL HIGH (ref 70–99)

## 2020-07-06 LAB — CULTURE, RESPIRATORY W GRAM STAIN

## 2020-07-06 LAB — BASIC METABOLIC PANEL
Anion gap: 5 (ref 5–15)
BUN: 22 mg/dL (ref 8–23)
CO2: 27 mmol/L (ref 22–32)
Calcium: 8 mg/dL — ABNORMAL LOW (ref 8.9–10.3)
Chloride: 103 mmol/L (ref 98–111)
Creatinine, Ser: 0.55 mg/dL (ref 0.44–1.00)
GFR, Estimated: >60 mL/min (ref >60)
Glucose, Bld: 128 mg/dL — ABNORMAL HIGH (ref 70–99)
Potassium: 3.8 mmol/L (ref 3.5–5.1)
Sodium: 135 mmol/L (ref 135–145)

## 2020-07-06 LAB — BODY FLUID CULTURE W GRAM STAIN: Culture: NO GROWTH

## 2020-07-06 LAB — SODIUM
Sodium: 133 mmol/L — ABNORMAL LOW (ref 135–145)
Sodium: 135 mmol/L (ref 135–145)

## 2020-07-06 LAB — MAGNESIUM: Magnesium: 1.8 mg/dL (ref 1.7–2.4)

## 2020-07-06 LAB — TRIGLYCERIDES: Triglycerides: 70 mg/dL (ref <150)

## 2020-07-06 LAB — CYTOLOGY - NON PAP

## 2020-07-06 LAB — PHOSPHORUS: Phosphorus: 2.2 mg/dL — ABNORMAL LOW (ref 2.5–4.6)

## 2020-07-06 MED ORDER — LABETALOL HCL 5 MG/ML IV SOLN
10.0000 mg | INTRAVENOUS | Status: DC | PRN
  Administered 2020-07-06 – 2020-07-08 (×6): 10 mg via INTRAVENOUS
  Filled 2020-07-06 (×5): qty 4

## 2020-07-06 MED ORDER — MAGNESIUM SULFATE 2 GM/50ML IV SOLN
2.0000 g | Freq: Once | INTRAVENOUS | Status: AC
  Administered 2020-07-06: 2 g via INTRAVENOUS
  Filled 2020-07-06: qty 50

## 2020-07-06 MED ORDER — ALPRAZOLAM 0.5 MG PO TABS: 2.0000 mg | ORAL_TABLET | Freq: Three times a day (TID) | ORAL | Status: DC | PRN

## 2020-07-06 MED ORDER — POTASSIUM & SODIUM PHOSPHATES 280-160-250 MG PO PACK
2.0000 | PACK | Freq: Once | ORAL | Status: AC
  Administered 2020-07-06: 2 via ORAL
  Filled 2020-07-06: qty 2

## 2020-07-06 MED ORDER — METHYLPREDNISOLONE SODIUM SUCC 40 MG IJ SOLR
20.0000 mg | Freq: Two times a day (BID) | INTRAMUSCULAR | Status: DC
  Administered 2020-07-06 – 2020-07-08 (×4): 20 mg via INTRAVENOUS
  Filled 2020-07-06 (×4): qty 1

## 2020-07-06 MED ORDER — LINEZOLID 600 MG PO TABS
600.0000 mg | ORAL_TABLET | Freq: Two times a day (BID) | ORAL | Status: DC
  Filled 2020-07-06 (×3): qty 1

## 2020-07-06 MED ORDER — ALPRAZOLAM 0.5 MG PO TABS
1.0000 mg | ORAL_TABLET | Freq: Three times a day (TID) | ORAL | Status: DC | PRN
  Administered 2020-07-06: 1 mg
  Filled 2020-07-06: qty 2

## 2020-07-06 MED ORDER — SENNA 8.6 MG PO TABS
2.0000 | ORAL_TABLET | Freq: Every day | ORAL | Status: DC
  Administered 2020-07-09 – 2020-07-14 (×6): 17.2 mg via ORAL
  Filled 2020-07-06 (×6): qty 2

## 2020-07-06 MED ORDER — DOCUSATE SODIUM 50 MG/5ML PO LIQD: 100.0000 mg | Freq: Two times a day (BID) | ORAL | Status: DC | PRN

## 2020-07-06 MED ORDER — ALPRAZOLAM 0.5 MG PO TABS
2.0000 mg | ORAL_TABLET | Freq: Three times a day (TID) | ORAL | Status: DC | PRN
  Administered 2020-07-06: 2 mg via ORAL
  Filled 2020-07-06: qty 4

## 2020-07-06 MED ORDER — POLYETHYLENE GLYCOL 3350 17 G PO PACK
17.0000 g | PACK | Freq: Every day | ORAL | Status: DC
  Administered 2020-07-10: 17 g via ORAL
  Filled 2020-07-06 (×2): qty 1

## 2020-07-06 MED ORDER — LABETALOL HCL 5 MG/ML IV SOLN
INTRAVENOUS | Status: AC
  Filled 2020-07-06: qty 4

## 2020-07-06 MED ORDER — LABETALOL HCL 5 MG/ML IV SOLN
20.0000 mg | Freq: Once | INTRAVENOUS | Status: AC
  Administered 2020-07-06: 20 mg via INTRAVENOUS

## 2020-07-06 NOTE — Progress Notes (Signed)
Patient extubated per MD order to 4L [place] with no complications.

## 2020-07-06 NOTE — Consult Note (Addendum)
PHARMACY CONSULT NOTE - FOLLOW UP  Pharmacy Consult for Electrolyte Monitoring and Replacement   Recent Labs: Potassium (mmol/L)  Date Value  07/06/2020 3.8  04/12/2014 3.4 (L)   Magnesium (mg/dL)  Date Value  07/06/2020 1.8  04/12/2014 1.4 (L)   Calcium (mg/dL)  Date Value  07/06/2020 8.0 (L)   Calcium, Total (mg/dL)  Date Value  04/12/2014 7.8 (L)   Albumin (g/dL)  Date Value  07/03/2020 3.0 (L)  01/12/2017 4.8  04/12/2014 3.0 (L)   Phosphorus (mg/dL)  Date Value  07/06/2020 2.2 (L)   Sodium (mmol/L)  Date Value  07/06/2020 135  01/12/2017 130 (L)  04/12/2014 136    Assessment: 65 year old with history of COPD, HTN, presenting with AMS and acute on chronic respiratory failure. Pt was intubated on 6/23 and now extubated on 6/27   Pharmacy consulted to monitor and replenish electrolytes while in CCU.  Scr 0.55 mg/dL. Renal functions are stable at apparent baseline.  Corrected Calcium: 8.8 mg/dL   Nutrition:  Vital High @ 45 mL/hr  PROSource TF 45 mL daily   Free Water 30 mL q4h   Goal of Therapy:  Electrolytes wnl  Plan: 2 g IV Mag Sulfate Pho-Nak 280-160-250 packet: 2 packets ( each package contains 250 mg elemental phosphorus)   FU monitor electrolytes in the morning   Tamara Mcclure

## 2020-07-06 NOTE — Progress Notes (Signed)
0730 Sedation turned off per sedation vacation. Patient awake and trying to breathe on her own. Following directions and lifting her head off the pillow when asked. Placed in spontaneous mode per ventilator.At first heart rate and B/P increased greatly. After asking patient questions,patient givek her normal dose of Xanax 2mg . Coached patient to calm down and relax. Patient aware and following directions. Patient decreased her heart rate and B/P as she calmed herself with the help of Xanax. Progressing well with her ventilator weaning.

## 2020-07-06 NOTE — Progress Notes (Addendum)
Extubated this am. Very sleepy but waking up all day.Coughing up thick white sputum. B/P elevated off and on all day. Treated this pm for elevated B/P -Dr. Mortimer Fries aware. More alert as the day progressed. Has remained on nasal cannula. Son concerned about  mothers care by [facility] MD. Discussed going to MD with mom. Patient appears to act not quite right. Hopefully this is medication related. Will re assess tomorrow.

## 2020-07-06 NOTE — Progress Notes (Signed)
GOALS OF CARE DISCUSSION  The Clinical status was relayed to family in detail. Son Mr Edell Updated Updated and notified of patients medical condition.  Explained to family course of therapy and the modalities  Plan for trail of extubation   Patient with Progressive multiorgan failure with a very high probablity of a very minimal chance of meaningful recovery despite all aggressive and optimal medical therapy.  PATIENT REMAINS FULL CODE  Family understands the situation.    Family are satisfied with Plan of action and management. All questions answered  Additional CC time 35 mins   Kharis Lapenna Patricia Pesa, M.D.  [facility]  Medical Director [place] Director [facility] Cardio-Pulmonary Department

## 2020-07-06 NOTE — Progress Notes (Signed)
NAME:  SENTA KANTOR, MRN:  [id number], DOB:  [birth year], LOS: 4 ADMISSION DATE:  06/22/2020, CONSULTATION DATE: 07/03/2020 REFERRING MD:  Lindell Noe MD, CHIEF COMPLAINT:   Acute respiratory failure, pneumonia  History of Present Illness:   65 year old with history of COPD, depression, hypertension, panic attacks presenting with altered mental status, acute on chronic respiratory failure.  Patient had a fall a few days ago, seen at local urgent care.  Has been taking increasing doses of opiate.  In the ED she failed Narcan and BiPAP and got intubated.  PCCM consulted for admission  Pertinent  Medical History    has a past medical history of Anxiety, Chronic lower back pain, COPD (chronic obstructive pulmonary disease) ([place]), Depression, Hypertension, Panic disorder with agoraphobia and severe panic attacks, and Peripheral sensory neuropathy.   Micro Data:  06/22/2020: SARS-CoV-2 and influenza PCR>> negative 07/01/2020: HIV>> nonreactive 07/03/2020: Strep pneumo urinary antigen>> negative 07/02/2021: Pleural fluid>> 07/02/2021: Blood culture>> 06/25/2020: MRSA PCR>> positive 06/29/2020: Tracheal aspirate>> gram-positive cocci in pairs, gram-negative positive rods 07/02/2021: Urine>>  Antimicrobials:  Cefepime 6/23>> 6/24 Ceftriaxone 6/24>> Flagyl 6/23>> Vancomycin 6/23>>  3 significant Hospital Events: Including procedures, antibiotic start and stop dates in addition to other pertinent events   6/23-admit, intubated, right chest tube placed, right IJ CVC placed 6/24: Weaning vent settings and Levophed (down to 8 mcg). Na+ corrected to 124 this morning (108 on admission) ~ D5 @ 75 started. Tracheal aspirate with Gram+ cocci in pairs & rare gram + rods ~ Cefepime changed to Ceftriaxone, Azithromycin d/c.  Pleural fluid consistent with EXUDATE ~ pleural fluid culture pending 6/26 remains on vent, critically ill 6/27 plan for SAT/SBT  Interim History / Subjective:  Remains on vent Plan  for SAT/SBT Very anxious    Objective   Blood pressure (!) 85/57, pulse (!) 124, temperature 99 F (37.2 C), resp. rate 14, height _0  (1.651 m), weight 70.7 kg, SpO2 96 %.    Vent Mode: PRVC FiO2 (%):  [30 %-40 %] 30 % Set Rate:  [16 bmp] 16 bmp Vt Set:  [450 mL] 450 mL PEEP:  [5 cmH20] 5 cmH20 Pressure Support:  [5 cmH20] 5 cmH20 Plateau Pressure:  [16 cmH20-18 cmH20] 17 cmH20   Intake/Output Summary (Last 24 hours) at 07/06/2020 0725 Last data filed at 07/06/2020 8101 Gross per 24 hour  Intake 4719.75 ml  Output 4625 ml  Net 94.75 ml    Filed Weights   07/04/20 0500 07/05/20 0433 07/06/20 0500  Weight: 68.8 kg 72.1 kg 70.7 kg   REVIEW OF SYSTEMS  PATIENT IS UNABLE TO PROVIDE COMPLETE REVIEW OF SYSTEMS DUE TO SEVERE CRITICAL ILLNESS AND TOXIC METABOLIC ENCEPHALOPATHY   PHYSICAL EXAMINATION:  GENERAL:critically ill appearing, +resp distress HEAD: Normocephalic, atraumatic.  EYES: Pupils equal, round, reactive to light.  No scleral icterus.  MOUTH: Moist mucosal membrane. NECK: Supple. No thyromegaly. No nodules. No JVD.  PULMONARY: +rhonchi, +wheezing CARDIOVASCULAR: S1 and S2. Regular rate and rhythm. No murmurs, rubs, or gallops.  GASTROINTESTINAL: Soft, nontender, -distended. Positive bowel sounds.  MUSCULOSKELETAL: No swelling, clubbing, or edema.  NEUROLOGIC: obtunded SKIN:intact,warm,dry      Assessment & Plan:   Acute on chronic respiratory failure due to COPD exacerbation right lower lobe pneumonia, & large Right Pleural Effusion s/p chest tube placement  RT effusion s/p CHEST TUBE Keep in place for now while on vent   Severe ACUTE Hypoxic and Hypercapnic Respiratory Failure -continue Mechanical Ventilator support -continue Bronchodilator Therapy -Wean Fio2 and PEEP  as tolerated -VAP/VENT bundle implementation -will perform SAT/SBT when respiratory parameters are met  SEVERE COPD EXACERBATION -continue IV steroids as prescribed -continue  NEB THERAPY as prescribed -morphine as needed -wean fio2 as needed and tolerated   Septic shock -use vasopressors to keep MAP>65 as needed -follow ABG and LA -follow up cultures -emperic ABX -consider stress dose steroids -aggressive IV fluid Resuscitation  INFECTIOUS DISEASE -continue antibiotics as prescribed  NEUROLOGY ACUTE TOXIC METABOLIC ENCEPHALOPATHY Wean off sedation May need precedex  restart xanax 2 mg TID PRN   Best Practice (right click and "Reselect all SmartList Selections" daily)   Diet/type: NPO, start tube feeds Pain/Anxiety/Delirium protocol RASS goal -1 VAP protocol (if indicated): Yes DVT prophylaxis: prophylactic heparin  GI prophylaxis: PPI Glucose control:  not indicated Central venous access:  yes, and is still indicated Arterial line:  N/A Foley:  yes, and still needed Mobility:  bed rest  PT consulted: N/A Studies pending: N/A Culture data pending:sputum, urine , and blood Last reviewed culture data:today Antibiotics:cefepime, vanc, and flagyl , Azithromycin Antibiotic de-escalation:  Ceftriaxone, vanc, flagyl Stop date: to be determined  Daily labs: requested Code Status:  full code Last date of multidisciplinary goals of care discussion _0  ccm prognosis: Life-threating Disposition: remains critically ill, will stay in intensive care  Labs   CBC: Recent Labs  Lab 06/15/2020 0947 07/03/20 0442 07/05/20 0520  WBC 20.7* 19.1* 17.2*  NEUTROABS 15.1*  --   --   HGB 10.6* 9.8* 9.6*  HCT 29.2* 27.4* 28.0*  MCV 85.6 86.2 90.3  PLT 296 341 326       DVT/GI PRX  assessed I Assessed the need for Labs I Assessed the need for Foley I Assessed the need for Central Venous Line Family Discussion when available I Assessed the need for Mobilization I made an Assessment of medications to be adjusted accordingly Safety Risk assessment completed  CASE DISCUSSED IN MULTIDISCIPLINARY ROUNDS WITH ICU TEAM     Critical Care Time devoted  to patient care services described in this note is 45 minutes.  Critical care was necessary to treat /prevent imminent and life-threatening deterioration. Overall, patient is critically ill, prognosis is guarded.  Patient with Multiorgan failure and at high risk for cardiac arrest and death.    Corrin Parker, M.D.  [facility]  Medical Director [place] Director [facility] Cardio-Pulmonary Department

## 2020-07-07 ENCOUNTER — Inpatient Hospital Stay: Payer: Medicare Other

## 2020-07-07 ENCOUNTER — Encounter: Payer: Self-pay | Admitting: Pulmonary Disease

## 2020-07-07 DIAGNOSIS — J189 Pneumonia, unspecified organism: Secondary | ICD-10-CM | POA: Diagnosis present

## 2020-07-07 DIAGNOSIS — J90 Pleural effusion, not elsewhere classified: Secondary | ICD-10-CM | POA: Diagnosis present

## 2020-07-07 LAB — BASIC METABOLIC PANEL
Anion gap: 11 (ref 5–15)
BUN: 21 mg/dL (ref 8–23)
CO2: 32 mmol/L (ref 22–32)
Calcium: 8.6 mg/dL — ABNORMAL LOW (ref 8.9–10.3)
Chloride: 94 mmol/L — ABNORMAL LOW (ref 98–111)
Creatinine, Ser: 0.5 mg/dL (ref 0.44–1.00)
GFR, Estimated: >60 mL/min (ref >60)
Glucose, Bld: 103 mg/dL — ABNORMAL HIGH (ref 70–99)
Potassium: 2.8 mmol/L — ABNORMAL LOW (ref 3.5–5.1)
Sodium: 137 mmol/L (ref 135–145)

## 2020-07-07 LAB — CULTURE, BLOOD (ROUTINE X 2)
Culture: NO GROWTH
Culture: NO GROWTH
Special Requests: ADEQUATE

## 2020-07-07 LAB — MAGNESIUM: Magnesium: 2 mg/dL (ref 1.7–2.4)

## 2020-07-07 LAB — GLUCOSE, CAPILLARY: Glucose-Capillary: 89 mg/dL (ref 70–99)

## 2020-07-07 LAB — CBC
HCT: 29.5 % — ABNORMAL LOW (ref 36.0–46.0)
Hemoglobin: 10.3 g/dL — ABNORMAL LOW (ref 12.0–15.0)
MCH: 30.8 pg (ref 26.0–34.0)
MCHC: 34.9 g/dL (ref 30.0–36.0)
MCV: 88.3 fL (ref 80.0–100.0)
Platelets: 305 10*3/uL (ref 150–400)
RBC: 3.34 MIL/uL — ABNORMAL LOW (ref 3.87–5.11)
RDW: 13.4 % (ref 11.5–15.5)
WBC: 23.6 10*3/uL — ABNORMAL HIGH (ref 4.0–10.5)
nRBC: 0 % (ref 0.0–0.2)

## 2020-07-07 LAB — PHOSPHORUS: Phosphorus: 1.7 mg/dL — ABNORMAL LOW (ref 2.5–4.6)

## 2020-07-07 MED ORDER — ENSURE ENLIVE PO LIQD
237.0000 mL | Freq: Three times a day (TID) | ORAL | Status: DC
  Administered 2020-07-07: 237 mL via ORAL

## 2020-07-07 MED ORDER — ORAL CARE MOUTH RINSE
15.0000 mL | Freq: Two times a day (BID) | OROMUCOSAL | Status: DC
  Administered 2020-07-08 – 2020-07-09 (×4): 15 mL via OROMUCOSAL

## 2020-07-07 MED ORDER — HYDROMORPHONE HCL 1 MG/ML IJ SOLN
1.0000 mg | INTRAMUSCULAR | Status: DC | PRN
  Administered 2020-07-07: 1 mg via INTRAVENOUS
  Filled 2020-07-07: qty 1

## 2020-07-07 MED ORDER — DIAZEPAM 5 MG/ML IJ SOLN
5.0000 mg | Freq: Four times a day (QID) | INTRAMUSCULAR | Status: DC | PRN
  Filled 2020-07-07 (×2): qty 2

## 2020-07-07 MED ORDER — LINEZOLID 600 MG/300ML IV SOLN
600.0000 mg | Freq: Two times a day (BID) | INTRAVENOUS | Status: DC
  Administered 2020-07-07 – 2020-07-10 (×6): 600 mg via INTRAVENOUS
  Filled 2020-07-07 (×8): qty 300

## 2020-07-07 MED ORDER — MORPHINE SULFATE (PF) 2 MG/ML IV SOLN
2.0000 mg | INTRAVENOUS | Status: DC | PRN
  Administered 2020-07-07: 2 mg via INTRAVENOUS
  Filled 2020-07-07: qty 1

## 2020-07-07 MED ORDER — POTASSIUM CHLORIDE 10 MEQ/50ML IV SOLN
10.0000 meq | INTRAVENOUS | Status: AC
  Administered 2020-07-07 (×6): 10 meq via INTRAVENOUS
  Filled 2020-07-07 (×6): qty 50

## 2020-07-07 MED ORDER — POTASSIUM PHOSPHATES 15 MMOLE/5ML IV SOLN
20.0000 mmol | Freq: Once | INTRAVENOUS | Status: AC
  Administered 2020-07-07: 20 mmol via INTRAVENOUS
  Filled 2020-07-07: qty 6.67

## 2020-07-07 NOTE — Progress Notes (Signed)
1020 Down to CT. 1030 Partially sedated patient with Valium 2.5 mg. To keep her still. Tolerated procedure well. 1100 Back to ICU via bed.

## 2020-07-07 NOTE — Progress Notes (Signed)
Nutrition Follow-up  DOCUMENTATION CODES:  Not applicable  INTERVENTION:  Continue current diet as ordered. Advance as able per SLP. Encouraged PO intake Nursing to assist with meals MVI with minerals daily Ensure Enlive po TID, each supplement provides 350 kcal and 20 grams of protein  NUTRITION DIAGNOSIS:  Predicted suboptimal nutrient intake related to lethargy/confusion as evidenced by  (patient with AMS).  GOAL:  Patient will meet greater than or equal to 90% of their needs  MONITOR:  PO intake, Supplement acceptance, Diet advancement, Labs  REASON FOR ASSESSMENT:  Ventilator    ASSESSMENT:  64 year old female with history of COPD, depression, anxiety, hypertension and panic attacks who is admitted with sepsis and PNA requiring intubation for respiratory failure  Pt with pleural effusion s/p chest tube placement and intubation 6/23 Pt extubated 6/27. Pt pulled NGT Diet advanced to DYS 1 6/28  Pt resting in bed at the time of visit. Just returned from imaging procedure. Pt awake, but would not answer questions at this time aside from denying GI distress today. Sitter at bedside. SLP was able to work with pt and to advance her to a DYS 1 diet with thin liquids. Discussed intake with SLP who reports that pt's intake will greatly depend on her mentation versus her mechanical ability to swallow. Will add nutrition supplements to augment intake. If intake is poor, may require NGT to be replaced and TF restarted.   Revised estimated needs for extubation and improvement in clinical status.    Intake/Output Summary (Last 24 hours) at 07/07/2020 1223 Last data filed at 07/07/2020 1042 Gross per 24 hour  Intake --  Output 7620 ml  Net -7620 ml  Net IO Since Admission: -7,816.79 mL [07/07/20 1223]  Nutritionally Relevant Medications: Scheduled Meds:  linezolid  600 mg Per Tube Q12H   methylPREDNISolone (SOLU-MEDROL) injection  20 mg Intravenous Q12H   multivitamin  15 mL Per  Tube Daily   pantoprazole (PROTONIX) IV  40 mg Intravenous QHS   polyethylene glycol  17 g Oral Daily   senna  2 tablet Oral QHS   Continuous Infusions:  ceFEPime (MAXIPIME) IV 2 g (07/07/20 0556)   potassium chloride 10 mEq (07/07/20 0722)   PRN Meds: docusate, polyethylene glycol  Labs Reviewed: K 2.8 Phosphorus 1.7 SBG ranges from 103-114 mg/dL over the last 24 hours  NUTRITION - FOCUSED PHYSICAL EXAM: Flowsheet Row Most Recent Value  Orbital Region No depletion  Upper Arm Region No depletion  Thoracic and Lumbar Region No depletion  Buccal Region No depletion  Temple Region No depletion  Clavicle Bone Region No depletion  Clavicle and Acromion Bone Region No depletion  Scapular Bone Region No depletion  Dorsal Hand No depletion  Patellar Region Moderate depletion  Anterior Thigh Region Moderate depletion  Posterior Calf Region Moderate depletion  Edema (RD Assessment) Mild   Hair Reviewed  Eyes Reviewed  Mouth Reviewed  Skin Reviewed  Nails Reviewed   Diet Order:   Diet Order             DIET - DYS 1 Room service appropriate? Yes; Fluid consistency: Thin  Diet effective now                  EDUCATION NEEDS:  No education needs have been identified at this time  Skin:  Skin Assessment: Reviewed RN Assessment (ecchymosis)  Last BM:  6/27 per RN documentation  Height:  Ht Readings from Last 1 Encounters:  07/03/20 5\' 5"  (1.651 m)  Weight:  Wt Readings from Last 1 Encounters:  07/06/20 70.7 kg    Ideal Body Weight:  56.8 kg  BMI:  Body mass index is 25.94 kg/m.  Estimated Nutritional Needs:  Kcal:  1500-1700 kcal/d Protein:  80-95 g/d Fluid:  >1.6L/d  Ranell Patrick, RD, LDN Clinical Dietitian Pager on Amion

## 2020-07-07 NOTE — Progress Notes (Signed)
CT CHEST REVIEWED INDEPENDENTLY RT SIDED EFFUSION HAS NEARLY COMPLETELY RESOLVED THEREFORE DECISION TO REMOVE CHEST TUBE WAS MADE  CHEST TUBE WAS REMOVED WITHOUT ANY COMPLICATIONS SUTURES REMOVED FROM SKIN, VASELINE GAUZE AND GAUZE USED TO COVER WOUND WITH PAPER TAPE.  WILL FOLLOW ALONG AS NEEDED

## 2020-07-07 NOTE — Progress Notes (Signed)
Interesting day. Patient has progressed from slightly communicating to not communicating at all or making eye contact. Sitter has had to keep her in the bed. CT of the head repeated with no change. Patient had to be sedated with Valium to keep her still during CT. Right chest tube was d/ced per Dr. Mortimer Fries. Son,Dakota called to get an update.Only at 1800 did the patient even acknowledge her name but she still hasn't talked since 0800.

## 2020-07-07 NOTE — Evaluation (Signed)
Clinical/Bedside Swallow Evaluation Patient Details  Name: Tamara Mcclure MRN: [id number] Date of Birth: 19-Mar-1955  Today's Date: 07/07/2020 Time: SLP Start Time (ACUTE ONLY): 1015 SLP Stop Time (ACUTE ONLY): 1039 SLP Time Calculation (min) (ACUTE ONLY): 24 min  Past Medical History:  Past Medical History:  Diagnosis Date   Anxiety    Chronic lower back pain    COPD (chronic obstructive pulmonary disease) (HCC)    Depression    Hypertension    Panic disorder with agoraphobia and severe panic attacks    Peripheral sensory neuropathy    Past Surgical History:  Past Surgical History:  Procedure Laterality Date   I & D EXTREMITY Right 02/09/2017   Procedure: IRRIGATION AND DEBRIDEMENT EXTREMITY;  Surgeon: Lovell Sheehan, MD;  Location: [facility] ORS;  Service: Orthopedics;  Laterality: Right;   none     HPI:  65 year old female presented to ED on 07/09/2020 with altered mental status, acute on chronic respiratory failure. Patient had a fall a few days ago, seen at local urgent care.  Has been taking increasing doses of opiate.  In the ED she failed Narcan and BiPAP and was intubated. Past medical history of Anxiety, Chronic lower back pain, COPD (chronic obstructive pulmonary disease) ([place]), Depression, Hypertension, Panic disorder with agoraphobia and severe panic attacks, and Peripheral sensory neuropathy. Most recent chest CT on 07/07/2020 revealed largely resolved right pleural effusion with pleural drain in place. Mild or trace residual fluid and/or thickening in the right  costophrenic angle. 2. Small volume retained secretions in the airways. No convincing  pneumonia. Emphysema.   Assessment / Plan / Recommendation Clinical Impression  Pt was awake during evaluation but she continues with severe encephalopathy as evidenced by inability to follow directions, unintelligible utterances, continuous movement and positioning in bed, pulling at lines with difficulty redirecting pt for longer  than 1 sip of thin liquids or 1 bolus of puree. As such, pt's risk of aspiration, dehydration and malnutrition is moderately high and strict aspiration precautions must be followed when consuming any POs. During this evaluation, pt consumed ice chips, thin liquids via straw and puree with adequate oropharyngeal abilities. Pt's oral phase appeared functional and her swallow response appeared swift. While silent aspiration cannot be ruled out at bedside, pt was free of overt s/s of aspiration, no decline in respiratory function or change in vocal quality where noted. At this time, recommend conservative diet of dysphagia 1 with thin liquids via straw and medicine crushed in puree. MD may wish to consider continuing medicine via IV d/t mentation deficits to ensure timely administration. SLP Visit Diagnosis: Dysphagia, oropharyngeal phase (R13.12)    Aspiration Risk  Mild aspiration risk;Risk for inadequate nutrition/hydration    Diet Recommendation Dysphagia 1 (Puree);Thin liquid   Liquid Administration via: Straw Medication Administration: Crushed with puree Supervision: Full supervision/cueing for compensatory strategies;Staff to assist with self feeding Compensations: Minimize environmental distractions;Slow rate;Small sips/bites Postural Changes: Seated upright at 90 degrees;Remain upright for at least 30 minutes after po intake    Other  Recommendations Oral Care Recommendations: Oral care BID   Follow up Recommendations  (TBD)      Frequency and Duration min 2x/week  2 weeks       Prognosis Prognosis for Safe Diet Advancement: Good Barriers to Reach Goals: Cognitive deficits      Swallow Study   General Date of Onset: 07/06/2020 HPI: 65 year old female presented to ED on 07/01/2020 with altered mental status, acute on chronic respiratory failure. Patient had a  fall a few days ago, seen at local urgent care.  Has been taking increasing doses of opiate.  In the ED she failed Narcan and  BiPAP and was intubated. Past medical history of Anxiety, Chronic lower back pain, COPD (chronic obstructive pulmonary disease) ([place]), Depression, Hypertension, Panic disorder with agoraphobia and severe panic attacks, and Peripheral sensory neuropathy. Most recent chest CT on 07/07/2020 revealed largely resolved right pleural effusion with pleural drain in place. Mild or trace residual fluid and/or thickening in the right  costophrenic angle. 2. Small volume retained secretions in the airways. No convincing  pneumonia. Emphysema. Type of Study: Bedside Swallow Evaluation Previous Swallow Assessment: none in chart Diet Prior to this Study: NPO Temperature Spikes Noted: No Respiratory Status: Nasal cannula (2 liters) History of Recent Intubation: Yes Length of Intubations (days): 4 days Date extubated: 07/06/20 Behavior/Cognition: Confused;Agitated;Uncooperative;Distractible;Doesn't follow directions Oral Cavity Assessment: Within Functional Limits Oral Care Completed by SLP: Recent completion by staff Oral Cavity - Dentition: Adequate natural dentition Self-Feeding Abilities: Total assist Patient Positioning: Upright in bed;Partially reclined;Postural control adequate for testing Baseline Vocal Quality: Low vocal intensity Volitional Cough: Cognitively unable to elicit Volitional Swallow: Unable to elicit    Oral/Motor/Sensory Function Overall Oral Motor/Sensory Function:  (functional when consuming PO trials)   Ice Chips Ice chips: Within functional limits Presentation: Spoon   Thin Liquid Thin Liquid: Within functional limits Presentation: Straw    Nectar Thick Nectar Thick Liquid: Not tested   Honey Thick Honey Thick Liquid: Not tested   Puree Puree: Within functional limits Presentation: Spoon   Solid     Solid: Not tested     Rasa Degrazia B. Rutherford Nail M.S., CCC-SLP, [place] Pathologist [facility] Services Office [place] 07/07/2020,4:34  PM

## 2020-07-07 NOTE — Progress Notes (Addendum)
Progress Note    YING BLANKENHORN  FWY:[id number] DOB: 08-20-1955  DOA: 06/22/2020 PCP: Roselee Nova, MD      Brief Narrative:    Medical records reviewed and are as summarized below:  Tamara Mcclure is a 65 y.o. female with medical history significant for COPD, depression, hypertension, anxiety with panic attacks, chronic low back pain, peripheral neuropathy, fall few days prior to admission for which she was taking opioids, who was brought to the hospital for increasing shortness of breath, altered mental status and hypoxia.  Reportedly, her oxygen saturation was in the 70s on nonrebreather mask when EMS arrived.    She was treated with BiPAP and Narcan in the emergency room.  She was emergently intubated and placed on mechanical ventilation for acute hypoxic respiratory failure.  She was admitted to the ICU for acute hypoxic respiratory failure, COPD exacerbation, septic shock secondary to pneumonia.  She was treated with empiric IV antibiotics, IV fluids, IV steroids and vasopressors.  Right chest tube was placed for right pleural effusion.  Tracheal aspirate culture showed MRSA and Pseudomonas aeruginosa.  She was transferred from ICU to stepdown unit and [facility] team was consulted to take over the care of the patient on 07/07/2020.         Assessment/Plan:   Principal Problem:   Severe sepsis with septic shock (HCC) Active Problems:   Acute respiratory failure (HCC)   Pleural effusion on right   Community acquired pneumonia   Nutrition Problem: Predicted suboptimal nutrient intake Etiology: lethargy/confusion  Signs/Symptoms:  (patient with AMS)   Body mass index is 25.94 kg/m.   Acute hypoxic respiratory failure: S/p intubation and mechanical ventilation.  Extubated on 07/06/2020.  She is on 2 L/min oxygen via nasal cannula.  Septic shock secondary to MRSA and Pseudomonas pneumonia, leukocytosis: She is on IV cefepime and oral Zyvox.  COPD  exacerbation: Continue IV steroids and bronchodilators.  Right pleural effusion: S/p right chest tube placement on 06/29/2020.  Chest tube was removed today.  Acute metabolic encephalopathy: Patient is still confused.  Continue one-to-one sitter for safety.  Hypokalemia and hypophosphatemia: Replete potassium and phosphorus intravenously and monitor levels.  Dysphagia: Speech therapist recommended dysphagia 1 diet.   Diet Order             DIET - DYS 1 Room service appropriate? Yes; Fluid consistency: Thin  Diet effective now                      Consultants: Intensivist  Procedures: Intubation and mechanical ventilation on 06/24/2020. Right chest tube 06/21/2020. Right IJ central line placed on 06/18/2020    Medications:    chlorhexidine gluconate (MEDLINE KIT)  15 mL Mouth Rinse BID   Chlorhexidine Gluconate Cloth  6 each Topical Daily   feeding supplement  237 mL Oral TID BM   heparin  5,000 Units Subcutaneous Q8H   ipratropium-albuterol  3 mL Nebulization Q6H   mouth rinse  15 mL Mouth Rinse 10 times per day   methylPREDNISolone (SOLU-MEDROL) injection  20 mg Intravenous Q12H   multivitamin  15 mL Per Tube Daily   pantoprazole (PROTONIX) IV  40 mg Intravenous QHS   polyethylene glycol  17 g Oral Daily   QUEtiapine  100 mg Per Tube QHS   senna  2 tablet Oral QHS   Continuous Infusions:  ceFEPime (MAXIPIME) IV 2 g (07/07/20 1500)   linezolid (ZYVOX) IV  potassium PHOSPHATE IVPB (in mmol)       Anti-infectives (From admission, onward)    Start     Dose/Rate Route Frequency Ordered Stop   07/07/20 1500  linezolid (ZYVOX) IVPB 600 mg        600 mg 300 mL/hr over 60 Minutes Intravenous Every 12 hours 07/07/20 1403 07/13/20 0959   07/06/20 2200  linezolid (ZYVOX) tablet 600 mg  Status:  Discontinued        600 mg Per Tube Every 12 hours 07/06/20 1111 07/07/20 1403   07/04/20 1400  ceFEPIme (MAXIPIME) 2 g in sodium chloride 0.9 % 100 mL IVPB        2  g 200 mL/hr over 30 Minutes Intravenous Every 8 hours 07/04/20 1329     07/04/20 1100  cefTRIAXone (ROCEPHIN) 2 g in sodium chloride 0.9 % 100 mL IVPB  Status:  Discontinued        2 g 200 mL/hr over 30 Minutes Intravenous Every 24 hours 07/03/20 1013 07/03/20 1302   07/03/20 1600  vancomycin (VANCOREADY) IVPB 750 mg/150 mL  Status:  Discontinued        750 mg 150 mL/hr over 60 Minutes Intravenous Every 12 hours 07/03/20 1357 07/06/20 1102   07/03/20 1400  vancomycin (VANCOREADY) IVPB 1250 mg/250 mL  Status:  Discontinued        1,250 mg 166.7 mL/hr over 90 Minutes Intravenous Every 24 hours 06/16/2020 1249 07/03/20 1357   07/03/20 1400  cefTRIAXone (ROCEPHIN) 2 g in sodium chloride 0.9 % 100 mL IVPB  Status:  Discontinued        2 g 200 mL/hr over 30 Minutes Intravenous Every 24 hours 07/03/20 1302 07/04/20 1329   07/03/20 1100  cefTRIAXone (ROCEPHIN) 2 g in sodium chloride 0.9 % 100 mL IVPB  Status:  Discontinued        2 g 200 mL/hr over 30 Minutes Intravenous Every 24 hours 07/03/20 1011 07/03/20 1013   06/15/2020 2200  ceFEPIme (MAXIPIME) 2 g in sodium chloride 0.9 % 100 mL IVPB  Status:  Discontinued        2 g 200 mL/hr over 30 Minutes Intravenous Every 8 hours 06/20/2020 1249 07/03/20 1011   06/19/2020 2200  metroNIDAZOLE (FLAGYL) IVPB 500 mg  Status:  Discontinued        500 mg 100 mL/hr over 60 Minutes Intravenous Every 8 hours 06/15/2020 1249 07/06/20 1102   07/09/2020 2100  azithromycin (ZITHROMAX) 500 mg in sodium chloride 0.9 % 250 mL IVPB  Status:  Discontinued        500 mg 250 mL/hr over 60 Minutes Intravenous Every 24 hours 06/29/2020 1956 07/03/20 1011   06/23/2020 1100  ceFEPIme (MAXIPIME) 2 g in sodium chloride 0.9 % 100 mL IVPB        2 g 200 mL/hr over 30 Minutes Intravenous  Once 06/16/2020 1047 06/30/2020 1150   06/19/2020 1100  vancomycin (VANCOREADY) IVPB 1500 mg/300 mL        1,500 mg 150 mL/hr over 120 Minutes Intravenous  Once 06/17/2020 1052 06/11/2020 1529   07/09/2020 1045   metroNIDAZOLE (FLAGYL) IVPB 500 mg  Status:  Discontinued        500 mg 100 mL/hr over 60 Minutes Intravenous Every 8 hours 06/20/2020 1030 07/09/2020 1306              Family Communication/Anticipated D/C date and plan/Code Status   DVT prophylaxis: heparin injection 5,000 Units Start: 06/10/2020 1400 SCDs Start: 07/04/2020 1138  Code Status: Full Code  Family Communication: None Disposition Plan:    Status is: Inpatient  Remains inpatient appropriate because:IV treatments appropriate due to intensity of illness or inability to take PO  Dispo: The patient is from: Home              Anticipated d/c is to: Home              Patient currently is not medically stable to d/c.   Difficult to place patient No           Subjective:   She is confused and unable to provide any history.  Sitter and respiratory therapist at the bedside  Objective:    Vitals:   07/07/20 1300 07/07/20 1400 07/07/20 1403 07/07/20 1500  BP: (!) 149/108 (!) 158/112  (!) 148/97  Pulse: 100 (!) 113  95  Resp: (!) 22 (!) 22  (!) 24  Temp:      TempSrc:      SpO2: 93% 92% 92% 94%  Weight:      Height:       No data found.   Intake/Output Summary (Last 24 hours) at 07/07/2020 1510 Last data filed at 07/07/2020 1429 Gross per 24 hour  Intake 200 ml  Output 8920 ml  Net -8720 ml   Filed Weights   07/04/20 0500 07/05/20 0433 07/06/20 0500  Weight: 68.8 kg 72.1 kg 70.7 kg    Exam:  GEN: NAD SKIN: Warm and dry EYES: No pallor or icterus ENT: MMM CV: RRR PULM: Right chest tube in place at the time of my visit this morning ABD: soft, ND, NT, +BS CNS: Nonverbal, confused, non focal EXT: No edema or tenderness GU: Foley catheter with amber urine in the bag       Data Reviewed:   I have personally reviewed following labs and imaging studies:  Labs: Labs show the following:   Basic Metabolic Panel: Recent Labs  Lab 07/03/20 0442 07/03/20 1100 07/03/20 1522  07/04/20 0544 07/04/20 0957 07/05/20 0520 07/05/20 1130 07/05/20 1949 07/06/20 0032 07/06/20 0520 07/06/20 0813 07/07/20 0200  NA 124* 126*   < > 130*   < > 133*   < > 134* 133* 135 135 137  K 4.2 3.7  --  3.7  --  4.2  --   --   --  3.8  --  2.8*  CL 91* 93*  --  98  --  100  --   --   --  103  --  94*  CO2 24 23  --  25  --  26  --   --   --  27  --  32  GLUCOSE 136* 156*  --  158*  --  162*  --   --   --  128*  --  103*  BUN 15 13  --  11  --  16  --   --   --  22  --  21  CREATININE 0.84 0.76  --  0.61  --  0.61  --   --   --  0.55  --  0.50  CALCIUM 8.5* 8.5*  --  8.4*  --  8.3*  --   --   --  8.0*  --  8.6*  MG 1.7  --   --  2.0  --  2.0  --   --   --  1.8  --  2.0  PHOS 5.1*  --   --  3.2  --  3.1  --   --   --  2.2*  --  1.7*   < > = values in this interval not displayed.   GFR Estimated Creatinine Clearance: 71 mL/min (by C-G formula based on SCr of 0.5 mg/dL). Liver Function Tests: Recent Labs  Lab 06/15/2020 0947 06/16/2020 1804 07/03/20 0442  AST 39  --  23  ALT 29  --  23  ALKPHOS 226*  --  184*  BILITOT 0.9  --  0.7  PROT 7.0 6.3* 6.6  ALBUMIN 3.3*  --  3.0*   No results for input(s): LIPASE, AMYLASE in the last 168 hours. No results for input(s): AMMONIA in the last 168 hours. Coagulation profile Recent Labs  Lab 07/06/2020 1325  INR 1.7*    CBC: Recent Labs  Lab 06/17/2020 0947 07/03/20 0442 07/05/20 0520 07/07/20 0200  WBC 20.7* 19.1* 17.2* 23.6*  NEUTROABS 15.1*  --   --   --   HGB 10.6* 9.8* 9.6* 10.3*  HCT 29.2* 27.4* 28.0* 29.5*  MCV 85.6 86.2 90.3 88.3  PLT 296 341 326 305   Cardiac Enzymes: No results for input(s): CKTOTAL, CKMB, CKMBINDEX, TROPONINI in the last 168 hours. BNP (last 3 results) No results for input(s): PROBNP in the last 8760 hours. CBG: Recent Labs  Lab 07/06/20 0007 07/06/20 0412 07/06/20 0721 07/06/20 1103 07/07/20 0330  GLUCAP 137* 127* 113* 114* 89   D-Dimer: No results for input(s): DDIMER in the last 72  hours. Hgb A1c: No results for input(s): HGBA1C in the last 72 hours. Lipid Profile: Recent Labs    07/06/20 0520  TRIG 70   Thyroid function studies: No results for input(s): TSH, T4TOTAL, T3FREE, THYROIDAB in the last 72 hours.  Invalid input(s): FREET3  Anemia work up: No results for input(s): VITAMINB12, FOLATE, FERRITIN, TIBC, IRON, RETICCTPCT in the last 72 hours. Sepsis Labs: Recent Labs  Lab 06/13/2020 0947 06/25/2020 1325 07/03/20 0442 07/05/20 0520 07/07/20 0200  PROCALCITON  --  0.10  --   --   --   WBC 20.7*  --  19.1* 17.2* 23.6*  LATICACIDVEN 0.6 1.1  --   --   --     Microbiology Recent Results (from the past 240 hour(s))  Resp Panel by RT-PCR (Flu A&B, Covid) Nasopharyngeal Swab     Status: None   Collection Time: 06/24/2020  9:47 AM   Specimen: Nasopharyngeal Swab; Nasopharyngeal(NP) swabs in vial transport medium  Result Value Ref Range Status   SARS Coronavirus 2 by RT PCR NEGATIVE NEGATIVE Final    Comment: (NOTE) SARS-CoV-2 target nucleic acids are NOT DETECTED.  The SARS-CoV-2 RNA is generally detectable in upper respiratory specimens during the acute phase of infection. The lowest concentration of SARS-CoV-2 viral copies this assay can detect is 138 copies/mL. A negative result does not preclude SARS-Cov-2 infection and should not be used as the sole basis for treatment or other patient management decisions. A negative result may occur with  improper specimen collection/handling, submission of specimen other than nasopharyngeal swab, presence of viral mutation(s) within the areas targeted by this assay, and inadequate number of viral copies(<138 copies/mL). A negative result must be combined with clinical observations, patient history, and epidemiological information. The expected result is Negative.  Fact Sheet for Patients:  EntrepreneurPulse.com.au  Fact Sheet for Healthcare Providers:   IncredibleEmployment.be  This test is no t yet approved or cleared by the [place] FDA and  has been authorized for detection and/or  diagnosis of SARS-CoV-2 by FDA under an Emergency Use Authorization (EUA). This EUA will remain  in effect (meaning this test can be used) for the duration of the COVID-19 declaration under Section 564(b)(1) of the Act, 21 U.S.C.section 360bbb-3(b)(1), unless the authorization is terminated  or revoked sooner.       Influenza A by PCR NEGATIVE NEGATIVE Final   Influenza B by PCR NEGATIVE NEGATIVE Final    Comment: (NOTE) The Xpert Xpress SARS-CoV-2/FLU/RSV plus assay is intended as an aid in the diagnosis of influenza from Nasopharyngeal swab specimens and should not be used as a sole basis for treatment. Nasal washings and aspirates are unacceptable for Xpert Xpress SARS-CoV-2/FLU/RSV testing.  Fact Sheet for Patients: EntrepreneurPulse.com.au  Fact Sheet for Healthcare Providers: IncredibleEmployment.be  This test is not yet approved or cleared by the [place] FDA and has been authorized for detection and/or diagnosis of SARS-CoV-2 by FDA under an Emergency Use Authorization (EUA). This EUA will remain in effect (meaning this test can be used) for the duration of the COVID-19 declaration under Section 564(b)(1) of the Act, 21 U.S.C. section 360bbb-3(b)(1), unless the authorization is terminated or revoked.  Performed at [facility], [address] [place], [place] [address]   Blood culture (routine x 2)     Status: None   Collection Time: 06/24/2020 10:59 AM   Specimen: BLOOD  Result Value Ref Range Status   Specimen Description BLOOD BLOOD RIGHT FOREARM  Final   Special Requests   Final    BOTTLES DRAWN AEROBIC AND ANAEROBIC Blood Culture adequate volume   Culture   Final    NO GROWTH 5 DAYS Performed at [facility], [address] [place],  [place] [address]    Report Status 07/07/2020 FINAL  Final  Blood culture (routine x 2)     Status: None   Collection Time: 07/05/2020 10:59 AM   Specimen: BLOOD  Result Value Ref Range Status   Specimen Description BLOOD BLOOD RIGHT FOREARM  Final   Special Requests   Final    BOTTLES DRAWN AEROBIC AND ANAEROBIC Blood Culture results may not be optimal due to an inadequate volume of blood received in culture bottles   Culture   Final    NO GROWTH 5 DAYS Performed at [facility], [address] [place], [place] [address]    Report Status 07/07/2020 FINAL  Final  MRSA PCR Screening     Status: Abnormal   Collection Time: 07/08/2020  1:11 PM  Result Value Ref Range Status   MRSA by PCR POSITIVE (A) NEGATIVE Final    Comment:        The GeneXpert MRSA Assay (FDA approved for NASAL specimens only), is one component of a comprehensive MRSA colonization surveillance program. It is not intended to diagnose MRSA infection nor to guide or monitor treatment for MRSA infections. RESULT CALLED TO, READ BACK BY AND VERIFIED WITH: C.BAYNES,RN [id number] 06/28/2020 GM Performed at [facility], [address] [place], [place] [address]   Culture, Respiratory w Gram Stain     Status: None   Collection Time: 06/14/2020  1:23 PM   Specimen: Tracheal Aspirate; Respiratory  Result Value Ref Range Status   Specimen Description   Final    TRACHEAL ASPIRATE Performed at [facility], [address], [place], [place] [address]    Special Requests   Final    NONE Performed at [facility], [address] [place], [place] [address]  Gram Stain   Final    FEW WBC PRESENT,BOTH PMN AND MONONUCLEAR MODERATE GRAM POSITIVE COCCI IN PAIRS RARE GRAM POSITIVE RODS Performed at [facility], [place] [address], [place], [place] [address]    Culture   Final    RARE PSEUDOMONAS AERUGINOSA FEW METHICILLIN RESISTANT STAPHYLOCOCCUS AUREUS    Report Status 07/06/2020 FINAL  Final    Organism ID, Bacteria PSEUDOMONAS AERUGINOSA  Final   Organism ID, Bacteria METHICILLIN RESISTANT STAPHYLOCOCCUS AUREUS  Final      Susceptibility   Methicillin resistant staphylococcus aureus - MIC*    CIPROFLOXACIN <=0.5 SENSITIVE Sensitive     ERYTHROMYCIN >=8 RESISTANT Resistant     GENTAMICIN <=0.5 SENSITIVE Sensitive     OXACILLIN >=4 RESISTANT Resistant     TETRACYCLINE <=1 SENSITIVE Sensitive     VANCOMYCIN 1 SENSITIVE Sensitive     TRIMETH/SULFA <=10 SENSITIVE Sensitive     CLINDAMYCIN <=0.25 SENSITIVE Sensitive     RIFAMPIN <=0.5 SENSITIVE Sensitive     Inducible Clindamycin NEGATIVE Sensitive     * FEW METHICILLIN RESISTANT STAPHYLOCOCCUS AUREUS   Pseudomonas aeruginosa - MIC*    CEFTAZIDIME 2 SENSITIVE Sensitive     CIPROFLOXACIN <=0.25 SENSITIVE Sensitive     GENTAMICIN <=1 SENSITIVE Sensitive     IMIPENEM 2 SENSITIVE Sensitive     PIP/TAZO <=4 SENSITIVE Sensitive     CEFEPIME 2 SENSITIVE Sensitive     * RARE PSEUDOMONAS AERUGINOSA  Urine culture     Status: None   Collection Time: 07/03/2020  2:44 PM   Specimen: Urine, Random  Result Value Ref Range Status   Specimen Description   Final    URINE, RANDOM Performed at [facility], [address], [place], [place] [address]    Special Requests   Final    NONE Performed at [facility], [address], [place], [place] [address]    Culture   Final    NO GROWTH Performed at [facility], [place] [address] [place], [place] [address]    Report Status 07/04/2020 FINAL  Final  Body fluid culture w Gram Stain     Status: None   Collection Time: 06/14/2020  6:04 PM   Specimen: Pleura; Body Fluid  Result Value Ref Range Status   Specimen Description   Final    PLEURAL Performed at [facility], [address] [place], [place] [address]    Special Requests   Final    PLEURAL Performed at [facility], [address] [place], [place] [address]    Gram Stain   Final     RARE WBC PRESENT,BOTH PMN AND MONONUCLEAR NO ORGANISMS SEEN    Culture   Final    NO GROWTH 3 DAYS Performed at [facility], [place] [address], [place], [place] [address]    Report Status 07/06/2020 FINAL  Final    Procedures and diagnostic studies:  DG Chest 1 View  Result Date: 07/06/2020 CLINICAL DATA:  Pleural effusion on the right EXAM: CHEST  1 VIEW COMPARISON:  Yesterday FINDINGS: Endotracheal tube with tip between the clavicular heads and carina. The enteric tube reaches the stomach. Right IJ line with tip at the SVC. Chest tube on the right. No visible pneumothorax. Increased hazy density at both lung bases. Normal heart size. Notably severe left glenohumeral osteoarthritis. IMPRESSION: 1. Stable hardware positioning.  No visible pneumothorax. 2. Worsened aeration with increased opacity at the bases. Electronically Signed   By: Angelica Chessman  Watts M.D.   On: 07/06/2020 07:18   CT HEAD WO CONTRAST  Result Date: 07/07/2020 CLINICAL DATA:  Anoxic brain damage EXAM: CT HEAD WITHOUT CONTRAST TECHNIQUE: Contiguous axial images were obtained from the base of the skull through the vertex without intravenous contrast. COMPARISON:  CT head 06/25/2020 FINDINGS: Brain: Ventricle size and cerebral volume normal. Negative for cerebral edema. No significant effacement of the sulci compared to the prior study. No acute hemorrhage or infarct or mass. Vascular: Atherosclerotic calcification in the carotid and vertebral arteries. Negative for hyperdense vessel Skull: Negative Sinuses/Orbits: Air-fluid level sphenoid sinus has enlarged since the prior study. Mucosal edema throughout the maxillary sinus and ethmoid sinuses bilaterally. Negative orbit Other: None IMPRESSION: No acute abnormality. No evidence of cerebral edema or infarction. No intracranial hemorrhage Electronically Signed   By: Franchot Gallo M.D.   On: 07/07/2020 11:25   CT CHEST WO CONTRAST  Result Date: 07/07/2020 CLINICAL DATA:   65 year old female with respiratory failure. COPD. Recent right pneumonia, pleural effusion status post chest tube. EXAM: CT CHEST WITHOUT CONTRAST TECHNIQUE: Multidetector CT imaging of the chest was performed following the standard protocol without IV contrast. COMPARISON:  Chest radiograph 07/06/2020 and earlier. Chest CT 06/26/2020. FINDINGS: Cardiovascular: Right IJ central line now in place terminating in the SVC. Calcified aortic atherosclerosis. Calcified coronary artery atherosclerosis. Vascular patency is not evaluated in the absence of IV contrast. No cardiomegaly or pericardial effusion. Mediastinum/Nodes: Enteric tube removed. Mediastinal lymph nodes appear stable and within normal limits. Lungs/Pleura: Pigtail type right pleural drain now in place with drainage of the right pleural effusion seen on the prior CT. Trace residual fluid and/or pleural thickening in the right costophrenic angle. No pneumothorax. Upper lobe predominant centrilobular emphysema. Small volume retained secretions located dependently in the trachea and right side airways. No residual lung consolidation. Small calcified granuloma along the surface of the right diaphragm is chronic and stable since 2016. No areas of worsening ventilation since 06/28/2020. No left pleural effusion. Upper Abdomen: Negative visible noncontrast liver, spleen, pancreas, adrenal glands, left kidney, and bowel in the upper abdomen. Musculoskeletal: Osteopenia. Respiratory motion at the lower ribs. Advanced chronic left glenohumeral joint degeneration. Stable T7 and T8 compression fractures. No new osseous abnormality. IMPRESSION: 1. Largely resolved right pleural effusion with pleural drain in place. Mild or trace residual fluid and/or thickening in the right costophrenic angle. 2. Small volume retained secretions in the airways. No convincing pneumonia. Emphysema (ICD10-J43.9). 3. Aortic Atherosclerosis (ICD10-I70.0). Electronically Signed   By: Genevie Ann  M.D.   On: 07/07/2020 11:29               LOS: 5 days   Miranda Frese  [facility]   Pager on [email or web address]. If 7PM-7AM, please contact night-coverage at [email or web address]     07/07/2020, 3:10 PM

## 2020-07-07 NOTE — Progress Notes (Signed)
NAME:  Tamara Mcclure, MRN:  [id number], DOB:  May 09, 1955, LOS: 5 ADMISSION DATE:  07/01/2020, CONSULTATION DATE: 06/30/2020 REFERRING MD:  Lindell Noe MD, CHIEF COMPLAINT:   Acute respiratory failure, pneumonia  History of Present Illness:   65 year old with history of COPD, depression, hypertension, panic attacks presenting with altered mental status, acute on chronic respiratory failure.  Patient had a fall a few days ago, seen at local urgent care.  Has been taking increasing doses of opiate.  In the ED she failed Narcan and BiPAP and got intubated.  PCCM consulted for admission  Pertinent  Medical History    has a past medical history of Anxiety, Chronic lower back pain, COPD (chronic obstructive pulmonary disease) ([place]), Depression, Hypertension, Panic disorder with agoraphobia and severe panic attacks, and Peripheral sensory neuropathy.   Micro Data:  06/10/2020: SARS-CoV-2 and influenza PCR>> negative 06/18/2020: HIV>> nonreactive 06/15/2020: Strep pneumo urinary antigen>> negative 07/02/2021: Pleural fluid>> 07/02/2021: Blood culture>> 07/03/2020: MRSA PCR>> positive 06/18/2020: Tracheal aspirate>> gram-positive cocci in pairs, gram-negative positive rods 07/02/2021: Urine>>  Antimicrobials:  Cefepime 6/23>>  Ceftriaxone 6/24>>6/27 Flagyl 6/23>>6/27 Vancomycin 6/23>>6/27 Zyvoxx 6/27-->   3 significant Hospital Events: Including procedures, antibiotic start and stop dates in addition to other pertinent events   6/23-admit, intubated, right chest tube placed, right IJ CVC placed 6/24: Weaning vent settings and Levophed (down to 8 mcg). Na+ corrected to 124 this morning (108 on admission) ~ D5 @ 75 started. Tracheal aspirate with Gram+ cocci in pairs & rare gram + rods ~ Cefepime changed to Ceftriaxone, Azithromycin d/c.  Pleural fluid consistent with EXUDATE ~ pleural fluid culture pending 6/26 remains on vent, critically ill 6/27 plan for SAT/SBT 6/27 successfullt  extubated  Interim History / Subjective:  Extubated, minimal oxygen Chest tube in place Anxious last night Responds to vocal stimuli and follows simple commands    Objective   Blood pressure (!) 163/95, pulse (!) 106, temperature 99.68 F (37.6 C), resp. rate 20, height 5\' 5"  (1.651 m), weight 70.7 kg, SpO2 93 %.    Vent Mode: PSV FiO2 (%):  [30 %] 30 % PEEP:  [5 cmH20] 5 cmH20 Pressure Support:  [5 cmH20] 5 cmH20   Intake/Output Summary (Last 24 hours) at 07/07/2020 0748 Last data filed at 07/07/2020 4081 Gross per 24 hour  Intake 725.16 ml  Output 7795 ml  Net -7069.84 ml    Filed Weights   07/04/20 0500 07/05/20 0433 07/06/20 0500  Weight: 68.8 kg 72.1 kg 70.7 kg    Review of Systems: Limited due to lethargy  Other:  All other systems negative   Physical Examination:   General Appearance: No distress  Neuro:without focal findings,  speech normal,  HEENT: PERRLA, EOM intact.   Pulmonary: normal breath sounds, No wheezing.  Chest tube inplace CardiovascularNormal S1,S2.  No m/r/g.   Abdomen: Benign, Soft, non-tender. PSYCHIATRIC: letharguc   ALL OTHER ROS ARE NEGATIVE       Assessment & Plan:   Acute on chronic respiratory failure due to COPD exacerbation right lower lobe pneumonia, & large Right Pleural Effusion s/p chest tube placement MRSA AND PSEUDOMONAL PNEUMONIA COTNINEU IV ABX OXYGEN AS NEEDED DECREASE STREROIDS  OBTAIN CT CHEST AND CT HEAD   SEVERE COPD EXACERBATION -continue IV steroids as prescribed -continue NEB THERAPY as prescribed    Septic shock-resolved  INFECTIOUS DISEASE -continue antibiotics as prescribed  NEUROLOGY ACUTE TOXIC METABOLIC ENCEPHALOPATHY CT head   Best Practice (right click and "Reselect all SmartList Selections" daily)  Diet/type: NPO, start tube feeds Pain/Anxiety/Delirium protocol RASS goal -1 VAP protocol (if indicated): Yes DVT prophylaxis: prophylactic heparin  GI prophylaxis: PPI Glucose  control:  not indicated Central venous access:  yes, and is still indicated Arterial line:  N/A Foley:  yes, and still needed Mobility:  bed rest  PT consulted: N/A Studies pending: N/A Culture data pending:sputum, urine , and blood Last reviewed culture data:today Stop date: to be determined  Daily labs: requested Code Status:  full code Disposition: SD STATUS  Labs   CBC: Recent Labs  Lab 06/22/2020 0947 07/03/20 0442 07/05/20 0520 07/07/20 0200  WBC 20.7* 19.1* 17.2* 23.6*  NEUTROABS 15.1*  --   --   --   HGB 10.6* 9.8* 9.6* 10.3*  HCT 29.2* 27.4* 28.0* 29.5*  MCV 85.6 86.2 90.3 88.3  PLT 296 341 326 305        DVT/GI PRX  assessed I Assessed the need for Labs I Assessed the need for Foley I Assessed the need for Central Venous Line Family Discussion when available I Assessed the need for Mobilization I made an Assessment of medications to be adjusted accordingly Safety Risk assessment completed  CASE DISCUSSED IN MULTIDISCIPLINARY ROUNDS WITH ICU TEAM    Nithila Sumners Patricia Pesa, M.D.  [facility]  Medical Director [place] Director [place] Department

## 2020-07-08 ENCOUNTER — Inpatient Hospital Stay: Payer: Medicare Other

## 2020-07-08 DIAGNOSIS — R410 Disorientation, unspecified: Secondary | ICD-10-CM

## 2020-07-08 LAB — BASIC METABOLIC PANEL
Anion gap: 9 (ref 5–15)
Anion gap: 11 (ref 5–15)
BUN: 25 mg/dL — ABNORMAL HIGH (ref 8–23)
BUN: 25 mg/dL — ABNORMAL HIGH (ref 8–23)
CO2: 32 mmol/L (ref 22–32)
CO2: 29 mmol/L (ref 22–32)
Calcium: 8.6 mg/dL — ABNORMAL LOW (ref 8.9–10.3)
Calcium: 9.1 mg/dL (ref 8.9–10.3)
Chloride: 97 mmol/L — ABNORMAL LOW (ref 98–111)
Chloride: 103 mmol/L (ref 98–111)
Creatinine, Ser: 0.66 mg/dL (ref 0.44–1.00)
Creatinine, Ser: 0.8 mg/dL (ref 0.44–1.00)
GFR, Estimated: >60 mL/min (ref >60)
GFR, Estimated: >60 mL/min (ref >60)
Glucose, Bld: 157 mg/dL — ABNORMAL HIGH (ref 70–99)
Glucose, Bld: 155 mg/dL — ABNORMAL HIGH (ref 70–99)
Potassium: 2.9 mmol/L — ABNORMAL LOW (ref 3.5–5.1)
Potassium: 4 mmol/L (ref 3.5–5.1)
Sodium: 138 mmol/L (ref 135–145)
Sodium: 143 mmol/L (ref 135–145)

## 2020-07-08 LAB — MAGNESIUM
Magnesium: 2.2 mg/dL (ref 1.7–2.4)
Magnesium: 2.5 mg/dL — ABNORMAL HIGH (ref 1.7–2.4)

## 2020-07-08 LAB — PHOSPHORUS: Phosphorus: 3.6 mg/dL (ref 2.5–4.6)

## 2020-07-08 MED ORDER — POTASSIUM CHLORIDE 20 MEQ PO PACK: 40.0000 meq | PACK | Freq: Once | ORAL | Status: DC

## 2020-07-08 MED ORDER — ADULT MULTIVITAMIN LIQUID CH
15.0000 mL | Freq: Every day | ORAL | Status: DC
  Administered 2020-07-10 – 2020-07-14 (×5): 15 mL via ORAL
  Filled 2020-07-08 (×7): qty 15

## 2020-07-08 MED ORDER — HYDRALAZINE HCL 20 MG/ML IJ SOLN
10.0000 mg | Freq: Three times a day (TID) | INTRAMUSCULAR | Status: DC
  Administered 2020-07-08 (×2): 10 mg via INTRAVENOUS
  Filled 2020-07-08 (×3): qty 1

## 2020-07-08 MED ORDER — FUROSEMIDE 10 MG/ML IJ SOLN: 20.0000 mg | Freq: Once | INTRAMUSCULAR | Status: DC

## 2020-07-08 MED ORDER — METHYLPREDNISOLONE SODIUM SUCC 40 MG IJ SOLR: 20.0000 mg | Freq: Every day | INTRAMUSCULAR | Status: DC

## 2020-07-08 MED ORDER — GUAIFENESIN ER 600 MG PO TB12: 600.0000 mg | ORAL_TABLET | Freq: Two times a day (BID) | ORAL | Status: DC

## 2020-07-08 MED ORDER — POTASSIUM CHLORIDE 10 MEQ/50ML IV SOLN
10.0000 meq | INTRAVENOUS | Status: DC
  Filled 2020-07-08 (×4): qty 50

## 2020-07-08 MED ORDER — HYDROCOD POLST-CPM POLST ER 10-8 MG/5ML PO SUER: 5.0000 mL | Freq: Two times a day (BID) | ORAL | Status: DC | PRN

## 2020-07-08 MED ORDER — DOCUSATE SODIUM 50 MG/5ML PO LIQD: 100.0000 mg | Freq: Two times a day (BID) | ORAL | Status: DC | PRN

## 2020-07-08 MED ORDER — BUDESONIDE 0.5 MG/2ML IN SUSP
0.5000 mg | Freq: Two times a day (BID) | RESPIRATORY_TRACT | Status: DC
  Administered 2020-07-08 – 2020-07-20 (×24): 0.5 mg via RESPIRATORY_TRACT
  Filled 2020-07-08 (×24): qty 2

## 2020-07-08 MED ORDER — POLYETHYLENE GLYCOL 3350 17 G PO PACK: 17.0000 g | PACK | Freq: Every day | ORAL | Status: DC | PRN

## 2020-07-08 MED ORDER — DIAZEPAM 5 MG/ML IJ SOLN: 5.0000 mg | Freq: Two times a day (BID) | INTRAMUSCULAR | Status: DC | PRN

## 2020-07-08 MED ORDER — IPRATROPIUM-ALBUTEROL 0.5-2.5 (3) MG/3ML IN SOLN
3.0000 mL | Freq: Four times a day (QID) | RESPIRATORY_TRACT | Status: DC | PRN
  Filled 2020-07-08 (×2): qty 3

## 2020-07-08 MED ORDER — QUETIAPINE FUMARATE 25 MG PO TABS: 100.0000 mg | ORAL_TABLET | Freq: Every day | ORAL | Status: DC

## 2020-07-08 MED ORDER — POTASSIUM CHLORIDE 10 MEQ/50ML IV SOLN
10.0000 meq | INTRAVENOUS | Status: AC
  Administered 2020-07-08 (×6): 10 meq via INTRAVENOUS
  Filled 2020-07-08 (×7): qty 50

## 2020-07-08 NOTE — Progress Notes (Signed)
  Speech Language Pathology Treatment: Dysphagia  Patient Details Name: Tamara Mcclure MRN: [id number] DOB: 1955-05-16 Today's Date: 07/08/2020 Time: 4982-6415 SLP Time Calculation (min) (ACUTE ONLY): 17 min  Assessment / Plan / Recommendation Clinical Impression  Pt seen for ongoing management of diet. Pt is more altered today than on previous day. Her eyes are open, she is motionless and she doesn't display any response to her name or directions. Possible diagnoses being explored are Catatonia vs.Osmotic Demyelination Syndrome (serum Na+ noted to have rapidly corrected during 1st 24 hrs of admission), MD to obtain another MRI Brain and Psych is consulted.  Pt did open mouth to presentation of spoon to mouth with ice. She also demonstrate an oral movement response to ice chips. After ~ 2 trials, pt's response was more diminished.   At this time, recommend NPO with ice chips by nursing only as pt's mentation allows.   ST will follow remotely for improvements in mentation.     HPI HPI: 65 year old female presented to ED on 06/11/2020 with altered mental status, acute on chronic respiratory failure. Patient had a fall a few days ago, seen at local urgent care.  Has been taking increasing doses of opiate.  In the ED she failed Narcan and BiPAP and was intubated. Past medical history of Anxiety, Chronic lower back pain, COPD (chronic obstructive pulmonary disease) ([place]), Depression, Hypertension, Panic disorder with agoraphobia and severe panic attacks, and Peripheral sensory neuropathy. Most recent chest CT on 07/07/2020 revealed largely resolved right pleural effusion with pleural drain in place. Mild or trace residual fluid and/or thickening in the right  costophrenic angle. 2. Small volume retained secretions in the airways. No convincing  pneumonia. Emphysema.      SLP Plan  Continue with current plan of care       Recommendations  Diet recommendations: NPO (ice chips as  appropriate) Medication Administration: Via alternative means Compensations: Minimize environmental distractions;Slow rate;Small sips/bites Postural Changes and/or Swallow Maneuvers: Seated upright 90 degrees;Upright 30-60 min after meal                Oral Care Recommendations: Oral care QID Follow up Recommendations:  (TBD) SLP Visit Diagnosis: Dysphagia, oropharyngeal phase (R13.12) Plan: Continue with current plan of care       GO               Aquilla Voiles B. Rutherford Nail M.S., CCC-SLP, [place] Office [phone]  Stormy Fabian 07/08/2020, 12:21 PM

## 2020-07-08 NOTE — Consult Note (Signed)
[facility] Face-to-Face Psychiatry Consult   Reason for Consult: Consult for this 65 year old woman with a past history of prescription drug overuse who came into the hospital with altered mental status.  Consult because of continued altered mental status several days into hospitalization Referring Physician: Kasa Patient Identification: Tamara Mcclure MRN:  [id number] Principal Diagnosis: Subacute delirium Diagnosis:  Principal Problem:   Subacute delirium Active Problems:   Severe sepsis with septic shock ([place])   Acute respiratory failure (HCC)   Pleural effusion on right   Community acquired pneumonia   Total Time spent with patient: 1 hour  Subjective:   Tamara Mcclure is a 65 y.o. female patient admitted with patient was not able to give any information.  HPI: Patient seen chart reviewed.  65 year old woman who presented to the hospital about 6 days ago with altered mental status.  Coming from home with being passed out and nearly unarousable.  Not responding to Narcan initially.  Developed pulmonary symptoms required intubation and developed a aspiration pneumonia.  Patient is now extubated but remains in an abnormal mental state.  Came to see patient this afternoon.  She was awake with eyes open.  Patient made little eye contact.  There seemed to be some moments when she might of been paying attention.  May have eventually been able to close eyes to direction although it was hard to tell.  Not able to move any extremities to request.  Not able to speak in response to questions.  Patient was not agitated not grappling or trying to crawl out of bed.  Did not appear to be hallucinating.  Notes indicate that there had been a brief period in which she was more verbal but this has declined in the past day.  Nursing support who was there with her today reported that she had been in her current condition as long as they were aware.  MRI has been performed result is pending.  Patient has a history of  overuse of prescription medication in the past and has been treated for it here before.  She is currently prescribed 6 mg of alprazolam a day, 40 mg of hydrocodone daily and is also on regular doses of Lyrica and Soma.  Patient has been seen by the consult service at our hospital although it was many years ago.  At that time she had altered mental status that appeared to be related to excessive use of Xanax.  Nothing else is known about  Past Psychiatric History: Patient has been seen at our hospital on a by the consult service in 2016 for mental status changes related to excessive use of Xanax.  Nothing known about any psychiatric history since then.  Risk to Self:   Risk to Others:   Prior Inpatient Therapy:   Prior Outpatient Therapy:    Past Medical History:  Past Medical History:  Diagnosis Date   Anxiety    Chronic lower back pain    COPD (chronic obstructive pulmonary disease) ([place])    Depression    Hypertension    Panic disorder with agoraphobia and severe panic attacks    Peripheral sensory neuropathy     Past Surgical History:  Procedure Laterality Date   I & D EXTREMITY Right 02/09/2017   Procedure: IRRIGATION AND DEBRIDEMENT EXTREMITY;  Surgeon: Lovell Sheehan, MD;  Location: [facility] ORS;  Service: Orthopedics;  Laterality: Right;   none     Family History:  Family History  Problem Relation Age of Onset  Cancer Mother        Lung   Cancer Father        pancreatic   Breast cancer Neg Hx    Ovarian cancer Neg Hx    Colon cancer Neg Hx    Diabetes Neg Hx    Heart disease Neg Hx    Family Psychiatric  History: Unknown Social History:  Social History   Substance and Sexual Activity  Alcohol Use No     Social History   Substance and Sexual Activity  Drug Use Yes   Comment: Pt states prescribed oxy and xanax    Social History   Socioeconomic History   Marital status: Single    Spouse name: Not on file   Number of children: Not on file   Years of education:  Not on file   Highest education level: Not on file  Occupational History   Not on file  Tobacco Use   Smoking status: Every Day    Packs/day: 1.00    Years: 30.00    Pack years: 30.00    Types: Cigarettes, E-cigarettes   Smokeless tobacco: Never  Vaping Use   Vaping Use: Every day  Substance and Sexual Activity   Alcohol use: No   Drug use: Yes    Comment: Pt states prescribed oxy and xanax   Sexual activity: Not Currently  Other Topics Concern   Not on file  Social History Narrative   Not on file   Social Determinants of Health   Financial Resource Strain: Not on file  Food Insecurity: Not on file  Transportation Needs: Not on file  Physical Activity: Not on file  Stress: Not on file  Social Connections: Not on file   Additional Social History:    Allergies:   Allergies  Allergen Reactions   Penicillins Rash    Has patient had a PCN reaction causing immediate rash, facial/tongue/throat swelling, SOB or lightheadedness with hypotension: Yes Has patient had a PCN reaction causing severe rash involving mucus membranes or skin necrosis: No Has patient had a PCN reaction that required hospitalization: No Has patient had a PCN reaction occurring within the last 10 years: No If all of the above answers are "NO", then may proceed with Cephalosporin use.    Labs:  Results for orders placed or performed during the hospital encounter of 07/04/2020 (from the past 48 hour(s))  Basic metabolic panel     Status: Abnormal   Collection Time: 07/07/20  2:00 AM  Result Value Ref Range   Sodium 137 135 - 145 mmol/L   Potassium 2.8 (L) 3.5 - 5.1 mmol/L   Chloride 94 (L) 98 - 111 mmol/L   CO2 32 22 - 32 mmol/L   Glucose, Bld 103 (H) 70 - 99 mg/dL    Comment: Glucose reference range applies only to samples taken after fasting for at least 8 hours.   BUN 21 8 - 23 mg/dL   Creatinine, Ser 0.50 0.44 - 1.00 mg/dL   Calcium 8.6 (L) 8.9 - 10.3 mg/dL   GFR, Estimated >60 >60 mL/min     Comment: (NOTE) Calculated using the CKD-EPI Creatinine Equation (2021)    Anion gap 11 5 - 15    Comment: Performed at [facility], [address] [place], [place] [address]  Magnesium     Status: None   Collection Time: 07/07/20  2:00 AM  Result Value Ref Range   Magnesium 2.0 1.7 - 2.4 mg/dL    Comment: Performed  at [facility], [address] [place], [place] [address]  Phosphorus     Status: Abnormal   Collection Time: 07/07/20  2:00 AM  Result Value Ref Range   Phosphorus 1.7 (L) 2.5 - 4.6 mg/dL    Comment: Performed at [facility], [address] [place], [place] [address]  CBC     Status: Abnormal   Collection Time: 07/07/20  2:00 AM  Result Value Ref Range   WBC 23.6 (H) 4.0 - 10.5 K/uL   RBC 3.34 (L) 3.87 - 5.11 MIL/uL   Hemoglobin 10.3 (L) 12.0 - 15.0 g/dL   HCT 29.5 (L) 36.0 - 46.0 %   MCV 88.3 80.0 - 100.0 fL   MCH 30.8 26.0 - 34.0 pg   MCHC 34.9 30.0 - 36.0 g/dL   RDW 13.4 11.5 - 15.5 %   Platelets 305 150 - 400 K/uL   nRBC 0.0 0.0 - 0.2 %    Comment: Performed at [facility], [address] [place], [place] [address]  Glucose, capillary     Status: None   Collection Time: 07/07/20  3:30 AM  Result Value Ref Range   Glucose-Capillary 89 70 - 99 mg/dL    Comment: Glucose reference range applies only to samples taken after fasting for at least 8 hours.  Basic metabolic panel     Status: Abnormal   Collection Time: 07/08/20  4:41 AM  Result Value Ref Range   Sodium 138 135 - 145 mmol/L   Potassium 2.9 (L) 3.5 - 5.1 mmol/L   Chloride 97 (L) 98 - 111 mmol/L   CO2 32 22 - 32 mmol/L   Glucose, Bld 157 (H) 70 - 99 mg/dL    Comment: Glucose reference range applies only to samples taken after fasting for at least 8 hours.   BUN 25 (H) 8 - 23 mg/dL   Creatinine, Ser 0.66 0.44 - 1.00 mg/dL   Calcium 8.6 (L) 8.9 - 10.3 mg/dL   GFR, Estimated >60 >60 mL/min    Comment: (NOTE) Calculated using the CKD-EPI Creatinine  Equation (2021)    Anion gap 9 5 - 15    Comment: Performed at [facility], [address], [place], [place] [address]  Magnesium     Status: None   Collection Time: 07/08/20  4:41 AM  Result Value Ref Range   Magnesium 2.2 1.7 - 2.4 mg/dL    Comment: Performed at [facility], [address] [place], [place] [address]  Phosphorus     Status: None   Collection Time: 07/08/20  4:41 AM  Result Value Ref Range   Phosphorus 3.6 2.5 - 4.6 mg/dL    Comment: Performed at [facility], [address] [place],  [address]    Current Facility-Administered Medications  Medication Dose Route Frequency Provider Last Rate Last Admin   ceFEPIme (MAXIPIME) 2 g in sodium chloride 0.9 % 100 mL IVPB  2 g Intravenous Q8H Nelle Don, MD   Stopped at 07/08/20 0932   Chlorhexidine Gluconate Cloth 2 % PADS 6 each  6 each Topical Daily Mannam, Praveen, MD   6 each at 07/06/20 1518   diazepam (VALIUM) injection 5 mg  5 mg Intravenous Q6H PRN Bradly Bienenstock, NP       docusate (COLACE) 50 MG/5ML liquid 100 mg  100 mg Per Tube BID PRN Flora Lipps, MD       feeding supplement (ENSURE ENLIVE / ENSURE PLUS) liquid 237 mL  237 mL Oral TID BM Jennye Boroughs, MD   237 mL at 07/07/20 1403   heparin injection 5,000 Units  5,000 Units Subcutaneous Q8H Mannam, Praveen, MD   5,000 Units at 07/08/20 1413   HYDROmorphone (DILAUDID) injection 1 mg  1 mg Intravenous Q3H PRN Darel Hong D, NP   1 mg at 07/07/20 1245   ipratropium-albuterol (DUONEB) 0.5-2.5 (3) MG/3ML nebulizer solution 3 mL  3 mL Nebulization Q6H Mannam, Praveen, MD   3 mL at 07/08/20 1409   labetalol (NORMODYNE) injection 10 mg  10 mg Intravenous Q2H PRN Darel Hong D, NP   10 mg at 07/08/20 1227   linezolid (ZYVOX) IVPB 600 mg  600 mg Intravenous Q12H Jennye Boroughs, MD 300 mL/hr at 07/08/20 1229 600 mg at 07/08/20 1229   MEDLINE mouth rinse  15 mL Mouth Rinse BID Milus Banister, NP   15 mL at 07/08/20 1000    [START ON 07/09/2020] methylPREDNISolone sodium succinate (SOLU-MEDROL) 40 mg/mL injection 20 mg  20 mg Intravenous Daily Flora Lipps, MD       multivitamin liquid 15 mL  15 mL Per Tube Daily Nelle Don, MD   15 mL at 07/05/20 1023   pantoprazole (PROTONIX) injection 40 mg  40 mg Intravenous QHS Mannam, Praveen, MD   40 mg at 07/07/20 2128   polyethylene glycol (MIRALAX / GLYCOLAX) packet 17 g  17 g Per Tube Daily PRN Nelle Don, MD   17 g at 07/06/20 0718   polyethylene glycol (MIRALAX / GLYCOLAX) packet 17 g  17 g Oral Daily Dallie Piles, RPH       QUEtiapine (SEROQUEL) tablet 100 mg  100 mg Per Tube QHS Nelle Don, MD   100 mg at 07/07/20 2126   senna (SENOKOT) tablet 17.2 mg  2 tablet Oral QHS Dallie Piles, [facility]        Musculoskeletal: Strength & Muscle Tone: within normal limits Gait & Station: unsteady, unable to stand Patient leans: N/A            Psychiatric Specialty Exam:  Presentation  General Appearance:  No data recorded Eye Contact: No data recorded Speech: No data recorded Speech Volume: No data recorded Handedness: No data recorded  Mood and Affect  Mood: No data recorded Affect: No data recorded  Thought Process  Thought Processes: No data recorded Descriptions of Associations:No data recorded Orientation:No data recorded Thought Content:No data recorded History of Schizophrenia/Schizoaffective disorder:No data recorded Duration of Psychotic Symptoms:No data recorded Hallucinations:No data recorded Ideas of Reference:No data recorded Suicidal Thoughts:No data recorded Homicidal Thoughts:No data recorded  Sensorium  Memory: No data recorded Judgment: No data recorded Insight: No data recorded  Executive Functions  Concentration: No data recorded Attention Span: No data recorded Recall: No data recorded Fund of Knowledge: No data recorded Language: No data recorded  Psychomotor Activity  Psychomotor  Activity: No data recorded  Assets  Assets: No data recorded  Sleep  Sleep: No data recorded  Physical Exam: Physical Exam Constitutional:      Appearance: She is ill-appearing.  HENT:     Head: Normocephalic and atraumatic.     Mouth/Throat:     Pharynx: Oropharynx is clear.  Eyes:     Pupils: Pupils are equal, round, and reactive to light.  Cardiovascular:     Rate and Rhythm: Normal rate and regular rhythm.  Pulmonary:     Effort: Pulmonary effort is normal.     Breath sounds: Normal breath sounds.  Abdominal:  General: Abdomen is flat.     Palpations: Abdomen is soft.  Musculoskeletal:        General: Normal range of motion.  Skin:    General: Skin is warm and dry.  Neurological:     General: No focal deficit present.     Mental Status: She is alert. Mental status is at baseline.  Psychiatric:        Attention and Perception: She is inattentive.        Mood and Affect: Affect is flat.        Speech: She is noncommunicative.   Review of Systems  Unable to perform ROS: Patient nonverbal  Blood pressure (!) 162/106, pulse 94, temperature 99.5 F (37.5 C), temperature source Oral, resp. rate (!) 35, height 5\' 5"  (1.651 m), weight 70 kg, SpO2 92 %. Body mass index is 25.68 kg/m.  Treatment Plan Summary: Plan 65 year old woman with altered mental status.  Eyes open but unresponsive.  Tested her muscle tone.  Does not have waxy flexibility or any of the stereotyped movements often seen in catatonia.  Nevertheless differential diagnosis does include catatonia.  To me this probably seems the less likely diagnosis given that she does not have a known past history of psychotic disorder or major mood disorder or previous catatonic spells.  More likely diagnosis is probably the lingering effects of an overdose of multiple sedating medication.  Lack of response to Narcan would be explained by overdose on alprazolam Soma possibly other drugs as well as narcotics.  Certainly  possible to have altered mental state lingering on this long after overdoses depending on what medicines have been taken.  My recommendation for now would be not to add any medication unless she were to become agitated and require something for safety.  I would not yet restart any of her Xanax or other medications.  I will follow up daily.  If she is not improving over the next day or 2 we can make a trial of adding Ativan for catatonia but at this point I suspect it is more likely to just sedate her more and impair her breathing.  Disposition:  See note above.  We will follow in the hospital.  No addition of any medication at this point.  Alethia Berthold, MD 07/08/2020 2:21 PM

## 2020-07-08 NOTE — Progress Notes (Signed)
[facility] NAME: Jessaca Philippi    MR#:  [id number]  DATE OF BIRTH:  15-Jun-1955  SUBJECTIVE:  no family in the room. Patient has settled. She stares when asked questions. Cannot hold any meaningful conversation.  REVIEW OF SYSTEMS:   Review of Systems  Unable to perform ROS: Mental acuity   DRUG ALLERGIES:   Allergies  Allergen Reactions  . Penicillins Rash    Has patient had a PCN reaction causing immediate rash, facial/tongue/throat swelling, SOB or lightheadedness with hypotension: Yes Has patient had a PCN reaction causing severe rash involving mucus membranes or skin necrosis: No Has patient had a PCN reaction that required hospitalization: No Has patient had a PCN reaction occurring within the last 10 years: No If all of the above answers are "NO", then may proceed with Cephalosporin use.    VITALS:  Blood pressure (!) 162/106, pulse 94, temperature 99.5 F (37.5 C), temperature source Oral, resp. rate (!) 35, height 5\' 5"  (1.651 m), weight 70 kg, SpO2 92 %.  PHYSICAL EXAMINATION:   Physical Exam exam secondary to patient's mental acuity  GENERAL:  65 y.o.-year-old patient lying in the bed with no acute distress.  LUNGS: Normal breath sounds bilaterally, no wheezing, rales, rhonchi. No use of accessory muscles of respiration.  CARDIOVASCULAR: S1, S2 normal. No murmurs, rubs, or gallops.  ABDOMEN: Soft, nontender, nondistended. Bowel sounds present. No organomegaly or mass.  EXTREMITIES: No cyanosis, clubbing or edema b/l.    NEUROLOGIC: nonfocal. Tracks with her eyes. Nonverbal PSYCHIATRIC:  patient is alert   SKIN: No obvious rash, lesion, or ulcer.   LABORATORY PANEL:  CBC Recent Labs  Lab 07/07/20 0200  WBC 23.6*  HGB 10.3*  HCT 29.5*  PLT 305    Chemistries  Recent Labs  Lab 07/03/20 0442 07/03/20 1100 07/08/20 0441  NA 124*   < > 138  K 4.2   < > 2.9*  CL 91*   < > 97*  CO2 24   < > 32   GLUCOSE 136*   < > 157*  BUN 15   < > 25*  CREATININE 0.84   < > 0.66  CALCIUM 8.5*   < > 8.6*  MG 1.7   < > 2.2  AST 23  --   --   ALT 23  --   --   ALKPHOS 184*  --   --   BILITOT 0.7  --   --    < > = values in this interval not displayed.   Cardiac Enzymes No results for input(s): TROPONINI in the last 168 hours. RADIOLOGY:  CT HEAD WO CONTRAST  Result Date: 07/07/2020 CLINICAL DATA:  Anoxic brain damage EXAM: CT HEAD WITHOUT CONTRAST TECHNIQUE: Contiguous axial images were obtained from the base of the skull through the vertex without intravenous contrast. COMPARISON:  CT head 07/03/2020 FINDINGS: Brain: Ventricle size and cerebral volume normal. Negative for cerebral edema. No significant effacement of the sulci compared to the prior study. No acute hemorrhage or infarct or mass. Vascular: Atherosclerotic calcification in the carotid and vertebral arteries. Negative for hyperdense vessel Skull: Negative Sinuses/Orbits: Air-fluid level sphenoid sinus has enlarged since the prior study. Mucosal edema throughout the maxillary sinus and ethmoid sinuses bilaterally. Negative orbit Other: None IMPRESSION: No acute abnormality. No evidence of cerebral edema or infarction. No intracranial hemorrhage Electronically Signed   By: Franchot Gallo M.D.   On: 07/07/2020 11:25  CT CHEST WO CONTRAST  Result Date: 07/07/2020 CLINICAL DATA:  65 year old female with respiratory failure. COPD. Recent right pneumonia, pleural effusion status post chest tube. EXAM: CT CHEST WITHOUT CONTRAST TECHNIQUE: Multidetector CT imaging of the chest was performed following the standard protocol without IV contrast. COMPARISON:  Chest radiograph 07/06/2020 and earlier. Chest CT 06/16/2020. FINDINGS: Cardiovascular: Right IJ central line now in place terminating in the SVC. Calcified aortic atherosclerosis. Calcified coronary artery atherosclerosis. Vascular patency is not evaluated in the absence of IV contrast. No  cardiomegaly or pericardial effusion. Mediastinum/Nodes: Enteric tube removed. Mediastinal lymph nodes appear stable and within normal limits. Lungs/Pleura: Pigtail type right pleural drain now in place with drainage of the right pleural effusion seen on the prior CT. Trace residual fluid and/or pleural thickening in the right costophrenic angle. No pneumothorax. Upper lobe predominant centrilobular emphysema. Small volume retained secretions located dependently in the trachea and right side airways. No residual lung consolidation. Small calcified granuloma along the surface of the right diaphragm is chronic and stable since 2016. No areas of worsening ventilation since 07/03/2020. No left pleural effusion. Upper Abdomen: Negative visible noncontrast liver, spleen, pancreas, adrenal glands, left kidney, and bowel in the upper abdomen. Musculoskeletal: Osteopenia. Respiratory motion at the lower ribs. Advanced chronic left glenohumeral joint degeneration. Stable T7 and T8 compression fractures. No new osseous abnormality. IMPRESSION: 1. Largely resolved right pleural effusion with pleural drain in place. Mild or trace residual fluid and/or thickening in the right costophrenic angle. 2. Small volume retained secretions in the airways. No convincing pneumonia. Emphysema (ICD10-J43.9). 3. Aortic Atherosclerosis (ICD10-I70.0). Electronically Signed   By: Genevie Ann M.D.   On: 07/07/2020 11:29   MR BRAIN WO CONTRAST  Result Date: 07/08/2020 CLINICAL DATA:  Mental status change. EXAM: MRI HEAD WITHOUT CONTRAST TECHNIQUE: Multiplanar, multiecho pulse sequences of the brain and surrounding structures were obtained without intravenous contrast. COMPARISON:  Head CT 07/07/2020 FINDINGS: Some sequences are moderately to severely motion degraded despite use of faster, more motion resistant imaging protocols and attempts at repeat imaging. Brain: There is no evidence of an acute infarct, intracranial hemorrhage, mass, midline  shift, or extra-axial fluid collection. The ventricles and sulci are within normal limits for age. A subcentimeter T2 hyperintensity at the posterior aspect of the right lentiform nucleus may reflect a chronic lacunar infarct or dilated perivascular space. No significant white matter disease is evident elsewhere for age within limitations of motion. Vascular: Major intracranial vascular flow voids are preserved. Skull and upper cervical spine: No gross skull lesion. Sinuses/Orbits: Grossly unremarkable orbits. Subtotal opacification of the maxillary sinuses by combination of fluid and mucosal thickening. Moderate volume fluid in the left sphenoid sinus. Mild mucosal thickening in the frontal and ethmoid sinuses. Small left mastoid effusion. Other: 1.1 cm T2 hypointense nodule along the posterior margin of the right parotid tail. IMPRESSION: 1. No acute intracranial abnormality identified on this motion degraded study. 2. Sinusitis. 3. 1.1 cm nodule along the right parotid tail, indeterminate though may reflect a lymph node or small parotid neoplasm. Electronically Signed   By: Logan Bores M.D.   On: 07/08/2020 14:19   DG Chest Port 1 View  Result Date: 07/08/2020 CLINICAL DATA:  Acute respiratory failure EXAM: PORTABLE CHEST 1 VIEW COMPARISON:  07/06/2020 FINDINGS: Endotracheal tube and nasogastric tube have been removed. Pulmonary insufflation is stable. Minimal left basilar atelectasis. Previously noted left pleural effusion has resolved. No pneumothorax or pleural effusion. Right internal jugular central venous catheter is unchanged with its  tip at the superior vena cava. Cardiac size within normal limits. Pulmonary vascularity is normal. IMPRESSION: Interval extubation with stable pulmonary hypoinflation. Resolved left pleural effusion. Electronically Signed   By: Fidela Salisbury MD   On: 07/08/2020 03:56   ASSESSMENT AND PLAN:   DEZARAI PREW is a 65 y.o. female with medical history significant for  COPD, depression, hypertension, anxiety with panic attacks, chronic low back pain, peripheral neuropathy, fall few days prior to admission for which she was taking opioids, who was brought to the hospital for increasing shortness of breath, altered mental status and hypoxia.  Reportedly, her oxygen saturation was in the 70s on nonrebreather mask when EMS arrived.   Acute hypoxic respiratory failure secondary to septic shock with MRSA/Pseudomonas pneumonia --- IV linezolid and cefepime-- change to oral when patient able to take oral meds -- continue wean down oxygen. Currently on nasal cannula -- sepsis resolved  COPD exacerbation -- continue steroids and PRN bronchodilators  right pleural effusion chest status post right chest replacement-- now removed  acute metabolic encephalopathy/multifactorial/critical illness versus catatonia -- seen by psychiatry Dr. Weber Cooks-- will consider trial of Ativan if no improvement -- MRI brain no acute abnormality -- avoid benzodiazepines and narcotics -- patient currently not cooperating with PO diet due to change in mentation  Dysphagia -- speech therapy recommends dysphagia one diet  chronic depression and panic disorder -- was on Xanax before. -- currently discontinued benzodiazepines   Procedures: Intubation and mechanical ventilation on 06/17/2020--now extubated Right chest tube 07/04/2020--removed 07/06/2020 Right IJ central line placed on 07/01/2020>>>present Foley--removed 07/08/2020 Family communication :none today Consults : psychiatry, ICU CODE STATUS: full DVT Prophylaxis : heparin Level of care: Stepdown Status is: Inpatient  Remains inpatient appropriate because:Inpatient level of care appropriate due to severity of illness  Dispo: The patient is from: Home              Anticipated d/c is to:  TBD              Patient currently is not medically stable to d/c.   Difficult to place patient No        TOTAL TIME TAKING CARE OF  THIS PATIENT: 35 minutes.  >50% time spent on counselling and coordination of care  Note: This dictation was prepared with Dragon dictation along with smaller phrase technology. Any transcriptional errors that result from this process are unintentional.  Fritzi Mandes M.D    [facility]   CC: Primary care physician; Roselee Nova, MD Patient ID: EVALYSE STROOPE, female   DOB: 31-Jan-1955, 65 y.o.   MRN: [id number]

## 2020-07-08 NOTE — Progress Notes (Signed)
NAME:  TENNESSEE HANLON, MRN:  [id number], DOB:  1955/06/26, LOS: 6 ADMISSION DATE:  07/08/2020, CONSULTATION DATE: 07/05/2020 REFERRING MD:  Lindell Noe MD, CHIEF COMPLAINT:   Acute respiratory failure, pneumonia  History of Present Illness:   65 year old with history of COPD, depression, hypertension, panic attacks presenting with altered mental status, acute on chronic respiratory failure.  Patient had a fall a few days ago, seen at local urgent care.  Has been taking increasing doses of opiate.  In the ED she failed Narcan and BiPAP and got intubated.  PCCM consulted for admission  Pertinent  Medical History    has a past medical history of Anxiety, Chronic lower back pain, COPD (chronic obstructive pulmonary disease) ([place]), Depression, Hypertension, Panic disorder with agoraphobia and severe panic attacks, and Peripheral sensory neuropathy.   Micro Data:  06/23/2020: SARS-CoV-2 and influenza PCR>> negative 06/28/2020: HIV>> nonreactive 06/14/2020: Strep pneumo urinary antigen>> negative 06/29/2020: Legionella urinary antigen>>negative 07/02/2021: Pleural fluid>> no growth 07/02/2021: Blood culture>> no growth 06/13/2020: MRSA PCR>> positive 07/09/2020: Tracheal aspirate>> Pseudomonas Aeruginosa & MRSA 07/02/2021: Urine>> no growth  Antimicrobials:  Cefepime 6/23>> 6/24 ~ 6/25>> Ceftriaxone 6/24>> 6/25 Flagyl 6/23>> 6/27 Vancomycin 6/23>> 6/27 Linezolid 6/27>>  3 significant Hospital Events: Including procedures, antibiotic start and stop dates in addition to other pertinent events   6/23-admit, intubated, right chest tube placed, right IJ CVC placed 6/24: Weaning vent settings and Levophed (down to 8 mcg). Na+ corrected to 124 this morning (108 on admission) ~ D5 @ 75 started. Tracheal aspirate with Gram+ cocci in pairs & rare gram + rods ~ Cefepime changed to Ceftriaxone, Azithromycin d/c.  Pleural fluid consistent with EXUDATE ~ pleural fluid culture pending 6/26 remains on vent,  critically ill 6/27 Successfully extubated  6/28: Chest tube removed, remains with AMS, CT Head negative 6/29: Remains altered, concern for ? Catatonia vs. ? Osmotic Demyelination Syndrome (serum Na+ noted to have rapidly corrected during 1st 24 hrs of admission), Obtain MRI Brain,  Psych consulted  Interim History / Subjective:  -No acute events noted overnight -Afebrile, hemodynamically stable, no vasopressors, on 5L [place] -Remains altered, awake, withdraws from pain, but doesn't follow commands or track, -Concern for ? Catatonia vs. ? Osmotic Demyelination Syndrome ~ obtain MRI Brain, Psych consulted - K+ 2.9 this morning ~ receiving replacement  Objective   Blood pressure (!) 157/117, pulse (!) 106, temperature 99.5 F (37.5 C), temperature source Oral, resp. rate (!) 31, height 5\' 5"  (1.651 m), weight 70 kg, SpO2 92 %.        Intake/Output Summary (Last 24 hours) at 07/08/2020 0945 Last data filed at 07/08/2020 0800 Gross per 24 hour  Intake 2163.92 ml  Output 3165 ml  Net -1001.08 ml    Filed Weights   07/05/20 0433 07/06/20 0500 07/08/20 0500  Weight: 72.1 kg 70.7 kg 70 kg    Examination:  Gen:   Acute on chronically ill-appearing female, sitting in bed, on 5L [place], no acute distress HEENT: Atraumatic, normocephalic, EOMI, sclera anicteric Neck:     No masses; no thyromegaly, no JVD Lungs:    Coarse breath sounds throughout, no wheezing, even, nonlabored, no accessory muscle use CV:         Regular rate and rhythm; S1 S2, no M/R/G, good peripheral circulation Abd:      + bowel sounds; soft, non-tender; no palpable masses, no distension Ext:    No edema; adequate peripheral perfusion Skin:      Warm and dry; no obvious  rashes, lesions, ulcerations Neuro: Pt is awake, withdraws from pain to all extremities, but will not follow commands/trach/ or speak,  pupils PERRLA Psych: Unable to assess due to AMS  Labs/imaging that I havepersonally reviewed  (right click and "Reselect  all SmartList Selections" daily)   Labs 07/08/2020: K+ 2.9, Bicarb 32, Glucose 157, BUN 25, Cr. 0.66  CT Head 6/28>>No acute abnormality. No evidence of cerebral edema or infarction. No intracranial hemorrhage CT Chest w/o contrast 6/28>>IMPRESSION: 1. Largely resolved right pleural effusion with pleural drain in place. Mild or trace residual fluid and/or thickening in the right costophrenic angle. 2. Small volume retained secretions in the airways. No convincing pneumonia. Emphysema (ICD10-J43.9). 3. Aortic Atherosclerosis (ICD10-I70.0). CT head 6/23>>1. No acute intracranial abnormalities. 2. Chronic sinus inflammation. CT cervical spine 6/23>>No evidence for cervical spine fracture.  Advanced cervical spondylosis CT abdomen pelvis 6/23>>1. No definite CT evidence of acute traumatic injury to the chest, abdomen, or pelvis. 2. Large right pleural effusion and associated atelectasis or consolidation. No displaced rib fracture or other evident etiology. 3. There are wedge deformities of the T7 and T8 vertebral bodies, proximally 30% anterior height loss, which are new compared to most recent imaging of the chest dated 09/29/2014 but age indeterminate. Correlate for acute point tenderness. 4. Emphysema. 5. Coronary artery disease.  Resolved Hospital Problem list     Assessment & Plan:   Acute on chronic respiratory failure due to COPD exacerbation, right lower lobe pneumonia (Pseudomonas & MRSA), & large Right Pleural Effusion -Successfully extubated 07/06/20 -Supplemental O2 as needed to maintain O2 sats 88 to 92% -Follow intermittent chest x-ray and ABG as needed -Bronchodilators  -IV steroids (decreased Solu-Medrol to 20 mg daily on 6/29) -Continue antibiotics -Chest tube placement on 06/17/2020 ~ removed on 07/07/20 -Pleural fluid consistent with EXUDATE (Lights Criteria: Pleural LDH  979 is > 2/3 UNL of serum LDH (112) -Pleural fluid culture with no growth ~ consistent with  PARAPNEUMONIC Effusion  Septic Shock>>resolved -Continuous cardiac monitoring -Maintain MAP greater than 65 -Vasopressors as needed to maintain MAP goal -Lactic acid normalized -Echocardiogram on 06/30/2020 with LVEF 60 to 16%, grade 1 diastolic dysfunction, RV systolic function normal  Sepsis present on admission secondary to Pseudomonas & MRSA Pneumonia -Monitor fever curve -Trend WBCs and procalcitonin -Follow cultures as above -Continue Cefepime & Zyvox  Hypokalemia -Monitor I&O's / urinary output -Follow BMP  -Ensure adequate renal perfusion -Avoid nephrotoxic agents as able -Replace electrolytes as indicated -Pharmacy following for assistance with electrolyte replacement  Acute Metabolic Encephalopathy secondary to sepsis and Hyponatremia Concern for ? Catatonia vs ? Osmotic Demyelination Syndrome (serum Na+ rapidly corrected in 1st 24 hrs following presentation to ED; serum Na+ corrected from 108 at 09:45 on 06/11/2020  to serum Na+ 125 @ 05:00 following morning 07/03/20)  PMHx of Chronic opiate abuse -Frequent Neuro checks -Provide supportive care -Avoid sedating meds as able -CT head 07/07/2020 negative for acute intracranial abnormality -Discussed with Dr. Mortimer Fries ~ Psychiatry consulted, appreciate input -Obtain MRI Brain  Best Practice (right click and "Reselect all SmartList Selections" daily)   Diet/type: NPO, Pain/Anxiety/Delirium protocol N/A VAP protocol (if indicated): N/A DVT prophylaxis: prophylactic heparin  GI prophylaxis: PPI Glucose control:  yes, SSI Central venous access:  yes, and is still indicated Arterial line:  N/A Foley:  yes, and still needed Mobility:  bed rest  PT consulted: N/A Studies pending: MRI Brain Culture data pending: N/A Last reviewed culture data:today Antibiotics: Cefepime, Zvyox Antibiotic de-escalation:  N/A, on appropriate ABX  Stop date: to be determined  Daily labs: requested Code Status:  full code Last date of  multidisciplinary goals of care discussion [07/08/20] Disposition: remains critically ill, will stay in intensive care        Labs   CBC: Recent Labs  Lab 06/24/2020 0947 07/03/20 0442 07/05/20 0520 07/07/20 0200  WBC 20.7* 19.1* 17.2* 23.6*  NEUTROABS 15.1*  --   --   --   HGB 10.6* 9.8* 9.6* 10.3*  HCT 29.2* 27.4* 28.0* 29.5*  MCV 85.6 86.2 90.3 88.3  PLT 296 341 326 305     Basic Metabolic Panel: Recent Labs  Lab 07/04/20 0544 07/04/20 0957 07/05/20 0520 07/05/20 1130 07/06/20 0032 07/06/20 0520 07/06/20 0813 07/07/20 0200 07/08/20 0441  NA 130*   < > 133*   < > 133* 135 135 137 138  K 3.7  --  4.2  --   --  3.8  --  2.8* 2.9*  CL 98  --  100  --   --  103  --  94* 97*  CO2 25  --  26  --   --  27  --  32 32  GLUCOSE 158*  --  162*  --   --  128*  --  103* 157*  BUN 11  --  16  --   --  22  --  21 25*  CREATININE 0.61  --  0.61  --   --  0.55  --  0.50 0.66  CALCIUM 8.4*  --  8.3*  --   --  8.0*  --  8.6* 8.6*  MG 2.0  --  2.0  --   --  1.8  --  2.0 2.2  PHOS 3.2  --  3.1  --   --  2.2*  --  1.7* 3.6   < > = values in this interval not displayed.    GFR: Estimated Creatinine Clearance: 70.7 mL/min (by C-G formula based on SCr of 0.66 mg/dL). Recent Labs  Lab 06/20/2020 0947 06/29/2020 1325 07/03/20 0442 07/05/20 0520 07/07/20 0200  PROCALCITON  --  0.10  --   --   --   WBC 20.7*  --  19.1* 17.2* 23.6*  LATICACIDVEN 0.6 1.1  --   --   --      Liver Function Tests: Recent Labs  Lab 06/21/2020 0947 06/24/2020 1804 07/03/20 0442  AST 39  --  23  ALT 29  --  23  ALKPHOS 226*  --  184*  BILITOT 0.9  --  0.7  PROT 7.0 6.3* 6.6  ALBUMIN 3.3*  --  3.0*    No results for input(s): LIPASE, AMYLASE in the last 168 hours. No results for input(s): AMMONIA in the last 168 hours.  ABG    Component Value Date/Time   PHART 7.27 (L) 07/04/2020 1115   PCO2ART 47 06/25/2020 1115   PO2ART 85 06/13/2020 1115   HCO3 21.6 06/27/2020 1115   ACIDBASEDEF 5.3 (H)  07/08/2020 1115   O2SAT 94.8 06/16/2020 1115      Coagulation Profile: Recent Labs  Lab 07/09/2020 1325  INR 1.7*     Cardiac Enzymes: No results for input(s): CKTOTAL, CKMB, CKMBINDEX, TROPONINI in the last 168 hours.  HbA1C: Hgb A1c MFr Bld  Date/Time Value Ref Range Status  06/24/2020 01:25 PM 5.8 (H) 4.8 - 5.6 % Final    Comment:    (NOTE)         Prediabetes: 5.7 -  6.4         Diabetes: >6.4         Glycemic control for adults with diabetes: <7.0   03/11/2013 12:00 AM 5.8 4.0 - 6.0 % Final    CBG: Recent Labs  Lab 07/06/20 0007 07/06/20 0412 07/06/20 0721 07/06/20 1103 07/07/20 0330  GLUCAP 137* 127* 113* 114* 89     Review of Systems:   Unable to obtain due to altered mental status  Past Medical History:  She,  has a past medical history of Anxiety, Chronic lower back pain, COPD (chronic obstructive pulmonary disease) ([place]), Depression, Hypertension, Panic disorder with agoraphobia and severe panic attacks, and Peripheral sensory neuropathy.   Surgical History:   Past Surgical History:  Procedure Laterality Date   I & D EXTREMITY Right 02/09/2017   Procedure: IRRIGATION AND DEBRIDEMENT EXTREMITY;  Surgeon: Lovell Sheehan, MD;  Location: [facility] ORS;  Service: Orthopedics;  Laterality: Right;   none       Social History:   reports that she has been smoking cigarettes and e-cigarettes. She has a 30.00 pack-year smoking history. She has never used smokeless tobacco. She reports current drug use. She reports that she does not drink alcohol.   Family History:  Her family history includes Cancer in her father and mother. There is no history of Breast cancer, Ovarian cancer, Colon cancer, Diabetes, or Heart disease.   Allergies Allergies  Allergen Reactions   Penicillins Rash    Has patient had a PCN reaction causing immediate rash, facial/tongue/throat swelling, SOB or lightheadedness with hypotension: Yes Has patient had a PCN reaction causing severe  rash involving mucus membranes or skin necrosis: No Has patient had a PCN reaction that required hospitalization: No Has patient had a PCN reaction occurring within the last 10 years: No If all of the above answers are "NO", then may proceed with Cephalosporin use.     Home Medications  Prior to Admission medications   Medication Sig Start Date End Date Taking? Authorizing Provider  albuterol (PROVENTIL HFA;VENTOLIN HFA) 108 (90 Base) MCG/ACT inhaler Inhale 2 puffs into the lungs every 6 (six) hours as needed for wheezing or shortness of breath. 11/16/15   Roselee Nova, MD  alprazolam Duanne Moron) 2 MG tablet Take 1 tablet (2 mg total) by mouth 3 (three) times daily as needed for anxiety. 01/12/17   Roselee Nova, MD  carisoprodol (SOMA) 350 MG tablet Take 1 tablet (350 mg total) by mouth 3 (three) times daily. 01/12/17   Roselee Nova, MD  COMBIVENT RESPIMAT 20-100 MCG/ACT AERS respimat INHALE 1 PUFF INTO THE LUNGS EVERY 6 HOURS AS NEEDED FOR WHEEZING OR SHORTNESS OF BREATH 02/15/17   Rochel Brome A, MD  fluticasone furoate-vilanterol (BREO ELLIPTA) 100-25 MCG/INH AEPB Inhale 1 puff into the lungs daily. 11/26/15   Roselee Nova, MD  ibuprofen (ADVIL,MOTRIN) 200 MG tablet Take 200 mg by mouth every 6 (six) hours as needed for mild pain.     [provider]  potassium chloride (K-DUR) 10 MEQ tablet TAKE 1 TABLET BY MOUTH ONCE A DAY 07/16/15   Roselee Nova, MD  pregabalin (LYRICA) 75 MG capsule Take 1 capsule (75 mg total) by mouth 3 (three) times daily. 01/12/17   Roselee Nova, MD  QUEtiapine (SEROQUEL) 100 MG tablet Take 1 tablet (100 mg total) by mouth at bedtime. 12/13/16   Roselee Nova, MD    Critical care time: 73  minutes   The patient is critically ill with multiple organ system failure and requires high complexity decision making for assessment and support, frequent evaluation and titration of therapies, advanced monitoring, review of radiographic studies and  interpretation of complex data.   Darel Hong, AGACNP-BC [facility] Prefer epic messenger for cross cover needs If after hours, please call E-link

## 2020-07-09 ENCOUNTER — Encounter: Payer: Self-pay | Admitting: Pulmonary Disease

## 2020-07-09 ENCOUNTER — Inpatient Hospital Stay: Payer: Medicare Other

## 2020-07-09 LAB — BASIC METABOLIC PANEL
Anion gap: 13 (ref 5–15)
BUN: 41 mg/dL — ABNORMAL HIGH (ref 8–23)
CO2: 27 mmol/L (ref 22–32)
Calcium: 8.9 mg/dL (ref 8.9–10.3)
Chloride: 107 mmol/L (ref 98–111)
Creatinine, Ser: 1.01 mg/dL — ABNORMAL HIGH (ref 0.44–1.00)
GFR, Estimated: >60 mL/min (ref >60)
Glucose, Bld: 127 mg/dL — ABNORMAL HIGH (ref 70–99)
Potassium: 3.6 mmol/L (ref 3.5–5.1)
Sodium: 147 mmol/L — ABNORMAL HIGH (ref 135–145)

## 2020-07-09 LAB — CBC
HCT: 38.7 % (ref 36.0–46.0)
Hemoglobin: 13 g/dL (ref 12.0–15.0)
MCH: 30.4 pg (ref 26.0–34.0)
MCHC: 33.6 g/dL (ref 30.0–36.0)
MCV: 90.6 fL (ref 80.0–100.0)
Platelets: 425 10*3/uL — ABNORMAL HIGH (ref 150–400)
RBC: 4.27 MIL/uL (ref 3.87–5.11)
RDW: 14.2 % (ref 11.5–15.5)
WBC: 47.4 10*3/uL — ABNORMAL HIGH (ref 4.0–10.5)
nRBC: 0 % (ref 0.0–0.2)

## 2020-07-09 LAB — CBC WITH DIFFERENTIAL/PLATELET
Basophils Absolute: 0 10*3/uL (ref 0.0–0.1)
Basophils Relative: 0 %
Eosinophils Absolute: 0 10*3/uL (ref 0.0–0.5)
Eosinophils Relative: 0 %
HCT: 38.1 % (ref 36.0–46.0)
Hemoglobin: 12.9 g/dL (ref 12.0–15.0)
Lymphocytes Relative: 6 %
Lymphs Abs: 2.9 10*3/uL (ref 0.7–4.0)
MCH: 31.2 pg (ref 26.0–34.0)
MCHC: 33.9 g/dL (ref 30.0–36.0)
MCV: 92.3 fL (ref 80.0–100.0)
Monocytes Absolute: 3.6 10*3/uL — ABNORMAL HIGH (ref 0.1–1.0)
Monocytes Relative: 7 %
Neutro Abs: 43 10*3/uL — ABNORMAL HIGH (ref 1.7–7.7)
Neutrophils Relative %: 84 %
Platelets: 392 10*3/uL (ref 150–400)
RBC: 4.13 MIL/uL (ref 3.87–5.11)
RDW: 45.9 % — ABNORMAL HIGH (ref 11.5–15.5)
WBC: 51 10*3/uL (ref 4.0–10.5)
nRBC: 0 /100 WBC

## 2020-07-09 LAB — PROCALCITONIN: Procalcitonin: 0.22 ng/mL

## 2020-07-09 LAB — PATHOLOGIST SMEAR REVIEW

## 2020-07-09 LAB — GLUCOSE, CAPILLARY
Glucose-Capillary: 119 mg/dL — ABNORMAL HIGH (ref 70–99)
Glucose-Capillary: 128 mg/dL — ABNORMAL HIGH (ref 70–99)
Glucose-Capillary: 141 mg/dL — ABNORMAL HIGH (ref 70–99)

## 2020-07-09 LAB — PHOSPHORUS: Phosphorus: 4.2 mg/dL (ref 2.5–4.6)

## 2020-07-09 LAB — MAGNESIUM: Magnesium: 2.7 mg/dL — ABNORMAL HIGH (ref 1.7–2.4)

## 2020-07-09 MED ORDER — GUAIFENESIN 100 MG/5ML PO SOLN
5.0000 mL | ORAL | Status: DC | PRN
  Filled 2020-07-09: qty 5

## 2020-07-09 MED ORDER — FENTANYL CITRATE (PF) 100 MCG/2ML IJ SOLN
INTRAMUSCULAR | Status: AC
  Administered 2020-07-10: 100 ug via INTRAVENOUS
  Filled 2020-07-09: qty 2

## 2020-07-09 MED ORDER — ROCURONIUM BROMIDE 50 MG/5ML IV SOLN
INTRAVENOUS | Status: AC
  Administered 2020-07-10: 10 mg via INTRAVENOUS
  Filled 2020-07-09: qty 1

## 2020-07-09 MED ORDER — DIAZEPAM 5 MG/ML IJ SOLN
2.5000 mg | Freq: Four times a day (QID) | INTRAMUSCULAR | Status: DC | PRN
  Administered 2020-07-09 (×2): 2.5 mg via INTRAVENOUS
  Filled 2020-07-09 (×2): qty 2

## 2020-07-09 MED ORDER — BISACODYL 10 MG RE SUPP
10.0000 mg | Freq: Once | RECTAL | Status: AC
  Administered 2020-07-09: 10 mg via RECTAL
  Filled 2020-07-09: qty 1

## 2020-07-09 MED ORDER — VANCOMYCIN 50 MG/ML ORAL SOLUTION
500.0000 mg | Freq: Four times a day (QID) | ORAL | Status: DC
  Administered 2020-07-09 – 2020-07-11 (×7): 500 mg
  Filled 2020-07-09 (×13): qty 10

## 2020-07-09 MED ORDER — ORAL CARE MOUTH RINSE
15.0000 mL | Freq: Two times a day (BID) | OROMUCOSAL | Status: DC
  Administered 2020-07-09 (×2): 15 mL via OROMUCOSAL

## 2020-07-09 MED ORDER — IPRATROPIUM-ALBUTEROL 0.5-2.5 (3) MG/3ML IN SOLN
3.0000 mL | RESPIRATORY_TRACT | Status: DC
  Administered 2020-07-09 – 2020-07-17 (×48): 3 mL via RESPIRATORY_TRACT
  Filled 2020-07-09 (×47): qty 3

## 2020-07-09 MED ORDER — ACETAMINOPHEN 650 MG RE SUPP
325.0000 mg | RECTAL | Status: DC | PRN
  Administered 2020-07-09 – 2020-07-11 (×2): 325 mg via RECTAL
  Filled 2020-07-09 (×2): qty 1

## 2020-07-09 MED ORDER — LORAZEPAM 2 MG/ML IJ SOLN
1.0000 mg | INTRAMUSCULAR | Status: DC | PRN
  Administered 2020-07-09: 2 mg via INTRAVENOUS
  Administered 2020-07-09: 1 mg via INTRAVENOUS
  Filled 2020-07-09 (×2): qty 1

## 2020-07-09 MED ORDER — FREE WATER
200.0000 mL | Status: DC
  Administered 2020-07-09 (×3): 200 mL

## 2020-07-09 MED ORDER — MORPHINE SULFATE (PF) 2 MG/ML IV SOLN
2.0000 mg | Freq: Once | INTRAVENOUS | Status: AC
  Administered 2020-07-09: 2 mg via INTRAVENOUS
  Filled 2020-07-09: qty 1

## 2020-07-09 MED ORDER — OSMOLITE 1.5 CAL PO LIQD
1000.0000 mL | ORAL | Status: DC
  Administered 2020-07-09 – 2020-07-18 (×8): 1000 mL

## 2020-07-09 MED ORDER — ACETAMINOPHEN 325 MG PO TABS
650.0000 mg | ORAL_TABLET | Freq: Four times a day (QID) | ORAL | Status: DC | PRN
  Administered 2020-07-09 – 2020-07-18 (×6): 650 mg via ORAL
  Filled 2020-07-09 (×6): qty 2

## 2020-07-09 MED ORDER — ACETYLCYSTEINE 20 % IN SOLN
2.0000 mL | Freq: Once | RESPIRATORY_TRACT | Status: AC
  Administered 2020-07-10: 2 mL via RESPIRATORY_TRACT
  Filled 2020-07-09: qty 4

## 2020-07-09 MED ORDER — PROSOURCE TF PO LIQD
45.0000 mL | Freq: Two times a day (BID) | ORAL | Status: DC
  Administered 2020-07-09 – 2020-07-16 (×13): 45 mL
  Filled 2020-07-09: qty 45

## 2020-07-09 MED ORDER — SODIUM CHLORIDE 0.9 % IV SOLN
2.0000 g | Freq: Two times a day (BID) | INTRAVENOUS | Status: DC
  Administered 2020-07-09 – 2020-07-10 (×2): 2 g via INTRAVENOUS
  Filled 2020-07-09 (×3): qty 2

## 2020-07-09 MED ORDER — FLEET ENEMA 7-19 GM/118ML RE ENEM: 1.0000 | ENEMA | Freq: Once | RECTAL | Status: DC | PRN

## 2020-07-09 MED ORDER — IOHEXOL 350 MG/ML SOLN
100.0000 mL | Freq: Once | INTRAVENOUS | Status: AC | PRN
  Administered 2020-07-09: 100 mL via INTRAVENOUS

## 2020-07-09 MED ORDER — MORPHINE SULFATE (PF) 2 MG/ML IV SOLN
1.0000 mg | INTRAVENOUS | Status: DC | PRN
  Administered 2020-07-09 (×3): 2 mg via INTRAVENOUS
  Filled 2020-07-09 (×3): qty 1

## 2020-07-09 MED ORDER — ACETYLCYSTEINE 10 % IN SOLN
2.0000 mL | Freq: Once | RESPIRATORY_TRACT | Status: AC
  Administered 2020-07-10: 2 mL via RESPIRATORY_TRACT
  Filled 2020-07-09: qty 4

## 2020-07-09 NOTE — Progress Notes (Signed)
PHARMACY NOTE:  ANTIMICROBIAL RENAL DOSAGE ADJUSTMENT  Current antimicrobial regimen includes a mismatch between antimicrobial dosage and estimated renal function.  As per policy approved by the Pharmacy & Therapeutics and Medical Executive Committees, the antimicrobial dosage will be adjusted accordingly.  Current antimicrobial dosage:  cefepime 2 g IV q8h  Indication: pneumonia  Renal Function:  Estimated Creatinine Clearance: 56 mL/min (A) (by C-G formula based on SCr of 1.01 mg/dL (H)).     Antimicrobial dosage has been changed to:  cefepime 2 g IV q12h    Thank you for allowing pharmacy to be a part of this patient's care.  Dorena Bodo, PharmD 07/09/2020 3:21 PM

## 2020-07-09 NOTE — Progress Notes (Signed)
After receiving MSO4 IV, patients RR 36-47, down from 50-56. Patient nods head yes  when asked if she feels better. Through out this RN's shift, patient makes eye contact and inconsistently will follow commands. She will squeeze hand and wiggle toes to command, slow to respond. At times patient will vocalize incomprehensible words, at other times she will appear to move her mouth and attempt to speak, but no sounds come out. Patient will nod head to questions.

## 2020-07-09 NOTE — Progress Notes (Signed)
Arrived at bedside pt tachypneic respiratory rate upper 40's to 50 with rhonchi throughout and weak cough.  HIGH RISK for Aspiration.  Proceeded with nasotracheal suctioning x2 with moderate amount of thick tan secretions primarily in the upper airway respiratory rate decreased to the 30's. Pt able to follow some commands with no signs of agitation, however still not able to verbalize needs.  Will hold off on benzodiazepine administration for now.  Will continue to monitor and assess pt.    Rosilyn Mings, AGNP  Pulmonary/Critical Care Pager [phone] (please enter 7 digits) PCCM Consult Pager [phone] (please enter 7 digits)

## 2020-07-09 NOTE — Progress Notes (Signed)
RT at bedside placing pt on high flow nasal cannula at 7l to start. Pt is breathing very fast and doctor is aware and nurse. Dr stated that patient may get something to calm down for tonight.

## 2020-07-09 NOTE — Progress Notes (Signed)
Updated pt's son [place] via telephone.  Updated him on plan of care including:   -Obtaining CTA Chest due to tachypnea, hypoxia, low grade fever -Obtaining CT Abdomen/Pelvis due to increased WBC's in addition to stopping steroids -Obtaining Neuro consult for AMS   All questions answered.  He is very appreciative of update.   Darel Hong, AGACNP-BC [facility] Prefer epic messenger for cross cover needs If after hours, please call E-link

## 2020-07-09 NOTE — Progress Notes (Signed)
Dr. Herbert Deaner messaged about patient RR 40's-50's, HR 120's, PO2 >94%. Stated to let Hinton Dyer NP and Benjamine Mola NP aware. Hinton Dyer NP at bedside to assess patient. 0430-Patient RR now staying in the 43's, Benjamine Mola NP.

## 2020-07-09 NOTE — Consult Note (Signed)
[facility] Consult   Reason for Consult: Follow-up for this 65 year old woman in the ICU with altered mental status Referring Physician:  KASA Patient Identification: Tamara Mcclure MRN:  [id number] Principal Diagnosis: Subacute delirium Diagnosis:  Principal Problem:   Subacute delirium Active Problems:   Severe sepsis with septic shock ([place])   Acute respiratory failure (HCC)   Pleural effusion on right   Community acquired pneumonia   Total Time spent with patient: 30 minutes  Subjective:   Tamara Mcclure is a 65 y.o. female patient admitted with patient still not vocalizing.  HPI: Follow-up note for this 65 year old woman came in the hospital with altered mental status developed pneumonia and now extubated still with altered mental status.  Found patient in her room today eyes open but not responding.  Ask her multiple times to say her name or speak anything and she was not able to do it.  Would not follow any direct commands.  Did not even follow with her eyes to look at stimuli.  Patient did turn towards me but did not maintain eye contact.  Eyes darting around the room constantly.  Affect flat-ish but possibly anxious but no tone to it.  Nursing reports that patient has been essentially like this all day.  A couple of episodes of incoherent vocalizing.  Past Psychiatric History: Long history of heavy use of benzodiazepines  Risk to Self:   Risk to Others:   Prior Inpatient Therapy:   Prior Outpatient Therapy:    Past Medical History:  Past Medical History:  Diagnosis Date   Anxiety    Chronic lower back pain    COPD (chronic obstructive pulmonary disease) ([place])    Depression    Hypertension    Panic disorder with agoraphobia and severe panic attacks    Peripheral sensory neuropathy     Past Surgical History:  Procedure Laterality Date   I & D EXTREMITY Right 02/09/2017   Procedure: IRRIGATION AND DEBRIDEMENT EXTREMITY;  Surgeon: Lovell Sheehan, MD;   Location: [facility] ORS;  Service: Orthopedics;  Laterality: Right;   none     Family History:  Family History  Problem Relation Age of Onset   Cancer Mother        Lung   Cancer Father        pancreatic   Breast cancer Neg Hx    Ovarian cancer Neg Hx    Colon cancer Neg Hx    Diabetes Neg Hx    Heart disease Neg Hx    Family Psychiatric  History: See previous Social History:  Social History   Substance and Sexual Activity  Alcohol Use No     Social History   Substance and Sexual Activity  Drug Use Yes   Comment: Pt states prescribed oxy and xanax    Social History   Socioeconomic History   Marital status: Single    Spouse name: Not on file   Number of children: Not on file   Years of education: Not on file   Highest education level: Not on file  Occupational History   Not on file  Tobacco Use   Smoking status: Every Day    Packs/day: 1.00    Years: 30.00    Pack years: 30.00    Types: Cigarettes, E-cigarettes   Smokeless tobacco: Never  Vaping Use   Vaping Use: Every day  Substance and Sexual Activity   Alcohol use: No   Drug use: Yes    Comment: Pt  states prescribed oxy and xanax   Sexual activity: Not Currently  Other Topics Concern   Not on file  Social History Narrative   Not on file   Social Determinants of Health   Financial Resource Strain: Not on file  Food Insecurity: Not on file  Transportation Needs: Not on file  Physical Activity: Not on file  Stress: Not on file  Social Connections: Not on file   Additional Social History:    Allergies:   Allergies  Allergen Reactions   Penicillins Rash    Has patient had a PCN reaction causing immediate rash, facial/tongue/throat swelling, SOB or lightheadedness with hypotension: Yes Has patient had a PCN reaction causing severe rash involving mucus membranes or skin necrosis: No Has patient had a PCN reaction that required hospitalization: No Has patient had a PCN reaction occurring within the  last 10 years: No If all of the above answers are "NO", then may proceed with Cephalosporin use.    Labs:  Results for orders placed or performed during the hospital encounter of 07/08/2020 (from the past 48 hour(s))  Basic metabolic panel     Status: Abnormal   Collection Time: 07/08/20  4:41 AM  Result Value Ref Range   Sodium 138 135 - 145 mmol/L   Potassium 2.9 (L) 3.5 - 5.1 mmol/L   Chloride 97 (L) 98 - 111 mmol/L   CO2 32 22 - 32 mmol/L   Glucose, Bld 157 (H) 70 - 99 mg/dL    Comment: Glucose reference range applies only to samples taken after fasting for at least 8 hours.   BUN 25 (H) 8 - 23 mg/dL   Creatinine, Ser 0.66 0.44 - 1.00 mg/dL   Calcium 8.6 (L) 8.9 - 10.3 mg/dL   GFR, Estimated >60 >60 mL/min    Comment: (NOTE) Calculated using the CKD-EPI Creatinine Equation (2021)    Anion gap 9 5 - 15    Comment: Performed at [facility], [address] [place], [place] [address]  Magnesium     Status: None   Collection Time: 07/08/20  4:41 AM  Result Value Ref Range   Magnesium 2.2 1.7 - 2.4 mg/dL    Comment: Performed at [facility], [address] [place], [place] [address]  Phosphorus     Status: None   Collection Time: 07/08/20  4:41 AM  Result Value Ref Range   Phosphorus 3.6 2.5 - 4.6 mg/dL    Comment: Performed at [facility], [address] [place], [place] [address]  Basic metabolic panel     Status: Abnormal   Collection Time: 07/08/20  8:43 PM  Result Value Ref Range   Sodium 143 135 - 145 mmol/L   Potassium 4.0 3.5 - 5.1 mmol/L   Chloride 103 98 - 111 mmol/L   CO2 29 22 - 32 mmol/L   Glucose, Bld 155 (H) 70 - 99 mg/dL    Comment: Glucose reference range applies only to samples taken after fasting for at least 8 hours.   BUN 25 (H) 8 - 23 mg/dL   Creatinine, Ser 0.80 0.44 - 1.00 mg/dL   Calcium 9.1 8.9 - 10.3 mg/dL   GFR, Estimated >60 >60 mL/min    Comment: (NOTE) Calculated using the CKD-EPI Creatinine Equation  (2021)    Anion gap 11 5 - 15    Comment: Performed at [facility], [address], [place], [place] [address]  Magnesium     Status: Abnormal   Collection  Time: 07/08/20  8:43 PM  Result Value Ref Range   Magnesium 2.5 (H) 1.7 - 2.4 mg/dL    Comment: Performed at [facility], [address] [place], [place] [address]  Blood gas, arterial     Status: Abnormal (Preliminary result)   Collection Time: 07/08/20 11:19 PM  Result Value Ref Range   FIO2 PENDING    Delivery systems NASAL CANNULA    pH, Arterial 7.64 (HH) 7.350 - 7.450    Comment: CRITICAL RESULT CALLED TO, READ BACK BY AND VERIFIED WITH: MANSY,   CSM RRT  2325  07/08/20    pCO2 arterial 25 (L) 32.0 - 48.0 mmHg   pO2, Arterial 58 (L) 83.0 - 108.0 mmHg   Bicarbonate 26.9 20.0 - 28.0 mmol/L   Acid-Base Excess 7.0 (H) 0.0 - 2.0 mmol/L   O2 Saturation 94.7 %   Patient temperature 37.0    Collection site LEFT RADIAL    Sample type ARTERIAL DRAW    Allens test (pass/fail) PASS PASS    Comment: Performed at [facility], [address] [place], [place] [address]   Mechanical Rate PENDING   CBC     Status: Abnormal   Collection Time: 07/09/20  5:50 AM  Result Value Ref Range   WBC 47.4 (H) 4.0 - 10.5 K/uL   RBC 4.27 3.87 - 5.11 MIL/uL   Hemoglobin 13.0 12.0 - 15.0 g/dL   HCT 38.7 36.0 - 46.0 %   MCV 90.6 80.0 - 100.0 fL   MCH 30.4 26.0 - 34.0 pg   MCHC 33.6 30.0 - 36.0 g/dL   RDW 14.2 11.5 - 15.5 %   Platelets 425 (H) 150 - 400 K/uL   nRBC 0.0 0.0 - 0.2 %    Comment: Performed at [facility], [address] [place], [place] [address]  CBC with Differential/Platelet     Status: Abnormal   Collection Time: 07/09/20  8:50 AM  Result Value Ref Range   WBC 51.0 (HH) 4.0 - 10.5 K/uL    Comment:  RBV RUTH MICHAEL AT 0917 07/09/2020. GAA   RBC 4.13 3.87 - 5.11 MIL/uL   Hemoglobin 12.9 12.0 - 15.0 g/dL   HCT 38.1 36.0 - 46.0 %   MCV 92.3 80.0 - 100.0 fL   MCH 31.2 26.0 - 34.0 pg    MCHC 33.9 30.0 - 36.0 g/dL   RDW 45.9 (H) 11.5 - 15.5 %   Platelets 392 150 - 400 K/uL   Neutrophils Relative % 84 %   Neutro Abs 43.0 (H) 1.7 - 7.7 K/uL   Lymphocytes Relative 6 %   Lymphs Abs 2.9 0.7 - 4.0 K/uL   Monocytes Relative 7 %   Monocytes Absolute 3.6 (H) 0.1 - 1.0 K/uL   Eosinophils Relative 0 %   Eosinophils Absolute 0.0 0.0 - 0.5 K/uL   Basophils Relative 0 %   Basophils Absolute 0.0 0.0 - 0.1 K/uL   WBC Morphology MORPHOLOGY UNREMARKABLE    RBC Morphology POLYCHROMASIA PRESENT    Smear Review Reviewed    nRBC 0 0 /100 WBC    Comment: Performed at [facility], [address], [place],  [address]  Basic metabolic panel     Status: Abnormal   Collection Time: 07/09/20  8:50 AM  Result Value Ref Range   Sodium 147 (H) 135 - 145 mmol/L   Potassium 3.6 3.5 - 5.1 mmol/L   Chloride 107 98 - 111 mmol/L   CO2 27 22 -  32 mmol/L   Glucose, Bld 127 (H) 70 - 99 mg/dL    Comment: Glucose reference range applies only to samples taken after fasting for at least 8 hours.   BUN 41 (H) 8 - 23 mg/dL   Creatinine, Ser 1.01 (H) 0.44 - 1.00 mg/dL   Calcium 8.9 8.9 - 10.3 mg/dL   GFR, Estimated >60 >60 mL/min    Comment: (NOTE) Calculated using the CKD-EPI Creatinine Equation (2021)    Anion gap 13 5 - 15    Comment: Performed at [facility], [address] [place], [place] [address]  Procalcitonin - Baseline     Status: None   Collection Time: 07/09/20  8:50 AM  Result Value Ref Range   Procalcitonin 0.22 ng/mL    Comment:        Interpretation: PCT (Procalcitonin) <= 0.5 ng/mL: Systemic infection (sepsis) is not likely. Local bacterial infection is possible. (NOTE)       Sepsis PCT Algorithm           Lower Respiratory Tract                                      Infection PCT Algorithm    ----------------------------     ----------------------------         PCT < 0.25 ng/mL                PCT < 0.10 ng/mL          Strongly encourage              Strongly discourage   discontinuation of antibiotics    initiation of antibiotics    ----------------------------     -----------------------------       PCT 0.25 - 0.50 ng/mL            PCT 0.10 - 0.25 ng/mL               OR       >80% decrease in PCT            Discourage initiation of                                            antibiotics      Encourage discontinuation           of antibiotics    ----------------------------     -----------------------------         PCT >= 0.50 ng/mL              PCT 0.26 - 0.50 ng/mL               AND        <80% decrease in PCT             Encourage initiation of                                             antibiotics       Encourage continuation           of antibiotics    ----------------------------     -----------------------------        PCT >= 0.50  ng/mL                  PCT > 0.50 ng/mL               AND         increase in PCT                  Strongly encourage                                      initiation of antibiotics    Strongly encourage escalation           of antibiotics                                     -----------------------------                                           PCT <= 0.25 ng/mL                                                 OR                                        > 80% decrease in PCT                                      Discontinue / Do not initiate                                             antibiotics  Performed at [facility], [address] [place], [place] [address]   Pathologist smear review     Status: None   Collection Time: 07/09/20  8:50 AM  Result Value Ref Range   Path Review Blood smear is reviewed.     Comment: Morphology of RBCs, WBCs, and platelets within normal limits. Reviewed by Elmon Kirschner, M.D. Performed at [facility], [address] [place], [place] [address]   Glucose, capillary     Status: Abnormal   Collection Time: 07/09/20  4:25 PM   Result Value Ref Range   Glucose-Capillary 119 (H) 70 - 99 mg/dL    Comment: Glucose reference range applies only to samples taken after fasting for at least 8 hours.    Current Facility-Administered Medications  Medication Dose Route Frequency Provider Last Rate Last Admin   acetaminophen (TYLENOL) suppository 325 mg  325 mg Rectal Q4H PRN Fritzi Mandes, MD       acetaminophen (TYLENOL) tablet 650 mg  650 mg Oral Q6H PRN Fritzi Mandes, MD   650 mg at 07/09/20 1354   budesonide (PULMICORT) nebulizer solution 0.5 mg  0.5 mg Nebulization BID Milus Banister, NP   0.5 mg at 07/09/20 [phone]  ceFEPIme (MAXIPIME) 2 g in sodium chloride 0.9 % 100 mL IVPB  2 g Intravenous Q12H Fritzi Mandes, MD       Chlorhexidine Gluconate Cloth 2 % PADS 6 each  6 each Topical Daily Mannam, Praveen, MD   6 each at 07/09/20 0849   chlorpheniramine-HYDROcodone (TUSSIONEX) 10-8 MG/5ML suspension 5 mL  5 mL Oral Q12H PRN Mansy, Jan A, MD       docusate (COLACE) 50 MG/5ML liquid 100 mg  100 mg Oral BID PRN Fritzi Mandes, MD       feeding supplement (OSMOLITE 1.5 CAL) liquid 1,000 mL  1,000 mL Per Tube Continuous Flora Lipps, MD 45 mL/hr at 07/09/20 1354 1,000 mL at 07/09/20 1354   feeding supplement (PROSource TF) liquid 45 mL  45 mL Per Tube BID Flora Lipps, MD   45 mL at 07/09/20 1355   free water 200 mL  200 mL Per Tube Q4H Flora Lipps, MD   200 mL at 07/09/20 1355   guaiFENesin (MUCINEX) 12 hr tablet 600 mg  600 mg Oral BID Mansy, Jan A, MD       heparin injection 5,000 Units  5,000 Units Subcutaneous Q8H Mannam, Praveen, MD   5,000 Units at 07/09/20 1354   hydrALAZINE (APRESOLINE) injection 10 mg  10 mg Intravenous TID Fritzi Mandes, MD   10 mg at 07/08/20 2128   ipratropium-albuterol (DUONEB) 0.5-2.5 (3) MG/3ML nebulizer solution 3 mL  3 mL Nebulization Q6H PRN Graves, Raeford Razor, NP       ipratropium-albuterol (DUONEB) 0.5-2.5 (3) MG/3ML nebulizer solution 3 mL  3 mL Nebulization Q4H Darel Hong D, NP   3 mL at 07/09/20  1618   labetalol (NORMODYNE) injection 10 mg  10 mg Intravenous Q2H PRN Darel Hong D, NP   10 mg at 07/08/20 1227   linezolid (ZYVOX) IVPB 600 mg  600 mg Intravenous Q12H Jennye Boroughs, MD   Stopped at 07/09/20 1246   MEDLINE mouth rinse  15 mL Mouth Rinse BID Milus Banister, NP   15 mL at 07/08/20 2129   MEDLINE mouth rinse  15 mL Mouth Rinse BID Flora Lipps, MD   15 mL at 07/09/20 0849   morphine 2 MG/ML injection 1-2 mg  1-2 mg Intravenous Q4H PRN Darel Hong D, NP   2 mg at 07/09/20 1354   multivitamin liquid 15 mL  15 mL Oral Daily Fritzi Mandes, MD       pantoprazole (PROTONIX) injection 40 mg  40 mg Intravenous QHS Mannam, Praveen, MD   40 mg at 07/08/20 2127   polyethylene glycol (MIRALAX / GLYCOLAX) packet 17 g  17 g Oral Daily Vallery Sa D, RPH       polyethylene glycol (MIRALAX / GLYCOLAX) packet 17 g  17 g Oral Daily PRN Fritzi Mandes, MD       QUEtiapine (SEROQUEL) tablet 100 mg  100 mg Oral QHS Fritzi Mandes, MD       senna (SENOKOT) tablet 17.2 mg  2 tablet Oral QHS Dallie Piles, [facility]        Musculoskeletal: Strength & Muscle Tone:  To exam today the patient is not rigidly stiff but also not loose.  Not cogwheeling.  Overall probably normal tone Gait & Station: unable to stand Patient leans: N/A            Psychiatric Specialty Exam:  Presentation  General Appearance:  No data recorded Eye Contact: No data recorded Speech: No data recorded Speech Volume: No  data recorded Handedness: No data recorded  Mood and Affect  Mood: No data recorded Affect: No data recorded  Thought Process  Thought Processes: No data recorded Descriptions of Associations:No data recorded Orientation:No data recorded Thought Content:No data recorded History of Schizophrenia/Schizoaffective disorder:No data recorded Duration of Psychotic Symptoms:No data recorded Hallucinations:No data recorded Ideas of Reference:No data recorded Suicidal Thoughts:No data  recorded Homicidal Thoughts:No data recorded  Sensorium  Memory: No data recorded Judgment: No data recorded Insight: No data recorded  Executive Functions  Concentration: No data recorded Attention Span: No data recorded Recall: No data recorded Fund of Knowledge: No data recorded Language: No data recorded  Psychomotor Activity  Psychomotor Activity: No data recorded  Assets  Assets: No data recorded  Sleep  Sleep: No data recorded  Physical Exam: Physical Exam Vitals and nursing note reviewed.  Constitutional:      Appearance: Normal appearance. She is ill-appearing.  HENT:     Head: Normocephalic and atraumatic.     Mouth/Throat:     Pharynx: Oropharynx is clear.  Eyes:     Pupils: Pupils are equal, round, and reactive to light.  Cardiovascular:     Rate and Rhythm: Normal rate and regular rhythm.  Pulmonary:     Effort: Pulmonary effort is normal.     Breath sounds: Normal breath sounds.  Abdominal:     General: Abdomen is flat.     Palpations: Abdomen is soft.  Musculoskeletal:        General: Normal range of motion.  Skin:    General: Skin is warm and dry.  Neurological:     General: No focal deficit present.     Mental Status: She is alert. Mental status is at baseline.  Psychiatric:        Attention and Perception: She is inattentive.        Mood and Affect: Affect is blunt.        Speech: She is noncommunicative.        Behavior: Behavior is withdrawn.        Cognition and Memory: Cognition is impaired. Memory is impaired.   Review of Systems  Unable to perform ROS: Patient unresponsive  Blood pressure 92/73, pulse (!) 122, temperature 100 F (37.8 C), temperature source Axillary, resp. rate (!) 51, height 5\' 5"  (1.651 m), weight 70 kg, SpO2 100 %. Body mass index is 25.68 kg/m.  Treatment Plan Summary: Plan patient unchanged from yesterday.  Still with altered mental status eyes open but not responsive.  Differential diagnosis  includes catatonia, delirium from probably multiple factors including overdose and possibly the rare but conceivable option of status epilepticus.  I have been hoping that she would be improved since yesterday with conservative management.  I have ordered an EEG.  Reached out to neurology to make sure I did not contradict it but I would like to go ahead and order it now anyway.  This could possibly identify if she is having epileptiform activity and also can help with diagnosis of catatonia.  Once the EEG is done I am going to recommend a trial of IV benzodiazepines to see if there is any improvement in her condition.  Disposition:  See note  Alethia Berthold, MD 07/09/2020 4:52 PM

## 2020-07-09 NOTE — Progress Notes (Addendum)
NAME:  Tamara Mcclure, MRN:  [id number], DOB:  1955-03-10, LOS: 7 ADMISSION DATE:  07/05/2020, CONSULTATION DATE: 07/07/2020 REFERRING MD:  Lindell Noe MD, CHIEF COMPLAINT:   Acute respiratory failure, pneumonia  History of Present Illness:   65 year old with history of COPD, depression, hypertension, panic attacks presenting with altered mental status, acute on chronic respiratory failure.  Patient had a fall a few days ago, seen at local urgent care.  Has been taking increasing doses of opiate.  In the ED she failed Narcan and BiPAP and got intubated.  PCCM consulted for admission  Pertinent  Medical History    has a past medical history of Anxiety, Chronic lower back pain, COPD (chronic obstructive pulmonary disease) ([place]), Depression, Hypertension, Panic disorder with agoraphobia and severe panic attacks, and Peripheral sensory neuropathy.   Micro Data:  06/14/2020: SARS-CoV-2 and influenza PCR>> negative 07/09/2020: HIV>> nonreactive 06/13/2020: Strep pneumo urinary antigen>> negative 06/26/2020: Legionella urinary antigen>>negative 07/02/2021: Pleural fluid>> no growth 07/02/2021: Blood culture>> no growth 06/16/2020: MRSA PCR>> positive 06/22/2020: Tracheal aspirate>> Pseudomonas Aeruginosa & MRSA 07/02/2021: Urine>> no growth  Antimicrobials:  Cefepime 6/23>>  Ceftriaxone 6/24>> 6/25 Flagyl 6/23>> 6/27 Vancomycin 6/23>> 6/27 Linezolid 6/27>>  3 significant Hospital Events: Including procedures, antibiotic start and stop dates in addition to other pertinent events   6/23-admit, intubated, right chest tube placed, right IJ CVC placed 6/24: Weaning vent settings and Levophed (down to 8 mcg). Na+ corrected to 124 this morning (108 on admission) ~ D5 @ 75 started. Tracheal aspirate with Gram+ cocci in pairs & rare gram + rods ~ Cefepime changed to Ceftriaxone, Azithromycin d/c.  Pleural fluid consistent with EXUDATE ~ pleural fluid culture pending 6/26 remains on vent, critically  ill 6/27 Successfully extubated  6/28: Chest tube removed, remains with AMS, CT Head negative 6/29: Remains altered, concern for ? Catatonia vs. ? Osmotic Demyelination Syndrome (serum Na+ noted to have rapidly corrected during 1st 24 hrs of admission), Obtain MRI Brain,  Psych consulted 6/30: MRI Brain yesterday normal, tracking today (intermittently follows commands), obtain Neurology consult. WBC increased to 51 (23.6) ~ Peripheral Smear Normal, steroids d/c, consider Hematology consult; get CTA Chest and CT Abdomen/Pelvis  Interim History / Subjective:  -Overnight with Tachypnea (RR 40-50's)  and ABG w/ Respiratory Alkalosis~ received Nasotracheal suctioning with improvement -CXR read as "LLL collapse", but upon review of X-ray with Dr. Mortimer Fries do not see collapse and actually improved imaging -Orders placed for Chest PT, NT suctioning PRN, Bronchodilators -Afebrile, hemodynamically stable -WBC 51 (23.6 yesterday) ~ Pathologist Smear Review: Morphology of RBCs, WBCs, and platelets within normal limits. -Discussed with Dr. Mortimer Fries, steroids d/c and will see how WBC trends tomorrow, then consider Hematology consult -Will place NG tube for feeds and free water flushes as pt mentation not allowing for PO intake -MRI normal yesterday, will consult Neurology for assistance with AMS  Objective   Blood pressure 109/79, pulse (!) 119, temperature 99.9 F (37.7 C), temperature source Axillary, resp. rate (!) 45, height 5\' 5"  (1.651 m), weight 70 kg, SpO2 100 %.        Intake/Output Summary (Last 24 hours) at 07/09/2020 0910 Last data filed at 07/09/2020 0700 Gross per 24 hour  Intake 449.41 ml  Output 1300 ml  Net -850.59 ml    Filed Weights   07/05/20 0433 07/06/20 0500 07/08/20 0500  Weight: 72.1 kg 70.7 kg 70 kg    Examination:  Gen:   Acute on chronically ill-appearing female, sitting in bed, on HFNC,  with tachypnea but no overt distress HEENT: Atraumatic, normocephalic, EOMI, sclera  anicteric Neck:     No masses; no thyromegaly, no JVD Lungs:    Diminished Coarse breath sounds throughout, no wheezing, even, tachypnea but no accessory muscle use CV:         Tachycardia, Regular rhythm; S1 S2, no M/R/G, good peripheral circulation Abd:      + bowel sounds; soft, non-tender; no palpable masses, no distension Ext:    No edema; adequate peripheral perfusion Skin:      Warm and dry; no obvious rashes, lesions, ulcerations Neuro: Pt is awake, withdraws from pain to all extremities, tracking today, reports that will intermittently follow commands,  pupils PERRLA Psych: Unable to assess due to AMS  Labs/imaging that I havepersonally reviewed  (right click and "Reselect all SmartList Selections" daily)   Labs 07/09/2020: WBC 52, Procalcitonin 0.22, Na 147, glucose 127, BUN 41, Cr. 1.01, Pathologist Smear Review: "Morphology of RBCs, WBCs, and platelets within normal limits."  MRI Brain 6/29>>1. No acute intracranial abnormality identified on this motion degraded study. 2. Sinusitis. 3. 1.1 cm nodule along the right parotid tail, indeterminate though may reflect a lymph node or small parotid neoplasm. CT Head 6/28>>No acute abnormality. No evidence of cerebral edema or infarction. No intracranial hemorrhage CT Chest w/o contrast 6/28>>IMPRESSION: 1. Largely resolved right pleural effusion with pleural drain in place. Mild or trace residual fluid and/or thickening in the right costophrenic angle. 2. Small volume retained secretions in the airways. No convincing pneumonia. Emphysema (ICD10-J43.9). 3. Aortic Atherosclerosis (ICD10-I70.0). CT head 6/23>>1. No acute intracranial abnormalities. 2. Chronic sinus inflammation. CT cervical spine 6/23>>No evidence for cervical spine fracture.  Advanced cervical spondylosis CT abdomen pelvis 6/23>>1. No definite CT evidence of acute traumatic injury to the chest, abdomen, or pelvis. 2. Large right pleural effusion and associated  atelectasis or consolidation. No displaced rib fracture or other evident etiology. 3. There are wedge deformities of the T7 and T8 vertebral bodies, proximally 30% anterior height loss, which are new compared to most recent imaging of the chest dated 09/29/2014 but age indeterminate. Correlate for acute point tenderness. 4. Emphysema. 5. Coronary artery disease.  Resolved Hospital Problem list     Assessment & Plan:   Acute on chronic respiratory failure due to COPD exacerbation, right lower lobe pneumonia (Pseudomonas & MRSA), & large Right Pleural Effusion -Successfully extubated 07/06/20 -Supplemental O2 as needed to maintain O2 sats 88 to 92% -Follow intermittent chest x-ray and ABG as needed -Bronchodilators  -D/c Steroids due to severe Leukocytosis -Chest PT, Nasotracheal suctioning as needed -Obtain CTA Chest to r/o Pulmonary Embolism -Chest tube placement on 06/16/2020 ~ removed on 07/07/20 -Pleural fluid consistent with EXUDATE (Lights Criteria: Pleural LDH  979 is > 2/3 UNL of serum LDH (112) -Pleural fluid culture with no growth ~ consistent with PARAPNEUMONIC Effusion  Septic Shock>>resolved -Continuous cardiac monitoring -Maintain MAP greater than 65 -Lactic acid normalized -Echocardiogram on 06/25/2020 with LVEF 60 to 00%, grade 1 diastolic dysfunction, RV systolic function normal  Sepsis present on admission secondary to Pseudomonas & MRSA Pneumonia Severe Leukocytosis -Monitor fever curve -Trend WBCs and procalcitonin -Follow cultures as above -Continue Cefepime & Zyvox -Peripheral smear on 6/30 with Morphology of RBCs, WBCs, and platelets within normal limits. -Discussed with Dr. Mortimer Fries ~ d/c Solumedrol and will trend [facility] tomorrow ~ consider Hematology consult -Will obtain CT Abdomen/Pelvis  Hypokalemia>>resolved Mild Hypernatremia due to poor PO intake -Monitor I&O's / urinary output -Follow BMP  -Ensure adequate  renal perfusion -Avoid nephrotoxic agents  as able -Replace electrolytes as indicated -Pharmacy following for assistance with electrolyte replacement -Will place NG tube and start free water flushes/feeds  Acute Metabolic Encephalopathy secondary to sepsis and Hyponatremia Concern for ? Catatonia vs. Lingering effects of overdose on multiple sedating medications PMHx of Chronic opiate abuse -Frequent Neuro checks -Provide supportive care -Avoid sedating meds as able -CT head 07/07/2020 negative for acute intracranial abnormality -Psychiatry following, appreciate input -MRI Brain 07/08/20 negative for acute intracranial abnormality -Consult Neurology, appreciate input  Best Practice (right click and "Reselect all SmartList Selections" daily)   Diet/type: NPO, start tube feeds Pain/Anxiety/Delirium protocol N/A VAP protocol (if indicated): N/A DVT prophylaxis: prophylactic heparin  GI prophylaxis: PPI Glucose control:  yes, SSI Central venous access:  yes, and is still indicated Arterial line:  N/A Foley:  N/A Mobility:  bed rest  PT consulted: N/A Studies pending: CTA Chest, CT Abdomen/Pelvis Culture data pending: N/A Last reviewed culture data:today Antibiotics: Cefepime, Zvyox Antibiotic de-escalation:  N/A, on appropriate ABX Stop date: to be determined  Daily labs: requested Code Status:  full code Last date of multidisciplinary goals of care discussion [07/09/20] Disposition: remains critically ill, will stay in intensive care     Labs   CBC: Recent Labs  Lab 06/19/2020 0947 07/03/20 0442 07/05/20 0520 07/07/20 0200 07/09/20 0550  WBC 20.7* 19.1* 17.2* 23.6* 47.4*  NEUTROABS 15.1*  --   --   --   --   HGB 10.6* 9.8* 9.6* 10.3* 13.0  HCT 29.2* 27.4* 28.0* 29.5* 38.7  MCV 85.6 86.2 90.3 88.3 90.6  PLT 296 341 326 305 425*     Basic Metabolic Panel: Recent Labs  Lab 07/04/20 0544 07/04/20 0957 07/05/20 0520 07/05/20 1130 07/06/20 0520 07/06/20 0813 07/07/20 0200 07/08/20 0441 07/08/20 2043   NA 130*   < > 133*   < > 135 135 137 138 143  K 3.7  --  4.2  --  3.8  --  2.8* 2.9* 4.0  CL 98  --  100  --  103  --  94* 97* 103  CO2 25  --  26  --  27  --  32 32 29  GLUCOSE 158*  --  162*  --  128*  --  103* 157* 155*  BUN 11  --  16  --  22  --  21 25* 25*  CREATININE 0.61  --  0.61  --  0.55  --  0.50 0.66 0.80  CALCIUM 8.4*  --  8.3*  --  8.0*  --  8.6* 8.6* 9.1  MG 2.0  --  2.0  --  1.8  --  2.0 2.2 2.5*  PHOS 3.2  --  3.1  --  2.2*  --  1.7* 3.6  --    < > = values in this interval not displayed.    GFR: Estimated Creatinine Clearance: 70.7 mL/min (by C-G formula based on SCr of 0.8 mg/dL). Recent Labs  Lab 07/03/2020 0947 06/17/2020 1325 07/03/20 0442 07/05/20 0520 07/07/20 0200 07/09/20 0550  PROCALCITON  --  0.10  --   --   --   --   WBC 20.7*  --  19.1* 17.2* 23.6* 47.4*  LATICACIDVEN 0.6 1.1  --   --   --   --      Liver Function Tests: Recent Labs  Lab 06/16/2020 0947 06/28/2020 1804 07/03/20 0442  AST 39  --  23  ALT 29  --  23  ALKPHOS 226*  --  184*  BILITOT 0.9  --  0.7  PROT 7.0 6.3* 6.6  ALBUMIN 3.3*  --  3.0*    No results for input(s): LIPASE, AMYLASE in the last 168 hours. No results for input(s): AMMONIA in the last 168 hours.  ABG    Component Value Date/Time   PHART 7.64 (HH) 07/08/2020 2319   PCO2ART 25 (L) 07/08/2020 2319   PO2ART 58 (L) 07/08/2020 2319   HCO3 26.9 07/08/2020 2319   ACIDBASEDEF 5.3 (H) 06/13/2020 1115   O2SAT 94.7 07/08/2020 2319      Coagulation Profile: Recent Labs  Lab 06/12/2020 1325  INR 1.7*     Cardiac Enzymes: No results for input(s): CKTOTAL, CKMB, CKMBINDEX, TROPONINI in the last 168 hours.  HbA1C: Hgb A1c MFr Bld  Date/Time Value Ref Range Status  06/25/2020 01:25 PM 5.8 (H) 4.8 - 5.6 % Final    Comment:    (NOTE)         Prediabetes: 5.7 - 6.4         Diabetes: >6.4         Glycemic control for adults with diabetes: <7.0   03/11/2013 12:00 AM 5.8 4.0 - 6.0 % Final    CBG: Recent Labs   Lab 07/06/20 0007 07/06/20 0412 07/06/20 0721 07/06/20 1103 07/07/20 0330  GLUCAP 137* 127* 113* 114* 89     Review of Systems:   Unable to obtain due to altered mental status  Past Medical History:  She,  has a past medical history of Anxiety, Chronic lower back pain, COPD (chronic obstructive pulmonary disease) ([place]), Depression, Hypertension, Panic disorder with agoraphobia and severe panic attacks, and Peripheral sensory neuropathy.   Surgical History:   Past Surgical History:  Procedure Laterality Date   I & D EXTREMITY Right 02/09/2017   Procedure: IRRIGATION AND DEBRIDEMENT EXTREMITY;  Surgeon: Lovell Sheehan, MD;  Location: [facility] ORS;  Service: Orthopedics;  Laterality: Right;   none       Social History:   reports that she has been smoking cigarettes and e-cigarettes. She has a 30.00 pack-year smoking history. She has never used smokeless tobacco. She reports current drug use. She reports that she does not drink alcohol.   Family History:  Her family history includes Cancer in her father and mother. There is no history of Breast cancer, Ovarian cancer, Colon cancer, Diabetes, or Heart disease.   Allergies Allergies  Allergen Reactions   Penicillins Rash    Has patient had a PCN reaction causing immediate rash, facial/tongue/throat swelling, SOB or lightheadedness with hypotension: Yes Has patient had a PCN reaction causing severe rash involving mucus membranes or skin necrosis: No Has patient had a PCN reaction that required hospitalization: No Has patient had a PCN reaction occurring within the last 10 years: No If all of the above answers are "NO", then may proceed with Cephalosporin use.     Home Medications  Prior to Admission medications   Medication Sig Start Date End Date Taking? Authorizing Provider  albuterol (PROVENTIL HFA;VENTOLIN HFA) 108 (90 Base) MCG/ACT inhaler Inhale 2 puffs into the lungs every 6 (six) hours as needed for wheezing or shortness  of breath. 11/16/15   Roselee Nova, MD  alprazolam Duanne Moron) 2 MG tablet Take 1 tablet (2 mg total) by mouth 3 (three) times daily as needed for anxiety. 01/12/17   Roselee Nova, MD  carisoprodol (SOMA) 350 MG tablet Take 1 tablet (350 mg  total) by mouth 3 (three) times daily. 01/12/17   Roselee Nova, MD  COMBIVENT RESPIMAT 20-100 MCG/ACT AERS respimat INHALE 1 PUFF INTO THE LUNGS EVERY 6 HOURS AS NEEDED FOR WHEEZING OR SHORTNESS OF BREATH 02/15/17   Rochel Brome A, MD  fluticasone furoate-vilanterol (BREO ELLIPTA) 100-25 MCG/INH AEPB Inhale 1 puff into the lungs daily. 11/26/15   Roselee Nova, MD  ibuprofen (ADVIL,MOTRIN) 200 MG tablet Take 200 mg by mouth every 6 (six) hours as needed for mild pain.     [provider]  potassium chloride (K-DUR) 10 MEQ tablet TAKE 1 TABLET BY MOUTH ONCE A DAY 07/16/15   Roselee Nova, MD  pregabalin (LYRICA) 75 MG capsule Take 1 capsule (75 mg total) by mouth 3 (three) times daily. 01/12/17   Roselee Nova, MD  QUEtiapine (SEROQUEL) 100 MG tablet Take 1 tablet (100 mg total) by mouth at bedtime. 12/13/16   Roselee Nova, MD    Critical care time: 42 minutes   The patient is critically ill with multiple organ system failure and requires high complexity decision making for assessment and support, frequent evaluation and titration of therapies, advanced monitoring, review of radiographic studies and interpretation of complex data.   Darel Hong, AGACNP-BC [facility] Prefer epic messenger for cross cover needs If after hours, please call E-link

## 2020-07-09 NOTE — Progress Notes (Signed)
Nutrition Services Brief Note  Discussed pt in  ICU rounds. Pt's mentation inhibits her ability to take in POs safely. Phychiatric and Neurology consulting for AMS. CCM to place NGT today as Na is trending up and pt is unable to take in adequate nutrition. TF requested.   Estimated Nutritional Needs:  Kcal:  1500-1700 kcal/d Protein:  80-95 g/d Fluid:  >1.6L/d  Recommend the following TF regimen once NGT in place: Osmolite 1.5 at 47mL/h, 2 packets of prosource TF daily (40kcal and 11g of protein per packet) Free water flush: 26mL q4h TF regimen provides 1700kcal, 90g of protein, and 2019mL of free water (flushes +TF)  Ranell Patrick, RD, LDN Clinical Dietitian Pager on Amion

## 2020-07-09 NOTE — Progress Notes (Signed)
[name] NAME: Tamara Mcclure    MR#:  [id number]  DATE OF BIRTH:  10/15/1955  SUBJECTIVE:  no family in the room.  Having low-grade fever. Tachycardic tachypnea. Sitter in the room. Does not hold any meaningful conversation. Moans and tracks with eyes when move around. REVIEW OF SYSTEMS:   Review of Systems  Unable to perform ROS: Mental acuity   DRUG ALLERGIES:   Allergies  Allergen Reactions  . Penicillins Rash    Has patient had a PCN reaction causing immediate rash, facial/tongue/throat swelling, SOB or lightheadedness with hypotension: Yes Has patient had a PCN reaction causing severe rash involving mucus membranes or skin necrosis: No Has patient had a PCN reaction that required hospitalization: No Has patient had a PCN reaction occurring within the last 10 years: No If all of the above answers are "NO", then may proceed with Cephalosporin use.    VITALS:  Blood pressure 105/76, pulse (!) 122, temperature 100 F (37.8 C), temperature source Axillary, resp. rate (!) 50, height 5\' 5"  (1.651 m), weight 70 kg, SpO2 99 %.  PHYSICAL EXAMINATION:   Physical Exam exam secondary to patient's mental acuity  GENERAL:  65 y.o.-year-old patient lying in the bed with no acute distress.  LUNGS: Normal breath sounds bilaterally, no wheezing, rales, rhonchi. No use of accessory muscles of respiration. tachypneic CARDIOVASCULAR: S1, S2 normal. No murmurs, rubs, or gallops. tachy ABDOMEN: Soft, nontender, nondistended. Bowel sounds present. No organomegaly or mass.  EXTREMITIES: No cyanosis, clubbing or edema b/l.    NEUROLOGIC: nonfocal. Tracks with her eyes. Nonverbal PSYCHIATRIC:  patient is awake, nonverbal SKIN: No obvious rash, lesion, or ulcer.   LABORATORY PANEL:  CBC Recent Labs  Lab 07/09/20 0850  WBC 51.0*  HGB 12.9  HCT 38.1  PLT 392     Chemistries  Recent Labs  Lab 07/03/20 0442 07/03/20 1100  07/08/20 2043 07/09/20 0850  NA 124*   < > 143 147*  K 4.2   < > 4.0 3.6  CL 91*   < > 103 107  CO2 24   < > 29 27  GLUCOSE 136*   < > 155* 127*  BUN 15   < > 25* 41*  CREATININE 0.84   < > 0.80 1.01*  CALCIUM 8.5*   < > 9.1 8.9  MG 1.7   < > 2.5*  --   AST 23  --   --   --   ALT 23  --   --   --   ALKPHOS 184*  --   --   --   BILITOT 0.7  --   --   --    < > = values in this interval not displayed.    Cardiac Enzymes No results for input(s): TROPONINI in the last 168 hours. RADIOLOGY:  DG Chest 1 View  Result Date: 07/08/2020 CLINICAL DATA:  Dyspnea EXAM: CHEST  1 VIEW COMPARISON:  07/08/2020, 07/06/2020, 07/05/2020 FINDINGS: Right-sided central venous catheter tip over the SVC. Airspace disease at the left lung base. Stable cardiomediastinal silhouette. No pneumothorax. IMPRESSION: Increased airspace disease at left base since radiograph earlier today, could be secondary to atelectasis or aspiration. Electronically Signed   By: Donavan Foil M.D.   On: 07/08/2020 21:00   DG Abd 1 View  Result Date: 07/09/2020 CLINICAL DATA:  NG tube placement. EXAM: ABDOMEN - 1 VIEW COMPARISON:  06/30/2020. FINDINGS: Right IJ line noted with tip  over SVC. NG tube is been advanced. NG tube noted with tip over the stomach. Stool noted throughout the colon. Mild bibasilar atelectasis. IMPRESSION: NG tube is been advanced. NG tube noted with tip over the distal stomach in good anatomic location. Electronically Signed   By: Marcello Moores  Register   On: 07/09/2020 12:57   MR BRAIN WO CONTRAST  Result Date: 07/08/2020 CLINICAL DATA:  Mental status change. EXAM: MRI HEAD WITHOUT CONTRAST TECHNIQUE: Multiplanar, multiecho pulse sequences of the brain and surrounding structures were obtained without intravenous contrast. COMPARISON:  Head CT 07/07/2020 FINDINGS: Some sequences are moderately to severely motion degraded despite use of faster, more motion resistant imaging protocols and attempts at repeat imaging.  Brain: There is no evidence of an acute infarct, intracranial hemorrhage, mass, midline shift, or extra-axial fluid collection. The ventricles and sulci are within normal limits for age. A subcentimeter T2 hyperintensity at the posterior aspect of the right lentiform nucleus may reflect a chronic lacunar infarct or dilated perivascular space. No significant white matter disease is evident elsewhere for age within limitations of motion. Vascular: Major intracranial vascular flow voids are preserved. Skull and upper cervical spine: No gross skull lesion. Sinuses/Orbits: Grossly unremarkable orbits. Subtotal opacification of the maxillary sinuses by combination of fluid and mucosal thickening. Moderate volume fluid in the left sphenoid sinus. Mild mucosal thickening in the frontal and ethmoid sinuses. Small left mastoid effusion. Other: 1.1 cm T2 hypointense nodule along the posterior margin of the right parotid tail. IMPRESSION: 1. No acute intracranial abnormality identified on this motion degraded study. 2. Sinusitis. 3. 1.1 cm nodule along the right parotid tail, indeterminate though may reflect a lymph node or small parotid neoplasm. Electronically Signed   By: Logan Bores M.D.   On: 07/08/2020 14:19   DG Chest Port 1 View  Result Date: 07/09/2020 CLINICAL DATA:  Acute respiratory failure EXAM: PORTABLE CHEST 1 VIEW COMPARISON:  07/08/2020 FINDINGS: There is left lower lobe collapse with are lucency of the left hemithorax secondary to hyperinflation of the left upper lobe. Resultant left-sided volume loss. Coarsening of the interstitium within the lung apices is in keeping with moderate emphysema better appreciated on prior CT examination of 07/07/2020. No pneumothorax or pleural effusion. Cardiac size within normal limits. Right internal jugular central venous catheter noted within the superior vena cava. Degenerative changes are seen within the left shoulder. IMPRESSION: Interval left lower lobe collapse.  Moderate emphysema. Electronically Signed   By: Fidela Salisbury MD   On: 07/09/2020 04:51   DG Chest Port 1 View  Result Date: 07/08/2020 CLINICAL DATA:  Acute respiratory failure EXAM: PORTABLE CHEST 1 VIEW COMPARISON:  07/06/2020 FINDINGS: Endotracheal tube and nasogastric tube have been removed. Pulmonary insufflation is stable. Minimal left basilar atelectasis. Previously noted left pleural effusion has resolved. No pneumothorax or pleural effusion. Right internal jugular central venous catheter is unchanged with its tip at the superior vena cava. Cardiac size within normal limits. Pulmonary vascularity is normal. IMPRESSION: Interval extubation with stable pulmonary hypoinflation. Resolved left pleural effusion. Electronically Signed   By: Fidela Salisbury MD   On: 07/08/2020 03:56   ASSESSMENT AND PLAN:   Tamara Mcclure is a 65 y.o. female with medical history significant for COPD, depression, hypertension, anxiety with panic attacks, chronic low back pain, peripheral neuropathy, fall few days prior to admission for which she was taking opioids, who was brought to the hospital for increasing shortness of breath, altered mental status and hypoxia.  Reportedly, her oxygen saturation  was in the 70s on nonrebreather mask when EMS arrived.  Acute hypoxic respiratory failure secondary to septic shock with MRSA/Pseudomonas pneumonia Leucocytosis --- IV linezolid and cefepime -- continue wean down oxygen. Currently on nasal cannula -- sepsis resolved --6/30--CXR ?left LL collapse with emphysema -- cont Chest PT for possible mucous plugging --remains tachycardic/tachypneic -wbc count trending up 23K--47K--51K  COPD exacerbation -- continue steroids and PRN bronchodilators  right pleural effusion chest status post right chest replacement-- now removed  acute metabolic encephalopathy/multifactorial/critical illness versus catatonia -- seen by psychiatry Dr. Weber Cooks-- will consider trial of Ativan if  no improvement -- CT head/MRI brain no acute abnormality -- avoid benzodiazepines and narcotics -- patient currently not cooperating with PO diet due to change in mentation --Neurology consult pending. Dr Cheral Marker notified by secure chat  Dysphagia -- speech therapy recommends dysphagia-1 diet  chronic depression and panic disorder -- was on Xanax before. -- currently discontinued benzodiazepines   Procedures: Intubation and mechanical ventilation on 06/16/2020--now extubated Right chest tube 06/30/2020--removed 07/06/2020 Right IJ central line placed on 07/04/2020>>>present Foley--removed 07/08/2020  Family communication :spoke with son derrick Berntsen 6/29 Consults : psychiatry, ICU CODE STATUS: full DVT Prophylaxis : heparin Level of care: ICU Status is: Inpatient  Remains inpatient appropriate because:Inpatient level of care appropriate due to severity of illness  Dispo: The patient is from: Home              Anticipated d/c is to:  TBD              Patient currently is not medically stable to d/c.   Difficult to place patient No  Poor prognosis Consider palliative care      TOTAL TIME TAKING CARE OF THIS PATIENT: 35 minutes.  >50% time spent on counselling and coordination of care  Note: This dictation was prepared with Dragon dictation along with smaller phrase technology. Any transcriptional errors that result from this process are unintentional.  Fritzi Mandes M.D    [facility]   CC: Primary care physician; Roselee Nova, MD Patient ID: Tamara Mcclure, female   DOB: Jan 23, 1955, 65 y.o.   MRN: [id number]

## 2020-07-09 NOTE — Consult Note (Signed)
NEURO HOSPITALIST CONSULT NOTE   Requestig physician: Dr. Posey Pronto  Reason for Consult: AMS  History obtained from:  Chart     HPI:                                                                                                                                          Tamara Mcclure is an 65 y.o. female with a PMHx of COPD, reported opiate abuse, depression, peripheral neuropathy, HTN, anxiety and panic attacks who who presented to the hospital on 6/23 with SOB. EMS had initially been called to her home for AMS with hypoxia. She was satting in the 70s on NRB when they arrived. She was placed on BiPAP with improvement to the low 80s. Due to drowsiness, she was given Narcan with improvement in her mental status. Of note, she had fallen several days previously, and due to pain had been taking increasing doses of her opiate pain medications.   Due to intolerance of BiPAP while in the ED, decision was made to intubate the patient. Xray showed findings concerning for a right sided PNA with concern for possible aspiration versus CAP. She was hypertensive and tachycardic with frothy pulmonary secretions. COPD exacerbation with PNA or CHF with asymmetric edema were also on the DDx. She was started on broad spectrum ABX. Versed sedation was started due to patient bucking the vent. Right chest tube was place for pleural effusion and later removed. She was extubated on 6/27 and transferred to step-down. On follow up exam by Hospitalist on 6/28 she was confused and nonverbal, but nonfocal.   In the evening on 6/28, RN noted that during day shift, the patient had worsened from slightly communicating to not communicating at all or making eye contact. Of note, patient had been sedated for repeat head CT, which showed no change. This morning, she continued to remain altered, with concern for catatonia versus osmotic demyelination syndrome (serum Na+ noted to have rapidly corrected during 1st 24 hrs of  admission). Psychiatry was consulted and they noted her to be in an eyes-open unresponsive state, but did not feel strongly that her AMS was due to catatonia as exam also revealed no waxy flexibility or any of the stereotyped movements often seen in catatonia. She has been afebrile and hemodynamically stable. Also was noted that she does not have a known past history of psychotic disorder or major mood disorder or previous catatonic spells, in the setting of her chronic depression and anxiety. More likely diagnosis per Psychiatry is probably the lingering effects of an overdose of multiple sedating medications. Psychiatry recommended limiting sedating medications except for possible Ativan trial for catatonia.  Per RN, she is now slightly better towards the end of today's day shift, able to nod head yes to a  question, follow some simple commands such as squeezing hand/wiggling toes and make eye contact. At times patient will vocalize incomprehensible words, at other times she will appear to move her mouth and attempt to speak, but no sounds come out. Low-grade fever was noted today. Psychiatry notes a long history of benzo overuse in their note today.   MRI brain obtained today shows no acute abnormality.   MRI brain: 1. No acute intracranial abnormality identified on this motion degraded study. 2. Sinusitis. 3. 1.1 cm nodule along the right parotid tail, indeterminate though may reflect a lymph node or small parotid neoplasm.  Past Medical History:  Diagnosis Date   Anxiety    Chronic lower back pain    COPD (chronic obstructive pulmonary disease) (HCC)    Depression    Hypertension    Panic disorder with agoraphobia and severe panic attacks    Peripheral sensory neuropathy     Past Surgical History:  Procedure Laterality Date   I & D EXTREMITY Right 02/09/2017   Procedure: IRRIGATION AND DEBRIDEMENT EXTREMITY;  Surgeon: Lovell Sheehan, MD;  Location: [facility] ORS;  Service: Orthopedics;   Laterality: Right;   none      Family History  Problem Relation Age of Onset   Cancer Mother        Lung   Cancer Father        pancreatic   Breast cancer Neg Hx    Ovarian cancer Neg Hx    Colon cancer Neg Hx    Diabetes Neg Hx    Heart disease Neg Hx            Social History:  reports that she has been smoking cigarettes and e-cigarettes. She has a 30.00 pack-year smoking history. She has never used smokeless tobacco. She reports current drug use. She reports that she does not drink alcohol.  Allergies  Allergen Reactions   Penicillins Rash    Has patient had a PCN reaction causing immediate rash, facial/tongue/throat swelling, SOB or lightheadedness with hypotension: Yes Has patient had a PCN reaction causing severe rash involving mucus membranes or skin necrosis: No Has patient had a PCN reaction that required hospitalization: No Has patient had a PCN reaction occurring within the last 10 years: No If all of the above answers are "NO", then may proceed with Cephalosporin use.    MEDICATIONS:                                                                                                                     Scheduled:  budesonide (PULMICORT) nebulizer solution  0.5 mg Nebulization BID   Chlorhexidine Gluconate Cloth  6 each Topical Daily   feeding supplement (PROSource TF)  45 mL Per Tube BID   free water  200 mL Per Tube Q4H   guaiFENesin  600 mg Oral BID   heparin  5,000 Units Subcutaneous Q8H   hydrALAZINE  10 mg Intravenous TID   ipratropium-albuterol  3 mL Nebulization Q4H   mouth  rinse  15 mL Mouth Rinse BID   mouth rinse  15 mL Mouth Rinse BID   multivitamin  15 mL Oral Daily   pantoprazole (PROTONIX) IV  40 mg Intravenous QHS   polyethylene glycol  17 g Oral Daily   QUEtiapine  100 mg Oral QHS   senna  2 tablet Oral QHS   vancomycin  500 mg Per Tube Q6H   Continuous:  ceFEPime (MAXIPIME) IV     feeding supplement (OSMOLITE 1.5 CAL) 1,000 mL (07/09/20 1354)    linezolid (ZYVOX) IV Stopped (07/09/20 1246)     ROS:                                                                                                                                       Unable to obtain due to AMS.    Blood pressure 105/76, pulse (!) 122, temperature 100 F (37.8 C), temperature source Axillary, resp. rate (!) 50, height 5\' 5"  (1.651 m), weight 70 kg, SpO2 99 %.   General Examination:                                                                                                       Physical Exam  HEENT-  [place]/AT. No nuchal rigidity.    Lungs- Respirations unlabored Extremities- Warm and well perfused  Neurological Examination Mental Status: Awake but with decreased responsiveness to questions and commands. Mildly agitated . Nonverbal. Not following commands but does visually track spontaneously.  Cranial Nerves: II: PERRL. Intermittently tracks examiner.  III,IV, VI: Saccadic visual pursuits are full horizontally.  VII: Face is symmetric.  VIII: Opens eyes to voice IX,X: Unable to assess XI: No asymmetry XII: Does not follow commands for testing.  Motor/Sensory: Moves extremities equally to noxious. Unable to formally assess strength.  Deep Tendon Reflexes: Depressed patellar reflexes. 1+ brachioradialis reflexes.  Plantars: Right: downgoing   Left: downgoing Cerebellar/Gait: Unable to assess    Lab Results: Basic Metabolic Panel: Recent Labs  Lab 07/04/20 0544 07/04/20 0957 07/05/20 0520 07/05/20 1130 07/06/20 0520 07/06/20 0813 07/07/20 0200 07/08/20 0441 07/08/20 2043 07/09/20 0850  NA 130*   < > 133*   < > 135 135 137 138 143 147*  K 3.7  --  4.2  --  3.8  --  2.8* 2.9* 4.0 3.6  CL 98  --  100  --  103  --  94* 97* 103 107  CO2 25  --  26  --  27  --  32 32 29 27  GLUCOSE 158*  --  162*  --  128*  --  103* 157* 155* 127*  BUN 11  --  16  --  22  --  21 25* 25* 41*  CREATININE 0.61  --  0.61  --  0.55  --  0.50 0.66 0.80 1.01*   CALCIUM 8.4*  --  8.3*  --  8.0*  --  8.6* 8.6* 9.1 8.9  MG 2.0  --  2.0  --  1.8  --  2.0 2.2 2.5*  --   PHOS 3.2  --  3.1  --  2.2*  --  1.7* 3.6  --   --    < > = values in this interval not displayed.    CBC: Recent Labs  Lab 07/03/20 0442 07/05/20 0520 07/07/20 0200 07/09/20 0550 07/09/20 0850  WBC 19.1* 17.2* 23.6* 47.4* 51.0*  NEUTROABS  --   --   --   --  43.0*  HGB 9.8* 9.6* 10.3* 13.0 12.9  HCT 27.4* 28.0* 29.5* 38.7 38.1  MCV 86.2 90.3 88.3 90.6 92.3  PLT 341 326 305 425* 392    Cardiac Enzymes: No results for input(s): CKTOTAL, CKMB, CKMBINDEX, TROPONINI in the last 168 hours.  Lipid Panel: Recent Labs  Lab 07/03/20 0442 07/06/20 0520  TRIG 47 70    Imaging: DG Chest 1 View  Result Date: 07/08/2020 CLINICAL DATA:  Dyspnea EXAM: CHEST  1 VIEW COMPARISON:  07/08/2020, 07/06/2020, 07/05/2020 FINDINGS: Right-sided central venous catheter tip over the SVC. Airspace disease at the left lung base. Stable cardiomediastinal silhouette. No pneumothorax. IMPRESSION: Increased airspace disease at left base since radiograph earlier today, could be secondary to atelectasis or aspiration. Electronically Signed   By: Donavan Foil M.D.   On: 07/08/2020 21:00   DG Abd 1 View  Result Date: 07/09/2020 CLINICAL DATA:  NG tube placement. EXAM: ABDOMEN - 1 VIEW COMPARISON:  07/01/2020. FINDINGS: Right IJ line noted with tip over SVC. NG tube is been advanced. NG tube noted with tip over the stomach. Stool noted throughout the colon. Mild bibasilar atelectasis. IMPRESSION: NG tube is been advanced. NG tube noted with tip over the distal stomach in good anatomic location. Electronically Signed   By: Marcello Moores  Register   On: 07/09/2020 12:57   MR BRAIN WO CONTRAST  Result Date: 07/08/2020 CLINICAL DATA:  Mental status change. EXAM: MRI HEAD WITHOUT CONTRAST TECHNIQUE: Multiplanar, multiecho pulse sequences of the brain and surrounding structures were obtained without intravenous  contrast. COMPARISON:  Head CT 07/07/2020 FINDINGS: Some sequences are moderately to severely motion degraded despite use of faster, more motion resistant imaging protocols and attempts at repeat imaging. Brain: There is no evidence of an acute infarct, intracranial hemorrhage, mass, midline shift, or extra-axial fluid collection. The ventricles and sulci are within normal limits for age. A subcentimeter T2 hyperintensity at the posterior aspect of the right lentiform nucleus may reflect a chronic lacunar infarct or dilated perivascular space. No significant white matter disease is evident elsewhere for age within limitations of motion. Vascular: Major intracranial vascular flow voids are preserved. Skull and upper cervical spine: No gross skull lesion. Sinuses/Orbits: Grossly unremarkable orbits. Subtotal opacification of the maxillary sinuses by combination of fluid and mucosal thickening. Moderate volume fluid in the left sphenoid sinus. Mild mucosal thickening in the frontal and ethmoid sinuses. Small left mastoid effusion. Other: 1.1 cm T2 hypointense nodule along the posterior margin of the right parotid tail. IMPRESSION: 1. No  acute intracranial abnormality identified on this motion degraded study. 2. Sinusitis. 3. 1.1 cm nodule along the right parotid tail, indeterminate though may reflect a lymph node or small parotid neoplasm. Electronically Signed   By: Logan Bores M.D.   On: 07/08/2020 14:19   DG Chest Port 1 View  Result Date: 07/09/2020 CLINICAL DATA:  Acute respiratory failure EXAM: PORTABLE CHEST 1 VIEW COMPARISON:  07/08/2020 FINDINGS: There is left lower lobe collapse with are lucency of the left hemithorax secondary to hyperinflation of the left upper lobe. Resultant left-sided volume loss. Coarsening of the interstitium within the lung apices is in keeping with moderate emphysema better appreciated on prior CT examination of 07/07/2020. No pneumothorax or pleural effusion. Cardiac size  within normal limits. Right internal jugular central venous catheter noted within the superior vena cava. Degenerative changes are seen within the left shoulder. IMPRESSION: Interval left lower lobe collapse. Moderate emphysema. Electronically Signed   By: Fidela Salisbury MD   On: 07/09/2020 04:51   DG Chest Port 1 View  Result Date: 07/08/2020 CLINICAL DATA:  Acute respiratory failure EXAM: PORTABLE CHEST 1 VIEW COMPARISON:  07/06/2020 FINDINGS: Endotracheal tube and nasogastric tube have been removed. Pulmonary insufflation is stable. Minimal left basilar atelectasis. Previously noted left pleural effusion has resolved. No pneumothorax or pleural effusion. Right internal jugular central venous catheter is unchanged with its tip at the superior vena cava. Cardiac size within normal limits. Pulmonary vascularity is normal. IMPRESSION: Interval extubation with stable pulmonary hypoinflation. Resolved left pleural effusion. Electronically Signed   By: Fidela Salisbury MD   On: 07/08/2020 03:56    Assessment: 65 year old female who presented with acute respiratory failure and AMS in the setting of possible benzodiazepine and opiate overuse at home. Now with persistent AMS of fluctuating severity.  1. Exam reveals a mildly agitated patient in an awake, nonverbal state, who is not following commands but who does visually track spontaneously. No adventitious movements suggestive of seizures are seen.  2. MRI brain: No acute abnormality 3. EEG has been ordered 4. Overall clinical picture is most consistent with a multifactorial delirium (hospital delirium, metabolic encephalopathy, septic encephalopathy) versus unusual case of prolonged benzo or EtOH withdrawal versus catatonia (history of depression). Low suspicion for seizures given no jerking or twitching seen on exams by myself or other providers. No neck stiffness to suggest a meningitis. Cefepime neurotoxicity is also possible.  5. Severe  leukocytosis  Recommendations: 1. Switch cefepime to an alternate antibiotic. Although less likely an inciting agent, linezolid has also been associated with neurotoxicity and switching this to an alternate antibiotic should be considered as well.  2. Trial of 2 mg IV Ativan can be tonight as EEG will not be obtained until tomorrow. The patient may improve with Ativan if she is catatonic, but also would be expected to improve somewhat if this is an unusual case of prolonged benzodiazepine withdrawal.  3. EEG pending.  4. Avoid opiates and other sedating meds. Discontinue Seroquel.   Electronically signed: Dr. Kerney Elbe 07/09/2020, 2:59 PM

## 2020-07-10 ENCOUNTER — Inpatient Hospital Stay: Payer: Medicare Other

## 2020-07-10 DIAGNOSIS — Z7189 Other specified counseling: Secondary | ICD-10-CM

## 2020-07-10 DIAGNOSIS — Z978 Presence of other specified devices: Secondary | ICD-10-CM

## 2020-07-10 DIAGNOSIS — Z515 Encounter for palliative care: Secondary | ICD-10-CM

## 2020-07-10 DIAGNOSIS — D72829 Elevated white blood cell count, unspecified: Secondary | ICD-10-CM

## 2020-07-10 DIAGNOSIS — R410 Disorientation, unspecified: Secondary | ICD-10-CM

## 2020-07-10 LAB — BASIC METABOLIC PANEL
Anion gap: 10 (ref 5–15)
BUN: 76 mg/dL — ABNORMAL HIGH (ref 8–23)
CO2: 24 mmol/L (ref 22–32)
Calcium: 8 mg/dL — ABNORMAL LOW (ref 8.9–10.3)
Chloride: 109 mmol/L (ref 98–111)
Creatinine, Ser: 2.07 mg/dL — ABNORMAL HIGH (ref 0.44–1.00)
GFR, Estimated: 26 mL/min — ABNORMAL LOW (ref >60)
Glucose, Bld: 160 mg/dL — ABNORMAL HIGH (ref 70–99)
Potassium: 4.5 mmol/L (ref 3.5–5.1)
Sodium: 143 mmol/L (ref 135–145)

## 2020-07-10 LAB — GASTROINTESTINAL PANEL BY PCR, STOOL (REPLACES STOOL CULTURE)

## 2020-07-10 LAB — LACTIC ACID, PLASMA
Lactic Acid, Venous: 1.8 mmol/L (ref 0.5–1.9)
Lactic Acid, Venous: 1.6 mmol/L (ref 0.5–1.9)

## 2020-07-10 LAB — CBC WITH DIFFERENTIAL/PLATELET
Abs Immature Granulocytes: 3.13 10*3/uL — ABNORMAL HIGH (ref 0.00–0.07)
Basophils Absolute: 0.1 10*3/uL (ref 0.0–0.1)
Basophils Relative: 0 %
Eosinophils Absolute: 0 10*3/uL (ref 0.0–0.5)
Eosinophils Relative: 0 %
HCT: 36.3 % (ref 36.0–46.0)
Hemoglobin: 12.1 g/dL (ref 12.0–15.0)
Immature Granulocytes: 4 %
Lymphocytes Relative: 4 %
Lymphs Abs: 3.3 10*3/uL (ref 0.7–4.0)
MCH: 32.4 pg (ref 26.0–34.0)
MCHC: 33.3 g/dL (ref 30.0–36.0)
MCV: 97.3 fL (ref 80.0–100.0)
Monocytes Absolute: 4.4 10*3/uL — ABNORMAL HIGH (ref 0.1–1.0)
Monocytes Relative: 5 %
Neutro Abs: 78.1 10*3/uL — ABNORMAL HIGH (ref 1.7–7.7)
Neutrophils Relative %: 87 %
Platelets: 411 10*3/uL — ABNORMAL HIGH (ref 150–400)
RBC: 3.73 MIL/uL — ABNORMAL LOW (ref 3.87–5.11)
RDW: 15.2 % (ref 11.5–15.5)
Smear Review: NORMAL
WBC: 88.9 10*3/uL (ref 4.0–10.5)
nRBC: 0 % (ref 0.0–0.2)

## 2020-07-10 LAB — MAGNESIUM
Magnesium: 3 mg/dL — ABNORMAL HIGH (ref 1.7–2.4)
Magnesium: 2.9 mg/dL — ABNORMAL HIGH (ref 1.7–2.4)

## 2020-07-10 LAB — C DIFFICILE (CDIFF) QUICK SCRN (NO PCR REFLEX)
C Diff antigen: NEGATIVE
C Diff interpretation: NOT DETECTED
C Diff toxin: NEGATIVE

## 2020-07-10 LAB — PROCALCITONIN: Procalcitonin: 1.18 ng/mL

## 2020-07-10 LAB — GLUCOSE, CAPILLARY
Glucose-Capillary: 150 mg/dL — ABNORMAL HIGH (ref 70–99)
Glucose-Capillary: 106 mg/dL — ABNORMAL HIGH (ref 70–99)
Glucose-Capillary: 130 mg/dL — ABNORMAL HIGH (ref 70–99)
Glucose-Capillary: 100 mg/dL — ABNORMAL HIGH (ref 70–99)
Glucose-Capillary: 115 mg/dL — ABNORMAL HIGH (ref 70–99)

## 2020-07-10 LAB — PHOSPHORUS
Phosphorus: 8.3 mg/dL — ABNORMAL HIGH (ref 2.5–4.6)
Phosphorus: 7.5 mg/dL — ABNORMAL HIGH (ref 2.5–4.6)

## 2020-07-10 LAB — CORTISOL: Cortisol, Plasma: 34.8 ug/dL

## 2020-07-10 MED ORDER — ORAL CARE MOUTH RINSE
15.0000 mL | OROMUCOSAL | Status: DC
  Administered 2020-07-10 – 2020-07-20 (×102): 15 mL via OROMUCOSAL

## 2020-07-10 MED ORDER — MIDAZOLAM BOLUS VIA INFUSION
2.0000 mg | INTRAVENOUS | Status: DC | PRN
  Administered 2020-07-10 – 2020-07-14 (×5): 2 mg via INTRAVENOUS
  Filled 2020-07-10: qty 2

## 2020-07-10 MED ORDER — MIDAZOLAM 50MG/50ML (1MG/ML) PREMIX INFUSION
0.5000 mg/h | INTRAVENOUS | Status: DC
  Administered 2020-07-10: 10 mg/h via INTRAVENOUS
  Administered 2020-07-10: 5 mg/h via INTRAVENOUS
  Administered 2020-07-10 – 2020-07-11 (×5): 10 mg/h via INTRAVENOUS
  Administered 2020-07-11: 8 mg/h via INTRAVENOUS
  Administered 2020-07-12: 9 mg/h via INTRAVENOUS
  Administered 2020-07-12: 8 mg/h via INTRAVENOUS
  Administered 2020-07-12: 10 mg/h via INTRAVENOUS
  Administered 2020-07-13: 5 mg/h via INTRAVENOUS
  Administered 2020-07-13 (×2): 6 mg/h via INTRAVENOUS
  Administered 2020-07-14 (×2): 4 mg/h via INTRAVENOUS
  Administered 2020-07-15: 5 mg/h via INTRAVENOUS
  Administered 2020-07-15: 4 mg/h via INTRAVENOUS
  Administered 2020-07-16: 5 mg/h via INTRAVENOUS
  Administered 2020-07-16: 3 mg/h via INTRAVENOUS
  Administered 2020-07-17: 6 mg/h via INTRAVENOUS
  Filled 2020-07-10 (×20): qty 50

## 2020-07-10 MED ORDER — THIAMINE HCL 100 MG/ML IJ SOLN
100.0000 mg | INTRAMUSCULAR | Status: DC
  Administered 2020-07-13 – 2020-07-19 (×7): 100 mg via INTRAVENOUS
  Filled 2020-07-10 (×7): qty 2

## 2020-07-10 MED ORDER — ETOMIDATE 2 MG/ML IV SOLN
20.0000 mg | Freq: Once | INTRAVENOUS | Status: AC
  Administered 2020-07-10: 20 mg via INTRAVENOUS
  Filled 2020-07-10: qty 10

## 2020-07-10 MED ORDER — VECURONIUM BROMIDE 10 MG IV SOLR
10.0000 mg | Freq: Once | INTRAVENOUS | Status: AC
  Administered 2020-07-10: 10 mg via INTRAVENOUS
  Filled 2020-07-10: qty 10

## 2020-07-10 MED ORDER — VANCOMYCIN VARIABLE DOSE PER UNSTABLE RENAL FUNCTION (PHARMACIST DOSING): Status: DC

## 2020-07-10 MED ORDER — FENTANYL 2500MCG IN NS 250ML (10MCG/ML) PREMIX INFUSION
INTRAVENOUS | Status: AC
  Administered 2020-07-10: 50 ug/h via INTRAVENOUS
  Filled 2020-07-10: qty 250

## 2020-07-10 MED ORDER — FENTANYL CITRATE (PF) 100 MCG/2ML IJ SOLN
50.0000 ug | INTRAMUSCULAR | Status: DC | PRN
  Administered 2020-07-10: 50 ug via INTRAVENOUS
  Filled 2020-07-10: qty 2

## 2020-07-10 MED ORDER — NOREPINEPHRINE 16 MG/250ML-% IV SOLN
0.0000 ug/min | INTRAVENOUS | Status: DC
  Administered 2020-07-10: 24 ug/min via INTRAVENOUS
  Administered 2020-07-10: 10 ug/min via INTRAVENOUS
  Administered 2020-07-10: 36 ug/min via INTRAVENOUS
  Administered 2020-07-11: 32 ug/min via INTRAVENOUS
  Administered 2020-07-11: 8 ug/min via INTRAVENOUS
  Filled 2020-07-10 (×5): qty 250

## 2020-07-10 MED ORDER — METRONIDAZOLE 500 MG/100ML IV SOLN
500.0000 mg | Freq: Three times a day (TID) | INTRAVENOUS | Status: DC
  Administered 2020-07-10 – 2020-07-11 (×3): 500 mg via INTRAVENOUS
  Filled 2020-07-10 (×5): qty 100

## 2020-07-10 MED ORDER — LACTATED RINGERS IV BOLUS
1000.0000 mL | Freq: Once | INTRAVENOUS | Status: AC
  Administered 2020-07-10: 1000 mL via INTRAVENOUS

## 2020-07-10 MED ORDER — FENTANYL 2500MCG IN NS 250ML (10MCG/ML) PREMIX INFUSION
50.0000 ug/h | INTRAVENOUS | Status: DC
  Administered 2020-07-11 – 2020-07-14 (×7): 150 ug/h via INTRAVENOUS
  Administered 2020-07-15: 100 ug/h via INTRAVENOUS
  Administered 2020-07-16: 150 ug/h via INTRAVENOUS
  Administered 2020-07-16: 175 ug/h via INTRAVENOUS
  Filled 2020-07-10 (×10): qty 250

## 2020-07-10 MED ORDER — CHLORHEXIDINE GLUCONATE 0.12% ORAL RINSE (MEDLINE KIT)
15.0000 mL | Freq: Two times a day (BID) | OROMUCOSAL | Status: DC
  Administered 2020-07-10 – 2020-07-20 (×21): 15 mL via OROMUCOSAL

## 2020-07-10 MED ORDER — LACTATED RINGERS IV BOLUS
500.0000 mL | Freq: Once | INTRAVENOUS | Status: AC
  Administered 2020-07-10: 500 mL via INTRAVENOUS

## 2020-07-10 MED ORDER — VANCOMYCIN HCL 1500 MG/300ML IV SOLN
1500.0000 mg | Freq: Once | INTRAVENOUS | Status: AC
  Administered 2020-07-10: 1500 mg via INTRAVENOUS
  Filled 2020-07-10: qty 300

## 2020-07-10 MED ORDER — LACTATED RINGERS IV SOLN: INTRAVENOUS | Status: DC

## 2020-07-10 MED ORDER — FREE WATER
150.0000 mL | Status: DC
  Administered 2020-07-11 – 2020-07-12 (×8): 150 mL

## 2020-07-10 MED ORDER — NOREPINEPHRINE 4 MG/250ML-% IV SOLN
INTRAVENOUS | Status: AC
  Filled 2020-07-10: qty 250

## 2020-07-10 MED ORDER — VECURONIUM BROMIDE 10 MG IV SOLR
INTRAVENOUS | Status: AC
  Filled 2020-07-10: qty 10

## 2020-07-10 MED ORDER — THIAMINE HCL 100 MG/ML IJ SOLN
500.0000 mg | Freq: Three times a day (TID) | INTRAVENOUS | Status: AC
  Administered 2020-07-10 – 2020-07-13 (×9): 500 mg via INTRAVENOUS
  Filled 2020-07-10 (×9): qty 5

## 2020-07-10 MED ORDER — DEXMEDETOMIDINE HCL IN NACL 400 MCG/100ML IV SOLN
0.4000 ug/kg/h | INTRAVENOUS | Status: DC
  Administered 2020-07-10 (×2): 0.4 ug/kg/h via INTRAVENOUS
  Administered 2020-07-11 (×4): 1.2 ug/kg/h via INTRAVENOUS
  Administered 2020-07-11: 1 ug/kg/h via INTRAVENOUS
  Administered 2020-07-12 – 2020-07-13 (×6): 1.2 ug/kg/h via INTRAVENOUS
  Administered 2020-07-13: 1.201 ug/kg/h via INTRAVENOUS
  Administered 2020-07-13 – 2020-07-15 (×6): 1.2 ug/kg/h via INTRAVENOUS
  Administered 2020-07-15: 1 ug/kg/h via INTRAVENOUS
  Administered 2020-07-15 – 2020-07-16 (×3): 1.2 ug/kg/h via INTRAVENOUS
  Filled 2020-07-10 (×26): qty 100

## 2020-07-10 MED ORDER — VASOPRESSIN 20 UNITS/100 ML INFUSION FOR SHOCK
0.0000 [IU]/min | INTRAVENOUS | Status: DC
  Administered 2020-07-10: 0.03 [IU]/min via INTRAVENOUS
  Administered 2020-07-10: 0.04 [IU]/min via INTRAVENOUS
  Administered 2020-07-10: 0.03 [IU]/min via INTRAVENOUS
  Administered 2020-07-11 (×3): 0.04 [IU]/min via INTRAVENOUS
  Administered 2020-07-12 – 2020-07-13 (×2): 0.02 [IU]/min via INTRAVENOUS
  Administered 2020-07-14: 0.01 [IU]/min via INTRAVENOUS
  Filled 2020-07-10 (×8): qty 100

## 2020-07-10 MED ORDER — FENTANYL CITRATE (PF) 100 MCG/2ML IJ SOLN: 100.0000 ug | Freq: Once | INTRAMUSCULAR | Status: AC

## 2020-07-10 MED ORDER — MIDODRINE HCL 5 MG PO TABS
10.0000 mg | ORAL_TABLET | Freq: Three times a day (TID) | ORAL | Status: DC
  Administered 2020-07-10 – 2020-07-17 (×21): 10 mg
  Filled 2020-07-10 (×22): qty 2

## 2020-07-10 MED ORDER — PROPOFOL 1000 MG/100ML IV EMUL
0.0000 ug/kg/min | INTRAVENOUS | Status: DC
  Administered 2020-07-10: 5 ug/kg/min via INTRAVENOUS
  Filled 2020-07-10: qty 100

## 2020-07-10 MED ORDER — ALPRAZOLAM 1 MG PO TABS
1.0000 mg | ORAL_TABLET | Freq: Three times a day (TID) | ORAL | Status: DC
  Administered 2020-07-10 – 2020-07-20 (×32): 1 mg
  Filled 2020-07-10 (×27): qty 2
  Filled 2020-07-10: qty 1
  Filled 2020-07-10 (×4): qty 2

## 2020-07-10 MED ORDER — SODIUM CHLORIDE 0.9 % IV SOLN
2.0000 g | INTRAVENOUS | Status: DC
  Administered 2020-07-10 – 2020-07-11 (×2): 2 g via INTRAVENOUS
  Filled 2020-07-10 (×2): qty 2

## 2020-07-10 MED ORDER — FENTANYL CITRATE (PF) 100 MCG/2ML IJ SOLN
50.0000 ug | INTRAMUSCULAR | Status: DC | PRN
Start: 2020-07-10 — End: 2020-07-17
  Administered 2020-07-10 – 2020-07-13 (×2): 100 ug via INTRAVENOUS
  Administered 2020-07-14: 50 ug via INTRAVENOUS

## 2020-07-10 MED ORDER — ROCURONIUM BROMIDE 50 MG/5ML IV SOLN: 10.0000 mg | Freq: Once | INTRAVENOUS | Status: AC

## 2020-07-10 NOTE — Progress Notes (Signed)
RT at bedside with nurse and NP, pts saturation staying in high 70's despite all measures of equipment to maintain oxygenation. Pt was intubated around 0032.

## 2020-07-10 NOTE — Consult Note (Signed)
 Hematology/Oncology Consult note [facility] [phone] [phone]  Patient Care Team: Shah, Syed Asad A, MD as PCP - General (Family Medicine)   Name of the patient: Myalee Bobier  [id number]  02/09/1955   Date of visit: 07/10/20 REASON FOR COSULTATION:  Marked leukocytosis History of presenting illness-  65 y.o. female with PMH listed at below who currently admitted to ICU due to altered mental status, delirium.  Patient is not able to provide any history.  Currently intubated.  Extensive medical records review was performed. Patient has a history of COPD, depression, hypertension, opioid abuse, initially sent to emergency room via EMS. Per EMS, they were called out to nursing for altered mental status and hypoxia.  Admitted for acute on chronic respiratory failure.  There was history of patient had a fall few days ago prior to the presentation, had taking increasing dose of opiate.  Patient met sepsis shock criteria. 06/13/2020, CT head showed no acute intracranial abnormalities. 06/27/2020 CT chest abdomen pelvis showed large right pleural effusion and associated atelectasis or consolidation.  Wedge deformities of T7 and T8 vertebral bodies.  Chest tube was placed.  Patient was also intubated. Acute on chronic respiratory failure was felt to be due to COPD exacerbation, right lower lobe pneumonia and large right pleural effusion.  Patient has been on empiric broad-spectrum antibiotics and IV steroids and vasopressors.  COVID-19 and influenza PCR negative.  Strep pneumo urinary antigen negative, MRSA PCR positive.  Right pleural fluid analysis was consistent with exudate.  Blood culture was negative. 07/06/2020, patient was extubated.  Remains confused. 07/07/2020, CT without contrast showed largely resolved right pleural effusion with pleural drain in place.  Small volume retained secretions in the airway.  No convincing pneumonia.  Emphysema.-Chest  tube was removed. Patient's mental status remains altered.  Repeat CT head was negative. 07/09/2020, MRI brain negative for acute process.  It was noted that patient's white count progressively increases. 06/26/2020 upon admission, patient has a white count of 23.7 which trended down to 17.2 on 07/05/2020, then started to trend up progressively.  07/09/2020, total white count is 55 predominantly neutrophilia as well as monocytosis. IV Solu-Medrol was discontinued on 07/09/2020.  07/10/2020, total white count progressively further decreased increased to 88.9.  Pathology smear showed normal morphology of RBC, WBC and platelets. 07/09/2020, CTA chest was obtained due to tachypnea, hypoxia and low-grade fever.  CT abdomen pelvis was obtained due to persistent leukocytosis. No PE.  Aspirated debris's in the trachea in the bilateral lower lobe bronchi with left lower lobe collapse.  Large stool burden in the rectum with edema in the presacral space may reflect proctitis.  07/09/2020, mental status remains altered.  Seen by psychiatrist and neurologist.  Pending EEG 07/10/2020, overnight patient developed hypoxia and respiratory distress.  Was reintubated.   Review of Systems  Unable to perform ROS: Mental status change   Allergies  Allergen Reactions   Penicillins Rash    Has patient had a PCN reaction causing immediate rash, facial/tongue/throat swelling, SOB or lightheadedness with hypotension: Yes Has patient had a PCN reaction causing severe rash involving mucus membranes or skin necrosis: No Has patient had a PCN reaction that required hospitalization: No Has patient had a PCN reaction occurring within the last 10 years: No If all of the above answers are "NO", then may proceed with Cephalosporin use.    Patient Active Problem List   Diagnosis Date Noted   Subacute delirium 07/08/2020   Pleural effusion on   right 07/07/2020   Community acquired pneumonia 07/07/2020   Acute respiratory failure (HCC)  07/09/2020   Severe sepsis with septic shock (HCC) 02/08/2017   Chronic low back pain (Primary Area of Pain) (Bilateral) (R>L) 01/12/2017   Chronic pain of lower extremity (Secondary Area of Pain) (B (R>L) 01/12/2017   Chronic pain syndrome 01/12/2017   Long term current use of opiate analgesic 01/12/2017   Disorder of bone, unspecified 01/12/2017   Other long term (current) drug therapy 01/12/2017   Other specified health status 01/12/2017   Chronic sacroiliac joint pain 01/12/2017   Post-nasal drainage 10/31/2016   Joint stiffness of hand 04/07/2016   URI with cough and congestion 01/19/2016   Discoloration of skin of finger 11/26/2015   Need for home health care 07/20/2015   Decreased vision 07/20/2015   Acute sinusitis 07/16/2015   Bilateral leg pain 06/18/2015   Pain of left breast 05/21/2015   Lesion of skin of face 12/25/2014   Postprandial abdominal bloating 11/11/2014   COPD (chronic obstructive pulmonary disease) (HCC) 11/03/2014   Nasal sinus congestion 11/03/2014   Hypertension 09/02/2014   Dry skin 09/02/2014   Menopausal symptoms 08/05/2014   Peripheral sensory neuropathy 07/10/2014   Anxiety and depression 07/10/2014   Airway hyperreactivity 07/10/2014   At risk for falling 07/10/2014   Lumbosacral spondylosis 07/10/2014   DDD (degenerative disc disease), lumbosacral 07/10/2014   Discharge of ear 07/10/2014   Degenerative arthritis of lumbar spine 07/10/2014   Hyperlipidemia 07/10/2014   Dysfunction of eustachian tube 07/10/2014   ANA positive 07/10/2014   Hypomagnesemia 07/10/2014   Hypokalemia 07/10/2014   Cerumen impaction 07/10/2014   Cramps of lower extremity 07/10/2014   Leg swelling 07/10/2014   Spasm 07/10/2014   Compulsive tobacco user syndrome 07/10/2014   Atrophy of vagina 07/10/2014   Chronic radicular low back pain 06/11/2014   Insomnia 06/11/2014   Panic disorder with agoraphobia and severe panic attacks 06/11/2014   Foot pain 12/17/2013    Burning sensation of feet 12/17/2013   Numbness and tingling 12/17/2013   CAFL (chronic airflow limitation) (HCC) 06/21/2011   Clinical depression 06/21/2011     Past Medical History:  Diagnosis Date   Anxiety    Chronic lower back pain    COPD (chronic obstructive pulmonary disease) (HCC)    Depression    Hypertension    Panic disorder with agoraphobia and severe panic attacks    Peripheral sensory neuropathy      Past Surgical History:  Procedure Laterality Date   I & D EXTREMITY Right 02/09/2017   Procedure: IRRIGATION AND DEBRIDEMENT EXTREMITY;  Surgeon: Bowers, James R, MD;  Location: [facility] ORS;  Service: Orthopedics;  Laterality: Right;   none      Social History   Socioeconomic History   Marital status: Single    Spouse name: Not on file   Number of children: Not on file   Years of education: Not on file   Highest education level: Not on file  Occupational History   Not on file  Tobacco Use   Smoking status: Every Day    Packs/day: 1.00    Years: 30.00    Pack years: 30.00    Types: Cigarettes, E-cigarettes   Smokeless tobacco: Never  Vaping Use   Vaping Use: Every day  Substance and Sexual Activity   Alcohol use: No   Drug use: Yes    Comment: Pt states prescribed oxy and xanax   Sexual activity: Not Currently  Other Topics Concern     Not on file  Social History Narrative   Not on file   Social Determinants of Health   Financial Resource Strain: Not on file  Food Insecurity: Not on file  Transportation Needs: Not on file  Physical Activity: Not on file  Stress: Not on file  Social Connections: Not on file  Intimate Partner Violence: Not on file     Family History  Problem Relation Age of Onset   Cancer Mother        Lung   Cancer Father        pancreatic   Breast cancer Neg Hx    Ovarian cancer Neg Hx    Colon cancer Neg Hx    Diabetes Neg Hx    Heart disease Neg Hx      Current Facility-Administered Medications:    acetaminophen  (TYLENOL) suppository 325 mg, 325 mg, Rectal, Q4H PRN, Patel, Sona, MD, 325 mg at 07/09/20 1822   acetaminophen (TYLENOL) tablet 650 mg, 650 mg, Oral, Q6H PRN, Patel, Sona, MD, 650 mg at 07/09/20 1354   ALPRAZolam (XANAX) tablet 1 mg, 1 mg, Per Tube, TID, Gonzalez, Carmen L, MD, 1 mg at 07/10/20 1549   budesonide (PULMICORT) nebulizer solution 0.5 mg, 0.5 mg, Nebulization, BID, Graves, Dana E, NP, 0.5 mg at 07/10/20 0747   cefTAZidime (FORTAZ) 2 g in sodium chloride 0.9 % 100 mL IVPB, 2 g, Intravenous, Q24H, Gonzalez, Carmen L, MD   chlorhexidine gluconate (MEDLINE KIT) (PERIDEX) 0.12 % solution 15 mL, 15 mL, Mouth Rinse, BID, Keene, Jeremiah D, NP, 15 mL at 07/10/20 0923   Chlorhexidine Gluconate Cloth 2 % PADS 6 each, 6 each, Topical, Daily, Mannam, Praveen, MD, 6 each at 07/10/20 0917   chlorpheniramine-HYDROcodone (TUSSIONEX) 10-8 MG/5ML suspension 5 mL, 5 mL, Oral, Q12H PRN, Mansy, Jan A, MD   dexmedetomidine (PRECEDEX) 400 MCG/100ML (4 mcg/mL) infusion, 0.4-1.2 mcg/kg/hr, Intravenous, Titrated, Keene, Jeremiah D, NP, Last Rate: 5.96 mL/hr at 07/10/20 1500, 0.4 mcg/kg/hr at 07/10/20 1500   docusate (COLACE) 50 MG/5ML liquid 100 mg, 100 mg, Oral, BID PRN, Patel, Sona, MD   feeding supplement (OSMOLITE 1.5 CAL) liquid 1,000 mL, 1,000 mL, Per Tube, Continuous, Gonzalez, Carmen L, MD, Last Rate: 20 mL/hr at 07/09/20 1827, Rate Change at 07/09/20 1827   feeding supplement (PROSource TF) liquid 45 mL, 45 mL, Per Tube, BID, Kasa, Kurian, MD, 45 mL at 07/09/20 2237   fentaNYL (SUBLIMAZE) injection 50 mcg, 50 mcg, Intravenous, Q15 min PRN, Ouma, Elizabeth Achieng, NP, 50 mcg at 07/10/20 0520   fentaNYL (SUBLIMAZE) injection 50-200 mcg, 50-200 mcg, Intravenous, Q30 min PRN, Ouma, Elizabeth Achieng, NP   fentaNYL 2500mcg in NS 250mL (10mcg/ml) infusion-PREMIX, 50-200 mcg/hr, Intravenous, Continuous, Ouma, Elizabeth Achieng, NP, Last Rate: 10 mL/hr at 07/10/20 1500, 100 mcg/hr at 07/10/20 1500   free water  150 mL, 150 mL, Per Tube, Q4H, Gonzalez, Carmen L, MD   guaiFENesin (ROBITUSSIN) 100 MG/5ML solution 100 mg, 5 mL, Per Tube, Q4H PRN, Chappell, Alex B, RPH   heparin injection 5,000 Units, 5,000 Units, Subcutaneous, Q8H, Mannam, Praveen, MD, 5,000 Units at 07/10/20 1357   hydrALAZINE (APRESOLINE) injection 10 mg, 10 mg, Intravenous, TID, Patel, Sona, MD, 10 mg at 07/08/20 2128   ipratropium-albuterol (DUONEB) 0.5-2.5 (3) MG/3ML nebulizer solution 3 mL, 3 mL, Nebulization, Q6H PRN, Graves, Dana E, NP   ipratropium-albuterol (DUONEB) 0.5-2.5 (3) MG/3ML nebulizer solution 3 mL, 3 mL, Nebulization, Q4H, Keene, Jeremiah D, NP, 3 mL at 07/10/20 1511   labetalol (NORMODYNE) injection 10   mg, 10 mg, Intravenous, Q2H PRN, Darel Hong D, NP, 10 mg at 07/08/20 1227   LORazepam (ATIVAN) injection 1-2 mg, 1-2 mg, Intravenous, Q4H PRN, Lang Snow, NP, 1 mg at 07/09/20 2343   MEDLINE mouth rinse, 15 mL, Mouth Rinse, 10 times per day, Bradly Bienenstock, NP, 15 mL at 07/10/20 1550   midazolam (VERSED) 50 mg/50 mL (1 mg/mL) premix infusion, 0.5-10 mg/hr, Intravenous, Continuous, Ouma, Bing Neighbors, NP, Last Rate: 10 mL/hr at 07/10/20 1545, 10 mg/hr at 07/10/20 1545   midodrine (PROAMATINE) tablet 10 mg, 10 mg, Per Tube, TID WC, Darel Hong D, NP, 10 mg at 07/10/20 1150   morphine 2 MG/ML injection 1-2 mg, 1-2 mg, Intravenous, Q4H PRN, Darel Hong D, NP, 2 mg at 07/09/20 2327   multivitamin liquid 15 mL, 15 mL, Oral, Daily, Fritzi Mandes, MD, 15 mL at 07/10/20 0917   norepinephrine (LEVOPHED) 16 mg in 226m premix infusion, 0-40 mcg/min, Intravenous, Titrated, Ouma, EBing Neighbors NP, Last Rate: 31.9 mL/hr at 07/10/20 1500, 34 mcg/min at 07/10/20 1500   pantoprazole (PROTONIX) injection 40 mg, 40 mg, Intravenous, QHS, Mannam, Praveen, MD, 40 mg at 07/09/20 2141   polyethylene glycol (MIRALAX / GLYCOLAX) packet 17 g, 17 g, Oral, Daily, GDallie Piles RPH, 17 g at 07/10/20 0919    polyethylene glycol (MIRALAX / GLYCOLAX) packet 17 g, 17 g, Oral, Daily PRN, PFritzi Mandes MD   senna (SENOKOT) tablet 17.2 mg, 2 tablet, Oral, QHS, GDallie Piles RPH, 17.2 mg at 07/09/20 2141   sodium phosphate (FLEET) 7-19 GM/118ML enema 1 enema, 1 enema, Rectal, Once PRN, PFritzi Mandes MD   thiamine 5020min normal saline (501mIVPB, 500 mg, Intravenous, Q8H, Stopped at 07/10/20 1419 **FOLLOWED BY** [START ON 07/13/2020] thiamine (B-1) injection 100 mg, 100 mg, Intravenous, Q24H, GonTyler PitaD   vancomycin (VANCOCIN) 50 mg/mL oral solution 500 mg, 500 mg, Per Tube, Q6H, PatFritzi MandesD, 500 mg at 07/10/20 1300   vancomycin (VANCOREADY) IVPB 1500 mg/300 mL, 1,500 mg, Intravenous, Once, GonTyler PitaD   vancomycin variable dose per unstable renal function (pharmacist dosing), , Does not apply, See admin instructions, GonTyler PitaD   vasopressin (PITRESSIN) 20 Units in sodium chloride 0.9 % 100 mL infusion-*FOR SHOCK*, 0-0.03 Units/min, Intravenous, Continuous, Ouma, EliBing NeighborsP, Last Rate: 9 mL/hr at 07/10/20 1500, 0.03 Units/min at 07/10/20 1500   vecuronium (NORCURON) 10 MG injection, , , ,    Physical exam:  Vitals:   07/10/20 1330 07/10/20 1400 07/10/20 1500 07/10/20 1512  BP: 103/73 101/68 106/70   Pulse: (!) 108 (!) 111 (!) 114   Resp: (!) 36 (!) 27 (!) 31   Temp:  99 F (37.2 C)    TempSrc:  Oral    SpO2: 100% 100% 100% 100%  Weight:      Height:       Physical Exam Constitutional:      Comments: Intubated  HENT:     Head: Normocephalic.  Eyes:     General: No scleral icterus. Cardiovascular:     Rate and Rhythm: Normal rate.  Abdominal:     General: There is no distension.     Palpations: Abdomen is soft.  Musculoskeletal:        General: No swelling or tenderness.        CMP Latest Ref Rng & Units 07/10/2020  Glucose 70 - 99 mg/dL 160(H)  BUN 8 - 23 mg/dL 76(H)  Creatinine 0.44 -  1.00 mg/dL 2.07(H)  Sodium 135 - 145 mmol/L 143   Potassium 3.5 - 5.1 mmol/L 4.5  Chloride 98 - 111 mmol/L 109  CO2 22 - 32 mmol/L 24  Calcium 8.9 - 10.3 mg/dL 8.0(L)  Total Protein 6.5 - 8.1 g/dL -  Total Bilirubin 0.3 - 1.2 mg/dL -  Alkaline Phos 38 - 126 U/L -  AST 15 - 41 U/L -  ALT 0 - 44 U/L -   CBC Latest Ref Rng & Units 07/10/2020  WBC 4.0 - 10.5 K/uL 88.9(HH)  Hemoglobin 12.0 - 15.0 g/dL 12.1  Hematocrit 36.0 - 46.0 % 36.3  Platelets 150 - 400 K/uL 411(H)    RADIOGRAPHIC STUDIES: I have personally reviewed the radiological images as listed and agreed with the findings in the report. DG Chest 1 View  Result Date: 07/10/2020 CLINICAL DATA:  Intubated EXAM: CHEST  1 VIEW COMPARISON:  07/09/2020 FINDINGS: Endotracheal tube is 2 cm above the carina. Right central line is unchanged. NG tube enters the stomach. Heart is normal size. Left lower lobe atelectasis, progressing since prior study. Right lung clear. No effusions. No acute bony abnormality. IMPRESSION: Endotracheal tube 2 cm above the carina. Worsening left lower lobe atelectasis. Electronically Signed   By: Kevin  Dover M.D.   On: 07/10/2020 01:02   DG Chest 1 View  Result Date: 07/08/2020 CLINICAL DATA:  Dyspnea EXAM: CHEST  1 VIEW COMPARISON:  07/08/2020, 07/06/2020, 07/05/2020 FINDINGS: Right-sided central venous catheter tip over the SVC. Airspace disease at the left lung base. Stable cardiomediastinal silhouette. No pneumothorax. IMPRESSION: Increased airspace disease at left base since radiograph earlier today, could be secondary to atelectasis or aspiration. Electronically Signed   By: Kim  Fujinaga M.D.   On: 07/08/2020 21:00   DG Chest 1 View  Result Date: 07/06/2020 CLINICAL DATA:  Pleural effusion on the right EXAM: CHEST  1 VIEW COMPARISON:  Yesterday FINDINGS: Endotracheal tube with tip between the clavicular heads and carina. The enteric tube reaches the stomach. Right IJ line with tip at the SVC. Chest tube on the right. No visible pneumothorax. Increased hazy  density at both lung bases. Normal heart size. Notably severe left glenohumeral osteoarthritis. IMPRESSION: 1. Stable hardware positioning.  No visible pneumothorax. 2. Worsened aeration with increased opacity at the bases. Electronically Signed   By: Jonathon  Watts M.D.   On: 07/06/2020 07:18   DG Abd 1 View  Result Date: 07/09/2020 CLINICAL DATA:  NG tube placement. EXAM: ABDOMEN - 1 VIEW COMPARISON:  07/01/2020. FINDINGS: Right IJ line noted with tip over SVC. NG tube is been advanced. NG tube noted with tip over the stomach. Stool noted throughout the colon. Mild bibasilar atelectasis. IMPRESSION: NG tube is been advanced. NG tube noted with tip over the distal stomach in good anatomic location. Electronically Signed   By: Thomas  Register   On: 07/09/2020 12:57   DG Abd 1 View  Result Date: 06/16/2020 CLINICAL DATA:  64-year-old female status post NG placement. EXAM: ABDOMEN - 1 VIEW COMPARISON:  Earlier radiograph dated 06/22/2020. FINDINGS: Partially visualized enteric tube with side port in the region of the GE junction. Recommend further advancing of the tube by additional 7 cm. A pigtail drainage catheter noted at the right lung base. Partially visualized small right pleural effusion. There is large stool within the colon. Similar appearance of bowel gas pattern. The osseous structures are intact. IMPRESSION: 1. Enteric tube with side port in the region of the GE junction. Recommend further advancing of   the tube by additional 7 cm. 2. Pigtail catheter with tip over the right lung base. Partially visualized small right pleural effusion. Electronically Signed   By: Anner Crete M.D.   On: 06/15/2020 18:42   DG Abdomen 1 View  Result Date: 07/03/2020 CLINICAL DATA:  Evaluate NG tube placement EXAM: ABDOMEN - 1 VIEW COMPARISON:  None FINDINGS: The tip of the NG tube is below the level of the GE junction the gastric fundus. The side port is just above the GE junction. Gaseous distension of the  stomach noted. Large stool burden identified within the colon. Right pleural effusion. IMPRESSION: The tip of the NG tube is in the gastric fundus with side port just above the GE junction. Consider advancing by approximately 4 cm such that the side port is situated below the level of the GE junction. Electronically Signed   By: Kerby Moors M.D.   On: 07/01/2020 12:06   CT HEAD WO CONTRAST  Result Date: 07/07/2020 CLINICAL DATA:  Anoxic brain damage EXAM: CT HEAD WITHOUT CONTRAST TECHNIQUE: Contiguous axial images were obtained from the base of the skull through the vertex without intravenous contrast. COMPARISON:  CT head 07/09/2020 FINDINGS: Brain: Ventricle size and cerebral volume normal. Negative for cerebral edema. No significant effacement of the sulci compared to the prior study. No acute hemorrhage or infarct or mass. Vascular: Atherosclerotic calcification in the carotid and vertebral arteries. Negative for hyperdense vessel Skull: Negative Sinuses/Orbits: Air-fluid level sphenoid sinus has enlarged since the prior study. Mucosal edema throughout the maxillary sinus and ethmoid sinuses bilaterally. Negative orbit Other: None IMPRESSION: No acute abnormality. No evidence of cerebral edema or infarction. No intracranial hemorrhage Electronically Signed   By: Franchot Gallo M.D.   On: 07/07/2020 11:25   CT Head Wo Contrast  Result Date: 07/09/2020 CLINICAL DATA:  Head trauma.  Fall several days ago. EXAM: CT HEAD WITHOUT CONTRAST CT CERVICAL SPINE WITHOUT CONTRAST TECHNIQUE: Multidetector CT imaging of the head and cervical spine was performed following the standard protocol without intravenous contrast. Multiplanar CT image reconstructions of the cervical spine were also generated. COMPARISON:  04/11/2014 FINDINGS: CT HEAD FINDINGS Brain: No evidence of acute infarction, hemorrhage, hydrocephalus, extra-axial collection or mass lesion/mass effect. Vascular: No hyperdense vessel or unexpected  calcification. Skull: Normal. Negative for fracture or focal lesion. Sinuses/Orbits: Opacification of the maxillary sinuses, ethmoid air cells noted. Sphenoid sinus mucosal thickening. Mastoid air cells are clear. Other: None CT CERVICAL SPINE FINDINGS Alignment: 4 mm anterolisthesis of L3 on L4 is identified, image 28/6. Skull base and vertebrae: No acute fracture. No primary bone lesion or focal pathologic process. Soft tissues and spinal canal: No prevertebral fluid or swelling. No visible canal hematoma. Disc levels: Marked multi level spondylosis identified throughout the cervical spine. Most advanced at C3-4, C4-5, C5-6 and C6-7. Bilateral facet arthropathy is also noted. Upper chest: Large right pleural effusion identified with opacification of the right upper lobe. Other: None IMPRESSION: 1. No acute intracranial abnormalities. 2. Chronic sinus inflammation. 3. No evidence for cervical spine fracture. 4. Advanced cervical spondylosis. 5. Large right pleural effusion with opacification of the right upper lobe. Electronically Signed   By: Kerby Moors M.D.   On: 06/12/2020 13:26   CT CHEST WO CONTRAST  Result Date: 07/07/2020 CLINICAL DATA:  65 year old female with respiratory failure. COPD. Recent right pneumonia, pleural effusion status post chest tube. EXAM: CT CHEST WITHOUT CONTRAST TECHNIQUE: Multidetector CT imaging of the chest was performed following the standard protocol without IV  contrast. COMPARISON:  Chest radiograph 07/06/2020 and earlier. Chest CT 06/15/2020. FINDINGS: Cardiovascular: Right IJ central line now in place terminating in the SVC. Calcified aortic atherosclerosis. Calcified coronary artery atherosclerosis. Vascular patency is not evaluated in the absence of IV contrast. No cardiomegaly or pericardial effusion. Mediastinum/Nodes: Enteric tube removed. Mediastinal lymph nodes appear stable and within normal limits. Lungs/Pleura: Pigtail type right pleural drain now in place with  drainage of the right pleural effusion seen on the prior CT. Trace residual fluid and/or pleural thickening in the right costophrenic angle. No pneumothorax. Upper lobe predominant centrilobular emphysema. Small volume retained secretions located dependently in the trachea and right side airways. No residual lung consolidation. Small calcified granuloma along the surface of the right diaphragm is chronic and stable since 2016. No areas of worsening ventilation since 06/11/2020. No left pleural effusion. Upper Abdomen: Negative visible noncontrast liver, spleen, pancreas, adrenal glands, left kidney, and bowel in the upper abdomen. Musculoskeletal: Osteopenia. Respiratory motion at the lower ribs. Advanced chronic left glenohumeral joint degeneration. Stable T7 and T8 compression fractures. No new osseous abnormality. IMPRESSION: 1. Largely resolved right pleural effusion with pleural drain in place. Mild or trace residual fluid and/or thickening in the right costophrenic angle. 2. Small volume retained secretions in the airways. No convincing pneumonia. Emphysema (ICD10-J43.9). 3. Aortic Atherosclerosis (ICD10-I70.0). Electronically Signed   By: Genevie Ann M.D.   On: 07/07/2020 11:29   CT Angio Chest Pulmonary Embolism (PE) W or WO Contrast  Result Date: 07/09/2020 CLINICAL DATA:  Tachypnea, hypoxia, fever. EXAM: CT ANGIOGRAPHY CHEST CT ABDOMEN AND PELVIS WITH CONTRAST TECHNIQUE: Multidetector CT imaging of the chest was performed using the standard protocol during bolus administration of intravenous contrast. Multiplanar CT image reconstructions and MIPs were obtained to evaluate the vascular anatomy. Multidetector CT imaging of the abdomen and pelvis was performed using the standard protocol during bolus administration of intravenous contrast. CONTRAST:  186m OMNIPAQUE IOHEXOL 350 MG/ML SOLN COMPARISON:  CT chest dated 06/10/2020. FINDINGS: CTA CHEST FINDINGS Cardiovascular: Satisfactory opacification of the  pulmonary arteries to the segmental level. No evidence of pulmonary embolism. Vascular calcifications are seen in the aortic arch. Normal heart size. No pericardial effusion. A right internal jugular central venous catheter terminates in the superior vena cava. Mediastinum/Nodes: No enlarged mediastinal, hilar, or axillary lymph nodes. Aspirated debris is seen in the trachea and bilateral lower lobe bronchi. Thyroid gland and esophagus demonstrate no significant findings. An enteric tube terminates in the stomach. Lungs/Pleura: There is atelectatic collapse of the left lower lobe. There is mild right basilar atelectasis. Moderate to severe centrilobular and paraseptal emphysema is noted. There is no pneumothorax or pleural effusion. Musculoskeletal: Anterior wedge deformities of T7 and T8 are unchanged since 06/17/2020. Review of the MIP images confirms the above findings. CT ABDOMEN and PELVIS FINDINGS Hepatobiliary: No focal liver abnormality is seen. No gallstones, gallbladder wall thickening, or biliary dilatation. Pancreas: Unremarkable. No pancreatic ductal dilatation or surrounding inflammatory changes. Spleen: Normal in size without focal abnormality. Adrenals/Urinary Tract: Adrenal glands are unremarkable. Kidneys are normal, without renal calculi, focal lesion, or hydronephrosis. Gas in the urinary bladder may represent recent instrumentation. Stomach/Bowel: Stomach is within normal limits. There is a large amount of stool in the rectum which may reflect impaction. There is edema located posterior to the rectum and anterior to the sacrum in the presacral space. Appendix appears normal. No evidence of bowel wall thickening, distention, or inflammatory changes. Vascular/Lymphatic: Aortic atherosclerosis. No enlarged abdominal or pelvic lymph nodes. Reproductive: The  bilateral adnexa are unremarkable. The uterus is either atrophic or absent. Other: No abdominal wall hernia. Musculoskeletal: Degenerative  changes are seen in the spine, most severe at L5-S1. Review of the MIP images confirms the above findings. IMPRESSION: 1.  No evidence of pulmonary embolism. 2. Aspirated debris in the trachea and bilateral lower lobe bronchi with atelectatic collapse of the left lower lobe. 3. Large stool burden in the rectum with edema in the presacral space may reflect proctitis. Aortic Atherosclerosis (ICD10-I70.0) and Emphysema (ICD10-J43.9). Electronically Signed   By: Tyler  Litton M.D.   On: 07/09/2020 17:11   CT Cervical Spine Wo Contrast  Result Date: 06/10/2020 CLINICAL DATA:  Head trauma.  Fall several days ago. EXAM: CT HEAD WITHOUT CONTRAST CT CERVICAL SPINE WITHOUT CONTRAST TECHNIQUE: Multidetector CT imaging of the head and cervical spine was performed following the standard protocol without intravenous contrast. Multiplanar CT image reconstructions of the cervical spine were also generated. COMPARISON:  04/11/2014 FINDINGS: CT HEAD FINDINGS Brain: No evidence of acute infarction, hemorrhage, hydrocephalus, extra-axial collection or mass lesion/mass effect. Vascular: No hyperdense vessel or unexpected calcification. Skull: Normal. Negative for fracture or focal lesion. Sinuses/Orbits: Opacification of the maxillary sinuses, ethmoid air cells noted. Sphenoid sinus mucosal thickening. Mastoid air cells are clear. Other: None CT CERVICAL SPINE FINDINGS Alignment: 4 mm anterolisthesis of L3 on L4 is identified, image 28/6. Skull base and vertebrae: No acute fracture. No primary bone lesion or focal pathologic process. Soft tissues and spinal canal: No prevertebral fluid or swelling. No visible canal hematoma. Disc levels: Marked multi level spondylosis identified throughout the cervical spine. Most advanced at C3-4, C4-5, C5-6 and C6-7. Bilateral facet arthropathy is also noted. Upper chest: Large right pleural effusion identified with opacification of the right upper lobe. Other: None IMPRESSION: 1. No acute  intracranial abnormalities. 2. Chronic sinus inflammation. 3. No evidence for cervical spine fracture. 4. Advanced cervical spondylosis. 5. Large right pleural effusion with opacification of the right upper lobe. Electronically Signed   By: Taylor  Stroud M.D.   On: 06/20/2020 13:26   MR BRAIN WO CONTRAST  Result Date: 07/08/2020 CLINICAL DATA:  Mental status change. EXAM: MRI HEAD WITHOUT CONTRAST TECHNIQUE: Multiplanar, multiecho pulse sequences of the brain and surrounding structures were obtained without intravenous contrast. COMPARISON:  Head CT 07/07/2020 FINDINGS: Some sequences are moderately to severely motion degraded despite use of faster, more motion resistant imaging protocols and attempts at repeat imaging. Brain: There is no evidence of an acute infarct, intracranial hemorrhage, mass, midline shift, or extra-axial fluid collection. The ventricles and sulci are within normal limits for age. A subcentimeter T2 hyperintensity at the posterior aspect of the right lentiform nucleus may reflect a chronic lacunar infarct or dilated perivascular space. No significant white matter disease is evident elsewhere for age within limitations of motion. Vascular: Major intracranial vascular flow voids are preserved. Skull and upper cervical spine: No gross skull lesion. Sinuses/Orbits: Grossly unremarkable orbits. Subtotal opacification of the maxillary sinuses by combination of fluid and mucosal thickening. Moderate volume fluid in the left sphenoid sinus. Mild mucosal thickening in the frontal and ethmoid sinuses. Small left mastoid effusion. Other: 1.1 cm T2 hypointense nodule along the posterior margin of the right parotid tail. IMPRESSION: 1. No acute intracranial abnormality identified on this motion degraded study. 2. Sinusitis. 3. 1.1 cm nodule along the right parotid tail, indeterminate though may reflect a lymph node or small parotid neoplasm. Electronically Signed   By: Allen  Grady M.D.   On:    07/08/2020 14:19   CT ABDOMEN PELVIS W CONTRAST  Result Date: 07/09/2020 CLINICAL DATA:  Tachypnea, hypoxia, fever. EXAM: CT ANGIOGRAPHY CHEST CT ABDOMEN AND PELVIS WITH CONTRAST TECHNIQUE: Multidetector CT imaging of the chest was performed using the standard protocol during bolus administration of intravenous contrast. Multiplanar CT image reconstructions and MIPs were obtained to evaluate the vascular anatomy. Multidetector CT imaging of the abdomen and pelvis was performed using the standard protocol during bolus administration of intravenous contrast. CONTRAST:  100mL OMNIPAQUE IOHEXOL 350 MG/ML SOLN COMPARISON:  CT chest dated 06/17/2020. FINDINGS: CTA CHEST FINDINGS Cardiovascular: Satisfactory opacification of the pulmonary arteries to the segmental level. No evidence of pulmonary embolism. Vascular calcifications are seen in the aortic arch. Normal heart size. No pericardial effusion. A right internal jugular central venous catheter terminates in the superior vena cava. Mediastinum/Nodes: No enlarged mediastinal, hilar, or axillary lymph nodes. Aspirated debris is seen in the trachea and bilateral lower lobe bronchi. Thyroid gland and esophagus demonstrate no significant findings. An enteric tube terminates in the stomach. Lungs/Pleura: There is atelectatic collapse of the left lower lobe. There is mild right basilar atelectasis. Moderate to severe centrilobular and paraseptal emphysema is noted. There is no pneumothorax or pleural effusion. Musculoskeletal: Anterior wedge deformities of T7 and T8 are unchanged since 06/22/2020. Review of the MIP images confirms the above findings. CT ABDOMEN and PELVIS FINDINGS Hepatobiliary: No focal liver abnormality is seen. No gallstones, gallbladder wall thickening, or biliary dilatation. Pancreas: Unremarkable. No pancreatic ductal dilatation or surrounding inflammatory changes. Spleen: Normal in size without focal abnormality. Adrenals/Urinary Tract: Adrenal  glands are unremarkable. Kidneys are normal, without renal calculi, focal lesion, or hydronephrosis. Gas in the urinary bladder may represent recent instrumentation. Stomach/Bowel: Stomach is within normal limits. There is a large amount of stool in the rectum which may reflect impaction. There is edema located posterior to the rectum and anterior to the sacrum in the presacral space. Appendix appears normal. No evidence of bowel wall thickening, distention, or inflammatory changes. Vascular/Lymphatic: Aortic atherosclerosis. No enlarged abdominal or pelvic lymph nodes. Reproductive: The bilateral adnexa are unremarkable. The uterus is either atrophic or absent. Other: No abdominal wall hernia. Musculoskeletal: Degenerative changes are seen in the spine, most severe at L5-S1. Review of the MIP images confirms the above findings. IMPRESSION: 1.  No evidence of pulmonary embolism. 2. Aspirated debris in the trachea and bilateral lower lobe bronchi with atelectatic collapse of the left lower lobe. 3. Large stool burden in the rectum with edema in the presacral space may reflect proctitis. Aortic Atherosclerosis (ICD10-I70.0) and Emphysema (ICD10-J43.9). Electronically Signed   By: Tyler  Litton M.D.   On: 07/09/2020 17:11   CT CHEST ABDOMEN PELVIS W CONTRAST  Result Date: 06/12/2020 CLINICAL DATA:  Fall several days ago EXAM: CT CHEST, ABDOMEN, AND PELVIS WITH CONTRAST TECHNIQUE: Multidetector CT imaging of the chest, abdomen and pelvis was performed following the standard protocol during bolus administration of intravenous contrast. CONTRAST:  100mL OMNIPAQUE IOHEXOL 300 MG/ML  SOLN COMPARISON:  None. FINDINGS: CT CHEST FINDINGS Cardiovascular: Aortic atherosclerosis. Normal heart size. Three-vessel coronary artery calcifications. No pericardial effusion. Mediastinum/Nodes: No enlarged mediastinal, hilar, or axillary lymph nodes. Thyroid gland, trachea, and esophagus demonstrate no significant findings.  Lungs/Pleura: Moderate to severe centrilobular emphysema. Large right pleural effusion and associated atelectasis or consolidation. No displaced rib fracture or other evident etiology. Musculoskeletal: No chest wall mass. There are wedge deformities of the T7 and T8 vertebral bodies, proximally 30% anterior height loss, which are new   compared to most recent imaging of the chest dated 09/29/2014 but age indeterminate (series 7, image 138). CT ABDOMEN PELVIS FINDINGS Hepatobiliary: No solid liver abnormality is seen. No gallstones, gallbladder wall thickening, or biliary dilatation. Pancreas: Unremarkable. No pancreatic ductal dilatation or surrounding inflammatory changes. Spleen: Normal in size without significant abnormality. Adrenals/Urinary Tract: Adrenal glands are unremarkable. Kidneys are normal, without renal calculi, solid lesion, or hydronephrosis. Foley catheter in the urinary bladder. Stomach/Bowel: Stomach is within normal limits. Esophagogastric tube tip and side port below the diaphragm. Appendix appears normal. No evidence of bowel wall thickening, distention, or inflammatory changes. Large burden of stool throughout the colon. Vascular/Lymphatic: Aortic atherosclerosis. No enlarged abdominal or pelvic lymph nodes. Reproductive: No mass or other abnormality. Other: No abdominal wall hernia or abnormality. No abdominopelvic ascites. Musculoskeletal: No acute or significant osseous findings. IMPRESSION: 1. No definite CT evidence of acute traumatic injury to the chest, abdomen, or pelvis. 2. Large right pleural effusion and associated atelectasis or consolidation. No displaced rib fracture or other evident etiology. 3. There are wedge deformities of the T7 and T8 vertebral bodies, proximally 30% anterior height loss, which are new compared to most recent imaging of the chest dated 09/29/2014 but age indeterminate. Correlate for acute point tenderness. 4. Emphysema. 5. Coronary artery disease. Aortic  Atherosclerosis (ICD10-I70.0) and Emphysema (ICD10-J43.9). Electronically Signed   By: Eddie Candle M.D.   On: 06/27/2020 13:26   DG Chest Port 1 View  Result Date: 07/10/2020 CLINICAL DATA:  Respiratory distress. EXAM: PORTABLE CHEST 1 VIEW COMPARISON:  Radiograph earlier today, chest CT yesterday. FINDINGS: Endotracheal tube tip is 6.4 cm from the carina. Enteric tube tip below the diaphragm in the stomach. Right internal jugular central venous catheter tip overlies the lower SVC. Improved aeration of the left lower lobe with likely re-expansion, mild residual atelectasis. Similar right lung base atelectasis. Stable heart size and mediastinal contours. No pneumothorax. Minimal blunting of the costophrenic angles may represent small effusions. IMPRESSION: 1. Improved aeration of the left lower lobe with likely re-expansion, mild residual atelectasis. Similar right lung base atelectasis. 2. Possible small pleural effusions. 3. Endotracheal tube tip is 6.4 cm from the carina. Enteric tube and right central line remain in place Electronically Signed   By: Keith Rake M.D.   On: 07/10/2020 15:55   DG Chest Port 1 View  Result Date: 07/09/2020 CLINICAL DATA:  Acute respiratory failure EXAM: PORTABLE CHEST 1 VIEW COMPARISON:  07/08/2020 FINDINGS: There is left lower lobe collapse with are lucency of the left hemithorax secondary to hyperinflation of the left upper lobe. Resultant left-sided volume loss. Coarsening of the interstitium within the lung apices is in keeping with moderate emphysema better appreciated on prior CT examination of 07/07/2020. No pneumothorax or pleural effusion. Cardiac size within normal limits. Right internal jugular central venous catheter noted within the superior vena cava. Degenerative changes are seen within the left shoulder. IMPRESSION: Interval left lower lobe collapse. Moderate emphysema. Electronically Signed   By: Fidela Salisbury MD   On: 07/09/2020 04:51   DG Chest Port  1 View  Result Date: 07/08/2020 CLINICAL DATA:  Acute respiratory failure EXAM: PORTABLE CHEST 1 VIEW COMPARISON:  07/06/2020 FINDINGS: Endotracheal tube and nasogastric tube have been removed. Pulmonary insufflation is stable. Minimal left basilar atelectasis. Previously noted left pleural effusion has resolved. No pneumothorax or pleural effusion. Right internal jugular central venous catheter is unchanged with its tip at the superior vena cava. Cardiac size within normal limits. Pulmonary vascularity is normal.  IMPRESSION: Interval extubation with stable pulmonary hypoinflation. Resolved left pleural effusion. Electronically Signed   By: Fidela Salisbury MD   On: 07/08/2020 03:56   DG Chest Port 1 View  Result Date: 07/05/2020 CLINICAL DATA:  Respiratory distress respiratory failure EXAM: PORTABLE CHEST 1 VIEW COMPARISON:  07/03/2020 FINDINGS: No significant change in AP portable chest radiograph, with right-sided pigtail chest tube. No pneumothorax or acute airspace opacity. Support apparatus including endotracheal tube, esophagogastric tube, and right neck vascular catheter in appropriate position. IMPRESSION: No significant change in AP portable chest radiograph, with right-sided pigtail chest tube. No pneumothorax or acute airspace opacity. Electronically Signed   By: Eddie Candle M.D.   On: 07/05/2020 11:01   DG Chest Port 1 View  Result Date: 07/03/2020 CLINICAL DATA:  Respiratory failure EXAM: PORTABLE CHEST 1 VIEW COMPARISON:  06/17/2020 FINDINGS: Endotracheal tube is seen 4.7 cm above the carina. Nasogastric tube tip overlies the proximal body of the stomach. Right internal jugular central venous catheter tip overlies the superior vena cava. Right basilar pigtail chest tube is unchanged. Pulmonary insufflation is stable. Bibasilar pulmonary infiltrate has improved. Stable partial right lower lobe collapse. No pneumothorax or pleural effusion. Cardiac size within normal limits. Pulmonary  vascularity is normal. IMPRESSION: Stable support lines and tubes. Improving bibasilar pulmonary infiltrate. Right chest tube in place.  No pneumothorax. Stable partial right lower lobe collapse. Electronically Signed   By: Fidela Salisbury MD   On: 07/03/2020 02:36   DG Chest Port 1 View  Result Date: 06/16/2020 CLINICAL DATA:  Chest tube NG tube EXAM: PORTABLE CHEST 1 VIEW COMPARISON:  06/26/2020 FINDINGS: Endotracheal tube tip is about 6 cm superior to the carina. Esophageal tube tip overlies the proximal stomach, side-port in the region of the distal esophagus. Right IJ central venous catheter tip over the SVC. Interim placement of right-sided drainage catheter over the lower chest with decreased right pleural effusion. Small residual right pleural effusion without visible pneumothorax. Persistent airspace disease at the right middle lobe and right base. IMPRESSION: 1. Endotracheal tube tip about 6 cm superior to carina 2. Esophageal tube tip with side-port in the region of distal esophagus, consider further advancement for more optimal positioning 3. Interim placement of right-sided chest tube with decreased right pleural effusion. Small residual right pleural effusion with airspace disease in the right mid lung and consolidation at the right middle lobe and right base. Electronically Signed   By: Donavan Foil M.D.   On: 06/10/2020 18:45   DG Chest Portable 1 View  Result Date: 06/15/2020 CLINICAL DATA:  Status post intubation. EXAM: PORTABLE CHEST 1 VIEW COMPARISON:  09/29/2014 FINDINGS: ETT tip above the carina. There is an NG tube with tip below the GE junction. Moderate to large right pleural effusion. Atelectasis and airspace opacification of the right lower lobe and right middle lobes noted. Diffuse pulmonary vascular congestion noted. The visualized osseous structures are unremarkable. IMPRESSION: 1. Support apparatus positioned as above. 2. Moderate to large right pleural effusion with associated  atelectasis and airspace opacification of the right lower lobe and right middle lobes. 3. Pulmonary vascular congestion. Electronically Signed   By: Kerby Moors M.D.   On: 06/11/2020 11:03   ECHOCARDIOGRAM COMPLETE  Result Date: 06/27/2020    ECHOCARDIOGRAM REPORT   Patient Name:   JNIYAH DANTUONO Date of Exam: 06/16/2020 Medical Rec #:  [id number]      Height:       65.0 in Accession #:    [id number]  Weight:       154.5 lb Date of Birth:  12/23/1955      BSA:          1.773 m Patient Age:    64 years       BP:           88/64 mmHg Patient Gender: F              HR:           107 bpm. Exam Location:  [facility] Procedure: 2D Echo, Color Doppler, Cardiac Doppler and Intracardiac            Opacification Agent Indications:     Acute respiratory distress R06.03  History:         Patient has no prior history of Echocardiogram examinations.                  COPD; Risk Factors:Hypertension. Panic disorder.  Sonographer:     Jerry Hege RDCS (AE) Referring Phys:  [id number] PRAVEEN MANNAM Diagnosing Phys: Timothy Gollan MD  Sonographer Comments: Echo performed with patient supine and on artificial respirator. IMPRESSIONS  1. Challenging images  2. Left ventricular ejection fraction, by estimation, is 60 to 65%. The left ventricle has normal function. The left ventricle has no regional wall motion abnormalities. Left ventricular diastolic parameters are consistent with Grade I diastolic dysfunction (impaired relaxation).  3. Right ventricular systolic function is normal. The right ventricular size is normal. There is normal pulmonary artery systolic pressure. The estimated right ventricular systolic pressure is 24.9 mmHg.  4. The mitral valve was not well visualized. No evidence of mitral valve regurgitation. No evidence of mitral stenosis. Moderate mitral annular calcification. FINDINGS  Left Ventricle: Left ventricular ejection fraction, by estimation, is 60 to 65%. The left ventricle has normal function. The left ventricle  has no regional wall motion abnormalities. Definity contrast agent was given IV to delineate the left ventricular  endocardial borders. The left ventricular internal cavity size was normal in size. There is no left ventricular hypertrophy. Left ventricular diastolic parameters are consistent with Grade I diastolic dysfunction (impaired relaxation). Right Ventricle: The right ventricular size is normal. No increase in right ventricular wall thickness. Right ventricular systolic function is normal. There is normal pulmonary artery systolic pressure. The tricuspid regurgitant velocity is 2.23 m/s, and  with an assumed right atrial pressure of 5 mmHg, the estimated right ventricular systolic pressure is 24.9 mmHg. Left Atrium: Left atrial size was not well visualized. Right Atrium: Right atrial size was not well visualized. Pericardium: There is no evidence of pericardial effusion. Mitral Valve: The mitral valve was not well visualized. Moderate mitral annular calcification. No evidence of mitral valve regurgitation. No evidence of mitral valve stenosis. Tricuspid Valve: The tricuspid valve is not well visualized. Tricuspid valve regurgitation is mild . No evidence of tricuspid stenosis. Aortic Valve: The aortic valve was not well visualized. Aortic valve regurgitation is not visualized. No aortic stenosis is present. Aortic valve mean gradient measures 4.0 mmHg. Aortic valve peak gradient measures 6.7 mmHg. Aortic valve area, by VTI measures 2.74 cm. Pulmonic Valve: The pulmonic valve was normal in structure. Pulmonic valve regurgitation is not visualized. No evidence of pulmonic stenosis. Aorta: The aortic root is normal in size and structure. Venous: The pulmonary veins were not well visualized. The inferior vena cava is normal in size with greater than 50% respiratory variability, suggesting right atrial pressure of 3 mmHg. IAS/Shunts: No atrial level shunt detected by   color flow Doppler.  LEFT VENTRICLE PLAX 2D  LVIDd:         3.30 cm  Diastology LVIDs:         1.61 cm  LV e' medial:    4.03 cm/s LV PW:         1.66 cm  LV E/e' medial:  19.8 LV IVS:        0.88 cm  LV e' lateral:   4.03 cm/s LVOT diam:     2.00 cm  LV E/e' lateral: 19.8 LV SV:         81 LV SV Index:   46 LVOT Area:     3.14 cm  RIGHT VENTRICLE RV Basal diam:  3.35 cm RV S prime:     16.80 cm/s TAPSE (M-mode): 4.7 cm LEFT ATRIUM           Index       RIGHT ATRIUM           Index LA diam:      2.50 cm 1.41 cm/m  RA Area:     12.90 cm LA Vol (A2C): 40.0 ml 22.56 ml/m RA Volume:   29.90 ml  16.87 ml/m LA Vol (A4C): 63.6 ml 35.88 ml/m  AORTIC VALVE                   PULMONIC VALVE AV Area (Vmax):    2.84 cm    PV Vmax:        0.33 m/s AV Area (Vmean):   2.80 cm    PV Peak grad:   0.4 mmHg AV Area (VTI):     2.74 cm    RVOT Peak grad: 2 mmHg AV Vmax:           129.50 cm/s AV Vmean:          91.050 cm/s AV VTI:            0.298 m AV Peak Grad:      6.7 mmHg AV Mean Grad:      4.0 mmHg LVOT Vmax:         117.00 cm/s LVOT Vmean:        81.200 cm/s LVOT VTI:          0.259 m LVOT/AV VTI ratio: 0.87  AORTA Ao Root diam: 3.10 cm MITRAL VALVE                TRICUSPID VALVE MV Area (PHT): 2.62 cm     TR Peak grad:   19.9 mmHg MV Decel Time: 290 msec     TR Vmax:        223.00 cm/s MV E velocity: 79.70 cm/s MV A velocity: 107.00 cm/s  SHUNTS MV E/A ratio:  0.74         Systemic VTI:  0.26 m                             Systemic Diam: 2.00 cm Timothy Gollan MD Electronically signed by Timothy Gollan MD Signature Date/Time: 07/06/2020/5:21:34 PM    Final     Assessment and plan-   Persistent leukocytosis, Pathology smear showed normal morphology of RBC, WBC and platelet. Likely leukemoid reaction secondary to pneumonia/respiratory failure/recent IV steroid use. I will check flow cytometry.  Acute respiratory failure/COPD exacerbation/pneumonia/questionable proctitis Procalcitonin 0.22, trending up Management per ICU team.  Currently on cefepime and  linezolid.  Continue DVT prophylaxis   ,   MD, PhD Hematology Oncology [facility] Pager- [phone] 07/10/2020

## 2020-07-10 NOTE — Progress Notes (Signed)
Nutrition Follow-up  DOCUMENTATION CODES:  Not applicable  INTERVENTION:  Restart TF today. Recommend the following: Osmolite 1.5 at 31mL/h, 2 packets of prosource TF daily (40kcal and 11g of protein per packet). Start at 64mL/h, advance by 42mL q12h until goal rate is achieved. Free water flush: 178mL q4h TF regimen provides 1700kcal, 90g of protein, and 1732mL of free water (flushes +TF) MVI via tube daily, thiamine per MD for AMS  NUTRITION DIAGNOSIS:  Inadequate oral intake related to inability to eat (ventilated) as evidenced by NPO status.  GOAL:  Provide needs based on ASPEN/SCCM guidelines  MONITOR:  TF tolerance, I & O's, Vent status, Labs  REASON FOR ASSESSMENT:  Ventilator    ASSESSMENT:  65 year old female with history of COPD, depression, anxiety, hypertension and panic attacks who is admitted with sepsis and PNA requiring intubation for respiratory failure  Pt with pleural effusion s/p chest tube placement and intubation 6/23 Pt extubated 6/27. Pt pulled NGT Diet advanced to DYS 1 6/28 NGT replaced due to AMS 6/30 Increased WOB, reintubated 7/1  Pt intubated and sedated this AM. Discussed in IDU rounds, pt required intubation early this AM and is currently on pressor support x2. TF held last night after extubation due to hemodynamic instability. Vasopressin at medium dose and stable rate, norepinephrine at high rate but slowly being weaned. Most recent MAP documented at 34.   Discussed feeds with MD and RN. Plan to restart this afternoon as pt stabilizes. When feeds are reinitiated, would recommend starting at low rate and advancing slowly to monitor for tolerance and to continue to allow for stability.   NGT in place, confirmed gastric 6/30  Patient is currently intubated on ventilator support MV: 12.6 L/min Temp (24hrs), Avg:98.9 F (37.2 C), Min:97.9 F (36.6 C), Max:100.4 F (38 C)   Intake/Output Summary (Last 24 hours) at 07/10/2020 1130 Last data  filed at 07/10/2020 1100 Gross per 24 hour  Intake 1921.31 ml  Output 1300 ml  Net 621.31 ml   Net IO Since Admission: -8,622.15 mL [07/10/20 1130]  Average Meal Intake: 6/28: 50% intake x 1 meal  Nutritionally Relevant Medications: Scheduled Meds:  feeding supplement (PROSource TF)  45 mL Per Tube BID   free water  200 mL Per Tube Q4H   multivitamin  15 mL Oral Daily   pantoprazole (PROTONIX) IV  40 mg Intravenous QHS   polyethylene glycol  17 g Oral Daily   senna  2 tablet Oral QHS   [START ON 07/13/2020] thiamine injection  100 mg Intravenous Q24H   vancomycin  500 mg Per Tube Q6H   vecuronium       Continuous Infusions:  dexmedetomidine (PRECEDEX) IV infusion 0.3 mcg/kg/hr (07/10/20 1100)   feeding supplement (OSMOLITE 1.5 CAL) 20 mL/hr at 07/09/20 1827   fentaNYL infusion INTRAVENOUS 100 mcg/hr (07/10/20 1100)   midazolam 10 mg/hr (07/10/20 1100)   norepinephrine (LEVOPHED) Adult infusion 34 mcg/min (07/10/20 1100)   thiamine injection     vasopressin 0.03 Units/min (07/10/20 1100)   PRN Meds: docusate, polyethylene glycol, sodium phosphate  Labs Reviewed: BUN 76, creatinine 2.07 Phosphorus 8.3 Mg 3.0 SBG ranges from 106-150 mg/dL over the last 24 hours  NUTRITION - FOCUSED PHYSICAL EXAM: Flowsheet Row Most Recent Value  Orbital Region No depletion  Upper Arm Region No depletion  Thoracic and Lumbar Region No depletion  Buccal Region No depletion  Temple Region No depletion  Clavicle Bone Region No depletion  Clavicle and Acromion Bone Region No depletion  Scapular Bone Region No depletion  Dorsal Hand No depletion  Patellar Region Moderate depletion  Anterior Thigh Region Moderate depletion  Posterior Calf Region Moderate depletion  Edema (RD Assessment) Mild   Hair Reviewed  Eyes Reviewed  Mouth Reviewed  Skin Reviewed  Nails Reviewed   Diet Order:   Diet Order             Diet NPO time specified  Diet effective now                   EDUCATION NEEDS:  No education needs have been identified at this time  Skin:  Skin Assessment: Reviewed RN Assessment (ecchymosis)  Last BM:  7/1 - type 6  Height:  Ht Readings from Last 1 Encounters:  07/03/20 5\' 5"  (1.651 m)   Weight:  Wt Readings from Last 1 Encounters:  07/10/20 59.6 kg    Ideal Body Weight:  56.8 kg  BMI:  Body mass index is 21.87 kg/m.  Estimated Nutritional Needs:  Kcal:  1635 kcal/d Protein:  85-100g/d Fluid:  1.7-1.8L/d  Ranell Patrick, RD, LDN Clinical Dietitian Pager on [place]

## 2020-07-10 NOTE — Consult Note (Signed)
Pharmacy Antibiotic Note  Tamara Mcclure is a 65 y.o. female admitted on 06/11/2020. Patient was intubated from 6/23 to 6/27 and then required reintubation 7/1 for airway protection after failing BiPAP and HHFNC. Patient with h/o COPD and currently being treated for pseudomonas and MRSA pneumonia. Following initial extubation, patient with very minimal interaction. Concern for possible lingering effects of multiple sedating medications PTA. Neurology and Psych involved. Cefepime and linezolid transitioned to vancomycin and ceftazidime with concern for possible neurotoxicity.  Plan: Scr doubled, possibly s/t dehydration for which patient has received fluid resuscitation. Patient received linezolid and cefepime this morning. Start ceftazidime 2 g IV q24h tonight. Give vancomycin 1500 mg (25 mg/kg) tonight. Plan to assess renal function with morning labs and determine need for level based on creatinine trend and urine output.  Height: 5\' 5"  (165.1 cm) Weight: 59.6 kg (131 lb 6.3 oz) IBW/kg (Calculated) : 57  Temp (24hrs), Avg:98.6 F (37 C), Min:97.9 F (36.6 C), Max:100.4 F (38 C)  Recent Labs  Lab 07/05/20 0520 07/06/20 0520 07/07/20 0200 07/08/20 0441 07/08/20 2043 07/09/20 0550 07/09/20 0850 07/10/20 0439  WBC 17.2*  --  23.6*  --   --  47.4* 51.0* 88.9*  CREATININE 0.61   < > 0.50 0.66 0.80  --  1.01* 2.07*   < > = values in this interval not displayed.     Estimated Creatinine Clearance: 25 mL/min (A) (by C-G formula based on SCr of 2.07 mg/dL (H)).    Allergies  Allergen Reactions   Penicillins Rash    Has patient had a PCN reaction causing immediate rash, facial/tongue/throat swelling, SOB or lightheadedness with hypotension: Yes Has patient had a PCN reaction causing severe rash involving mucus membranes or skin necrosis: No Has patient had a PCN reaction that required hospitalization: No Has patient had a PCN reaction occurring within the last 10 years: No If all of  the above answers are "NO", then may proceed with Cephalosporin use.    Antimicrobials this admission: Vancomycin 6/23 >> 6/27 Metronidazole 6/23 >> 6/27 Cefepime 6/23 >> 7/1 Linezolid 6/27 >> 7/1 Ceftazidime 7/1 >> Vancomycin 7/1 >>   Microbiology results: 6/23 BCx: NGTD 6/23 UCx: no growth 6/23 Trach aspirate: MRSA, pseudomonas 6/23 pleural fluid: no growth 6/23 MRSA PCR: positive  COVID/FLU NEG  Thank you for allowing pharmacy to be a part of this patient's care.  Dorena Bodo, PharmD 07/10/2020 12:15 PM

## 2020-07-10 NOTE — Progress Notes (Signed)
Eeg done 

## 2020-07-10 NOTE — Procedures (Signed)
Arterial Catheter Insertion Procedure Note  Tamara Mcclure  [id number]  03-28-1955  Date:07/10/20  Time:4:55 PM    Provider Performing: Bradly Bienenstock    Procedure: Insertion of Arterial Line [phone]) with US guidance ([id number])   Indication(s) Blood pressure monitoring and/or need for frequent ABGs  Consent Unable to obtain consent due to emergent nature of procedure.  Anesthesia None   Time Out Verified patient identification, verified procedure, site/side was marked, verified correct patient position, special equipment/implants available, medications/allergies/relevant history reviewed, required imaging and test results available.   Sterile Technique Maximal sterile technique including full sterile barrier drape, hand hygiene, sterile gown, sterile gloves, mask, hair covering, sterile ultrasound probe cover (if used).   Procedure Description Area of catheter insertion was cleaned with chlorhexidine and draped in sterile fashion. With real-time ultrasound guidance an arterial catheter was placed into the right femoral artery.  Appropriate arterial tracings confirmed on monitor.     Complications/Tolerance None; patient tolerated the procedure well.   EBL Minimal   Specimen(s) None  BIOPATCH applied to the insertion site.   Tamara Mcclure, Tamara Mcclure  Pulmonary & Critical Care Prefer epic messenger for cross cover needs If after hours, please call E-link

## 2020-07-10 NOTE — Progress Notes (Signed)
SLP Cancellation Note  Patient Details Name: Tamara Mcclure MRN: [id number] DOB: 1955-02-20   Cancelled treatment:       Reason Eval/Treat Not Completed: Medical issues which prohibited therapy  Pt is currently on ventilator support. Please re-consult when pt is appropriate.   Mahayla Haddaway B. Rutherford Nail M.S., CCC-SLP, [place] Office [phone]   Lisia [place]bay Rutherford Nail 07/10/2020, 8:22 AM

## 2020-07-10 NOTE — Care Management (Signed)
Pt's chart reviewed. Events from last nite noted. Pt re-intubated and will be under ICU service. TRH will sign off.

## 2020-07-10 NOTE — Procedures (Signed)
Patient Name: KALEIGH SPIEGELMAN  MRN: [id number]  Epilepsy Attending: Lora Havens  Referring Physician/Provider: Darel Hong, NP Date: 07/10/2020 Duration: 29.53 mins  Patient history: 65 year old female with altered mental status.  EEG to evaluate for seizures.  Level of alertness: comatose  AEDs during EEG study: Versed, Xanax  Technical aspects: This EEG study was done with scalp electrodes positioned according to the 10-20 International system of electrode placement. Electrical activity was acquired at a sampling rate of 500Hz  and reviewed with a high frequency filter of 70Hz  and a low frequency filter of 1Hz . EEG data were recorded continuously and digitally stored.   Description: EEG showed continuous generalized 5 to 7 Hz theta as well as intermittent 2-3Hz  delta slowing. Hyperventilation and photic stimulation were not performed.     ABNORMALITY - Continuous slow, generalized  IMPRESSION: This study is suggestive of moderate to severe diffuse encephalopathy, nonspecific etiology. No seizures or epileptiform discharges were seen throughout the recording.   Kabe Mckoy Barbra Sarks

## 2020-07-10 NOTE — Progress Notes (Addendum)
Patient remained tachypneic with respiration in the upper 50s despite administration of morphine and Lorazepam for anxiety. She was also noted to be hypoxic and unable to keep sats>88%. NTS attempted multiple times with no improvement. Patient was placed on BiPAP with no improvement. HHNF was also attempted with no improvement in sats. Given patient with increased work of breathing with steady decline in respiratory status placing her at risk for respiratory arrest, decision was made to re-intubate for airway protection. Plan of care discussed with on call attending Dr. Mortimer Fries who agreed with re-intubating patient for airway protection. Patient's son [place] Nodine was also informed of the decision to re-intubate, he verbalized understanding and consented to proceed with procedure.  Assessment & Plan 65 y.o female with significant hx of COPD admitted with Acute on chronic respiratory failure due to COPD exacerbation, right lower lobe pneumonia (Pseudomonas & MRSA), & large Right Pleural Effusion, septic shocksecondary to Pseudomonas & MRSA PneumoniaAcute Metabolic Encephalopathy secondary to sepsis and Hyponatremia concerning for ? Catatonia vs. Lingering effects of overdose on multiple sedating medications now complicated by worsening hypoxia with concerns for possible mucus plugging requiring re-intubation.  Acute on chronic respiratory failure due to COPD exacerbation, right lower lobe pneumonia (Pseudomonas & MRSA), & large Right Pleural Effusion s/p right chest tube 6/23 removed 6/28 S/p extubation 6/27, re-intubated 07/10/2020 - continue ventilator support & lung protective strategies - Wean PEEP & FiO2 as tolerated, maintain SpO2 > 90% - Head of bed elevated 30 degrees, VAP protocol in place - Plateau pressures less than 30 cm H20  - Intermittent chest x-ray & ABG PRN - Daily WUA with SBT as tolerated  - Ensure adequate pulmonary hygiene  - Pleural fluid consistent with EXUDATE (Lights Criteria:  Pleural LDH  979 is > 2/3 UNL of serum LDH (112) - Pleural fluid culture with no growth - F/u cultures, trend PCT - Continue cefepime 2 g IV q8h and Linezolid - Steroids d/cd due to severe leeukocytosis - bronchodilators scheduled and PRN - PAD protocol in place: continue Fentanyl IVP & Propofol drip     Rufina Falco, DNP, CCRN, FNP-C, AGACNP-BC Acute Care Nurse Practitioner  [facility] Pager: [phone] [place] at [facility]

## 2020-07-10 NOTE — Progress Notes (Addendum)
NAME:  Tamara Mcclure, MRN:  [id number], DOB:  Aug 10, 1955, LOS: 75 ADMISSION DATE:  06/14/2020, CONSULTATION DATE: 07/01/2020 REFERRING MD:  Lindell Noe MD, CHIEF COMPLAINT:   Acute respiratory failure, pneumonia  History of Present Illness:   65 year old with history of COPD, depression, hypertension, panic attacks presenting with altered mental status, acute on chronic respiratory failure.  Patient had a fall a few days ago, seen at local urgent care.  Has been taking increasing doses of opiate.  In the ED she failed Narcan and BiPAP and got intubated.  PCCM consulted for admission  Pertinent  Medical History    has a past medical history of Anxiety, Chronic lower back pain, COPD (chronic obstructive pulmonary disease) ([place]), Depression, Hypertension, Panic disorder with agoraphobia and severe panic attacks, and Peripheral sensory neuropathy.   Micro Data:  06/17/2020: SARS-CoV-2 and influenza PCR>> negative 06/11/2020: HIV>> nonreactive 06/23/2020: Strep pneumo urinary antigen>> negative 07/07/2020: Legionella urinary antigen>>negative 07/02/2021: Pleural fluid>> no growth 07/02/2021: Blood culture>> no growth 06/29/2020: MRSA PCR>> positive 06/20/2020: Tracheal aspirate>> Pseudomonas Aeruginosa & MRSA 07/02/2021: Urine>> no growth  Antimicrobials:  Cefepime 6/23>> 7/1 Ceftriaxone 6/24>> 6/25 Flagyl 6/23>> 6/27 Vancomycin 6/23>> 6/27; 7/1>> Linezolid 6/27>>7/1 PO Vancomycin 6/30>> Ceftazidime 7/1>>  3 significant Hospital Events: Including procedures, antibiotic start and stop dates in addition to other pertinent events   6/23-admit, intubated, right chest tube placed, right IJ CVC placed 6/24: Weaning vent settings and Levophed (down to 8 mcg). Na+ corrected to 124 this morning (108 on admission) ~ D5 @ 75 started. Tracheal aspirate with Gram+ cocci in pairs & rare gram + rods ~ Cefepime changed to Ceftriaxone, Azithromycin d/c.  Pleural fluid consistent with EXUDATE ~ pleural fluid  culture pending 6/26 remains on vent, critically ill 6/27 Successfully extubated  6/28: Chest tube removed, remains with AMS, CT Head negative 6/29: Remains altered, concern for ? Catatonia vs. ? Osmotic Demyelination Syndrome (serum Na+ noted to have rapidly corrected during 1st 24 hrs of admission), Obtain MRI Brain,  Psych consulted 6/30: MRI Brain yesterday normal, tracking today (intermittently follows commands), obtain Neurology consult. WBC increased to 51 (23.6) ~ Peripheral Smear Normal, steroids d/c, consider Hematology consult; get CTA Chest and CT Abdomen/Pelvis 7/1: Overnight with hypoxia and respiratory distress, required intubation.  ABX changed to Ceftazidime and Vancomycin.  Hematology consulted for severe Leukocytosis.  EEG pending  Interim History / Subjective:  -Overnight with Hypoxia and worsening respiratory distress  required emergent intubation -Afebrile, requiring Levophed and Vasopressin -WBC increased to 88K today (51 yesterday) despite d/c steroids yesterday ~ will consult Hematology -ABX changed to Ceftazidime and Vancomycin -Creatinine increased to 2.07 (consistent with pre-renal) and CVP 8 ~ will give 1L LR bolus and follow CVP response -Pt is critically ill with severe emphysema, poor prognosis ~ will consult Palliative Care  Objective   Blood pressure (!) 123/91, pulse (!) 117, temperature 97.9 F (36.6 C), temperature source Oral, resp. rate (!) 27, height 5' 5"  (1.651 m), weight 59.6 kg, SpO2 100 %.    Vent Mode: PRVC FiO2 (%):  [80 %-100 %] 80 % Set Rate:  [16 bmp] 16 bmp Vt Set:  [400 mL] 400 mL PEEP:  [10 cmH20] 10 cmH20 Pressure Support:  [5 cmH20] 5 cmH20 Plateau Pressure:  [25 cmH20] 25 cmH20   Intake/Output Summary (Last 24 hours) at 07/10/2020 0848 Last data filed at 07/10/2020 0800 Gross per 24 hour  Intake 1448.61 ml  Output 1150 ml  Net 298.61 ml    [name]  Weights   07/06/20 0500 07/08/20 0500 07/10/20 0500  Weight: 70.7 kg 70 kg 59.6 kg     Examination:  Gen:   Critically ill-appearing female, laying in bed, intubated and sedated, no acute distress HEENT: Atraumatic, normocephalic, EOMI, sclera anicteric Neck:     No masses; no thyromegaly, no JVD, ETT in place Lungs:    Coarse breath sounds throughout, no wheezing, overbreathing the vent, even CV:         Tachycardia, Regular rhythm; S1 S2, no M/R/G, good peripheral circulation Abd:      + bowel sounds; soft, non-tender; no palpable masses, no distension Ext:    No edema; adequate peripheral perfusion Skin:      Warm and dry; no obvious rashes, lesions, ulcerations Neuro: Sedated, unable to follow commands,  pupils PERRLA Psych: Unable to assess due to AMS/intubation/sedation  Labs/imaging that I havepersonally reviewed  (right click and "Reselect all SmartList Selections" daily)   Labs 07/09/2020: glucose 160, BUN 76, Cr. 2.07, PCT 1.18, WBC 88.9 (with Neutrophilia and mild left shift), Platelets 411 ABG: pH 7.39/pCO2 41/pO2 67/Bicarb 24.8  CTA Chest 6/30>>1.  No evidence of pulmonary embolism. 2. Aspirated debris in the trachea and bilateral lower lobe bronchi with atelectatic collapse of the left lower lobe. CT Abdomen/Pelvis w/ contrast 6/30>> Large stool burden in the rectum with edema in the presacral space may reflect proctitis. MRI Brain 6/29>>1. No acute intracranial abnormality identified on this motion degraded study. 2. Sinusitis. 3. 1.1 cm nodule along the right parotid tail, indeterminate though may reflect a lymph node or small parotid neoplasm. CT Head 6/28>>No acute abnormality. No evidence of cerebral edema or infarction. No intracranial hemorrhage CT Chest w/o contrast 6/28>>IMPRESSION: 1. Largely resolved right pleural effusion with pleural drain in place. Mild or trace residual fluid and/or thickening in the right costophrenic angle. 2. Small volume retained secretions in the airways. No convincing pneumonia. Emphysema (ICD10-J43.9). 3.  Aortic Atherosclerosis (ICD10-I70.0). CT head 6/23>>1. No acute intracranial abnormalities. 2. Chronic sinus inflammation. CT cervical spine 6/23>>No evidence for cervical spine fracture.  Advanced cervical spondylosis CT abdomen pelvis 6/23>>1. No definite CT evidence of acute traumatic injury to the chest, abdomen, or pelvis. 2. Large right pleural effusion and associated atelectasis or consolidation. No displaced rib fracture or other evident etiology. 3. There are wedge deformities of the T7 and T8 vertebral bodies, proximally 30% anterior height loss, which are new compared to most recent imaging of the chest dated 09/29/2014 but age indeterminate. Correlate for acute point tenderness. 4. Emphysema. 5. Coronary artery disease.  Resolved Hospital Problem list     Assessment & Plan:   Acute on chronic respiratory failure due to COPD exacerbation, right lower lobe pneumonia (Pseudomonas & MRSA), & large Right Pleural Effusion -Full vent support, implement lung protective strategies -Wean FiO2 & PEEP as tolerated to maintain O2 sats 88 to 92% -Follow intermittent Chest X-ray & ABG as needed -Spontaneous Breathing Trials when respiratory parameters met and mental status permits -Implement VAP Bundle -Bronchodilators -Steroids d/c due to severe Leukocytosis -ABX changed to Ceftazidime and Vancomycin -Recruitment Maneuvers -CTA Chest 6/30 negative for PE, concern for aspirated debris in trachea and bilateral LL bronchi, with atelectatic collapse of LLL, and severe COPD -Chest tube placement on 06/24/2020 ~ removed on 07/07/20 -Pleural fluid consistent with EXUDATE (Lights Criteria: Pleural LDH  979 is > 2/3 UNL of serum LDH (112) -Pleural fluid culture with no growth ~ consistent with PARAPNEUMONIC Effusion  Septic Shock -Continuous cardiac monitoring -Maintain  MAP greater than 65 -Vasopressors as needed to maintain MAP goal -Add Midodrine 10 mg TID -Check CVP, may need  additional IVF ~ CVP is 8 so will give 1L LR bolus and follow CVP response -Check serum cortisol -Echocardiogram on 06/16/2020 with LVEF 60 to 62%, grade 1 diastolic dysfunction, RV systolic function normal  Sepsis present on admission secondary to Pseudomonas & MRSA Pneumonia Severe Leukocytosis -Monitor fever curve -Trend WBCs and procalcitonin -Follow cultures as above -ABX changed to Ceftazidime, Vancomycin, along with PO Vancomycin -Peripheral smear on 6/30 with Morphology of RBCs, WBCs, and platelets within normal limits. -Steroids d/c on 6/30 -WBC increased to 88.9 K today 07/10/20 -Hematology consulted, appreciate input  Acute Kidney Injury (BUN : Creatinine ratio 38:1 consistent with Pre-Renal) Mild Hypernatremia due to poor PO intake>>resolved -Monitor I&O's / urinary output -Follow BMP  -Ensure adequate renal perfusion -Avoid nephrotoxic agents as able -Replace electrolytes as indicated -Pharmacy following for assistance with electrolyte replacement -Continue free water flushes/feeds -CVP noted to be 8 ~ will give 1L LR bolus and reassess CVP  Acute Metabolic Encephalopathy secondary to sepsis and Hyponatremia Concern for ? Catatonia vs. Lingering effects of overdose on multiple sedating medications Sedation needs in setting of mechanical ventilation PMHx of Chronic opiate abuse -Maintain a RASS goal of 0 to -1 -Fentanyl, Versed, and Precedex to maintain RASS goal -Avoid sedating medications as able -Daily wake up assessment -CT head 07/07/2020 & MRI Brain 07/08/20 both negative for acute intracranial abnormality -Psychiatry and Neurology following, appreciate input -EEG pending -Per discussion with Dr. Patsey Berthold, will check serum Thiamine and start high dose dose Thiamine supplementation -Cefepime switched to Ceftazidime, and Zyvox switched to Vancomycin  Best Practice (right click and "Reselect all SmartList Selections" daily)   Diet/type: NPO, tube  feeds Pain/Anxiety/Delirium protocol yes VAP protocol (if indicated): yes DVT prophylaxis: prophylactic heparin  GI prophylaxis: PPI Glucose control:  yes, SSI Central venous access:  yes, and is still indicated Arterial line:  N/A Foley:  N/A Mobility:  bed rest  PT consulted: N/A Studies pending: EEG Culture data pending: N/A Last reviewed culture data:today Antibiotics: Ceftazidime, Vancomycin, PO Vancomycin Antibiotic de-escalation:  N/A, on appropriate ABX Stop date: to be determined  Daily labs: requested Code Status:  full code Last date of multidisciplinary goals of care discussion [07/10/20] Disposition: remains critically ill, will stay in intensive care  Pt is critically ill, prognosis is extremely guarded, and long term prognosis is poor.  High risk for cardiac arrest and death.  Recommend DNR/DNI status.   Labs   CBC: Recent Labs  Lab 07/05/20 0520 07/07/20 0200 07/09/20 0550 07/09/20 0850 07/10/20 0439  WBC 17.2* 23.6* 47.4* 51.0* 88.9*  NEUTROABS  --   --   --  43.0* 78.1*  HGB 9.6* 10.3* 13.0 12.9 12.1  HCT 28.0* 29.5* 38.7 38.1 36.3  MCV 90.3 88.3 90.6 92.3 97.3  PLT 326 305 425* 392 411*     Basic Metabolic Panel: Recent Labs  Lab 07/05/20 0520 07/05/20 1130 07/06/20 0520 07/06/20 0813 07/07/20 0200 07/08/20 0441 07/08/20 2043 07/09/20 0850 07/09/20 1707  NA 133*   < > 135 135 137 138 143 147*  --   K 4.2  --  3.8  --  2.8* 2.9* 4.0 3.6  --   CL 100  --  103  --  94* 97* 103 107  --   CO2 26  --  27  --  32 32 29 27  --   GLUCOSE 162*  --  128*  --  103* 157* 155* 127*  --   BUN 16  --  22  --  21 25* 25* 41*  --   CREATININE 0.61  --  0.55  --  0.50 0.66 0.80 1.01*  --   CALCIUM 8.3*  --  8.0*  --  8.6* 8.6* 9.1 8.9  --   MG 2.0  --  1.8  --  2.0 2.2 2.5*  --  2.7*  PHOS 3.1  --  2.2*  --  1.7* 3.6  --   --  4.2   < > = values in this interval not displayed.    GFR: Estimated Creatinine Clearance: 51.3 mL/min (A) (by C-G formula  based on SCr of 1.01 mg/dL (H)). Recent Labs  Lab 07/07/20 0200 07/09/20 0550 07/09/20 0850 07/10/20 0439  PROCALCITON  --   --  0.22 1.18  WBC 23.6* 47.4* 51.0* 88.9*     Liver Function Tests: No results for input(s): AST, ALT, ALKPHOS, BILITOT, PROT, ALBUMIN in the last 168 hours.  No results for input(s): LIPASE, AMYLASE in the last 168 hours. No results for input(s): AMMONIA in the last 168 hours.  ABG    Component Value Date/Time   PHART 7.39 07/10/2020 0647   PCO2ART 41 07/10/2020 0647   PO2ART 67 (L) 07/10/2020 0647   HCO3 24.8 07/10/2020 0647   ACIDBASEDEF 0.2 07/10/2020 0647   O2SAT 92.8 07/10/2020 0647      Coagulation Profile: No results for input(s): INR, PROTIME in the last 168 hours.   Cardiac Enzymes: No results for input(s): CKTOTAL, CKMB, CKMBINDEX, TROPONINI in the last 168 hours.  HbA1C: Hgb A1c MFr Bld  Date/Time Value Ref Range Status  06/30/2020 01:25 PM 5.8 (H) 4.8 - 5.6 % Final    Comment:    (NOTE)         Prediabetes: 5.7 - 6.4         Diabetes: >6.4         Glycemic control for adults with diabetes: <7.0   03/11/2013 12:00 AM 5.8 4.0 - 6.0 % Final    CBG: Recent Labs  Lab 07/09/20 1625 07/09/20 1927 07/09/20 2303 07/10/20 0318 07/10/20 0756  GLUCAP 119* 128* 141* 150* 106*     Review of Systems:   Unable to obtain due to altered mental status/intubation & sedation/critical illness  Past Medical History:  She,  has a past medical history of Anxiety, Chronic lower back pain, COPD (chronic obstructive pulmonary disease) ([place]), Depression, Hypertension, Panic disorder with agoraphobia and severe panic attacks, and Peripheral sensory neuropathy.   Surgical History:   Past Surgical History:  Procedure Laterality Date   I & D EXTREMITY Right 02/09/2017   Procedure: IRRIGATION AND DEBRIDEMENT EXTREMITY;  Surgeon: Lovell Sheehan, MD;  Location: [facility] ORS;  Service: Orthopedics;  Laterality: Right;   none       Social  History:   reports that she has been smoking cigarettes and e-cigarettes. She has a 30.00 pack-year smoking history. She has never used smokeless tobacco. She reports current drug use. She reports that she does not drink alcohol.   Family History:  Her family history includes Cancer in her father and mother. There is no history of Breast cancer, Ovarian cancer, Colon cancer, Diabetes, or Heart disease.   Allergies Allergies  Allergen Reactions   Penicillins Rash    Has patient had a PCN reaction causing immediate rash, facial/tongue/throat swelling, SOB or lightheadedness with hypotension: Yes Has patient  had a PCN reaction causing severe rash involving mucus membranes or skin necrosis: No Has patient had a PCN reaction that required hospitalization: No Has patient had a PCN reaction occurring within the last 10 years: No If all of the above answers are "NO", then may proceed with Cephalosporin use.     Home Medications  Prior to Admission medications   Medication Sig Start Date End Date Taking? Authorizing Provider  albuterol (PROVENTIL HFA;VENTOLIN HFA) 108 (90 Base) MCG/ACT inhaler Inhale 2 puffs into the lungs every 6 (six) hours as needed for wheezing or shortness of breath. 11/16/15   Roselee Nova, MD  alprazolam Duanne Moron) 2 MG tablet Take 1 tablet (2 mg total) by mouth 3 (three) times daily as needed for anxiety. 01/12/17   Roselee Nova, MD  carisoprodol (SOMA) 350 MG tablet Take 1 tablet (350 mg total) by mouth 3 (three) times daily. 01/12/17   Roselee Nova, MD  COMBIVENT RESPIMAT 20-100 MCG/ACT AERS respimat INHALE 1 PUFF INTO THE LUNGS EVERY 6 HOURS AS NEEDED FOR WHEEZING OR SHORTNESS OF BREATH 02/15/17   Rochel Brome A, MD  fluticasone furoate-vilanterol (BREO ELLIPTA) 100-25 MCG/INH AEPB Inhale 1 puff into the lungs daily. 11/26/15   Roselee Nova, MD  ibuprofen (ADVIL,MOTRIN) 200 MG tablet Take 200 mg by mouth every 6 (six) hours as needed for mild pain.      [provider]  potassium chloride (K-DUR) 10 MEQ tablet TAKE 1 TABLET BY MOUTH ONCE A DAY 07/16/15   Roselee Nova, MD  pregabalin (LYRICA) 75 MG capsule Take 1 capsule (75 mg total) by mouth 3 (three) times daily. 01/12/17   Roselee Nova, MD  QUEtiapine (SEROQUEL) 100 MG tablet Take 1 tablet (100 mg total) by mouth at bedtime. 12/13/16   Roselee Nova, MD    Critical care time: 43 minutes   The patient is critically ill with multiple organ system failure and requires high complexity decision making for assessment and support, frequent evaluation and titration of therapies, advanced monitoring, review of radiographic studies and interpretation of complex data.   Darel Hong, AGACNP-BC [facility] Prefer epic messenger for cross cover needs If after hours, please call E-link  Attending attestation:  Patient seen with Darel Hong, NP.  Agree with the assessment and plan as noted above.  Currently no indication for bronchoscopy at this time now that ventilator changes have stabilized her respiratory status.  Overall she has very poor pulmonary status as noted by very severe emphysema on CT scan.  Have procured palliative care consultation to help establish goals of care.  Patient was discussed in multidisciplinary rounds.   Renold Don, MD [facility]

## 2020-07-10 NOTE — Progress Notes (Signed)
Subjective: Reintubated overnight. Currently sedated, on ventilator, on pressors.   Objective: Current vital signs: BP (!) 83/59   Pulse (!) 117   Temp 97.9 F (36.6 C) (Oral)   Resp (!) 30   Ht _0  (1.651 m)   Wt 59.6 kg   SpO2 100%   BMI 21.87 kg/m  Vital signs in last 24 hours: Temp:  [97.9 F (36.6 C)-100.4 F (38 C)] 97.9 F (36.6 C) (07/01 0800) Pulse Rate:  [53-127] 117 (07/01 0930) Resp:  [16-56] 30 (07/01 1000) BP: (71-125)/(53-97) 83/59 (07/01 1000) SpO2:  [77 %-100 %] 100 % (07/01 0930) FiO2 (%):  [80 %-100 %] 80 % (07/01 0800) Weight:  [59.6 kg] 59.6 kg (07/01 0500)  Intake/Output from previous day: 06/30 0701 - 07/01 0700 In: 899.1 [I.V.:59.1; NG/GT:40; IV Piggyback:800] Out: 1050 [Urine:1050] Intake/Output this shift: Total I/O In: 549.5 [I.V.:249.3; IV Piggyback:300.2] Out: 100 [Urine:100] Nutritional status:  Diet Order             Diet NPO time specified  Diet effective now                  HEENT: No nuchal rigidity Lungs: Intubated Ext: No edema  Neurologic Exam: Ment: On precedex and fentanyl gtt. No responses to any external stimuli.  CN: Suppressed brainstem reflexes on sedation. Sluggishly reactive pupils are equal in size at 3 mm >> 2 mm.  Motor/Sensory: Flaccid tone x 4 in the context of sedation. No movement to stimulation Reflexes: Suppressed/hypoactive in the context of sedation.   Lab Results: Results for orders placed or performed during the hospital encounter of 06/23/2020 (from the past 48 hour(s))  Basic metabolic panel     Status: Abnormal   Collection Time: 07/08/20  8:43 PM  Result Value Ref Range   Sodium 143 135 - 145 mmol/L   Potassium 4.0 3.5 - 5.1 mmol/L   Chloride 103 98 - 111 mmol/L   CO2 29 22 - 32 mmol/L   Glucose, Bld 155 (H) 70 - 99 mg/dL    Comment: Glucose reference range applies only to samples taken after fasting for at least 8 hours.   BUN 25 (H) 8 - 23 mg/dL   Creatinine, Ser 0.80 0.44 - 1.00  mg/dL   Calcium 9.1 8.9 - 10.3 mg/dL   GFR, Estimated >60 >60 mL/min    Comment: (NOTE) Calculated using the CKD-EPI Creatinine Equation (2021)    Anion gap 11 5 - 15    Comment: Performed at [facility], [address] [place], [place] [address]  Magnesium     Status: Abnormal   Collection Time: 07/08/20  8:43 PM  Result Value Ref Range   Magnesium 2.5 (H) 1.7 - 2.4 mg/dL    Comment: Performed at [facility], [address] [place], [place] [address]  Blood gas, arterial     Status: Abnormal (Preliminary result)   Collection Time: 07/08/20 11:19 PM  Result Value Ref Range   FIO2 PENDING    Delivery systems NASAL CANNULA    pH, Arterial 7.64 (HH) 7.350 - 7.450    Comment: CRITICAL RESULT CALLED TO, READ BACK BY AND VERIFIED WITH: MANSY,   CSM RRT  2325  07/08/20    pCO2 arterial 25 (L) 32.0 - 48.0 mmHg   pO2, Arterial 58 (L) 83.0 - 108.0 mmHg   Bicarbonate 26.9 20.0 - 28.0 mmol/L   Acid-Base Excess 7.0 (H) 0.0 - 2.0 mmol/L   O2 Saturation 94.7 %  Patient temperature 37.0    Collection site LEFT RADIAL    Sample type ARTERIAL DRAW    Allens test (pass/fail) PASS PASS    Comment: Performed at [facility], [address] [place], [place] [address]   Mechanical Rate PENDING   CBC     Status: Abnormal   Collection Time: 07/09/20  5:50 AM  Result Value Ref Range   WBC 47.4 (H) 4.0 - 10.5 K/uL   RBC 4.27 3.87 - 5.11 MIL/uL   Hemoglobin 13.0 12.0 - 15.0 g/dL   HCT 38.7 36.0 - 46.0 %   MCV 90.6 80.0 - 100.0 fL   MCH 30.4 26.0 - 34.0 pg   MCHC 33.6 30.0 - 36.0 g/dL   RDW 14.2 11.5 - 15.5 %   Platelets 425 (H) 150 - 400 K/uL   nRBC 0.0 0.0 - 0.2 %    Comment: Performed at [facility], [address] [place], [place] [address]  CBC with Differential/Platelet     Status: Abnormal   Collection Time: 07/09/20  8:50 AM  Result Value Ref Range   WBC 51.0 (HH) 4.0 - 10.5 K/uL    Comment:  RBV RUTH MICHAEL AT 1308 07/09/2020. GAA   RBC  4.13 3.87 - 5.11 MIL/uL   Hemoglobin 12.9 12.0 - 15.0 g/dL   HCT 38.1 36.0 - 46.0 %   MCV 92.3 80.0 - 100.0 fL   MCH 31.2 26.0 - 34.0 pg   MCHC 33.9 30.0 - 36.0 g/dL   RDW 45.9 (H) 11.5 - 15.5 %   Platelets 392 150 - 400 K/uL   Neutrophils Relative % 84 %   Neutro Abs 43.0 (H) 1.7 - 7.7 K/uL   Lymphocytes Relative 6 %   Lymphs Abs 2.9 0.7 - 4.0 K/uL   Monocytes Relative 7 %   Monocytes Absolute 3.6 (H) 0.1 - 1.0 K/uL   Eosinophils Relative 0 %   Eosinophils Absolute 0.0 0.0 - 0.5 K/uL   Basophils Relative 0 %   Basophils Absolute 0.0 0.0 - 0.1 K/uL   WBC Morphology MORPHOLOGY UNREMARKABLE    RBC Morphology POLYCHROMASIA PRESENT    Smear Review Reviewed    nRBC 0 0 /100 WBC    Comment: Performed at [facility], [address] [place], [place] [address]  Basic metabolic panel     Status: Abnormal   Collection Time: 07/09/20  8:50 AM  Result Value Ref Range   Sodium 147 (H) 135 - 145 mmol/L   Potassium 3.6 3.5 - 5.1 mmol/L   Chloride 107 98 - 111 mmol/L   CO2 27 22 - 32 mmol/L   Glucose, Bld 127 (H) 70 - 99 mg/dL    Comment: Glucose reference range applies only to samples taken after fasting for at least 8 hours.   BUN 41 (H) 8 - 23 mg/dL   Creatinine, Ser 1.01 (H) 0.44 - 1.00 mg/dL   Calcium 8.9 8.9 - 10.3 mg/dL   GFR, Estimated >60 >60 mL/min    Comment: (NOTE) Calculated using the CKD-EPI Creatinine Equation (2021)    Anion gap 13 5 - 15    Comment: Performed at [facility], [place], [place] [address]  Procalcitonin - Baseline     Status: None   Collection Time: 07/09/20  8:50 AM  Result Value Ref Range   Procalcitonin 0.22 ng/mL    Comment:        Interpretation: PCT (Procalcitonin) <= 0.5 ng/mL: Systemic infection (  sepsis) is not likely. Local bacterial infection is possible. (NOTE)       Sepsis PCT Algorithm           Lower Respiratory Tract                                      Infection PCT Algorithm     ----------------------------     ----------------------------         PCT < 0.25 ng/mL                PCT < 0.10 ng/mL          Strongly encourage             Strongly discourage   discontinuation of antibiotics    initiation of antibiotics    ----------------------------     -----------------------------       PCT 0.25 - 0.50 ng/mL            PCT 0.10 - 0.25 ng/mL               OR       >80% decrease in PCT            Discourage initiation of                                            antibiotics      Encourage discontinuation           of antibiotics    ----------------------------     -----------------------------         PCT >= 0.50 ng/mL              PCT 0.26 - 0.50 ng/mL               AND        <80% decrease in PCT             Encourage initiation of                                             antibiotics       Encourage continuation           of antibiotics    ----------------------------     -----------------------------        PCT >= 0.50 ng/mL                  PCT > 0.50 ng/mL               AND         increase in PCT                  Strongly encourage                                      initiation of antibiotics    Strongly encourage escalation           of antibiotics                                     -----------------------------  PCT <= 0.25 ng/mL                                                 OR                                        > 80% decrease in PCT                                      Discontinue / Do not initiate                                             antibiotics  Performed at [facility], [address] [place], [place] [address]   Pathologist smear review     Status: None   Collection Time: 07/09/20  8:50 AM  Result Value Ref Range   Path Review Blood smear is reviewed.     Comment: Morphology of RBCs, WBCs, and platelets within normal limits. Reviewed by Elmon Kirschner,  M.D. Performed at [facility], [address] [place], [place] [address]   Glucose, capillary     Status: Abnormal   Collection Time: 07/09/20  4:25 PM  Result Value Ref Range   Glucose-Capillary 119 (H) 70 - 99 mg/dL    Comment: Glucose reference range applies only to samples taken after fasting for at least 8 hours.  Magnesium     Status: Abnormal   Collection Time: 07/09/20  5:07 PM  Result Value Ref Range   Magnesium 2.7 (H) 1.7 - 2.4 mg/dL    Comment: Performed at [facility], [address] [place], [place] [address]  Phosphorus     Status: None   Collection Time: 07/09/20  5:07 PM  Result Value Ref Range   Phosphorus 4.2 2.5 - 4.6 mg/dL    Comment: Performed at [facility], [address] [place],  [address]  Glucose, capillary     Status: Abnormal   Collection Time: 07/09/20  7:27 PM  Result Value Ref Range   Glucose-Capillary 128 (H) 70 - 99 mg/dL    Comment: Glucose reference range applies only to samples taken after fasting for at least 8 hours.  Glucose, capillary     Status: Abnormal   Collection Time: 07/09/20 11:03 PM  Result Value Ref Range   Glucose-Capillary 141 (H) 70 - 99 mg/dL    Comment: Glucose reference range applies only to samples taken after fasting for at least 8 hours.  Blood gas, arterial     Status: Abnormal (Preliminary result)   Collection Time: 07/10/20  1:30 AM  Result Value Ref Range   FIO2 1.00    Delivery systems VENTILATOR    Mode PRESSURE REGULATED VOLUME CONTROL    VT 400 mL   LHR 16 resp/min   Peep/cpap 10.0 cm H20   pH, Arterial 7.35 7.350 - 7.450   pCO2 arterial 49 (H) 32.0 - 48.0 mmHg   pO2, Arterial 158 (H) 83.0 - 108.0 mmHg   Bicarbonate 27.1 20.0 - 28.0 mmol/L  Acid-Base Excess 0.8 0.0 - 2.0 mmol/L   O2 Saturation 99.3 %   Patient temperature 37.0    Collection site LEFT RADIAL    Drawn by ARTERIAL DRAW    Sample type ARTERIAL DRAW    Allens test (pass/fail) PASS PASS     Comment: Performed at [facility], [address] [place], [place] [address]   Mechanical Rate PENDING   Glucose, capillary     Status: Abnormal   Collection Time: 07/10/20  3:18 AM  Result Value Ref Range   Glucose-Capillary 150 (H) 70 - 99 mg/dL    Comment: Glucose reference range applies only to samples taken after fasting for at least 8 hours.  Basic metabolic panel     Status: Abnormal   Collection Time: 07/10/20  4:39 AM  Result Value Ref Range   Sodium 143 135 - 145 mmol/L   Potassium 4.5 3.5 - 5.1 mmol/L   Chloride 109 98 - 111 mmol/L   CO2 24 22 - 32 mmol/L   Glucose, Bld 160 (H) 70 - 99 mg/dL    Comment: Glucose reference range applies only to samples taken after fasting for at least 8 hours.   BUN 76 (H) 8 - 23 mg/dL   Creatinine, Ser 2.07 (H) 0.44 - 1.00 mg/dL   Calcium 8.0 (L) 8.9 - 10.3 mg/dL   GFR, Estimated 26 (L) >60 mL/min    Comment: (NOTE) Calculated using the CKD-EPI Creatinine Equation (2021)    Anion gap 10 5 - 15    Comment: Performed at [facility], [address] [place], [place] [address]  CBC with Differential/Platelet     Status: Abnormal   Collection Time: 07/10/20  4:39 AM  Result Value Ref Range   WBC 88.9 (HH) 4.0 - 10.5 K/uL    Comment: CRITICAL VALUE NOTED.  VALUE IS CONSISTENT WITH PREVIOUSLY REPORTED AND CALLED VALUE.   RBC 3.73 (L) 3.87 - 5.11 MIL/uL   Hemoglobin 12.1 12.0 - 15.0 g/dL   HCT 36.3 36.0 - 46.0 %   MCV 97.3 80.0 - 100.0 fL   MCH 32.4 26.0 - 34.0 pg   MCHC 33.3 30.0 - 36.0 g/dL   RDW 15.2 11.5 - 15.5 %   Platelets 411 (H) 150 - 400 K/uL   nRBC 0.0 0.0 - 0.2 %   Neutrophils Relative % 87 %   Neutro Abs 78.1 (H) 1.7 - 7.7 K/uL   Lymphocytes Relative 4 %   Lymphs Abs 3.3 0.7 - 4.0 K/uL   Monocytes Relative 5 %   Monocytes Absolute 4.4 (H) 0.1 - 1.0 K/uL   Eosinophils Relative 0 %   Eosinophils Absolute 0.0 0.0 - 0.5 K/uL   Basophils Relative 0 %   Basophils Absolute 0.1 0.0 - 0.1 K/uL   WBC  Morphology MILD LEFT SHIFT (1-5% METAS, OCC MYELO, OCC BANDS)     Comment: TOXIC GRANULATION   RBC Morphology MORPHOLOGY UNREMARKABLE    Smear Review Normal platelet morphology    Immature Granulocytes 4 %   Abs Immature Granulocytes 3.13 (H) 0.00 - 0.07 K/uL    Comment: Performed at [facility], [address] [place], [place] [address]  Magnesium     Status: Abnormal   Collection Time: 07/10/20  4:39 AM  Result Value Ref Range   Magnesium 3.0 (H) 1.7 - 2.4 mg/dL    Comment: Performed at [facility], [address] [place],  [address]  Phosphorus     Status:  Abnormal   Collection Time: 07/10/20  4:39 AM  Result Value Ref Range   Phosphorus 8.3 (H) 2.5 - 4.6 mg/dL    Comment: Performed at [facility], [place], [place] [address]  Procalcitonin     Status: None   Collection Time: 07/10/20  4:39 AM  Result Value Ref Range   Procalcitonin 1.18 ng/mL    Comment:        Interpretation: PCT > 0.5 ng/mL and <= 2 ng/mL: Systemic infection (sepsis) is possible, but other conditions are known to elevate PCT as well. (NOTE)       Sepsis PCT Algorithm           Lower Respiratory Tract                                      Infection PCT Algorithm    ----------------------------     ----------------------------         PCT < 0.25 ng/mL                PCT < 0.10 ng/mL          Strongly encourage             Strongly discourage   discontinuation of antibiotics    initiation of antibiotics    ----------------------------     -----------------------------       PCT 0.25 - 0.50 ng/mL            PCT 0.10 - 0.25 ng/mL               OR       >80% decrease in PCT            Discourage initiation of                                            antibiotics      Encourage discontinuation           of antibiotics    ----------------------------     -----------------------------         PCT >= 0.50 ng/mL              PCT 0.26 - 0.50 ng/mL                 AND       <80% decrease in PCT             Encourage initiation of                                             antibiotics       Encourage continuation           of antibiotics    ----------------------------     -----------------------------        PCT >= 0.50 ng/mL                  PCT > 0.50 ng/mL               AND         increase in PCT  Strongly encourage                                      initiation of antibiotics    Strongly encourage escalation           of antibiotics                                     -----------------------------                                           PCT <= 0.25 ng/mL                                                 OR                                        > 80% decrease in PCT                                      Discontinue / Do not initiate                                             antibiotics  Performed at [facility], [address] [place], [place] [address]   Blood gas, arterial     Status: Abnormal (Preliminary result)   Collection Time: 07/10/20  6:47 AM  Result Value Ref Range   FIO2 1.00    pH, Arterial 7.39 7.350 - 7.450   pCO2 arterial 41 32.0 - 48.0 mmHg   pO2, Arterial 67 (L) 83.0 - 108.0 mmHg   Bicarbonate 24.8 20.0 - 28.0 mmol/L   Acid-base deficit 0.2 0.0 - 2.0 mmol/L   O2 Saturation 92.8 %   Patient temperature 37.0    Collection site LEFT RADIAL    Sample type ARTERY     Comment: Performed at [facility], [address], [place], [place] [address]   Allens test (pass/fail) PENDING PASS   Mechanical Rate PENDING   Glucose, capillary     Status: Abnormal   Collection Time: 07/10/20  7:56 AM  Result Value Ref Range   Glucose-Capillary 106 (H) 70 - 99 mg/dL    Comment: Glucose reference range applies only to samples taken after fasting for at least 8 hours.    Recent Results (from the past 240 hour(s))  Resp Panel by RT-PCR (Flu A&B, Covid) Nasopharyngeal Swab     Status:  None   Collection Time: 06/15/2020  9:47 AM   Specimen: Nasopharyngeal Swab; Nasopharyngeal(NP) swabs in vial transport medium  Result Value Ref Range Status   SARS Coronavirus 2 by RT PCR NEGATIVE NEGATIVE Final    Comment: (NOTE) SARS-CoV-2 target nucleic acids are NOT DETECTED.  The SARS-CoV-2 RNA is generally detectable in upper respiratory  specimens during the acute phase of infection. The lowest concentration of SARS-CoV-2 viral copies this assay can detect is 138 copies/mL. A negative result does not preclude SARS-Cov-2 infection and should not be used as the sole basis for treatment or other patient management decisions. A negative result may occur with  improper specimen collection/handling, submission of specimen other than nasopharyngeal swab, presence of viral mutation(s) within the areas targeted by this assay, and inadequate number of viral copies(<138 copies/mL). A negative result must be combined with clinical observations, patient history, and epidemiological information. The expected result is Negative.  Fact Sheet for Patients:  EntrepreneurPulse.com.au  Fact Sheet for Healthcare Providers:  IncredibleEmployment.be  This test is no t yet approved or cleared by the [place] FDA and  has been authorized for detection and/or diagnosis of SARS-CoV-2 by FDA under an Emergency Use Authorization (EUA). This EUA will remain  in effect (meaning this test can be used) for the duration of the COVID-19 declaration under Section 564(b)(1) of the Act, 21 U.S.C.section 360bbb-3(b)(1), unless the authorization is terminated  or revoked sooner.       Influenza A by PCR NEGATIVE NEGATIVE Final   Influenza B by PCR NEGATIVE NEGATIVE Final    Comment: (NOTE) The Xpert Xpress SARS-CoV-2/FLU/RSV plus assay is intended as an aid in the diagnosis of influenza from Nasopharyngeal swab specimens and should not be used as a sole basis for  treatment. Nasal washings and aspirates are unacceptable for Xpert Xpress SARS-CoV-2/FLU/RSV testing.  Fact Sheet for Patients: EntrepreneurPulse.com.au  Fact Sheet for Healthcare Providers: IncredibleEmployment.be  This test is not yet approved or cleared by the [place] FDA and has been authorized for detection and/or diagnosis of SARS-CoV-2 by FDA under an Emergency Use Authorization (EUA). This EUA will remain in effect (meaning this test can be used) for the duration of the COVID-19 declaration under Section 564(b)(1) of the Act, 21 U.S.C. section 360bbb-3(b)(1), unless the authorization is terminated or revoked.  Performed at [facility], [address] [place], [place] [address]   Blood culture (routine x 2)     Status: None   Collection Time: 06/13/2020 10:59 AM   Specimen: BLOOD  Result Value Ref Range Status   Specimen Description BLOOD BLOOD RIGHT FOREARM  Final   Special Requests   Final    BOTTLES DRAWN AEROBIC AND ANAEROBIC Blood Culture adequate volume   Culture   Final    NO GROWTH 5 DAYS Performed at [facility], [address] [place], [place] [address]    Report Status 07/07/2020 FINAL  Final  Blood culture (routine x 2)     Status: None   Collection Time: 06/22/2020 10:59 AM   Specimen: BLOOD  Result Value Ref Range Status   Specimen Description BLOOD BLOOD RIGHT FOREARM  Final   Special Requests   Final    BOTTLES DRAWN AEROBIC AND ANAEROBIC Blood Culture results may not be optimal due to an inadequate volume of blood received in culture bottles   Culture   Final    NO GROWTH 5 DAYS Performed at [facility], [address], [place], [place] [address]    Report Status 07/07/2020 FINAL  Final  MRSA PCR Screening     Status: Abnormal   Collection Time: 06/22/2020  1:11 PM  Result Value Ref Range Status   MRSA by PCR POSITIVE (A) NEGATIVE Final    Comment:        The GeneXpert MRSA  Assay (FDA approved  for NASAL specimens only), is one component of a comprehensive MRSA colonization surveillance program. It is not intended to diagnose MRSA infection nor to guide or monitor treatment for MRSA infections. RESULT CALLED TO, READ BACK BY AND VERIFIED WITH: C.BAYNES,RN [id number] 06/12/2020 GM Performed at [facility], [address] [place], [place] [address]   Culture, Respiratory w Gram Stain     Status: None   Collection Time: 06/26/2020  1:23 PM   Specimen: Tracheal Aspirate; Respiratory  Result Value Ref Range Status   Specimen Description   Final    TRACHEAL ASPIRATE Performed at [facility], [address] [place], [place] [address]    Special Requests   Final    NONE Performed at [facility], [address] [place], [place] [address]    Gram Stain   Final    FEW WBC PRESENT,BOTH PMN AND MONONUCLEAR MODERATE GRAM POSITIVE COCCI IN PAIRS RARE GRAM POSITIVE RODS Performed at [facility], [place] [address] [place], [place] [address]    Culture   Final    RARE PSEUDOMONAS AERUGINOSA FEW METHICILLIN RESISTANT STAPHYLOCOCCUS AUREUS    Report Status 07/06/2020 FINAL  Final   Organism ID, Bacteria PSEUDOMONAS AERUGINOSA  Final   Organism ID, Bacteria METHICILLIN RESISTANT STAPHYLOCOCCUS AUREUS  Final      Susceptibility   Methicillin resistant staphylococcus aureus - MIC*    CIPROFLOXACIN <=0.5 SENSITIVE Sensitive     ERYTHROMYCIN >=8 RESISTANT Resistant     GENTAMICIN <=0.5 SENSITIVE Sensitive     OXACILLIN >=4 RESISTANT Resistant     TETRACYCLINE <=1 SENSITIVE Sensitive     VANCOMYCIN 1 SENSITIVE Sensitive     TRIMETH/SULFA <=10 SENSITIVE Sensitive     CLINDAMYCIN <=0.25 SENSITIVE Sensitive     RIFAMPIN <=0.5 SENSITIVE Sensitive     Inducible Clindamycin NEGATIVE Sensitive     * FEW METHICILLIN RESISTANT STAPHYLOCOCCUS AUREUS   Pseudomonas aeruginosa - MIC*    CEFTAZIDIME 2 SENSITIVE Sensitive     CIPROFLOXACIN  <=0.25 SENSITIVE Sensitive     GENTAMICIN <=1 SENSITIVE Sensitive     IMIPENEM 2 SENSITIVE Sensitive     PIP/TAZO <=4 SENSITIVE Sensitive     CEFEPIME 2 SENSITIVE Sensitive     * RARE PSEUDOMONAS AERUGINOSA  Urine culture     Status: None   Collection Time: 06/30/2020  2:44 PM   Specimen: Urine, Random  Result Value Ref Range Status   Specimen Description   Final    URINE, RANDOM Performed at [facility], [address], [place], [place] [address]    Special Requests   Final    NONE Performed at [facility], [address], [place], [place] [address]    Culture   Final    NO GROWTH Performed at [facility], [address] [place], [place] [address]    Report Status 07/04/2020 FINAL  Final  Body fluid culture w Gram Stain     Status: None   Collection Time: 06/15/2020  6:04 PM   Specimen: Pleura; Body Fluid  Result Value Ref Range Status   Specimen Description   Final    PLEURAL Performed at [facility], [address] [place], [place] [address]    Special Requests   Final    PLEURAL Performed at [facility], [address] [place],  [address]    Gram Stain   Final    RARE WBC PRESENT,BOTH PMN AND MONONUCLEAR NO ORGANISMS SEEN  Culture   Final    NO GROWTH 3 DAYS Performed at [facility], [place] [address] [place], [place] [address]    Report Status 07/06/2020 FINAL  Final    Lipid Panel No results for input(s): CHOL, TRIG, HDL, CHOLHDL, VLDL, LDLCALC in the last 72 hours.  Studies/Results: DG Chest 1 View  Result Date: 07/10/2020 CLINICAL DATA:  Intubated EXAM: CHEST  1 VIEW COMPARISON:  07/09/2020 FINDINGS: Endotracheal tube is 2 cm above the carina. Right central line is unchanged. NG tube enters the stomach. Heart is normal size. Left lower lobe atelectasis, progressing since prior study. Right lung clear. No effusions. No acute bony abnormality. IMPRESSION: Endotracheal tube 2 cm above the carina.  Worsening left lower lobe atelectasis. Electronically Signed   By: Rolm Baptise M.D.   On: 07/10/2020 01:02   DG Chest 1 View  Result Date: 07/08/2020 CLINICAL DATA:  Dyspnea EXAM: CHEST  1 VIEW COMPARISON:  07/08/2020, 07/06/2020, 07/05/2020 FINDINGS: Right-sided central venous catheter tip over the SVC. Airspace disease at the left lung base. Stable cardiomediastinal silhouette. No pneumothorax. IMPRESSION: Increased airspace disease at left base since radiograph earlier today, could be secondary to atelectasis or aspiration. Electronically Signed   By: Donavan Foil M.D.   On: 07/08/2020 21:00   DG Abd 1 View  Result Date: 07/09/2020 CLINICAL DATA:  NG tube placement. EXAM: ABDOMEN - 1 VIEW COMPARISON:  07/09/2020. FINDINGS: Right IJ line noted with tip over SVC. NG tube is been advanced. NG tube noted with tip over the stomach. Stool noted throughout the colon. Mild bibasilar atelectasis. IMPRESSION: NG tube is been advanced. NG tube noted with tip over the distal stomach in good anatomic location. Electronically Signed   By: Marcello Moores  Register   On: 07/09/2020 12:57   CT Angio Chest Pulmonary Embolism (PE) W or WO Contrast  Result Date: 07/09/2020 CLINICAL DATA:  Tachypnea, hypoxia, fever. EXAM: CT ANGIOGRAPHY CHEST CT ABDOMEN AND PELVIS WITH CONTRAST TECHNIQUE: Multidetector CT imaging of the chest was performed using the standard protocol during bolus administration of intravenous contrast. Multiplanar CT image reconstructions and MIPs were obtained to evaluate the vascular anatomy. Multidetector CT imaging of the abdomen and pelvis was performed using the standard protocol during bolus administration of intravenous contrast. CONTRAST:  187m OMNIPAQUE IOHEXOL 350 MG/ML SOLN COMPARISON:  CT chest dated 07/01/2020. FINDINGS: CTA CHEST FINDINGS Cardiovascular: Satisfactory opacification of the pulmonary arteries to the segmental level. No evidence of pulmonary embolism. Vascular calcifications are  seen in the aortic arch. Normal heart size. No pericardial effusion. A right internal jugular central venous catheter terminates in the superior vena cava. Mediastinum/Nodes: No enlarged mediastinal, hilar, or axillary lymph nodes. Aspirated debris is seen in the trachea and bilateral lower lobe bronchi. Thyroid gland and esophagus demonstrate no significant findings. An enteric tube terminates in the stomach. Lungs/Pleura: There is atelectatic collapse of the left lower lobe. There is mild right basilar atelectasis. Moderate to severe centrilobular and paraseptal emphysema is noted. There is no pneumothorax or pleural effusion. Musculoskeletal: Anterior wedge deformities of T7 and T8 are unchanged since 06/29/2020. Review of the MIP images confirms the above findings. CT ABDOMEN and PELVIS FINDINGS Hepatobiliary: No focal liver abnormality is seen. No gallstones, gallbladder wall thickening, or biliary dilatation. Pancreas: Unremarkable. No pancreatic ductal dilatation or surrounding inflammatory changes. Spleen: Normal in size without focal abnormality. Adrenals/Urinary Tract: Adrenal glands are unremarkable. Kidneys are normal, without renal calculi, focal lesion, or hydronephrosis. Gas in the urinary bladder may  represent recent instrumentation. Stomach/Bowel: Stomach is within normal limits. There is a large amount of stool in the rectum which may reflect impaction. There is edema located posterior to the rectum and anterior to the sacrum in the presacral space. Appendix appears normal. No evidence of bowel wall thickening, distention, or inflammatory changes. Vascular/Lymphatic: Aortic atherosclerosis. No enlarged abdominal or pelvic lymph nodes. Reproductive: The bilateral adnexa are unremarkable. The uterus is either atrophic or absent. Other: No abdominal wall hernia. Musculoskeletal: Degenerative changes are seen in the spine, most severe at L5-S1. Review of the MIP images confirms the above findings.  IMPRESSION: 1.  No evidence of pulmonary embolism. 2. Aspirated debris in the trachea and bilateral lower lobe bronchi with atelectatic collapse of the left lower lobe. 3. Large stool burden in the rectum with edema in the presacral space may reflect proctitis. Aortic Atherosclerosis (ICD10-I70.0) and Emphysema (ICD10-J43.9). Electronically Signed   By: Zerita Boers M.D.   On: 07/09/2020 17:11   MR BRAIN WO CONTRAST  Result Date: 07/08/2020 CLINICAL DATA:  Mental status change. EXAM: MRI HEAD WITHOUT CONTRAST TECHNIQUE: Multiplanar, multiecho pulse sequences of the brain and surrounding structures were obtained without intravenous contrast. COMPARISON:  Head CT 07/07/2020 FINDINGS: Some sequences are moderately to severely motion degraded despite use of faster, more motion resistant imaging protocols and attempts at repeat imaging. Brain: There is no evidence of an acute infarct, intracranial hemorrhage, mass, midline shift, or extra-axial fluid collection. The ventricles and sulci are within normal limits for age. A subcentimeter T2 hyperintensity at the posterior aspect of the right lentiform nucleus may reflect a chronic lacunar infarct or dilated perivascular space. No significant white matter disease is evident elsewhere for age within limitations of motion. Vascular: Major intracranial vascular flow voids are preserved. Skull and upper cervical spine: No gross skull lesion. Sinuses/Orbits: Grossly unremarkable orbits. Subtotal opacification of the maxillary sinuses by combination of fluid and mucosal thickening. Moderate volume fluid in the left sphenoid sinus. Mild mucosal thickening in the frontal and ethmoid sinuses. Small left mastoid effusion. Other: 1.1 cm T2 hypointense nodule along the posterior margin of the right parotid tail. IMPRESSION: 1. No acute intracranial abnormality identified on this motion degraded study. 2. Sinusitis. 3. 1.1 cm nodule along the right parotid tail, indeterminate  though may reflect a lymph node or small parotid neoplasm. Electronically Signed   By: Logan Bores M.D.   On: 07/08/2020 14:19   CT ABDOMEN PELVIS W CONTRAST  Result Date: 07/09/2020 CLINICAL DATA:  Tachypnea, hypoxia, fever. EXAM: CT ANGIOGRAPHY CHEST CT ABDOMEN AND PELVIS WITH CONTRAST TECHNIQUE: Multidetector CT imaging of the chest was performed using the standard protocol during bolus administration of intravenous contrast. Multiplanar CT image reconstructions and MIPs were obtained to evaluate the vascular anatomy. Multidetector CT imaging of the abdomen and pelvis was performed using the standard protocol during bolus administration of intravenous contrast. CONTRAST:  162m OMNIPAQUE IOHEXOL 350 MG/ML SOLN COMPARISON:  CT chest dated 06/30/2020. FINDINGS: CTA CHEST FINDINGS Cardiovascular: Satisfactory opacification of the pulmonary arteries to the segmental level. No evidence of pulmonary embolism. Vascular calcifications are seen in the aortic arch. Normal heart size. No pericardial effusion. A right internal jugular central venous catheter terminates in the superior vena cava. Mediastinum/Nodes: No enlarged mediastinal, hilar, or axillary lymph nodes. Aspirated debris is seen in the trachea and bilateral lower lobe bronchi. Thyroid gland and esophagus demonstrate no significant findings. An enteric tube terminates in the stomach. Lungs/Pleura: There is atelectatic collapse of the left lower  lobe. There is mild right basilar atelectasis. Moderate to severe centrilobular and paraseptal emphysema is noted. There is no pneumothorax or pleural effusion. Musculoskeletal: Anterior wedge deformities of T7 and T8 are unchanged since 06/30/2020. Review of the MIP images confirms the above findings. CT ABDOMEN and PELVIS FINDINGS Hepatobiliary: No focal liver abnormality is seen. No gallstones, gallbladder wall thickening, or biliary dilatation. Pancreas: Unremarkable. No pancreatic ductal dilatation or  surrounding inflammatory changes. Spleen: Normal in size without focal abnormality. Adrenals/Urinary Tract: Adrenal glands are unremarkable. Kidneys are normal, without renal calculi, focal lesion, or hydronephrosis. Gas in the urinary bladder may represent recent instrumentation. Stomach/Bowel: Stomach is within normal limits. There is a large amount of stool in the rectum which may reflect impaction. There is edema located posterior to the rectum and anterior to the sacrum in the presacral space. Appendix appears normal. No evidence of bowel wall thickening, distention, or inflammatory changes. Vascular/Lymphatic: Aortic atherosclerosis. No enlarged abdominal or pelvic lymph nodes. Reproductive: The bilateral adnexa are unremarkable. The uterus is either atrophic or absent. Other: No abdominal wall hernia. Musculoskeletal: Degenerative changes are seen in the spine, most severe at L5-S1. Review of the MIP images confirms the above findings. IMPRESSION: 1.  No evidence of pulmonary embolism. 2. Aspirated debris in the trachea and bilateral lower lobe bronchi with atelectatic collapse of the left lower lobe. 3. Large stool burden in the rectum with edema in the presacral space may reflect proctitis. Aortic Atherosclerosis (ICD10-I70.0) and Emphysema (ICD10-J43.9). Electronically Signed   By: Zerita Boers M.D.   On: 07/09/2020 17:11   DG Chest Port 1 View  Result Date: 07/09/2020 CLINICAL DATA:  Acute respiratory failure EXAM: PORTABLE CHEST 1 VIEW COMPARISON:  07/08/2020 FINDINGS: There is left lower lobe collapse with are lucency of the left hemithorax secondary to hyperinflation of the left upper lobe. Resultant left-sided volume loss. Coarsening of the interstitium within the lung apices is in keeping with moderate emphysema better appreciated on prior CT examination of 07/07/2020. No pneumothorax or pleural effusion. Cardiac size within normal limits. Right internal jugular central venous catheter noted  within the superior vena cava. Degenerative changes are seen within the left shoulder. IMPRESSION: Interval left lower lobe collapse. Moderate emphysema. Electronically Signed   By: Fidela Salisbury MD   On: 07/09/2020 04:51    Medications: Scheduled:  ALPRAZolam  1 mg Per Tube TID   budesonide (PULMICORT) nebulizer solution  0.5 mg Nebulization BID   chlorhexidine gluconate (MEDLINE KIT)  15 mL Mouth Rinse BID   Chlorhexidine Gluconate Cloth  6 each Topical Daily   feeding supplement (PROSource TF)  45 mL Per Tube BID   free water  200 mL Per Tube Q4H   heparin  5,000 Units Subcutaneous Q8H   hydrALAZINE  10 mg Intravenous TID   ipratropium-albuterol  3 mL Nebulization Q4H   mouth rinse  15 mL Mouth Rinse 10 times per day   multivitamin  15 mL Oral Daily   pantoprazole (PROTONIX) IV  40 mg Intravenous QHS   polyethylene glycol  17 g Oral Daily   senna  2 tablet Oral QHS   [START ON 07/13/2020] thiamine injection  100 mg Intravenous Q24H   vancomycin  500 mg Per Tube Q6H   vecuronium       Continuous:  cefTAZidime (FORTAZ)  IV     dexmedetomidine (PRECEDEX) IV infusion 0.4 mcg/kg/hr (07/10/20 0913)   feeding supplement (OSMOLITE 1.5 CAL) 20 mL/hr at 07/09/20 1827   fentaNYL infusion INTRAVENOUS  75 mcg/hr (07/10/20 0800)   midazolam 10 mg/hr (07/10/20 0955)   norepinephrine (LEVOPHED) Adult infusion 24 mcg/min (07/10/20 0958)   thiamine injection     vasopressin 0.03 Units/min (07/10/20 1046)   Assessment: 65 year old female who presented with acute respiratory failure and AMS in the setting of possible benzodiazepine and opiate overuse at home. Now with persistent AMS of fluctuating severity. 1. Exam is consistent with her sedated state (on fentanyl, Versed and Precedex gtts).  2. MRI brain: No acute abnormality 3. EEG has been ordered and is pending 4. Overall clinical picture is most consistent with a multifactorial delirium (hospital delirium, metabolic encephalopathy, septic  encephalopathy) versus unusual case of prolonged benzo or EtOH withdrawal versus catatonia (history of depression), versus cefepime neurotoxicity. Low suspicion for seizures given no jerking or twitching seen on exams by myself or other providers. No neck stiffness to suggest a meningitis.  5. Severe leukocytosis   Recommendations: 1. Continue Precedex for empiric treatment of possible benzodiazepine or EtOH withdrawal. Would taper this off last, after fentanyl and Versed have been stopped.  2. EEG pending. 3. Cefepime has been switched to ceftazidime.  4. Continued supportive care and management of pulmonary disease per CCM.   35 minutes spent in the neurological evaluation and management of this critically ill patient.      LOS: 8 days   _0  signed: Dr. Kerney Elbe 07/10/2020  10:45 AM

## 2020-07-10 NOTE — Progress Notes (Signed)
RT notified by Darlyn Chamber NP to advance tube 2cm. Tube found to be at 21cm at the lip, tube was advanced and re-secured at 23cm at the lips.

## 2020-07-10 NOTE — Procedures (Signed)
Intubation Procedure Note  JENAE TOMASELLO  [id number]  01/12/1955  Date:07/10/20  Time:12:30 AM   Provider Performing:Charissa Knowles A Quintell Bonnin    Procedure: Intubation (31500)  Indication(s) Respiratory Failure  Consent Risks of the procedure as well as the alternatives and risks of each were explained to the patient and/or caregiver.  Consent for the procedure was obtained and is signed in the bedside chart   Anesthesia Etomidate, Fentanyl, and Rocuronium   Time Out Verified patient identification, verified procedure, site/side was marked, verified correct patient position, special equipment/implants available, medications/allergies/relevant history reviewed, required imaging and test results available.   Sterile Technique Usual hand hygeine, masks, and gloves were used   Procedure Description Patient positioned in bed supine.  Sedation given as noted above.  Patient was intubated with endotracheal tube using Glidescope.  View was Grade 1 full glottis .  Number of attempts was 1.  Colorimetric CO2 detector was consistent with tracheal placement.   Complications/Tolerance None; patient tolerated the procedure well. Chest X-ray is ordered to verify placement.   EBL Minimal   Specimen(s) None   Rufina Falco, DNP, CCRN, FNP-C, AGACNP-BC Acute Care Nurse Practitioner  [facility] Pager: [phone] [place] at [facility]

## 2020-07-10 NOTE — Consult Note (Signed)
Palliative Care Consult Note                                  Date: 07/10/2020   Patient Name: Tamara Mcclure  DOB: [birth year]  MRN: [id number]  Age / Sex: 65 y.o., female  PCP: Roselee Nova, MD Referring Physician: Tyler Pita, MD  Reason for Consultation: Establishing goals of care  HPI/Patient Profile: Palliative Care consult requested for goals of care discussion in this 65 y.o. female  with past medical history of hypertension, depression, panic attacks, chronic lower back pain, peripheral neuropathy, and COPD.  She was admitted on 06/19/2020 after presenting to the ED via EMS from home with concerns for respiratory failure.  Per notations patient had a fall several days prior to admission and seen at a local urgent care.  She has been taking increased doses of opioids.  During ED work-up she failed Narcan and BiPAP requiring emergent intubation for airway protection.  Prior right chest tube.  This was discontinued on 6/28.  Patient was successfully extubated on 6/27 and reintubated on 7/1.  Past Medical History:  Diagnosis Date   Anxiety    Chronic lower back pain    COPD (chronic obstructive pulmonary disease) (HCC)    Depression    Hypertension    Panic disorder with agoraphobia and severe panic attacks    Peripheral sensory neuropathy      Subjective:   This NP Osborne Oman reviewed medical records, received report from team, assessed the patient and then met at the patient's bedside her son, [place] to discuss diagnosis, prognosis, GOC, EOL wishes disposition and options.  Ms. Mishkin remains intubated and obtunded.    Concept of Palliative Care was introduced as specialized medical care for people and their families living with serious illness.  It focuses on providing relief from the symptoms and stress of a serious illness.  The goal is to improve quality of life for both the patient and the family. Values and  goals of care important to patient and family were attempted to be elicited.  Created space and opportunity for her son to explore thoughts and feelings.   We discussed Her current illness and what it means in the larger context of Her on-going co-morbidities. Natural disease trajectory and expectations were discussed. Questions and concerns addressed.   I discussed the importance of continued conversation with family and their medical providers regarding overall plan of care and treatment options, ensuring decisions are within the context of the patients values and GOCs.  Questions and concerns were addressed.  The family was encouraged to call with questions or concerns.  PMT will continue to support holistically as needed.  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  Has not worked in over 18 years due to disability.  She is divorced and has 1 son.  Patient did not have any siblings and son states he is her only family.  Patient Values: [place] states his mother was a private person and stayed in the house most of the time. Son states she is not a spiritual person but is of [place] faith.   Patient/Family Understanding of Illness: Son verbalizes his understanding of patient's current illness.  He expressed awareness of her critical illness and continued respiratory distress.  He is remaining hopeful patient will show some improvement and be able to wean off of the ventilator  again.  Objective:   Primary Diagnoses: Present on Admission:  Acute respiratory failure (HCC)  Pleural effusion on right  Community acquired pneumonia   Scheduled Meds:  ALPRAZolam  1 mg Per Tube TID   budesonide (PULMICORT) nebulizer solution  0.5 mg Nebulization BID   chlorhexidine gluconate (MEDLINE KIT)  15 mL Mouth Rinse BID   Chlorhexidine Gluconate Cloth  6 each Topical Daily   feeding supplement (PROSource TF)  45 mL Per Tube BID   free water  150 mL Per Tube Q4H    heparin  5,000 Units Subcutaneous Q8H   hydrALAZINE  10 mg Intravenous TID   ipratropium-albuterol  3 mL Nebulization Q4H   mouth rinse  15 mL Mouth Rinse 10 times per day   midodrine  10 mg Per Tube TID WC   multivitamin  15 mL Oral Daily   pantoprazole (PROTONIX) IV  40 mg Intravenous QHS   polyethylene glycol  17 g Oral Daily   senna  2 tablet Oral QHS   [START ON 07/13/2020] thiamine injection  100 mg Intravenous Q24H   vancomycin  500 mg Per Tube Q6H   vancomycin variable dose per unstable renal function (pharmacist dosing)   Does not apply See admin instructions   vecuronium        Continuous Infusions:  cefTAZidime (FORTAZ)  IV     dexmedetomidine (PRECEDEX) IV infusion 0.4 mcg/kg/hr (07/10/20 1500)   feeding supplement (OSMOLITE 1.5 CAL) 20 mL/hr at 07/09/20 1827   fentaNYL infusion INTRAVENOUS 100 mcg/hr (07/10/20 1500)   metronidazole     midazolam 10 mg/hr (07/10/20 1545)   norepinephrine (LEVOPHED) Adult infusion 34 mcg/min (07/10/20 1500)   thiamine injection Stopped (07/10/20 1419)   vancomycin     vasopressin 0.03 Units/min (07/10/20 1500)    PRN Meds: acetaminophen, acetaminophen, chlorpheniramine-HYDROcodone, docusate, fentaNYL (SUBLIMAZE) injection, fentaNYL (SUBLIMAZE) injection, guaiFENesin, ipratropium-albuterol, labetalol, LORazepam, morphine injection, polyethylene glycol, sodium phosphate  Allergies  Allergen Reactions   Penicillins Rash    Has patient had a PCN reaction causing immediate rash, facial/tongue/throat swelling, SOB or lightheadedness with hypotension: Yes Has patient had a PCN reaction causing severe rash involving mucus membranes or skin necrosis: No Has patient had a PCN reaction that required hospitalization: No Has patient had a PCN reaction occurring within the last 10 years: No If all of the above answers are "NO", then may proceed with Cephalosporin use.    Review of Systems  Unable to perform ROS: Intubated    Physical  Exam General: critically-ill appearing Cardiovascular: Tachycardic Pulmonary: coarse, full vent support  Abdomen: soft, nontender, + bowel sounds Extremities: no edema, no joint deformities Skin: no rashes, warm and dry Neurological: sedated  Vital Signs:  BP 106/70 (BP Location: Left Arm)   Pulse (!) 114   Temp 99 F (37.2 C) (Oral)   Resp (!) 31   Ht 5' 5"  (1.651 m)   Wt 59.6 kg   SpO2 100%   BMI 21.87 kg/m  Pain Scale: CPOT   Pain Score: 0-No pain  SpO2: SpO2: 100 % O2 Device:SpO2: 100 % O2 Flow Rate: .O2 Flow Rate (L/min): 6 L/min  IO: Intake/output summary:  Intake/Output Summary (Last 24 hours) at 07/10/2020 1652 Last data filed at 07/10/2020 1500 Gross per 24 hour  Intake 2914.54 ml  Output 1420 ml  Net 1494.54 ml    LBM: Last BM Date: 07/10/20 Baseline Weight: Weight: 78 kg Most recent weight: Weight: 59.6 kg      Palliative Assessment/Data: Intubated  Advanced Care Planning:   Primary Decision Maker: NEXT OF KIN-Son ([place])  Code Status/Advance Care Planning: Full code  A discussion was had today regarding advanced directives. Concepts specific to code status, artifical feeding and hydration, continued IV antibiotics and rehospitalization was had.  The difference between a aggressive medical intervention path and a palliative comfort care path was discussed.   Patient does not have an advanced directive. Son is the only and closest living relative to patient. We discussed at length her current full code status with consideration of her current illness and comorbidities.  Son verbalizes understanding and is tearful expressing he feels as though he would be given up if he did not at least try in the event of a cardiopulmonary event.  He states patient would not want to live in a long-term care facility and if she is unable to wean and show any signs of meaningful recovery he would not wish to proceed with tracheostomy.  He would then consider focusing  all care on her comfort with a natural death.  [place] clear and expressed wishes to continue to treat the treatable aggressively.  He wishes for his mother to remain a full code.  He is remaining hopeful for some improvement in her ability to wean from the ventilator.  He is tearful expressing he is alone and has no family support.  Decisions/Changes to ACP: Continue with full scope care  Assessment & Plan:    SUMMARY OF RECOMMENDATIONS   Full Code-as requested by son Continue with full scope/aggressive interventions Son is remaining hopeful for patient's improvement and ability to wean from ventilator. If patient further declines or shows no meaningful recovery he would then consider comfort and natural dying process. He is not interested in pursuing trach or peg if she gets to that state.  PMT will continue to support and follow as needed. Please call team line with urgent needs. NO WEEKEND COVERAGE  Symptom Management:  Per Attending  Palliative Prophylaxis:  Aspiration, Bowel Regimen, Eye Care, Frequent Pain Assessment, and Oral Care  Additional Recommendations (Limitations, Scope, Preferences): Full Scope Treatment  Psycho-social/Spiritual:  Desire for further Chaplaincy support: no Additional Recommendations: Caregiving  Support/Resources  Prognosis:  Critical-Guarded   Discharge Planning:  To Be Determined   Discussed with: Dr. Patsey Berthold and Tommye Standard, RN.  Son expressed understanding and was in agreement with this plan.   Time In: 1300 Time Out: 1355 Time Total: 55 min  Visit consisted of counseling and education dealing with the complex and emotionally intense issues of symptom management and palliative care in the setting of serious and potentially life-threatening illness.Greater than 50%  of this time was spent counseling and coordinating care related to the above assessment and plan.  Signed by:  Alda Lea, AGPCNP-BC Palliative Medicine Team   Phone: [phone] Pager: [phone] Amion: Bjorn Pippin   Thank you for allowing the Palliative Medicine Team to assist in the care of this patient. Please utilize secure chat with additional questions, if there is no response within 30 minutes please call the above phone number. Palliative Medicine Team providers are available by phone from 7am to 5pm daily and can be reached through the team cell phone.  Should this patient require assistance outside of these hours, please call the patient's attending physician.

## 2020-07-10 NOTE — Consult Note (Signed)
[facility] Consult   Reason for Consult: Follow-up consult for patient with altered mental status after hospitalization and persistent mental status changes as noted previously Referring Physician: Patsey Berthold Patient Identification: Tamara Mcclure MRN:  [id number] Principal Diagnosis: Subacute delirium Diagnosis:  Principal Problem:   Subacute delirium Active Problems:   Severe sepsis with septic shock ([place])   Acute respiratory failure (HCC)   Pleural effusion on right   Community acquired pneumonia   Total Time spent with patient: 30 minutes  Subjective:   Tamara Mcclure is a 65 y.o. female patient admitted with patient not able to give information.  HPI: See notes yesterday.  This is a patient who presented to the hospital with altered mental status required intubation developing pneumonia was then extubated but then continued to have odd mental status with eyes open but being noncommunicative.  Differential diagnoses as of yesterday to me seem to include catatonia, delirium from persistent overdose effects possible status epilepticus.  Last time I saw her I had suggested EEG and possibly trial of benzodiazepines and had consulted with neurology.  Last night patient apparently decompensated with low saturations that could not be improved with BiPAP and had to be intubated.  Currently intubated and sedated and unable to interview or assess  Past Psychiatric History: Past history of anxiety and high-dose benzodiazepine use  Risk to Self:   Risk to Others:   Prior Inpatient Therapy:   Prior Outpatient Therapy:    Past Medical History:  Past Medical History:  Diagnosis Date   Anxiety    Chronic lower back pain    COPD (chronic obstructive pulmonary disease) (HCC)    Depression    Hypertension    Panic disorder with agoraphobia and severe panic attacks    Peripheral sensory neuropathy     Past Surgical History:  Procedure Laterality Date   I & D EXTREMITY Right  02/09/2017   Procedure: IRRIGATION AND DEBRIDEMENT EXTREMITY;  Surgeon: Lovell Sheehan, MD;  Location: [facility] ORS;  Service: Orthopedics;  Laterality: Right;   none     Family History:  Family History  Problem Relation Age of Onset   Cancer Mother        Lung   Cancer Father        pancreatic   Breast cancer Neg Hx    Ovarian cancer Neg Hx    Colon cancer Neg Hx    Diabetes Neg Hx    Heart disease Neg Hx    Family Psychiatric  History: See previous Social History:  Social History   Substance and Sexual Activity  Alcohol Use No     Social History   Substance and Sexual Activity  Drug Use Yes   Comment: Pt states prescribed oxy and xanax    Social History   Socioeconomic History   Marital status: Single    Spouse name: Not on file   Number of children: Not on file   Years of education: Not on file   Highest education level: Not on file  Occupational History   Not on file  Tobacco Use   Smoking status: Every Day    Packs/day: 1.00    Years: 30.00    Pack years: 30.00    Types: Cigarettes, E-cigarettes   Smokeless tobacco: Never  Vaping Use   Vaping Use: Every day  Substance and Sexual Activity   Alcohol use: No   Drug use: Yes    Comment: Pt states prescribed oxy and xanax  Sexual activity: Not Currently  Other Topics Concern   Not on file  Social History Narrative   Not on file   Social Determinants of Health   Financial Resource Strain: Not on file  Food Insecurity: Not on file  Transportation Needs: Not on file  Physical Activity: Not on file  Stress: Not on file  Social Connections: Not on file   Additional Social History:    Allergies:   Allergies  Allergen Reactions   Penicillins Rash    Has patient had a PCN reaction causing immediate rash, facial/tongue/throat swelling, SOB or lightheadedness with hypotension: Yes Has patient had a PCN reaction causing severe rash involving mucus membranes or skin necrosis: No Has patient had a PCN  reaction that required hospitalization: No Has patient had a PCN reaction occurring within the last 10 years: No If all of the above answers are "NO", then may proceed with Cephalosporin use.    Labs:  Results for orders placed or performed during the hospital encounter of 06/25/2020 (from the past 48 hour(s))  Basic metabolic panel     Status: Abnormal   Collection Time: 07/08/20  8:43 PM  Result Value Ref Range   Sodium 143 135 - 145 mmol/L   Potassium 4.0 3.5 - 5.1 mmol/L   Chloride 103 98 - 111 mmol/L   CO2 29 22 - 32 mmol/L   Glucose, Bld 155 (H) 70 - 99 mg/dL    Comment: Glucose reference range applies only to samples taken after fasting for at least 8 hours.   BUN 25 (H) 8 - 23 mg/dL   Creatinine, Ser 0.80 0.44 - 1.00 mg/dL   Calcium 9.1 8.9 - 10.3 mg/dL   GFR, Estimated >60 >60 mL/min    Comment: (NOTE) Calculated using the CKD-EPI Creatinine Equation (2021)    Anion gap 11 5 - 15    Comment: Performed at [facility], [address] [place], [place] [address]  Magnesium     Status: Abnormal   Collection Time: 07/08/20  8:43 PM  Result Value Ref Range   Magnesium 2.5 (H) 1.7 - 2.4 mg/dL    Comment: Performed at [facility], [address] [name], [place] [address]  Blood gas, arterial     Status: Abnormal (Preliminary result)   Collection Time: 07/08/20 11:19 PM  Result Value Ref Range   FIO2 PENDING    Delivery systems NASAL CANNULA    pH, Arterial 7.64 (HH) 7.350 - 7.450    Comment: CRITICAL RESULT CALLED TO, READ BACK BY AND VERIFIED WITH: MANSY,   CSM RRT  2325  07/08/20    pCO2 arterial 25 (L) 32.0 - 48.0 mmHg   pO2, Arterial 58 (L) 83.0 - 108.0 mmHg   Bicarbonate 26.9 20.0 - 28.0 mmol/L   Acid-Base Excess 7.0 (H) 0.0 - 2.0 mmol/L   O2 Saturation 94.7 %   Patient temperature 37.0    Collection site LEFT RADIAL    Sample type ARTERIAL DRAW    Allens test (pass/fail) PASS PASS    Comment: Performed at [facility], [address] [place], [place] [address]   Mechanical Rate PENDING   CBC     Status: Abnormal   Collection Time: 07/09/20  5:50 AM  Result Value Ref Range   WBC 47.4 (H) 4.0 - 10.5 K/uL   RBC 4.27 3.87 - 5.11 MIL/uL   Hemoglobin 13.0 12.0 - 15.0 g/dL   HCT 38.7 36.0 - 46.0 %   MCV  90.6 80.0 - 100.0 fL   MCH 30.4 26.0 - 34.0 pg   MCHC 33.6 30.0 - 36.0 g/dL   RDW 14.2 11.5 - 15.5 %   Platelets 425 (H) 150 - 400 K/uL   nRBC 0.0 0.0 - 0.2 %    Comment: Performed at [facility], [address] [place], [place] [address]  CBC with Differential/Platelet     Status: Abnormal   Collection Time: 07/09/20  8:50 AM  Result Value Ref Range   WBC 51.0 (HH) 4.0 - 10.5 K/uL    Comment:  RBV RUTH MICHAEL AT 1194 07/09/2020. GAA   RBC 4.13 3.87 - 5.11 MIL/uL   Hemoglobin 12.9 12.0 - 15.0 g/dL   HCT 38.1 36.0 - 46.0 %   MCV 92.3 80.0 - 100.0 fL   MCH 31.2 26.0 - 34.0 pg   MCHC 33.9 30.0 - 36.0 g/dL   RDW 45.9 (H) 11.5 - 15.5 %   Platelets 392 150 - 400 K/uL   Neutrophils Relative % 84 %   Neutro Abs 43.0 (H) 1.7 - 7.7 K/uL   Lymphocytes Relative 6 %   Lymphs Abs 2.9 0.7 - 4.0 K/uL   Monocytes Relative 7 %   Monocytes Absolute 3.6 (H) 0.1 - 1.0 K/uL   Eosinophils Relative 0 %   Eosinophils Absolute 0.0 0.0 - 0.5 K/uL   Basophils Relative 0 %   Basophils Absolute 0.0 0.0 - 0.1 K/uL   WBC Morphology MORPHOLOGY UNREMARKABLE    RBC Morphology POLYCHROMASIA PRESENT    Smear Review Reviewed    nRBC 0 0 /100 WBC    Comment: Performed at [facility], [address] [place], [place] [address]  Basic metabolic panel     Status: Abnormal   Collection Time: 07/09/20  8:50 AM  Result Value Ref Range   Sodium 147 (H) 135 - 145 mmol/L   Potassium 3.6 3.5 - 5.1 mmol/L   Chloride 107 98 - 111 mmol/L   CO2 27 22 - 32 mmol/L   Glucose, Bld 127 (H) 70 - 99 mg/dL    Comment: Glucose reference range applies only to samples taken after fasting for at least 8 hours.   BUN 41 (H) 8 - 23 mg/dL    Creatinine, Ser 1.01 (H) 0.44 - 1.00 mg/dL   Calcium 8.9 8.9 - 10.3 mg/dL   GFR, Estimated >60 >60 mL/min    Comment: (NOTE) Calculated using the CKD-EPI Creatinine Equation (2021)    Anion gap 13 5 - 15    Comment: Performed at [facility], [address] [place], [place] [address]  Procalcitonin - Baseline     Status: None   Collection Time: 07/09/20  8:50 AM  Result Value Ref Range   Procalcitonin 0.22 ng/mL    Comment:        Interpretation: PCT (Procalcitonin) <= 0.5 ng/mL: Systemic infection (sepsis) is not likely. Local bacterial infection is possible. (NOTE)       Sepsis PCT Algorithm           Lower Respiratory Tract                                      Infection PCT Algorithm    ----------------------------     ----------------------------         PCT < 0.25 ng/mL  PCT < 0.10 ng/mL          Strongly encourage             Strongly discourage   discontinuation of antibiotics    initiation of antibiotics    ----------------------------     -----------------------------       PCT 0.25 - 0.50 ng/mL            PCT 0.10 - 0.25 ng/mL               OR       >80% decrease in PCT            Discourage initiation of                                            antibiotics      Encourage discontinuation           of antibiotics    ----------------------------     -----------------------------         PCT >= 0.50 ng/mL              PCT 0.26 - 0.50 ng/mL               AND        <80% decrease in PCT             Encourage initiation of                                             antibiotics       Encourage continuation           of antibiotics    ----------------------------     -----------------------------        PCT >= 0.50 ng/mL                  PCT > 0.50 ng/mL               AND         increase in PCT                  Strongly encourage                                      initiation of antibiotics    Strongly encourage escalation           of  antibiotics                                     -----------------------------                                           PCT <= 0.25 ng/mL                                                 OR                                        >  80% decrease in PCT                                      Discontinue / Do not initiate                                             antibiotics  Performed at [facility], [address] [place], [place] [address]   Pathologist smear review     Status: None   Collection Time: 07/09/20  8:50 AM  Result Value Ref Range   Path Review Blood smear is reviewed.     Comment: Morphology of RBCs, WBCs, and platelets within normal limits. Reviewed by Elmon Kirschner, M.D. Performed at [facility], [address] [place], [place] [address]   Glucose, capillary     Status: Abnormal   Collection Time: 07/09/20  4:25 PM  Result Value Ref Range   Glucose-Capillary 119 (H) 70 - 99 mg/dL    Comment: Glucose reference range applies only to samples taken after fasting for at least 8 hours.  Magnesium     Status: Abnormal   Collection Time: 07/09/20  5:07 PM  Result Value Ref Range   Magnesium 2.7 (H) 1.7 - 2.4 mg/dL    Comment: Performed at [facility], [address] [place], [place] [address]  Phosphorus     Status: None   Collection Time: 07/09/20  5:07 PM  Result Value Ref Range   Phosphorus 4.2 2.5 - 4.6 mg/dL    Comment: Performed at [facility], [address] [place], [place] [address]  Glucose, capillary     Status: Abnormal   Collection Time: 07/09/20  7:27 PM  Result Value Ref Range   Glucose-Capillary 128 (H) 70 - 99 mg/dL    Comment: Glucose reference range applies only to samples taken after fasting for at least 8 hours.  Glucose, capillary     Status: Abnormal   Collection Time: 07/09/20 11:03 PM  Result Value Ref Range   Glucose-Capillary 141 (H) 70 - 99 mg/dL    Comment: Glucose reference range applies  only to samples taken after fasting for at least 8 hours.  Blood gas, arterial     Status: Abnormal (Preliminary result)   Collection Time: 07/10/20  1:30 AM  Result Value Ref Range   FIO2 1.00    Delivery systems VENTILATOR    Mode PRESSURE REGULATED VOLUME CONTROL    VT 400 mL   LHR 16 resp/min   Peep/cpap 10.0 cm H20   pH, Arterial 7.35 7.350 - 7.450   pCO2 arterial 49 (H) 32.0 - 48.0 mmHg   pO2, Arterial 158 (H) 83.0 - 108.0 mmHg   Bicarbonate 27.1 20.0 - 28.0 mmol/L   Acid-Base Excess 0.8 0.0 - 2.0 mmol/L   O2 Saturation 99.3 %   Patient temperature 37.0    Collection site LEFT RADIAL    Drawn by ARTERIAL DRAW    Sample type ARTERIAL DRAW    Allens test (pass/fail) PASS PASS    Comment: Performed at [facility], [address] [place], [place] [address]   Mechanical Rate PENDING   Glucose, capillary     Status: Abnormal   Collection Time: 07/10/20  3:18 AM  Result Value Ref Range  Glucose-Capillary 150 (H) 70 - 99 mg/dL    Comment: Glucose reference range applies only to samples taken after fasting for at least 8 hours.  Basic metabolic panel     Status: Abnormal   Collection Time: 07/10/20  4:39 AM  Result Value Ref Range   Sodium 143 135 - 145 mmol/L   Potassium 4.5 3.5 - 5.1 mmol/L   Chloride 109 98 - 111 mmol/L   CO2 24 22 - 32 mmol/L   Glucose, Bld 160 (H) 70 - 99 mg/dL    Comment: Glucose reference range applies only to samples taken after fasting for at least 8 hours.   BUN 76 (H) 8 - 23 mg/dL   Creatinine, Ser 2.07 (H) 0.44 - 1.00 mg/dL   Calcium 8.0 (L) 8.9 - 10.3 mg/dL   GFR, Estimated 26 (L) >60 mL/min    Comment: (NOTE) Calculated using the CKD-EPI Creatinine Equation (2021)    Anion gap 10 5 - 15    Comment: Performed at [facility], [address] [place], [place] [address]  CBC with Differential/Platelet     Status: Abnormal   Collection Time: 07/10/20  4:39 AM  Result Value Ref Range   WBC 88.9 (HH) 4.0 - 10.5 K/uL     Comment: CRITICAL VALUE NOTED.  VALUE IS CONSISTENT WITH PREVIOUSLY REPORTED AND CALLED VALUE.   RBC 3.73 (L) 3.87 - 5.11 MIL/uL   Hemoglobin 12.1 12.0 - 15.0 g/dL   HCT 36.3 36.0 - 46.0 %   MCV 97.3 80.0 - 100.0 fL   MCH 32.4 26.0 - 34.0 pg   MCHC 33.3 30.0 - 36.0 g/dL   RDW 15.2 11.5 - 15.5 %   Platelets 411 (H) 150 - 400 K/uL   nRBC 0.0 0.0 - 0.2 %   Neutrophils Relative % 87 %   Neutro Abs 78.1 (H) 1.7 - 7.7 K/uL   Lymphocytes Relative 4 %   Lymphs Abs 3.3 0.7 - 4.0 K/uL   Monocytes Relative 5 %   Monocytes Absolute 4.4 (H) 0.1 - 1.0 K/uL   Eosinophils Relative 0 %   Eosinophils Absolute 0.0 0.0 - 0.5 K/uL   Basophils Relative 0 %   Basophils Absolute 0.1 0.0 - 0.1 K/uL   WBC Morphology MILD LEFT SHIFT (1-5% METAS, OCC MYELO, OCC BANDS)     Comment: TOXIC GRANULATION   RBC Morphology MORPHOLOGY UNREMARKABLE    Smear Review Normal platelet morphology    Immature Granulocytes 4 %   Abs Immature Granulocytes 3.13 (H) 0.00 - 0.07 K/uL    Comment: Performed at [facility], [address] [place], [place] [address]  Magnesium     Status: Abnormal   Collection Time: 07/10/20  4:39 AM  Result Value Ref Range   Magnesium 3.0 (H) 1.7 - 2.4 mg/dL    Comment: Performed at [facility], [address], [place], [place] [address]  Phosphorus     Status: Abnormal   Collection Time: 07/10/20  4:39 AM  Result Value Ref Range   Phosphorus 8.3 (H) 2.5 - 4.6 mg/dL    Comment: Performed at [facility], [place], [place] [address]  Procalcitonin     Status: None   Collection Time: 07/10/20  4:39 AM  Result Value Ref Range   Procalcitonin 1.18 ng/mL    Comment:        Interpretation: PCT > 0.5 ng/mL and <= 2 ng/mL: Systemic infection (sepsis) is possible, but other conditions are  known to elevate PCT as well. (NOTE)       Sepsis PCT Algorithm           Lower Respiratory Tract                                      Infection PCT Algorithm     ----------------------------     ----------------------------         PCT < 0.25 ng/mL                PCT < 0.10 ng/mL          Strongly encourage             Strongly discourage   discontinuation of antibiotics    initiation of antibiotics    ----------------------------     -----------------------------       PCT 0.25 - 0.50 ng/mL            PCT 0.10 - 0.25 ng/mL               OR       >80% decrease in PCT            Discourage initiation of                                            antibiotics      Encourage discontinuation           of antibiotics    ----------------------------     -----------------------------         PCT >= 0.50 ng/mL              PCT 0.26 - 0.50 ng/mL                AND       <80% decrease in PCT             Encourage initiation of                                             antibiotics       Encourage continuation           of antibiotics    ----------------------------     -----------------------------        PCT >= 0.50 ng/mL                  PCT > 0.50 ng/mL               AND         increase in PCT                  Strongly encourage                                      initiation of antibiotics    Strongly encourage escalation           of antibiotics                                     -----------------------------  PCT <= 0.25 ng/mL                                                 OR                                        > 80% decrease in PCT                                      Discontinue / Do not initiate                                             antibiotics  Performed at [facility], [address] [place], [place] [address]   Blood gas, arterial     Status: Abnormal (Preliminary result)   Collection Time: 07/10/20  6:47 AM  Result Value Ref Range   FIO2 1.00    pH, Arterial 7.39 7.350 - 7.450   pCO2 arterial 41 32.0 - 48.0 mmHg   pO2, Arterial 67 (L) 83.0 - 108.0 mmHg    Bicarbonate 24.8 20.0 - 28.0 mmol/L   Acid-base deficit 0.2 0.0 - 2.0 mmol/L   O2 Saturation 92.8 %   Patient temperature 37.0    Collection site LEFT RADIAL    Sample type ARTERY     Comment: Performed at [facility], [address] [place],  [address]   Allens test (pass/fail) PENDING PASS   Mechanical Rate PENDING   Glucose, capillary     Status: Abnormal   Collection Time: 07/10/20  7:56 AM  Result Value Ref Range   Glucose-Capillary 106 (H) 70 - 99 mg/dL    Comment: Glucose reference range applies only to samples taken after fasting for at least 8 hours.    Current Facility-Administered Medications  Medication Dose Route Frequency Provider Last Rate Last Admin   acetaminophen (TYLENOL) suppository 325 mg  325 mg Rectal Q4H PRN Fritzi Mandes, MD   325 mg at 07/09/20 1822   acetaminophen (TYLENOL) tablet 650 mg  650 mg Oral Q6H PRN Fritzi Mandes, MD   650 mg at 07/09/20 1354   ALPRAZolam (XANAX) tablet 1 mg  1 mg Per Tube TID Tyler Pita, MD   1 mg at 07/10/20 1041   budesonide (PULMICORT) nebulizer solution 0.5 mg  0.5 mg Nebulization BID Milus Banister, NP   0.5 mg at 07/10/20 0747   cefTAZidime (FORTAZ) 2 g in sodium chloride 0.9 % 100 mL IVPB  2 g Intravenous Q24H Tyler Pita, MD       chlorhexidine gluconate (MEDLINE KIT) (PERIDEX) 0.12 % solution 15 mL  15 mL Mouth Rinse BID Darel Hong D, NP   15 mL at 07/10/20 7564   Chlorhexidine Gluconate Cloth 2 % PADS 6 each  6 each Topical Daily Mannam, Praveen, MD   6 each at 07/10/20 0917   chlorpheniramine-HYDROcodone (TUSSIONEX) 10-8 MG/5ML suspension 5 mL  5 mL Oral Q12H PRN Mansy, Jan A, MD       dexmedetomidine (PRECEDEX) 400 MCG/100ML (4 mcg/mL) infusion  0.4-1.2 mcg/kg/hr Intravenous Titrated Darel Hong D, NP 11.92 mL/hr at 07/10/20 1000 0.8 mcg/kg/hr at 07/10/20 1000   docusate (COLACE) 50 MG/5ML liquid 100 mg  100 mg Oral BID PRN Fritzi Mandes, MD       feeding supplement (OSMOLITE 1.5 CAL)  liquid 1,000 mL  1,000 mL Per Tube Continuous Flora Lipps, MD 20 mL/hr at 07/09/20 1827 Rate Change at 07/09/20 1827   feeding supplement (PROSource TF) liquid 45 mL  45 mL Per Tube BID Flora Lipps, MD   45 mL at 07/09/20 2237   fentaNYL (SUBLIMAZE) injection 50 mcg  50 mcg Intravenous Q15 min PRN Lang Snow, NP   50 mcg at 07/10/20 0520   fentaNYL (SUBLIMAZE) injection 50-200 mcg  50-200 mcg Intravenous Q30 min PRN Lang Snow, NP       fentaNYL 258mg in NS 2534m(1022mml) infusion-PREMIX  50-200 mcg/hr Intravenous Continuous OumLang SnowP 15 mL/hr at 07/10/20 1000 150 mcg/hr at 07/10/20 1000   free water 200 mL  200 mL Per Tube Q4H KasFlora LippsD   200 mL at 07/09/20 2011   guaiFENesin (ROBITUSSIN) 100 MG/5ML solution 100 mg  5 mL Per Tube Q4H PRN ChaBenita GutterPH       heparin injection 5,000 Units  5,000 Units Subcutaneous Q8H Mannam, Praveen, MD   5,000 Units at 07/10/20 0551   hydrALAZINE (APRESOLINE) injection 10 mg  10 mg Intravenous TID PatFritzi MandesD   10 mg at 07/08/20 2128   ipratropium-albuterol (DUONEB) 0.5-2.5 (3) MG/3ML nebulizer solution 3 mL  3 mL Nebulization Q6H PRN Graves, DanRaeford RazorP       ipratropium-albuterol (DUONEB) 0.5-2.5 (3) MG/3ML nebulizer solution 3 mL  3 mL Nebulization Q4H KeeDarel Hong NP   3 mL at 07/10/20 1102   labetalol (NORMODYNE) injection 10 mg  10 mg Intravenous Q2H PRN KeeDarel Hong NP   10 mg at 07/08/20 1227   LORazepam (ATIVAN) injection 1-2 mg  1-2 mg Intravenous Q4H PRN OumLang SnowP   1 mg at 07/09/20 2343   MEDLINE mouth rinse  15 mL Mouth Rinse 10 times per day KeeBradly BienenstockP       midazolam (VERSED) 50 mg/50 mL (1 mg/mL) premix infusion  0.5-10 mg/hr Intravenous Continuous OumLang SnowP 10 mL/hr at 07/10/20 1000 10 mg/hr at 07/10/20 1000   midodrine (PROAMATINE) tablet 10 mg  10 mg Per Tube TID WC KeeDarel Hong NP       morphine 2 MG/ML injection  1-2 mg  1-2 mg Intravenous Q4H PRN KeeDarel Hong NP   2 mg at 07/09/20 2327   multivitamin liquid 15 mL  15 mL Oral Daily PatFritzi MandesD   15 mL at 07/10/20 0917   norepinephrine (LEVOPHED) 16 mg in 250m76memix infusion  0-40 mcg/min Intravenous Titrated OumaLang Snow 22.5 mL/hr at 07/10/20 1000 24 mcg/min at 07/10/20 1000   pantoprazole (PROTONIX) injection 40 mg  40 mg Intravenous QHS Mannam, Praveen, MD   40 mg at 07/09/20 2141   polyethylene glycol (MIRALAX / GLYCOLAX) packet 17 g  17 g Oral Daily GrubDallie PilesH   17 g at 07/10/20 0919   polyethylene glycol (MIRALAX / GLYCOLAX) packet 17 g  17 g Oral Daily PRN PateFritzi Mandes       senna (SENOKOT) tablet 17.2 mg  2 tablet Oral QHS GrubVallery SaRPH   17.2 mg  at 07/09/20 2141   sodium phosphate (FLEET) 7-19 GM/118ML enema 1 enema  1 enema Rectal Once PRN Fritzi Mandes, MD       thiamine 532m in normal saline (564m IVPB  500 mg Intravenous Q8H GoTyler PitaMD       Followed by   [SDerrill MemoN 07/13/2020] thiamine (B-1) injection 100 mg  100 mg Intravenous Q24H GoTyler PitaMD       vancomycin (VANCOCIN) 50 mg/mL oral solution 500 mg  500 mg Per Tube Q6Norton PastelMD   500 mg at 07/10/20 [id number] vasopressin (PITRESSIN) 20 Units in sodium chloride 0.9 % 100 mL infusion-*FOR SHOCK*  0-0.03 Units/min Intravenous Continuous OuLang SnowNP 9 mL/hr at 07/10/20 1046 0.03 Units/min at 07/10/20 1046   vecuronium (NORCURON) 10 MG injection             Musculoskeletal: Strength & Muscle Tone: decreased Gait & Station: unable to stand Patient leans: N/A            Psychiatric Specialty Exam:  Presentation  General Appearance:  No data recorded Eye Contact: No data recorded Speech: No data recorded Speech Volume: No data recorded Handedness: No data recorded  Mood and Affect  Mood: No data recorded Affect: No data recorded  Thought Process  Thought Processes: No data  recorded Descriptions of Associations:No data recorded Orientation:No data recorded Thought Content:No data recorded History of Schizophrenia/Schizoaffective disorder:No data recorded Duration of Psychotic Symptoms:No data recorded Hallucinations:No data recorded Ideas of Reference:No data recorded Suicidal Thoughts:No data recorded Homicidal Thoughts:No data recorded  Sensorium  Memory: No data recorded Judgment: No data recorded Insight: No data recorded  Executive Functions  Concentration: No data recorded Attention Span: No data recorded Recall: No data recorded Fund of Knowledge: No data recorded Language: No data recorded  Psychomotor Activity  Psychomotor Activity: No data recorded  Assets  Assets: No data recorded  Sleep  Sleep: No data recorded  Physical Exam: Physical Exam Constitutional:      Appearance: Normal appearance. She is ill-appearing.  HENT:     Head: Normocephalic and atraumatic.     Mouth/Throat:     Pharynx: Oropharynx is clear.  Eyes:     Pupils: Pupils are equal, round, and reactive to light.  Cardiovascular:     Rate and Rhythm: Normal rate and regular rhythm.  Pulmonary:     Comments: Patient is now on mechanical ventilator Abdominal:     General: Abdomen is flat.     Palpations: Abdomen is soft.  Musculoskeletal:        General: Normal range of motion.  Skin:    General: Skin is warm and dry.  Neurological:     General: No focal deficit present.     Mental Status: She is alert. Mental status is at baseline.  Psychiatric:        Speech: She is noncommunicative.        Thought Content: Thought content normal.   Review of Systems  Unable to perform ROS: Intubated  Blood pressure (!) 86/60, pulse (!) 117, temperature 98.2 F (36.8 C), temperature source Oral, resp. rate (!) 32, height 5' 5"  (1.651 m), weight 59.6 kg, SpO2 100 %. Body mass index is 21.87 kg/m.  Treatment Plan Summary: Plan patient obviously has  decompensated badly and now primarily requires intensive care unit support and treatment of life-threatening medical problems.  I still think that the EEG would probably be useful if it could be  done.  There is still a chance that she could have status epilepticus either continuously or intermittently and this would be an important diagnosis.  Most likely at this point an EEG will just find diffuse slowing consistent with general encephalopathy.  Would not suggest any change to medicine right now that would interfere with the primary plan for life support.  I will suggest to psychiatric team to follow up with the patient.  Recontact psychiatric consults if needed.  Disposition:  See note above  Alethia Berthold, MD 07/10/2020 11:07 AM

## 2020-07-10 DEATH — deceased

## 2020-07-11 ENCOUNTER — Inpatient Hospital Stay: Payer: Medicare Other

## 2020-07-11 LAB — CBC WITH DIFFERENTIAL/PLATELET
Abs Immature Granulocytes: 2.35 10*3/uL — ABNORMAL HIGH (ref 0.00–0.07)
Basophils Absolute: 0.3 10*3/uL — ABNORMAL HIGH (ref 0.0–0.1)
Basophils Relative: 0 %
Eosinophils Absolute: 0.1 10*3/uL (ref 0.0–0.5)
Eosinophils Relative: 0 %
HCT: 28.3 % — ABNORMAL LOW (ref 36.0–46.0)
Hemoglobin: 9.5 g/dL — ABNORMAL LOW (ref 12.0–15.0)
Immature Granulocytes: 3 %
Lymphocytes Relative: 3 %
Lymphs Abs: 2.7 10*3/uL (ref 0.7–4.0)
MCH: 32.1 pg (ref 26.0–34.0)
MCHC: 33.6 g/dL (ref 30.0–36.0)
MCV: 95.6 fL (ref 80.0–100.0)
Monocytes Absolute: 3.7 10*3/uL — ABNORMAL HIGH (ref 0.1–1.0)
Monocytes Relative: 5 %
Neutro Abs: 72.9 10*3/uL — ABNORMAL HIGH (ref 1.7–7.7)
Neutrophils Relative %: 89 %
Platelets: 392 10*3/uL (ref 150–400)
RBC: 2.96 MIL/uL — ABNORMAL LOW (ref 3.87–5.11)
RDW: 15.5 % (ref 11.5–15.5)
WBC: 82 10*3/uL (ref 4.0–10.5)
nRBC: 0 % (ref 0.0–0.2)

## 2020-07-11 LAB — TRIGLYCERIDES: Triglycerides: 161 mg/dL — ABNORMAL HIGH (ref <150)

## 2020-07-11 LAB — PHOSPHORUS: Phosphorus: 6.8 mg/dL — ABNORMAL HIGH (ref 2.5–4.6)

## 2020-07-11 LAB — GLUCOSE, CAPILLARY
Glucose-Capillary: 112 mg/dL — ABNORMAL HIGH (ref 70–99)
Glucose-Capillary: 116 mg/dL — ABNORMAL HIGH (ref 70–99)
Glucose-Capillary: 124 mg/dL — ABNORMAL HIGH (ref 70–99)
Glucose-Capillary: 128 mg/dL — ABNORMAL HIGH (ref 70–99)
Glucose-Capillary: 144 mg/dL — ABNORMAL HIGH (ref 70–99)
Glucose-Capillary: 165 mg/dL — ABNORMAL HIGH (ref 70–99)
Glucose-Capillary: 178 mg/dL — ABNORMAL HIGH (ref 70–99)

## 2020-07-11 LAB — PROCALCITONIN: Procalcitonin: 3 ng/mL

## 2020-07-11 LAB — BASIC METABOLIC PANEL
Anion gap: 7 (ref 5–15)
BUN: 68 mg/dL — ABNORMAL HIGH (ref 8–23)
CO2: 21 mmol/L — ABNORMAL LOW (ref 22–32)
Calcium: 7.6 mg/dL — ABNORMAL LOW (ref 8.9–10.3)
Chloride: 114 mmol/L — ABNORMAL HIGH (ref 98–111)
Creatinine, Ser: 1.9 mg/dL — ABNORMAL HIGH (ref 0.44–1.00)
GFR, Estimated: 29 mL/min — ABNORMAL LOW (ref >60)
Glucose, Bld: 145 mg/dL — ABNORMAL HIGH (ref 70–99)
Potassium: 4.6 mmol/L (ref 3.5–5.1)
Sodium: 142 mmol/L (ref 135–145)

## 2020-07-11 LAB — MAGNESIUM: Magnesium: 2.6 mg/dL — ABNORMAL HIGH (ref 1.7–2.4)

## 2020-07-11 LAB — ALBUMIN: Albumin: 2.1 g/dL — ABNORMAL LOW (ref 3.5–5.0)

## 2020-07-11 MED ORDER — CHLORHEXIDINE GLUCONATE 0.12 % MT SOLN
OROMUCOSAL | Status: AC
  Administered 2020-07-11: 15 mL via OROMUCOSAL
  Filled 2020-07-11: qty 15

## 2020-07-11 MED ORDER — LACTATED RINGERS IV BOLUS
500.0000 mL | Freq: Once | INTRAVENOUS | Status: AC
  Administered 2020-07-11: 500 mL via INTRAVENOUS

## 2020-07-11 MED ORDER — ALBUMIN HUMAN 25 % IV SOLN
25.0000 g | Freq: Once | INTRAVENOUS | Status: AC
  Administered 2020-07-11: 25 g via INTRAVENOUS
  Filled 2020-07-11: qty 100

## 2020-07-11 NOTE — Plan of Care (Signed)
Neuro: sedation weans attempted, quickly becomes dyssynchronous on the ventilator when dose decreased Resp: stable on ventilator, becomes dyssynchronous occasionally requiring PRN sedation-cannot wean sedation CV: TMAX 38.2-tylenol suppository given, weaning Levo well GIGU: foley in place, tube feeds restarted, BM x 1 Skin: intact with scattered abrasions and bruising Social: friend visited shortly, son called for update; all questions and concerns addressed  Problem: Education: Goal: Knowledge of General Education information will improve Description: Including pain rating scale, medication(s)/side effects and non-pharmacologic comfort measures Outcome: Not Progressing   Problem: Health Behavior/Discharge Planning: Goal: Ability to manage health-related needs will improve Outcome: Not Progressing   Problem: Clinical Measurements: Goal: Ability to maintain clinical measurements within normal limits will improve Outcome: Not Progressing Goal: Will remain free from infection Outcome: Not Progressing Goal: Diagnostic test results will improve Outcome: Not Progressing Goal: Respiratory complications will improve Outcome: Not Progressing Goal: Cardiovascular complication will be avoided Outcome: Not Progressing   Problem: Activity: Goal: Risk for activity intolerance will decrease Outcome: Not Progressing   Problem: Nutrition: Goal: Adequate nutrition will be maintained Outcome: Not Progressing   Problem: Coping: Goal: Level of anxiety will decrease Outcome: Not Progressing   Problem: Elimination: Goal: Will not experience complications related to bowel motility Outcome: Not Progressing Goal: Will not experience complications related to urinary retention Outcome: Not Progressing   Problem: Pain Managment: Goal: General experience of comfort will improve Outcome: Not Progressing   Problem: Safety: Goal: Ability to remain free from injury will improve Outcome: Not  Progressing   Problem: Skin Integrity: Goal: Risk for impaired skin integrity will decrease Outcome: Not Progressing   Problem: Activity: Goal: Ability to tolerate increased activity will improve Outcome: Not Progressing   Problem: Respiratory: Goal: Ability to maintain a clear airway and adequate ventilation will improve Outcome: Not Progressing   Problem: Role Relationship: Goal: Method of communication will improve Outcome: Not Progressing

## 2020-07-11 NOTE — Progress Notes (Signed)
NAME:  Tamara Mcclure, MRN:  [id number], DOB:  Apr 15, 1955, LOS: 27 ADMISSION DATE:  06/20/2020, CONSULTATION DATE: 06/25/2020 REFERRING MD:  Lindell Noe MD, CHIEF COMPLAINT:   Acute respiratory failure, pneumonia  History of Present Illness:   65 year old with history of COPD, depression, hypertension, panic attacks presenting with altered mental status, acute on chronic respiratory failure.  Patient had a fall a few days ago, seen at local urgent care.  Has been taking increasing doses of opiate.  In the ED she failed Narcan and BiPAP and got intubated.  PCCM consulted for admission  Pertinent  Medical History    has a past medical history of Anxiety, Chronic lower back pain, COPD (chronic obstructive pulmonary disease) ([place]), Depression, Hypertension, Panic disorder with agoraphobia and severe panic attacks, and Peripheral sensory neuropathy.   Micro Data:  07/08/2020: SARS-CoV-2 and influenza PCR>> negative 06/23/2020: HIV>> nonreactive 06/21/2020: Strep pneumo urinary antigen>> negative 07/07/2020: Legionella urinary antigen>>negative 07/02/2021: Pleural fluid>> no growth 07/02/2021: Blood culture>> no growth 06/17/2020: MRSA PCR>> positive 06/20/2020: Tracheal aspirate>> Pseudomonas Aeruginosa & MRSA 07/02/2021: Urine>> no growth  Antimicrobials:  Cefepime 6/23>> 7/1 Ceftriaxone 6/24>> 6/25 Flagyl 6/23>> 6/27 Vancomycin 6/23>> 6/27; 7/1>> Linezolid 6/27>>7/1 PO Vancomycin 6/30>> Ceftazidime 7/1>>  Significant Hospital Events: Including procedures, antibiotic start and stop dates in addition to other pertinent events   6/23-admit, intubated, right chest tube placed, right IJ CVC placed 6/24: Weaning vent settings and Levophed (down to 8 mcg). Na+ corrected to 124 this morning (108 on admission) ~ D5 @ 75 started. Tracheal aspirate with Gram+ cocci in pairs & rare gram + rods ~ Cefepime changed to Ceftriaxone, Azithromycin d/c.  Pleural fluid consistent with EXUDATE ~ pleural fluid  culture pending 6/26 remains on vent, critically ill 6/27 Successfully extubated  6/28: Chest tube removed, remains with AMS, CT Head negative 6/29: Remains altered, concern for ? Catatonia vs. ? Osmotic Demyelination Syndrome (serum Na+ noted to have rapidly corrected during 1st 24 hrs of admission), Obtain MRI Brain,  Psych consulted 6/30: MRI Brain yesterday normal, tracking today (intermittently follows commands), obtain Neurology consult. WBC increased to 51 (23.6) ~ Peripheral Smear Normal, steroids d/c, consider Hematology consult; get CTA Chest and CT Abdomen/Pelvis 7/1: Overnight with hypoxia and respiratory distress, required intubation.  ABX changed to Ceftazidime and Vancomycin.  Hematology consulted for severe Leukocytosis.  EEG pending 7/2: Remains vent dependent, FiO2 down to 55%.  Chest x-ray improved with recruitment maneuvers  Scheduled Meds:  ALPRAZolam  1 mg Per Tube TID   budesonide (PULMICORT) nebulizer solution  0.5 mg Nebulization BID   chlorhexidine gluconate (MEDLINE KIT)  15 mL Mouth Rinse BID   Chlorhexidine Gluconate Cloth  6 each Topical Daily   feeding supplement (PROSource TF)  45 mL Per Tube BID   free water  150 mL Per Tube Q4H   heparin  5,000 Units Subcutaneous Q8H   hydrALAZINE  10 mg Intravenous TID   ipratropium-albuterol  3 mL Nebulization Q4H   mouth rinse  15 mL Mouth Rinse 10 times per day   midodrine  10 mg Per Tube TID WC   multivitamin  15 mL Oral Daily   pantoprazole (PROTONIX) IV  40 mg Intravenous QHS   senna  2 tablet Oral QHS   [START ON 07/13/2020] thiamine injection  100 mg Intravenous Q24H   vancomycin variable dose per unstable renal function (pharmacist dosing)   Does not apply See admin instructions   Continuous Infusions:  cefTAZidime (FORTAZ)  IV Stopped (07/10/20  2115)   dexmedetomidine (PRECEDEX) IV infusion 1.2 mcg/kg/hr (07/11/20 1000)   feeding supplement (OSMOLITE 1.5 CAL) 20 mL/hr at 07/09/20 1827   fentaNYL infusion  INTRAVENOUS 150 mcg/hr (07/11/20 1000)   lactated ringers 75 mL/hr at 07/11/20 1000   midazolam 9 mg/hr (07/11/20 1000)   norepinephrine (LEVOPHED) Adult infusion 22 mcg/min (07/11/20 1000)   thiamine injection Stopped (07/11/20 0605)   vasopressin 0.04 Units/min (07/11/20 1000)   PRN Meds:.acetaminophen, acetaminophen, chlorpheniramine-HYDROcodone, fentaNYL (SUBLIMAZE) injection, fentaNYL (SUBLIMAZE) injection, guaiFENesin, ipratropium-albuterol, labetalol, midazolam, sodium phosphate   Interim History / Subjective:  -Overnight some vent asynchrony of with adjusting sedation -Afebrile, requiring Levophed and Vasopressin -WBC increased to 88K today (51 yesterday) despite d/c steroids 6/30 hematology: Leukemoid reaction -ABX changed to Ceftazidime and Vancomycin 7/1 due to potential of encephalopathy with linezolid and cefepime -Renal function improving with resuscitation -Pt is critically ill with severe emphysema, poor prognosis ~ Palliative Care following  Objective   Blood pressure 123/73, pulse 80, temperature 99.68 F (37.6 C), resp. rate 17, height 5' 5" (1.651 m), weight 62.1 kg, SpO2 99 %. CVP:  [0 mmHg-18 mmHg] 0 mmHg  Vent Mode: PRVC FiO2 (%):  [55 %-65 %] 55 % Set Rate:  [16 bmp] 16 bmp Vt Set:  [400 mL] 400 mL PEEP:  [10 cmH20] 10 cmH20 Plateau Pressure:  [0 OMV67-20 cmH20] 18 cmH20   Intake/Output Summary (Last 24 hours) at 07/11/2020 1023 Last data filed at 07/11/2020 1000 Gross per 24 hour  Intake 6609.12 ml  Output 2265 ml  Net 4344.12 ml    Filed Weights   07/08/20 0500 07/10/20 0500 07/11/20 0423  Weight: 70 kg 59.6 kg 62.1 kg    Examination:  Gen:   Chronically on critically ill-appearing female, laying in bed, intubated and sedated, no acute distress HEENT: Atraumatic, normocephalic, EOMI, sclera anicteric Neck:     No masses; no thyromegaly, no JVD, ETT in place Lungs:    Coarse breath sounds throughout, no wheezing, overbreathing the vent, even CV:          Regular rate, Regular rhythm; S1 S2, no M/R/G, good peripheral circulation Abd:     + bowel sounds; soft, non-tender; no palpable masses, no distension Ext:   No edema; adequate peripheral perfusion Skin:      Warm and dry; no obvious rashes, lesions, ulcerations Neuro: Sedated, unable to follow commands,  pupils PERRLA Psych: Unable to assess due to AMS/intubation/sedation  Labs/imaging that I havepersonally reviewed  (right click and "Reselect all SmartList Selections" daily)   Labs 07/09/2020: glucose 160, BUN 76, Cr. 2.07, PCT 1.18, WBC 88.9 (with Neutrophilia and mild left shift), Platelets 411 ABG: pH 7.39/pCO2 41/pO2 67/Bicarb 24.8  CTA Chest 6/30>>1.  No evidence of pulmonary embolism. 2. Aspirated debris in the trachea and bilateral lower lobe bronchi with atelectatic collapse of the left lower lobe. CT Abdomen/Pelvis w/ contrast 6/30>> Large stool burden in the rectum with edema in the presacral space may reflect proctitis. MRI Brain 6/29>>1. No acute intracranial abnormality identified on this motion degraded study. 2. Sinusitis. 3. 1.1 cm nodule along the right parotid tail, indeterminate though may reflect a lymph node or small parotid neoplasm. CT Head 6/28>>No acute abnormality. No evidence of cerebral edema or infarction. No intracranial hemorrhage CT Chest w/o contrast 6/28>>IMPRESSION: 1. Largely resolved right pleural effusion with pleural drain in place. Mild or trace residual fluid and/or thickening in the right costophrenic angle. 2. Small volume retained secretions in the airways. No convincing pneumonia. Emphysema (ICD10-J43.9).  3. Aortic Atherosclerosis (ICD10-I70.0). CT head 6/23>>1. No acute intracranial abnormalities. 2. Chronic sinus inflammation. CT cervical spine 6/23>>No evidence for cervical spine fracture.  Advanced cervical spondylosis CT abdomen pelvis 6/23>>1. No definite CT evidence of acute traumatic injury to the chest, abdomen, or  pelvis. 2. Large right pleural effusion and associated atelectasis or consolidation. No displaced rib fracture or other evident etiology. 3. There are wedge deformities of the T7 and T8 vertebral bodies, proximally 30% anterior height loss, which are new compared to most recent imaging of the chest dated 09/29/2014 but age indeterminate. Correlate for acute point tenderness. 4. Emphysema. 5. Coronary artery disease.  Resolved Hospital Problem list   Atelectasis left base resolved:   Assessment & Plan:   Acute on chronic respiratory failure due to COPD exacerbation, right lower lobe pneumonia (Pseudomonas & MRSA), & large Right Pleural Effusion -Full vent support, continue t lung protective strategies -Wean FiO2 & PEEP as tolerated to maintain O2 sats 88 to 92% -Follow intermittent Chest X-ray & ABG as needed -Spontaneous Breathing Trials when respiratory parameters met and mental status permits -Continue VAP Bundle -Bronchodilators -Steroids d/c due to severe Leukocytosis -ABX changed to Ceftazidime and Vancomycin due to potential with encephalopathy with linezolid/cefepime -Recruitment Maneuvers, held today due to hypotension, atelectasis  resolved -CTA Chest 6/30 negative for PE, concern for aspirated debris in trachea and bilateral LL bronchi, with atelectatic collapse of LLL, and very severe COPD -Chest tube placement on 07/03/2020 ~ removed on 07/07/20 -Pleural fluid consistent with EXUDATE (Lights Criteria: Pleural LDH  979 is > 2/3 UNL of serum LDH (112) -Pleural fluid culture with no growth ~ consistent with PARAPNEUMONIC Effusion, resolved.  Hypovolemic shock, resolving Septic shock, resolved -Continuous cardiac monitoring -Maintain MAP greater than 65 -Vasopressors as needed to maintain MAP goal -Add Midodrine 10 mg TID -Volume resuscitate by CVP -Serum cortisol = 34.8 7/1 -Echocardiogram on 06/24/2020 with LVEF 60 to 86%, grade 1 diastolic dysfunction, RV systolic  function normal  Sepsis present on admission secondary to Pseudomonas & MRSA Pneumonia Severe Leukocytosis -Monitor fever curve -Trend WBCs and procalcitonin -Follow cultures as above -ABX changed to Ceftazidime, Vancomycin, along with PO Vancomycin -Peripheral smear on 6/30 with Morphology of RBCs, WBCs, and platelets within normal limits. -Steroids d/c on 6/30 -WBC increased to 88.9 K today 07/10/20 -Hematology consulted, appreciate input  Acute Kidney Injury (Pre renal azotemia) Mild Hypernatremia due to poor PO intake>>resolved -Monitor I&O's / urinary output -Follow BMP  -Ensure adequate renal perfusion -Avoid nephrotoxic agents as able -Replace electrolytes as indicated -Pharmacy following for assistance with electrolyte replacement -Continue free water flushes/feeds -Volume resuscitate according to CVP  Acute Metabolic Encephalopathy secondary to sepsis and Hyponatremia Sedation needs in setting of mechanical ventilation PMHx of Chronic opiate abuse -Maintain a RASS goal of 0 to -1 -Fentanyl, Versed, and Precedex to maintain RASS goal -Resumed home Xanax at lower dose, due to concern for withdrawal -Avoid sedating medications as able -Daily wake up assessment -CT head 07/07/2020 & MRI Brain 07/08/20 both negative for acute intracranial abnormality -Psychiatry and Neurology following, appreciate input -EEG pending -Continue thiamine supplementation -Cefepime switched to Ceftazidime, and Zyvox switched to Vancomycin   Best Practice (right click and "Reselect all SmartList Selections" daily)   Diet/type: Begin tube feeds 7/2 Pain/Anxiety/Delirium protocol yes VAP protocol (if indicated): yes DVT prophylaxis: prophylactic heparin  GI prophylaxis: PPI Glucose control:  yes, SSI Central venous access:  yes, and is still indicated Arterial line:  N/A Foley:  N/A Mobility:  bed rest  PT consulted: N/A Studies pending: EEG Culture data pending: N/A Last reviewed culture  data:today Antibiotics: Ceftazidime, Vancomycin, p.o. vancomycin DC'd 7/2 Flagyl DC'd 7/2 Antibiotic de-escalation:  N/A, on appropriate ABX Stop date: to be determined  Daily labs: requested Code Status:  full code Last date of multidisciplinary goals of care discussion [07/10/20] Disposition: remains critically ill, will stay in intensive care  Pt is critically ill, prognosis is extremely guarded, and long term prognosis is poor.  High risk for cardiac arrest and death.  Recommend DNR/DNI status.  Ongoing discussion with patient's son with regards to [place].   Labs   CBC: Recent Labs  Lab 07/07/20 0200 07/09/20 0550 07/09/20 0850 07/10/20 0439 07/11/20 0423  WBC 23.6* 47.4* 51.0* 88.9* 82.0*  NEUTROABS  --   --  43.0* 78.1* 72.9*  HGB 10.3* 13.0 12.9 12.1 9.5*  HCT 29.5* 38.7 38.1 36.3 28.3*  MCV 88.3 90.6 92.3 97.3 95.6  PLT 305 425* 392 411* 392     Basic Metabolic Panel: Recent Labs  Lab 07/08/20 0441 07/08/20 2043 07/09/20 0850 07/09/20 1707 07/10/20 0439 07/10/20 0455 07/11/20 0423  NA 138 143 147*  --  143  --  142  K 2.9* 4.0 3.6  --  4.5  --  4.6  CL 97* 103 107  --  109  --  114*  CO2 32 29 27  --  24  --  21*  GLUCOSE 157* 155* 127*  --  160*  --  145*  BUN 25* 25* 41*  --  76*  --  68*  CREATININE 0.66 0.80 1.01*  --  2.07*  --  1.90*  CALCIUM 8.6* 9.1 8.9  --  8.0*  --  7.6*  MG 2.2 2.5*  --  2.7* 3.0* 2.9* 2.6*  PHOS 3.6  --   --  4.2 8.3* 7.5* 6.8*    GFR: Estimated Creatinine Clearance: 27.3 mL/min (A) (by C-G formula based on SCr of 1.9 mg/dL (H)). Recent Labs  Lab 07/09/20 0550 07/09/20 0850 07/10/20 0439 07/10/20 1630 07/10/20 2020 07/11/20 0423  PROCALCITON  --  0.22 1.18  --   --  3.00  WBC 47.4* 51.0* 88.9*  --   --  82.0*  LATICACIDVEN  --   --   --  1.8 1.6  --      Liver Function Tests: No results for input(s): AST, ALT, ALKPHOS, BILITOT, PROT, ALBUMIN in the last 168 hours.  No results for input(s): LIPASE, AMYLASE in the  last 168 hours. No results for input(s): AMMONIA in the last 168 hours.  ABG    Component Value Date/Time   PHART 7.39 07/10/2020 0647   PCO2ART 41 07/10/2020 0647   PO2ART 67 (L) 07/10/2020 0647   HCO3 24.8 07/10/2020 0647   ACIDBASEDEF 0.2 07/10/2020 0647   O2SAT 92.8 07/10/2020 0647      Coagulation Profile: No results for input(s): INR, PROTIME in the last 168 hours.   Cardiac Enzymes: No results for input(s): CKTOTAL, CKMB, CKMBINDEX, TROPONINI in the last 168 hours.  HbA1C: Hgb A1c MFr Bld  Date/Time Value Ref Range Status  07/07/2020 01:25 PM 5.8 (H) 4.8 - 5.6 % Final    Comment:    (NOTE)         Prediabetes: 5.7 - 6.4         Diabetes: >6.4         Glycemic control for adults with diabetes: <7.0   03/11/2013 12:00 AM 5.8  4.0 - 6.0 % Final    CBG: Recent Labs  Lab 07/10/20 1623 07/10/20 2212 07/11/20 0053 07/11/20 0338 07/11/20 0755  GLUCAP 100* 115* 112* 116* 128*     Review of Systems:   Unable to obtain due to altered mental status/intubation & sedation/critical illness  Allergies Allergies  Allergen Reactions   Penicillins Rash    Has patient had a PCN reaction causing immediate rash, facial/tongue/throat swelling, SOB or lightheadedness with hypotension: Yes Has patient had a PCN reaction causing severe rash involving mucus membranes or skin necrosis: No Has patient had a PCN reaction that required hospitalization: No Has patient had a PCN reaction occurring within the last 10 years: No If all of the above answers are "NO", then may proceed with Cephalosporin use.    Critical care time: 40 minutes   The patient is critically ill with multiple organ system failure and requires high complexity decision making for assessment and support, frequent evaluation and titration of therapies, advanced monitoring, review of radiographic studies and interpretation of complex data.    Renold Don, MD [facility]   *This note was dictated  using voice recognition software/Dragon.  Despite best efforts to proofread, errors can occur which can change the meaning.  Any change was purely unintentional.

## 2020-07-11 NOTE — Consult Note (Signed)
Pharmacy Antibiotic Note  Tamara Mcclure is a 65 y.o. female admitted on 06/18/2020. Patient was intubated from 6/23 to 6/27 and then required reintubation 7/1 for airway protection after failing BiPAP and HHFNC. Patient with h/o COPD and currently being treated for pseudomonas and MRSA pneumonia. Following initial extubation, patient with very minimal interaction. Concern for possible lingering effects of multiple sedating medications PTA. Neurology and Psych involved. Cefepime and linezolid transitioned to vancomycin and ceftazidime with concern for possible neurotoxicity. She received 1500 mg IV vancomycin on 7/1 at 2047. Her renal function has improved slightly compared to yesterday  Plan:  1) continue ceftazidime 2 g IV q24h   2) vancomycin pharmacokinetic values for previously administered dose: Cpk (est): 34 mcg/mL T1/2: 25.6 h, Ke: 0.027 h-1 Check vancomycin level on 7/3 at 0500  Repeat BMP in am   Height: 5\' 5"  (165.1 cm) Weight: 62.1 kg (136 lb 14.5 oz) IBW/kg (Calculated) : 57  Temp (24hrs), Avg:98.9 F (37.2 C), Min:98.2 F (36.8 C), Max:100.04 F (37.8 C)  Recent Labs  Lab 07/07/20 0200 07/08/20 0441 07/08/20 2043 07/09/20 0550 07/09/20 0850 07/10/20 0439 07/10/20 1630 07/10/20 2020 07/11/20 0423  WBC 23.6*  --   --  47.4* 51.0* 88.9*  --   --  82.0*  CREATININE 0.50 0.66 0.80  --  1.01* 2.07*  --   --  1.90*  LATICACIDVEN  --   --   --   --   --   --  1.8 1.6  --      Estimated Creatinine Clearance: 27.3 mL/min (A) (by C-G formula based on SCr of 1.9 mg/dL (H)).    Allergies  Allergen Reactions   Penicillins Rash    Has patient had a PCN reaction causing immediate rash, facial/tongue/throat swelling, SOB or lightheadedness with hypotension: Yes Has patient had a PCN reaction causing severe rash involving mucus membranes or skin necrosis: No Has patient had a PCN reaction that required hospitalization: No Has patient had a PCN reaction occurring within the  last 10 years: No If all of the above answers are "NO", then may proceed with Cephalosporin use.    Antimicrobials this admission: Vancomycin 6/23 >> 6/27 Metronidazole 6/23 >> 6/27 Cefepime 6/23 >> 7/1 Linezolid 6/27 >> 7/1 Ceftazidime 7/1 >> Vancomycin 7/1 >>   Microbiology results: 6/23 BCx: NG 6/23 UCx: no growth 6/23 Trach aspirate: MRSA, pseudomonas 6/23 pleural fluid: no growth 6/23 MRSA PCR: positive 7/01 C diff: negative 7/01 GI panel: negative  Thank you for allowing pharmacy to be a part of this patient's care.  Vallery Sa, PharmD 07/11/2020 9:08 AM

## 2020-07-11 NOTE — Progress Notes (Signed)
Inserted esophageal temperature probe for ongoing monitoring.

## 2020-07-12 LAB — GLUCOSE, CAPILLARY
Glucose-Capillary: 165 mg/dL — ABNORMAL HIGH (ref 70–99)
Glucose-Capillary: 201 mg/dL — ABNORMAL HIGH (ref 70–99)
Glucose-Capillary: 152 mg/dL — ABNORMAL HIGH (ref 70–99)
Glucose-Capillary: 171 mg/dL — ABNORMAL HIGH (ref 70–99)
Glucose-Capillary: 140 mg/dL — ABNORMAL HIGH (ref 70–99)
Glucose-Capillary: 146 mg/dL — ABNORMAL HIGH (ref 70–99)

## 2020-07-12 LAB — PHOSPHORUS: Phosphorus: 2.2 mg/dL — ABNORMAL LOW (ref 2.5–4.6)

## 2020-07-12 LAB — HEMOGLOBIN AND HEMATOCRIT, BLOOD
HCT: 21.7 % — ABNORMAL LOW (ref 36.0–46.0)
Hemoglobin: 7 g/dL — ABNORMAL LOW (ref 12.0–15.0)

## 2020-07-12 LAB — BASIC METABOLIC PANEL
Anion gap: 3 — ABNORMAL LOW (ref 5–15)
BUN: 33 mg/dL — ABNORMAL HIGH (ref 8–23)
CO2: 24 mmol/L (ref 22–32)
Calcium: 7.9 mg/dL — ABNORMAL LOW (ref 8.9–10.3)
Chloride: 119 mmol/L — ABNORMAL HIGH (ref 98–111)
Creatinine, Ser: 0.91 mg/dL (ref 0.44–1.00)
GFR, Estimated: >60 mL/min (ref >60)
Glucose, Bld: 194 mg/dL — ABNORMAL HIGH (ref 70–99)
Potassium: 3.7 mmol/L (ref 3.5–5.1)
Sodium: 146 mmol/L — ABNORMAL HIGH (ref 135–145)

## 2020-07-12 LAB — CBC WITH DIFFERENTIAL/PLATELET
Abs Immature Granulocytes: 0.89 10*3/uL — ABNORMAL HIGH (ref 0.00–0.07)
Basophils Absolute: 0.1 10*3/uL (ref 0.0–0.1)
Basophils Relative: 0 %
Eosinophils Absolute: 0.3 10*3/uL (ref 0.0–0.5)
Eosinophils Relative: 1 %
HCT: 23.5 % — ABNORMAL LOW (ref 36.0–46.0)
Hemoglobin: 7.6 g/dL — ABNORMAL LOW (ref 12.0–15.0)
Immature Granulocytes: 2 %
Lymphocytes Relative: 3 %
Lymphs Abs: 1.4 10*3/uL (ref 0.7–4.0)
MCH: 32.1 pg (ref 26.0–34.0)
MCHC: 32.3 g/dL (ref 30.0–36.0)
MCV: 99.2 fL (ref 80.0–100.0)
Monocytes Absolute: 1.5 10*3/uL — ABNORMAL HIGH (ref 0.1–1.0)
Monocytes Relative: 4 %
Neutro Abs: 38.6 10*3/uL — ABNORMAL HIGH (ref 1.7–7.7)
Neutrophils Relative %: 90 %
Platelets: 308 10*3/uL (ref 150–400)
RBC: 2.37 MIL/uL — ABNORMAL LOW (ref 3.87–5.11)
RDW: 15.6 % — ABNORMAL HIGH (ref 11.5–15.5)
WBC: 42.8 10*3/uL — ABNORMAL HIGH (ref 4.0–10.5)
nRBC: 0 % (ref 0.0–0.2)

## 2020-07-12 LAB — VANCOMYCIN, RANDOM: Vancomycin Rm: 7

## 2020-07-12 LAB — MAGNESIUM: Magnesium: 2.1 mg/dL (ref 1.7–2.4)

## 2020-07-12 MED ORDER — FREE WATER
200.0000 mL | Status: DC
  Administered 2020-07-12 – 2020-07-20 (×46): 200 mL

## 2020-07-12 MED ORDER — SODIUM CHLORIDE 0.9 % IV SOLN
2.0000 g | Freq: Three times a day (TID) | INTRAVENOUS | Status: DC
  Administered 2020-07-12 – 2020-07-14 (×6): 2 g via INTRAVENOUS
  Filled 2020-07-12 (×9): qty 2

## 2020-07-12 MED ORDER — VANCOMYCIN HCL 1250 MG/250ML IV SOLN
1250.0000 mg | INTRAVENOUS | Status: DC
  Administered 2020-07-12: 1250 mg via INTRAVENOUS
  Filled 2020-07-12 (×3): qty 250

## 2020-07-12 NOTE — Consult Note (Addendum)
Pharmacy Antibiotic Note  Tamara Mcclure is a 65 y.o. female admitted on 06/24/2020. Patient was intubated from 6/23 to 6/27 and then required reintubation 7/1 for airway protection after failing BiPAP and HHFNC. Patient with h/o COPD and currently being treated for pseudomonas and MRSA pneumonia. Following initial extubation, patient with very minimal interaction. Concern for possible lingering effects of multiple sedating medications PTA. Neurology and Psych involved. Cefepime and linezolid transitioned to vancomycin and ceftazidime with concern for possible neurotoxicity. Her renal function has improved significantly compared to yesterday and previous vancomycin has been mostly cleared based a level drawn this morning  Plan:  1) adjust ceftazidime dose to 2 grams IV every 8 hours  2) start vancomycin 1250 mg IV every 24 hours Ke: 0.052 h-1, T1/2: 13.4 h Css (est): 37.1 / 11.6 mcg/mL Goal AUC 400-550 Expected AUC: 510.7 SCr used: 0.91 mg/dL Daily renal function while on IV vancomycin   Height: 5\' 5"  (165.1 cm) Weight: 65.8 kg (145 lb 1 oz) IBW/kg (Calculated) : 57  Temp (24hrs), Avg:99.1 F (37.3 C), Min:97.34 F (36.3 C), Max:100.76 F (38.2 C)  Recent Labs  Lab 07/08/20 2043 07/09/20 0550 07/09/20 0850 07/10/20 0439 07/10/20 1630 07/10/20 2020 07/11/20 0423 07/12/20 0500 07/12/20 0504  WBC  --  47.4* 51.0* 88.9*  --   --  82.0*  --  42.8*  CREATININE 0.80  --  1.01* 2.07*  --   --  1.90*  --  0.91  LATICACIDVEN  --   --   --   --  1.8 1.6  --   --   --   VANCORANDOM  --   --   --   --   --   --   --  7  --      Estimated Creatinine Clearance: 56.9 mL/min (by C-G formula based on SCr of 0.91 mg/dL).    Allergies  Allergen Reactions   Penicillins Rash    Has patient had a PCN reaction causing immediate rash, facial/tongue/throat swelling, SOB or lightheadedness with hypotension: Yes Has patient had a PCN reaction causing severe rash involving mucus membranes or skin  necrosis: No Has patient had a PCN reaction that required hospitalization: No Has patient had a PCN reaction occurring within the last 10 years: No If all of the above answers are "NO", then may proceed with Cephalosporin use.    Antimicrobials this admission: Vancomycin 6/23 >> 6/27 Metronidazole 6/23 >> 6/27 Cefepime 6/23 >> 7/1 Linezolid 6/27 >> 7/1 Ceftazidime 7/1 >> Vancomycin 7/1 >>   Microbiology results: 6/23 BCx: NG 6/23 UCx: no growth 6/23 Trach aspirate: MRSA, pseudomonas 6/23 pleural fluid: no growth 6/23 MRSA PCR: positive 7/01 C diff: negative 7/01 GI panel: negative  Thank you for allowing pharmacy to be a part of this patient's care.  Vallery Sa, PharmD 07/12/2020 8:09 AM

## 2020-07-12 NOTE — Progress Notes (Addendum)
Subjective: Gradually weaning off pressors and sedation.   Objective: Current vital signs: BP 99/64   Pulse 71   Temp (!) 97.16 F (36.2 C)   Resp 16   Ht 5' 5"  (1.651 m)   Wt 65.8 kg   SpO2 100%   BMI 24.14 kg/m  Vital signs in last 24 hours: Temp:  [97.16 F (36.2 C)-100.76 F (38.2 C)] 97.16 F (36.2 C) (07/03 0800) Pulse Rate:  [64-86] 71 (07/03 0800) Resp:  [13-24] 16 (07/03 0800) BP: (91-124)/(59-70) 99/64 (07/03 0800) SpO2:  [97 %-100 %] 100 % (07/03 0800) Arterial Line BP: (99-137)/(48-121) 110/55 (07/03 0800) FiO2 (%):  [55 %] 55 % (07/03 0732) Weight:  [65.8 kg] 65.8 kg (07/03 0500)  Intake/Output from previous day: 07/02 0701 - 07/03 0700 In: 3814.9 [I.V.:3267.9; NG/GT:117.5; IV Piggyback:429.4] Out: 0932 [Urine:3270] Intake/Output this shift: Total I/O In: 308.8 [I.V.:258.8; IV Piggyback:50] Out: -  Nutritional status:  Diet Order             Diet NPO time specified  Diet effective now                  HEENT: No nuchal rigidity Lungs: Intubated Ext: Warm and well perfused   Neurologic Exam: Ment: On continuous IV sedation. Precedex briefly held for exam, with no change to the patient's deeply sedated state. No responses to any external stimuli. CN: Suppressed brainstem reflexes on sedation. Pupils are equal in size and sluggishly reactive. Motor/Sensory: Flaccid tone x 4 in the context of sedation. No movement to stimulation. No tremors, posturing, jerking or other adventitious movements noted.   Lab Results: Results for orders placed or performed during the hospital encounter of 06/18/2020 (from the past 48 hour(s))  Cortisol, Random     Status: None   Collection Time: 07/10/20  9:43 AM  Result Value Ref Range   Cortisol, Plasma 34.8 ug/dL    Comment: (NOTE) AM    6.7 - 22.6 ug/dL PM   <10.0       ug/dL Performed at [facility] [address] [place], [place] [address]   Glucose, capillary     Status: Abnormal   Collection  Time: 07/10/20 11:29 AM  Result Value Ref Range   Glucose-Capillary 130 (H) 70 - 99 mg/dL    Comment: Glucose reference range applies only to samples taken after fasting for at least 8 hours.  Glucose, capillary     Status: Abnormal   Collection Time: 07/10/20  4:23 PM  Result Value Ref Range   Glucose-Capillary 100 (H) 70 - 99 mg/dL    Comment: Glucose reference range applies only to samples taken after fasting for at least 8 hours.  Lactic acid, plasma     Status: None   Collection Time: 07/10/20  4:30 PM  Result Value Ref Range   Lactic Acid, Venous 1.8 0.5 - 1.9 mmol/L    Comment: Performed at [facility], [address] [place], [place] [address]  Gastrointestinal Panel by PCR , Stool     Status: None   Collection Time: 07/10/20  5:00 PM   Specimen: Stool  Result Value Ref Range   Campylobacter species NOT DETECTED NOT DETECTED   Plesimonas shigelloides NOT DETECTED NOT DETECTED   Salmonella species NOT DETECTED NOT DETECTED   Yersinia enterocolitica NOT DETECTED NOT DETECTED   Vibrio species NOT DETECTED NOT DETECTED   Vibrio cholerae NOT DETECTED NOT DETECTED   Enteroaggregative E coli (EAEC) NOT DETECTED NOT DETECTED  Enteropathogenic E coli (EPEC) NOT DETECTED NOT DETECTED   Enterotoxigenic E coli (ETEC) NOT DETECTED NOT DETECTED   Shiga like toxin producing E coli (STEC) NOT DETECTED NOT DETECTED   Shigella/Enteroinvasive E coli (EIEC) NOT DETECTED NOT DETECTED   Cryptosporidium NOT DETECTED NOT DETECTED   Cyclospora cayetanensis NOT DETECTED NOT DETECTED   Entamoeba histolytica NOT DETECTED NOT DETECTED   Giardia lamblia NOT DETECTED NOT DETECTED   Adenovirus F40/41 NOT DETECTED NOT DETECTED   Astrovirus NOT DETECTED NOT DETECTED   Norovirus GI/GII NOT DETECTED NOT DETECTED   Rotavirus A NOT DETECTED NOT DETECTED   Sapovirus (I, II, IV, and V) NOT DETECTED NOT DETECTED    Comment: Performed at [facility], [address], [place], [place]  [address]  C Difficile Quick Screen (NO PCR Reflex)     Status: None   Collection Time: 07/10/20  5:00 PM   Specimen: STOOL  Result Value Ref Range   C Diff antigen NEGATIVE NEGATIVE   C Diff toxin NEGATIVE NEGATIVE   C Diff interpretation No C. difficile detected.     Comment: Performed at [facility], [address] [place], [place] [address]  Lactic acid, plasma     Status: None   Collection Time: 07/10/20  8:20 PM  Result Value Ref Range   Lactic Acid, Venous 1.6 0.5 - 1.9 mmol/L    Comment: Performed at [facility], [address] [place], [place] [address]  Glucose, capillary     Status: Abnormal   Collection Time: 07/10/20 10:12 PM  Result Value Ref Range   Glucose-Capillary 115 (H) 70 - 99 mg/dL    Comment: Glucose reference range applies only to samples taken after fasting for at least 8 hours.  Glucose, capillary     Status: Abnormal   Collection Time: 07/11/20 12:53 AM  Result Value Ref Range   Glucose-Capillary 112 (H) 70 - 99 mg/dL    Comment: Glucose reference range applies only to samples taken after fasting for at least 8 hours.  Glucose, capillary     Status: Abnormal   Collection Time: 07/11/20  3:38 AM  Result Value Ref Range   Glucose-Capillary 116 (H) 70 - 99 mg/dL    Comment: Glucose reference range applies only to samples taken after fasting for at least 8 hours.  Basic metabolic panel     Status: Abnormal   Collection Time: 07/11/20  4:23 AM  Result Value Ref Range   Sodium 142 135 - 145 mmol/L   Potassium 4.6 3.5 - 5.1 mmol/L    Comment: HEMOLYSIS AT THIS LEVEL MAY AFFECT RESULT   Chloride 114 (H) 98 - 111 mmol/L   CO2 21 (L) 22 - 32 mmol/L   Glucose, Bld 145 (H) 70 - 99 mg/dL    Comment: Glucose reference range applies only to samples taken after fasting for at least 8 hours.   BUN 68 (H) 8 - 23 mg/dL   Creatinine, Ser 1.90 (H) 0.44 - 1.00 mg/dL   Calcium 7.6 (L) 8.9 - 10.3 mg/dL   GFR, Estimated 29 (L) >60 mL/min    Comment:  (NOTE) Calculated using the CKD-EPI Creatinine Equation (2021)    Anion gap 7 5 - 15    Comment: Performed at [facility], [address] [place], [place] [address]  CBC with Differential/Platelet     Status: Abnormal   Collection Time: 07/11/20  4:23 AM  Result Value Ref Range   WBC 82.0 (HH) 4.0 -  10.5 K/uL    Comment: CRITICAL VALUE NOTED.  VALUE IS CONSISTENT WITH PREVIOUSLY REPORTED AND CALLED VALUE.   RBC 2.96 (L) 3.87 - 5.11 MIL/uL   Hemoglobin 9.5 (L) 12.0 - 15.0 g/dL   HCT 28.3 (L) 36.0 - 46.0 %   MCV 95.6 80.0 - 100.0 fL   MCH 32.1 26.0 - 34.0 pg   MCHC 33.6 30.0 - 36.0 g/dL   RDW 15.5 11.5 - 15.5 %   Platelets 392 150 - 400 K/uL   nRBC 0.0 0.0 - 0.2 %   Neutrophils Relative % 89 %   Neutro Abs 72.9 (H) 1.7 - 7.7 K/uL   Lymphocytes Relative 3 %   Lymphs Abs 2.7 0.7 - 4.0 K/uL   Monocytes Relative 5 %   Monocytes Absolute 3.7 (H) 0.1 - 1.0 K/uL   Eosinophils Relative 0 %   Eosinophils Absolute 0.1 0.0 - 0.5 K/uL   Basophils Relative 0 %   Basophils Absolute 0.3 (H) 0.0 - 0.1 K/uL   Immature Granulocytes 3 %   Abs Immature Granulocytes 2.35 (H) 0.00 - 0.07 K/uL    Comment: Performed at [facility], [address] [place], [place] [address]  Magnesium     Status: Abnormal   Collection Time: 07/11/20  4:23 AM  Result Value Ref Range   Magnesium 2.6 (H) 1.7 - 2.4 mg/dL    Comment: Performed at [facility], [address] [place], [place] [address]  Phosphorus     Status: Abnormal   Collection Time: 07/11/20  4:23 AM  Result Value Ref Range   Phosphorus 6.8 (H) 2.5 - 4.6 mg/dL    Comment: Performed at [facility], [address] [place], [place] [address]  Procalcitonin     Status: None   Collection Time: 07/11/20  4:23 AM  Result Value Ref Range   Procalcitonin 3.00 ng/mL    Comment:        Interpretation: PCT > 2 ng/mL: Systemic infection (sepsis) is likely, unless other causes are known. (NOTE)       Sepsis PCT  Algorithm           Lower Respiratory Tract                                      Infection PCT Algorithm    ----------------------------     ----------------------------         PCT < 0.25 ng/mL                PCT < 0.10 ng/mL          Strongly encourage             Strongly discourage   discontinuation of antibiotics    initiation of antibiotics    ----------------------------     -----------------------------       PCT 0.25 - 0.50 ng/mL            PCT 0.10 - 0.25 ng/mL               OR       >80% decrease in PCT            Discourage initiation of  antibiotics      Encourage discontinuation           of antibiotics    ----------------------------     -----------------------------         PCT >= 0.50 ng/mL              PCT 0.26 - 0.50 ng/mL               AND       <80% decrease in PCT              Encourage initiation of                                             antibiotics       Encourage continuation           of antibiotics    ----------------------------     -----------------------------        PCT >= 0.50 ng/mL                  PCT > 0.50 ng/mL               AND         increase in PCT                  Strongly encourage                                      initiation of antibiotics    Strongly encourage escalation           of antibiotics                                     -----------------------------                                           PCT <= 0.25 ng/mL                                                 OR                                        > 80% decrease in PCT                                      Discontinue / Do not initiate                                             antibiotics  Performed at [facility], [address] [place], [place] [address]   Albumin     Status: Abnormal   Collection Time: 07/11/20  4:23 AM  Result Value Ref Range   Albumin 2.1 (L) 3.5 - 5.0 g/dL    Comment: Performed at  [facility], [address] [place], [place] [address]  Triglycerides     Status: Abnormal   Collection Time: 07/11/20  4:24 AM  Result Value Ref Range   Triglycerides 161 (H) <150 mg/dL    Comment: Performed at [facility], [address] [place], [place] [address]  Glucose, capillary     Status: Abnormal   Collection Time: 07/11/20  7:55 AM  Result Value Ref Range   Glucose-Capillary 128 (H) 70 - 99 mg/dL    Comment: Glucose reference range applies only to samples taken after fasting for at least 8 hours.  Glucose, capillary     Status: Abnormal   Collection Time: 07/11/20 11:41 AM  Result Value Ref Range   Glucose-Capillary 124 (H) 70 - 99 mg/dL    Comment: Glucose reference range applies only to samples taken after fasting for at least 8 hours.  Glucose, capillary     Status: Abnormal   Collection Time: 07/11/20  4:15 PM  Result Value Ref Range   Glucose-Capillary 144 (H) 70 - 99 mg/dL    Comment: Glucose reference range applies only to samples taken after fasting for at least 8 hours.  Glucose, capillary     Status: Abnormal   Collection Time: 07/11/20  8:01 PM  Result Value Ref Range   Glucose-Capillary 165 (H) 70 - 99 mg/dL    Comment: Glucose reference range applies only to samples taken after fasting for at least 8 hours.  Glucose, capillary     Status: Abnormal   Collection Time: 07/11/20 11:38 PM  Result Value Ref Range   Glucose-Capillary 178 (H) 70 - 99 mg/dL    Comment: Glucose reference range applies only to samples taken after fasting for at least 8 hours.  Glucose, capillary     Status: Abnormal   Collection Time: 07/12/20  4:01 AM  Result Value Ref Range   Glucose-Capillary 165 (H) 70 - 99 mg/dL    Comment: Glucose reference range applies only to samples taken after fasting for at least 8 hours.  Vancomycin, random     Status: None   Collection Time: 07/12/20  5:00 AM  Result Value Ref Range   Vancomycin Rm 7     Comment:         Random Vancomycin therapeutic range is dependent on dosage and time of specimen collection. A peak range is 20.0-40.0 ug/mL A trough range is 5.0-15.0 ug/mL        Performed at [facility], [address] [place], [place] [address]   Basic metabolic panel     Status: Abnormal   Collection Time: 07/12/20  5:04 AM  Result Value Ref Range   Sodium 146 (H) 135 - 145 mmol/L   Potassium 3.7 3.5 - 5.1 mmol/L   Chloride 119 (H) 98 - 111 mmol/L   CO2 24 22 - 32 mmol/L   Glucose, Bld 194 (H) 70 - 99 mg/dL    Comment: Glucose reference range applies only to samples taken after fasting for at least 8 hours.   BUN 33 (H) 8 - 23 mg/dL   Creatinine, Ser 0.91 0.44 - 1.00 mg/dL   Calcium 7.9 (L) 8.9 - 10.3 mg/dL   GFR, Estimated >60 >60 mL/min    Comment: (NOTE) Calculated using the CKD-EPI Creatinine Equation (2021)    Anion gap 3 (L) 5 -  15    Comment: Performed at [facility], [address] [place], [place] [address]  CBC with Differential/Platelet     Status: Abnormal   Collection Time: 07/12/20  5:04 AM  Result Value Ref Range   WBC 42.8 (H) 4.0 - 10.5 K/uL   RBC 2.37 (L) 3.87 - 5.11 MIL/uL   Hemoglobin 7.6 (L) 12.0 - 15.0 g/dL   HCT 23.5 (L) 36.0 - 46.0 %   MCV 99.2 80.0 - 100.0 fL   MCH 32.1 26.0 - 34.0 pg   MCHC 32.3 30.0 - 36.0 g/dL   RDW 15.6 (H) 11.5 - 15.5 %   Platelets 308 150 - 400 K/uL   nRBC 0.0 0.0 - 0.2 %   Neutrophils Relative % 90 %   Neutro Abs 38.6 (H) 1.7 - 7.7 K/uL   Lymphocytes Relative 3 %   Lymphs Abs 1.4 0.7 - 4.0 K/uL   Monocytes Relative 4 %   Monocytes Absolute 1.5 (H) 0.1 - 1.0 K/uL   Eosinophils Relative 1 %   Eosinophils Absolute 0.3 0.0 - 0.5 K/uL   Basophils Relative 0 %   Basophils Absolute 0.1 0.0 - 0.1 K/uL   Immature Granulocytes 2 %   Abs Immature Granulocytes 0.89 (H) 0.00 - 0.07 K/uL    Comment: Performed at [facility], [address], [place], [place] [address]  Magnesium     Status: None    Collection Time: 07/12/20  5:04 AM  Result Value Ref Range   Magnesium 2.1 1.7 - 2.4 mg/dL    Comment: Performed at [facility], [address] [place], [place] [address]  Phosphorus     Status: Abnormal   Collection Time: 07/12/20  5:04 AM  Result Value Ref Range   Phosphorus 2.2 (L) 2.5 - 4.6 mg/dL    Comment: Performed at [facility], [address] [place], [place] [address]  Glucose, capillary     Status: Abnormal   Collection Time: 07/12/20  7:50 AM  Result Value Ref Range   Glucose-Capillary 201 (H) 70 - 99 mg/dL    Comment: Glucose reference range applies only to samples taken after fasting for at least 8 hours.    Recent Results (from the past 240 hour(s))  Resp Panel by RT-PCR (Flu A&B, Covid) Nasopharyngeal Swab     Status: None   Collection Time: 07/01/2020  9:47 AM   Specimen: Nasopharyngeal Swab; Nasopharyngeal(NP) swabs in vial transport medium  Result Value Ref Range Status   SARS Coronavirus 2 by RT PCR NEGATIVE NEGATIVE Final    Comment: (NOTE) SARS-CoV-2 target nucleic acids are NOT DETECTED.  The SARS-CoV-2 RNA is generally detectable in upper respiratory specimens during the acute phase of infection. The lowest concentration of SARS-CoV-2 viral copies this assay can detect is 138 copies/mL. A negative result does not preclude SARS-Cov-2 infection and should not be used as the sole basis for treatment or other patient management decisions. A negative result may occur with  improper specimen collection/handling, submission of specimen other than nasopharyngeal swab, presence of viral mutation(s) within the areas targeted by this assay, and inadequate number of viral copies(<138 copies/mL). A negative result must be combined with clinical observations, patient history, and epidemiological information. The expected result is Negative.  Fact Sheet for Patients:  EntrepreneurPulse.com.au  Fact Sheet for Healthcare  Providers:  IncredibleEmployment.be  This test is no t yet approved or cleared by the [place] FDA and  has been authorized for detection and/or  diagnosis of SARS-CoV-2 by FDA under an Emergency Use Authorization (EUA). This EUA will remain  in effect (meaning this test can be used) for the duration of the COVID-19 declaration under Section 564(b)(1) of the Act, 21 U.S.C.section 360bbb-3(b)(1), unless the authorization is terminated  or revoked sooner.       Influenza A by PCR NEGATIVE NEGATIVE Final   Influenza B by PCR NEGATIVE NEGATIVE Final    Comment: (NOTE) The Xpert Xpress SARS-CoV-2/FLU/RSV plus assay is intended as an aid in the diagnosis of influenza from Nasopharyngeal swab specimens and should not be used as a sole basis for treatment. Nasal washings and aspirates are unacceptable for Xpert Xpress SARS-CoV-2/FLU/RSV testing.  Fact Sheet for Patients: EntrepreneurPulse.com.au  Fact Sheet for Healthcare Providers: IncredibleEmployment.be  This test is not yet approved or cleared by the [place] FDA and has been authorized for detection and/or diagnosis of SARS-CoV-2 by FDA under an Emergency Use Authorization (EUA). This EUA will remain in effect (meaning this test can be used) for the duration of the COVID-19 declaration under Section 564(b)(1) of the Act, 21 U.S.C. section 360bbb-3(b)(1), unless the authorization is terminated or revoked.  Performed at [facility], [address] [place], [place] [address]   Blood culture (routine x 2)     Status: None   Collection Time: 07/01/2020 10:59 AM   Specimen: BLOOD  Result Value Ref Range Status   Specimen Description BLOOD BLOOD RIGHT FOREARM  Final   Special Requests   Final    BOTTLES DRAWN AEROBIC AND ANAEROBIC Blood Culture adequate volume   Culture   Final    NO GROWTH 5 DAYS Performed at [facility], [address]  [place], [place] [address]    Report Status 07/07/2020 FINAL  Final  Blood culture (routine x 2)     Status: None   Collection Time: 06/20/2020 10:59 AM   Specimen: BLOOD  Result Value Ref Range Status   Specimen Description BLOOD BLOOD RIGHT FOREARM  Final   Special Requests   Final    BOTTLES DRAWN AEROBIC AND ANAEROBIC Blood Culture results may not be optimal due to an inadequate volume of blood received in culture bottles   Culture   Final    NO GROWTH 5 DAYS Performed at [facility], [address] [place], [place] [address]    Report Status 07/07/2020 FINAL  Final  MRSA PCR Screening     Status: Abnormal   Collection Time: 07/04/2020  1:11 PM  Result Value Ref Range Status   MRSA by PCR POSITIVE (A) NEGATIVE Final    Comment:        The GeneXpert MRSA Assay (FDA approved for NASAL specimens only), is one component of a comprehensive MRSA colonization surveillance program. It is not intended to diagnose MRSA infection nor to guide or monitor treatment for MRSA infections. RESULT CALLED TO, READ BACK BY AND VERIFIED WITH: C.BAYNES,RN [id number] 06/17/2020 GM Performed at [facility], [address] [place], [place] [address]   Culture, Respiratory w Gram Stain     Status: None   Collection Time: 06/19/2020  1:23 PM   Specimen: Tracheal Aspirate; Respiratory  Result Value Ref Range Status   Specimen Description   Final    TRACHEAL ASPIRATE Performed at [facility], [address], [place], [place] [address]    Special Requests   Final    NONE Performed at [facility], [address] [place], [place] [address]  Gram Stain   Final    FEW WBC PRESENT,BOTH PMN AND MONONUCLEAR MODERATE GRAM POSITIVE COCCI IN PAIRS RARE GRAM POSITIVE RODS Performed at [facility], [place] [address] [place], [place] [address]    Culture   Final    RARE PSEUDOMONAS AERUGINOSA FEW METHICILLIN RESISTANT STAPHYLOCOCCUS AUREUS    Report Status 07/06/2020  FINAL  Final   Organism ID, Bacteria PSEUDOMONAS AERUGINOSA  Final   Organism ID, Bacteria METHICILLIN RESISTANT STAPHYLOCOCCUS AUREUS  Final      Susceptibility   Methicillin resistant staphylococcus aureus - MIC*    CIPROFLOXACIN <=0.5 SENSITIVE Sensitive     ERYTHROMYCIN >=8 RESISTANT Resistant     GENTAMICIN <=0.5 SENSITIVE Sensitive     OXACILLIN >=4 RESISTANT Resistant     TETRACYCLINE <=1 SENSITIVE Sensitive     VANCOMYCIN 1 SENSITIVE Sensitive     TRIMETH/SULFA <=10 SENSITIVE Sensitive     CLINDAMYCIN <=0.25 SENSITIVE Sensitive     RIFAMPIN <=0.5 SENSITIVE Sensitive     Inducible Clindamycin NEGATIVE Sensitive     * FEW METHICILLIN RESISTANT STAPHYLOCOCCUS AUREUS   Pseudomonas aeruginosa - MIC*    CEFTAZIDIME 2 SENSITIVE Sensitive     CIPROFLOXACIN <=0.25 SENSITIVE Sensitive     GENTAMICIN <=1 SENSITIVE Sensitive     IMIPENEM 2 SENSITIVE Sensitive     PIP/TAZO <=4 SENSITIVE Sensitive     CEFEPIME 2 SENSITIVE Sensitive     * RARE PSEUDOMONAS AERUGINOSA  Urine culture     Status: None   Collection Time: 07/09/2020  2:44 PM   Specimen: Urine, Random  Result Value Ref Range Status   Specimen Description   Final    URINE, RANDOM Performed at [facility], [address], [place], [place] [address]    Special Requests   Final    NONE Performed at [facility], [address], [place], [place] [address]    Culture   Final    NO GROWTH Performed at [facility], [place] [address], [place], [place] [address]    Report Status 07/04/2020 FINAL  Final  Body fluid culture w Gram Stain     Status: None   Collection Time: 06/20/2020  6:04 PM   Specimen: Pleura; Body Fluid  Result Value Ref Range Status   Specimen Description   Final    PLEURAL Performed at [facility], [address] [place], [place] [address]    Special Requests   Final    PLEURAL Performed at [facility], [address] [place], [place] [address]    Gram Stain    Final    RARE WBC PRESENT,BOTH PMN AND MONONUCLEAR NO ORGANISMS SEEN    Culture   Final    NO GROWTH 3 DAYS Performed at [facility], [place] [address] [place], [place] [address]    Report Status 07/06/2020 FINAL  Final  Gastrointestinal Panel by PCR , Stool     Status: None   Collection Time: 07/10/20  5:00 PM   Specimen: Stool  Result Value Ref Range Status   Campylobacter species NOT DETECTED NOT DETECTED Final   Plesimonas shigelloides NOT DETECTED NOT DETECTED Final   Salmonella species NOT DETECTED NOT DETECTED Final   Yersinia enterocolitica NOT DETECTED NOT DETECTED Final   Vibrio species NOT DETECTED NOT DETECTED Final   Vibrio cholerae NOT DETECTED NOT DETECTED Final   Enteroaggregative E coli (EAEC) NOT DETECTED NOT DETECTED Final   Enteropathogenic E coli (EPEC) NOT DETECTED  NOT DETECTED Final   Enterotoxigenic E coli (ETEC) NOT DETECTED NOT DETECTED Final   Shiga like toxin producing E coli (STEC) NOT DETECTED NOT DETECTED Final   Shigella/Enteroinvasive E coli (EIEC) NOT DETECTED NOT DETECTED Final   Cryptosporidium NOT DETECTED NOT DETECTED Final   Cyclospora cayetanensis NOT DETECTED NOT DETECTED Final   Entamoeba histolytica NOT DETECTED NOT DETECTED Final   Giardia lamblia NOT DETECTED NOT DETECTED Final   Adenovirus F40/41 NOT DETECTED NOT DETECTED Final   Astrovirus NOT DETECTED NOT DETECTED Final   Norovirus GI/GII NOT DETECTED NOT DETECTED Final   Rotavirus A NOT DETECTED NOT DETECTED Final   Sapovirus (I, II, IV, and V) NOT DETECTED NOT DETECTED Final    Comment: Performed at [facility], [address], [place], [place] [address]  C Difficile Quick Screen (NO PCR Reflex)     Status: None   Collection Time: 07/10/20  5:00 PM   Specimen: STOOL  Result Value Ref Range Status   C Diff antigen NEGATIVE NEGATIVE Final   C Diff toxin NEGATIVE NEGATIVE Final   C Diff interpretation No C. difficile detected.  Final    Comment: Performed at  [facility], [address] [place], [place] [address]    Lipid Panel Recent Labs    07/11/20 0424  TRIG 161*    Studies/Results: DG Chest Port 1 View  Result Date: 07/11/2020 CLINICAL DATA:  Acute respiratory failure. EXAM: PORTABLE CHEST 1 VIEW COMPARISON:  07/10/2020 FINDINGS: 0316 hours. Endotracheal tube tip is 5 cm above the base of the carina. Right IJ central line remains in place. The NG tube passes into the stomach although the distal tip position is not included on the film. No pneumothorax or pleural effusion. Basilar atelectasis again noted without substantial pulmonary edema. Telemetry leads overlie the chest. IMPRESSION: Stable exam. Bibasilar atelectasis. Electronically Signed   By: Misty Stanley M.D.   On: 07/11/2020 07:47   DG Chest Port 1 View  Result Date: 07/10/2020 CLINICAL DATA:  Respiratory distress. EXAM: PORTABLE CHEST 1 VIEW COMPARISON:  Radiograph earlier today, chest CT yesterday. FINDINGS: Endotracheal tube tip is 6.4 cm from the carina. Enteric tube tip below the diaphragm in the stomach. Right internal jugular central venous catheter tip overlies the lower SVC. Improved aeration of the left lower lobe with likely re-expansion, mild residual atelectasis. Similar right lung base atelectasis. Stable heart size and mediastinal contours. No pneumothorax. Minimal blunting of the costophrenic angles may represent small effusions. IMPRESSION: 1. Improved aeration of the left lower lobe with likely re-expansion, mild residual atelectasis. Similar right lung base atelectasis. 2. Possible small pleural effusions. 3. Endotracheal tube tip is 6.4 cm from the carina. Enteric tube and right central line remain in place Electronically Signed   By: Keith Rake M.D.   On: 07/10/2020 15:55   EEG adult  Result Date: 07/10/2020 Lora Havens, MD     07/10/2020  4:25 PM Patient Name: Tamara Mcclure MRN: [id number] Epilepsy Attending: Lora Havens Referring  Physician/Provider: Darel Hong, NP Date: 07/10/2020 Duration: 29.53 mins Patient history: 65 year old female with altered mental status.  EEG to evaluate for seizures. Level of alertness: comatose AEDs during EEG study: Versed, Xanax Technical aspects: This EEG study was done with scalp electrodes positioned according to the 10-20 International system of electrode placement. Electrical activity was acquired at a sampling rate of 500Hz  and reviewed with a high frequency filter of 70Hz  and a low frequency filter of 1Hz . EEG  data were recorded continuously and digitally stored. Description: EEG showed continuous generalized 5 to 7 Hz theta as well as intermittent 2-3Hz  delta slowing. Hyperventilation and photic stimulation were not performed.   ABNORMALITY - Continuous slow, generalized IMPRESSION: This study is suggestive of moderate to severe diffuse encephalopathy, nonspecific etiology. No seizures or epileptiform discharges were seen throughout the recording. Priyanka Barbra Sarks    Medications: Scheduled:  ALPRAZolam  1 mg Per Tube TID   budesonide (PULMICORT) nebulizer solution  0.5 mg Nebulization BID   chlorhexidine gluconate (MEDLINE KIT)  15 mL Mouth Rinse BID   Chlorhexidine Gluconate Cloth  6 each Topical Daily   feeding supplement (PROSource TF)  45 mL Per Tube BID   free water  150 mL Per Tube Q4H   heparin  5,000 Units Subcutaneous Q8H   hydrALAZINE  10 mg Intravenous TID   ipratropium-albuterol  3 mL Nebulization Q4H   mouth rinse  15 mL Mouth Rinse 10 times per day   midodrine  10 mg Per Tube TID WC   multivitamin  15 mL Oral Daily   pantoprazole (PROTONIX) IV  40 mg Intravenous QHS   senna  2 tablet Oral QHS   [START ON 07/13/2020] thiamine injection  100 mg Intravenous Q24H   vancomycin variable dose per unstable renal function (pharmacist dosing)   Does not apply See admin instructions   Continuous:  cefTAZidime (FORTAZ)  IV     dexmedetomidine (PRECEDEX) IV infusion 1.2 mcg/kg/hr  (07/12/20 0800)   feeding supplement (OSMOLITE 1.5 CAL) 1,000 mL (07/11/20 1318)   fentaNYL infusion INTRAVENOUS 150 mcg/hr (07/12/20 0800)   lactated ringers 75 mL/hr at 07/12/20 0800   midazolam 10 mg/hr (07/12/20 0800)   norepinephrine (LEVOPHED) Adult infusion 6 mcg/min (07/12/20 0800)   thiamine injection Stopped (07/12/20 0644)   vasopressin 0.02 Units/min (07/12/20 0800)    Assessment: 65 year old female who presented with acute respiratory failure and AMS in the setting of possible benzodiazepine and opiate overuse at home. Neurology was consulted for persistent AMS of fluctuating severity. 1. Exam is consistent with her sedated state (on fentanyl, Versed and Precedex gtts).  2. MRI brain: No acute abnormality 3. EEG reveals continuous generalized slowing. The study is suggestive of moderate to severe diffuse encephalopathy, nonspecific etiology. No seizures or epileptiform discharges were seen throughout the recording. 4. Overall clinical picture is most consistent with a multifactorial delirium (hospital delirium, metabolic encephalopathy, septic encephalopathy) versus unusual case of prolonged benzo or EtOH withdrawal versus catatonia (history of depression), versus cefepime neurotoxicity. Low suspicion for seizures given no jerking or twitching seen on exams by myself or other providers. No neck stiffness to suggest a meningitis. 5. Overall impression is that her initial AMS was due to toxic effects of sedating medications in conjunction with acute respiratory failure, followed by an initial period of improved mentation, then subsequent worsening due to benzodiazepine withdrawal.  6. Severe leukocytosis. WBC of 42.8 K today.    Recommendations: 1. Continue Precedex for empiric treatment of possible benzodiazepine or EtOH withdrawal. Would taper this off last, after fentanyl and Versed have been stopped. 2. Agree with scheduled Xanax at 1 mg TID. This will be beneficial to prevent  reoccurrence of a withdrawal syndrome after she has been weaned off of Precedex and Versed. Was formerly on 2 mg TID Xanax at home, so if discharging her on the current 1 mg dose, she will have been partially detoxed. Of note, relative who is in the room today states  that she took may have actually been taking her Xanax at 4 mg TID, possibly with some additional doses consistent with overuse.   3. Continue supportive care and management of pulmonary disease and infection per CCM. 4. Neurohospitalist service will sign off. Please call if there are additional questions.    35 minutes spent in the neurological evaluation and management of this critically ill patient.    LOS: 10 days   @Electronically  signed: Dr. Kerney Elbe 07/12/2020  8:59 AM

## 2020-07-12 NOTE — Plan of Care (Signed)
Neuro: attempting to wean some sedation, versed down to 8,  Resp: stable on ventilator, thick tan secretions with deep suctioning CV: afebrile, weaning pressors,  monitoring H&H GIGU: foley in place, OG in place-tolerating feeds, BM x 1 Skin: intact, healing abrasions and bruising Social: Son came to visit this afternoon, all questions and concerns addressed  Problem: Education: Goal: Knowledge of General Education information will improve Description: Including pain rating scale, medication(s)/side effects and non-pharmacologic comfort measures Outcome: Not Progressing   Problem: Health Behavior/Discharge Planning: Goal: Ability to manage health-related needs will improve Outcome: Not Progressing   Problem: Clinical Measurements: Goal: Ability to maintain clinical measurements within normal limits will improve Outcome: Not Progressing Goal: Will remain free from infection Outcome: Not Progressing Goal: Diagnostic test results will improve Outcome: Not Progressing Goal: Respiratory complications will improve Outcome: Not Progressing Goal: Cardiovascular complication will be avoided Outcome: Not Progressing   Problem: Activity: Goal: Risk for activity intolerance will decrease Outcome: Not Progressing   Problem: Nutrition: Goal: Adequate nutrition will be maintained Outcome: Not Progressing   Problem: Coping: Goal: Level of anxiety will decrease Outcome: Not Progressing   Problem: Elimination: Goal: Will not experience complications related to bowel motility Outcome: Not Progressing Goal: Will not experience complications related to urinary retention Outcome: Not Progressing   Problem: Pain Managment: Goal: General experience of comfort will improve Outcome: Not Progressing   Problem: Safety: Goal: Ability to remain free from injury will improve Outcome: Not Progressing   Problem: Skin Integrity: Goal: Risk for impaired skin integrity will decrease Outcome: Not  Progressing   Problem: Activity: Goal: Ability to tolerate increased activity will improve Outcome: Not Progressing   Problem: Respiratory: Goal: Ability to maintain a clear airway and adequate ventilation will improve Outcome: Not Progressing   Problem: Role Relationship: Goal: Method of communication will improve Outcome: Not Progressing

## 2020-07-12 NOTE — Progress Notes (Addendum)
NAME:  Tamara Mcclure, MRN:  [id number], DOB:  1955-02-03, LOS: 51 ADMISSION DATE:  06/13/2020, CONSULTATION DATE: 07/08/2020 REFERRING MD:  Lindell Noe MD, CHIEF COMPLAINT:   Acute respiratory failure, pneumonia  History of Present Illness:   65 year old with history of COPD, depression, hypertension, panic attacks presenting with altered mental status, acute on chronic respiratory failure.  Patient had a fall a few days ago, seen at local urgent care.  Has been taking increasing doses of opiate.  In the ED she failed Narcan and BiPAP and got intubated.  PCCM consulted for admission  Pertinent  Medical History    has a past medical history of Anxiety, Chronic lower back pain, COPD (chronic obstructive pulmonary disease) ([place]), Depression, Hypertension, Panic disorder with agoraphobia and severe panic attacks, and Peripheral sensory neuropathy.   Micro Data:  06/29/2020: SARS-CoV-2 and influenza PCR>> negative 06/29/2020: HIV>> nonreactive 06/30/2020: Strep pneumo urinary antigen>> negative 06/25/2020: Legionella urinary antigen>>negative 07/01/2020: Pleural fluid>> no growth 06/14/2020: Blood culture>> no growth 06/15/2020: MRSA PCR>> positive 06/21/2020: Tracheal aspirate>> Pseudomonas Aeruginosa & MRSA 06/21/2020: Urine>> no growth 07/10/2020: Stool culture (GI panel)>> no growth 07/10/2020: C. difficile PCR: Negative  Antimicrobials:  Cefepime 6/23>> 7/1 Ceftriaxone 6/24>> 6/25 Flagyl 6/23>> 6/27 Vancomycin 6/23>> 6/27; re-started 7/1>> Linezolid 6/27>>7/1 PO Vancomycin 6/30>>7/02 Ceftazidime 7/1>>  Significant Hospital Events: Including procedures, antibiotic start and stop dates in addition to other pertinent events   6/23-admit, intubated, right chest tube placed, right IJ CVC placed 6/24: Weaning vent settings and Levophed (down to 8 mcg). Na+ corrected to 124 this morning (108 on admission) ~ D5 @ 75 started. Tracheal aspirate with Gram+ cocci in pairs & rare gram + rods ~  Cefepime changed to Ceftriaxone, Azithromycin d/c.  Pleural fluid consistent with EXUDATE ~ pleural fluid culture pending 6/26 remains on vent, critically ill 6/27 Successfully extubated  6/28: Chest tube removed, remains with AMS, CT Head negative 6/29: Remains altered, concern for ? Catatonia vs. ? Osmotic Demyelination Syndrome (serum Na+ noted to have rapidly corrected during 1st 24 hrs of admission), Obtain MRI Brain,  Psych consulted 6/30: MRI Brain yesterday normal, tracking today (intermittently follows commands), obtain Neurology consult. WBC increased to 51 (23.6) ~ Peripheral Smear Normal, steroids d/c, consider Hematology consult; get CTA Chest and CT Abdomen/Pelvis 7/1: Overnight with hypoxia and respiratory distress, required intubation.  ABX changed to Ceftazidime and Vancomycin.  Hematology consulted for severe Leukocytosis.  EEG pending 7/2: Remains vent dependent, FiO2 down to 55%.  Chest x-ray improved with recruitment maneuvers 7/3: No ventilator asynchrony, heavily sedated, IV sedatives being titrated off as tolerated, not ready for SBT  Scheduled Meds:  ALPRAZolam  1 mg Per Tube TID   budesonide (PULMICORT) nebulizer solution  0.5 mg Nebulization BID   chlorhexidine gluconate (MEDLINE KIT)  15 mL Mouth Rinse BID   Chlorhexidine Gluconate Cloth  6 each Topical Daily   feeding supplement (PROSource TF)  45 mL Per Tube BID   free water  150 mL Per Tube Q4H   heparin  5,000 Units Subcutaneous Q8H   hydrALAZINE  10 mg Intravenous TID   ipratropium-albuterol  3 mL Nebulization Q4H   mouth rinse  15 mL Mouth Rinse 10 times per day   midodrine  10 mg Per Tube TID WC   multivitamin  15 mL Oral Daily   pantoprazole (PROTONIX) IV  40 mg Intravenous QHS   senna  2 tablet Oral QHS   [START ON 07/13/2020] thiamine injection  100 mg Intravenous Q24H  vancomycin variable dose per unstable renal function (pharmacist dosing)   Does not apply See admin instructions   Continuous  Infusions:  cefTAZidime (FORTAZ)  IV     dexmedetomidine (PRECEDEX) IV infusion 1.2 mcg/kg/hr (07/12/20 4098)   feeding supplement (OSMOLITE 1.5 CAL) 1,000 mL (07/11/20 1318)   fentaNYL infusion INTRAVENOUS 150 mcg/hr (07/12/20 0600)   lactated ringers 75 mL/hr at 07/12/20 0600   midazolam 10 mg/hr (07/12/20 1191)   norepinephrine (LEVOPHED) Adult infusion 6 mcg/min (07/12/20 0600)   thiamine injection 500 mg (07/12/20 0614)   vasopressin 0.02 Units/min (07/12/20 0600)   PRN Meds:.acetaminophen, acetaminophen, chlorpheniramine-HYDROcodone, fentaNYL (SUBLIMAZE) injection, fentaNYL (SUBLIMAZE) injection, guaiFENesin, ipratropium-albuterol, labetalol, midazolam, sodium phosphate   Interim History / Subjective:  -Overnight, no issues -Afebrile, requiring Levophed and Vasopressin but dose is decreasing -WBC decreased to 42.8 K today (82K yesterday), steroids DC'd 6/30 :. leukemoid reaction -ABX changed to Ceftazidime and Vancomycin 7/1 due to potential of encephalopathy with linezolid and cefepime -Renal function improved with volume resuscitation -Pt is critically ill with underlying severe emphysema, poor prognosis ~ Palliative Care following  Objective   Blood pressure (!) 91/59, pulse 64, temperature (!) 97.34 F (36.3 C), temperature source Esophageal, resp. rate 16, height _0  (1.651 m), weight 65.8 kg, SpO2 100 %. CVP:  [0 mmHg-15 mmHg] 4 mmHg  Vent Mode: PRVC FiO2 (%):  [55 %] 55 % Set Rate:  [16 bmp] 16 bmp Vt Set:  [400 mL] 400 mL PEEP:  [10 cmH20] 10 cmH20 Plateau Pressure:  [14 cmH20-22 cmH20] 14 cmH20   Intake/Output Summary (Last 24 hours) at 07/12/2020 0804 Last data filed at 07/12/2020 0600 Gross per 24 hour  Intake 3506.93 ml  Output 2910 ml  Net 596.93 ml    Filed Weights   07/10/20 0500 07/11/20 0423 07/12/20 0500  Weight: 59.6 kg 62.1 kg 65.8 kg    Examination:  Gen:   Chronically on critically ill-appearing female, laying in bed, intubated and sedated,  synchronous with the ventilator HEENT: Atraumatic, normocephalic, EOMI, sclera anicteric Neck:     No masses; no thyromegaly, no JVD, ETT in place Lungs:    Coarse breath sounds throughout, no wheezing, synchronous with the vent, even CV:         Regular rate, Regular rhythm; S1 S2, no M/R/G, good peripheral circulation Abd:     + bowel sounds; soft, non-tender; no palpable masses, no distension Ext:   No edema; adequate peripheral perfusion Skin:      Warm and dry; no obvious rashes, lesions, ulcerations Neuro: Sedated, unable to follow commands,  pupils PERRLA Psych: Unable to assess due to AMS/intubation/sedation  Labs/imaging that I havepersonally reviewed  (right click and "Reselect all SmartList Selections" daily)   Labs 07/09/2020: glucose 160, BUN 76, Cr. 2.07, PCT 1.18, WBC 88.9 (with Neutrophilia and mild left shift), Platelets 411 ABG: pH 7.39/pCO2 41/pO2 67/Bicarb 24.8  CTA Chest 6/30>>1.  No evidence of pulmonary embolism. 2. Aspirated debris in the trachea and bilateral lower lobe bronchi with atelectatic collapse of the left lower lobe. CT Abdomen/Pelvis w/ contrast 6/30>> Large stool burden in the rectum with edema in the presacral space may reflect proctitis. MRI Brain 6/29>>1. No acute intracranial abnormality identified on this motion degraded study. 2. Sinusitis. 3. 1.1 cm nodule along the right parotid tail, indeterminate though may reflect a lymph node or small parotid neoplasm. CT Head 6/28>>No acute abnormality. No evidence of cerebral edema or infarction. No intracranial hemorrhage CT Chest w/o contrast 6/28>>IMPRESSION: 1. Largely  resolved right pleural effusion with pleural drain in place. Mild or trace residual fluid and/or thickening in the right costophrenic angle. 2. Small volume retained secretions in the airways. No convincing pneumonia. Emphysema (ICD10-J43.9). 3. Aortic Atherosclerosis (ICD10-I70.0). CT head 6/23>>1. No acute intracranial  abnormalities. 2. Chronic sinus inflammation. CT cervical spine 6/23>>No evidence for cervical spine fracture.  Advanced cervical spondylosis CT abdomen pelvis 6/23>>1. No definite CT evidence of acute traumatic injury to the chest, abdomen, or pelvis. 2. Large right pleural effusion and associated atelectasis or consolidation. No displaced rib fracture or other evident etiology. 3. There are wedge deformities of the T7 and T8 vertebral bodies, proximally 30% anterior height loss, which are new compared to most recent imaging of the chest dated 09/29/2014 but age indeterminate. Correlate for acute point tenderness. 4. Emphysema. 5. Coronary artery disease.  Resolved Hospital Problem list   Atelectasis left base resolved - Chest x-ray 07/02:   Assessment & Plan:   Acute on chronic respiratory failure due to COPD exacerbation, right lower lobe pneumonia (Pseudomonas & MRSA), & large Right Pleural Effusion -Full vent support, continue lung protective strategies -Wean FiO2 & PEEP as tolerated to maintain O2 sats 88 to 92% -Follow intermittent Chest X-ray & ABG as needed -Spontaneous Breathing Trials when respiratory parameters met and mental status permits -Altered mental status precludes SBT -Continue VAP Bundle -Continue bronchodilators -Steroids d/c 6/30 due to severe Leukocytosis -ABX changed to Ceftazidime and Vancomycin due to potential with encephalopathy with linezolid/cefepime (give ABX total 14 days treatment) -CTA Chest 6/30 negative for PE, concern for aspirated debris in trachea and bilateral LL bronchi, with atelectatic collapse of LLL, and very severe COPD -Chest tube placement on 06/14/2020 ~ removed on 07/07/20 -Pleural fluid consistent with EXUDATE (Lights Criteria: Pleural LDH  979 is > 2/3 UNL of serum LDH (112) -Pleural fluid culture with no growth ~ consistent with PARAPNEUMONIC Effusion, resolved.  Hypovolemic shock, resolving Septic shock, resolved -Continuous  cardiac monitoring -Maintain MAP greater than 65 -Vasopressors as needed to maintain MAP goal -Vasopressors being weaned off -Add Midodrine 10 mg TID -Volume resuscitate by CVP -Serum cortisol = 34.8 7/1 -Echocardiogram on 06/20/2020 with LVEF 60 to 47%, grade 1 diastolic dysfunction, RV systolic function normal  Sepsis present on admission secondary to Pseudomonas & MRSA Pneumonia Severe Leukocytosis -Monitor fever curve -Trend WBCs and procalcitonin -Follow cultures as above -ABX changed to IV ceftazidime and IV vancomycin -Peripheral smear on 6/30 with Morphology of RBCs, WBCs, and platelets within normal limits. -Steroids d/c on 6/30 -WBC increased to 88.9 K 07/10/20, 42.8 K today 07/12/20 -Hematology consulted: Leukemoid reaction  Acute Kidney Injury (Pre renal azotemia) -solving Mild Hypernatremia due to poor PO intake>>resolved -Monitor I&O's / urinary output -Follow BMP  -Ensure adequate renal perfusion -Avoid nephrotoxic agents as able -Replace electrolytes as indicated -Pharmacy following for assistance with electrolyte replacement -Continue free water flushes/feeds -Volume resuscitate according to CVP  Acute Metabolic Encephalopathy  Possible concomitant withdrawal syndrome Sedation needs in setting of mechanical ventilation PMHx of Chronic opiate abuse -Maintain a RASS goal of 0 to -1 -Fentanyl, Versed, and Precedex to maintain RASS goal -Weaning Versed off -Resumed home Xanax at lower dose, due to concern for withdrawal -Avoid sedating medications as able -Daily wake up assessment as able -CT head 07/07/2020 & MRI Brain 07/08/20 both negative for acute intracranial abnormality -Psychiatry and Neurology following, appreciate input -EEG pending -Continue thiamine supplementation -On ceftazidime and  Vancomycin   Anemia No signs of blood loss May be  dilutional effect Aggravated by critical illness Patient aggressively volume resuscitated Fluids to [facility] Monitor  H&H Transfuse for Hgb< 7  Severe protein calorie malnutrition Nutrition Status: Nutrition Problem: Inadequate oral intake Etiology: inability to eat (ventilated) Signs/Symptoms: NPO status Interventions: Refer to RD note for recommendations patient started on tube feeds Protein supplementation    Best Practice (right click and "Reselect all SmartList Selections" daily)   Diet/type: Begin tube feeds 7/2 Pain/Anxiety/Delirium protocol yes VAP protocol (if indicated): yes DVT prophylaxis: prophylactic heparin  GI prophylaxis: PPI Glucose control:  yes, SSI Central venous access:  yes, and is still indicated Arterial line:  N/A Foley:  N/A Mobility:  bed rest  PT consulted: N/A Studies pending: EEG Culture data pending: N/A Last reviewed culture data:today Antibiotics: Ceftazidime, Vancomycin, p.o. vancomycin DC'd 7/2 Flagyl DC'd 7/2 Antibiotic de-escalation:  N/A, on appropriate ABX Stop date: to be determined enthesis 14 days total) Daily labs: requested Code Status:  full code Last date of multidisciplinary goals of care discussion [07/10/20] Disposition: remains critically ill, will stay in intensive care  Pt is critically ill, prognosis is extremely guarded, and long term prognosis is poor.  High risk for cardiac arrest and death.  Recommend DNR/DNI status.  Ongoing discussion with patient's son with regards to [place].   Labs   CBC: Recent Labs  Lab 07/09/20 0550 07/09/20 0850 07/10/20 0439 07/11/20 0423 07/12/20 0504  WBC 47.4* 51.0* 88.9* 82.0* 42.8*  NEUTROABS  --  43.0* 78.1* 72.9* 38.6*  HGB 13.0 12.9 12.1 9.5* 7.6*  HCT 38.7 38.1 36.3 28.3* 23.5*  MCV 90.6 92.3 97.3 95.6 99.2  PLT 425* 392 411* 392 308     Basic Metabolic Panel: Recent Labs  Lab 07/08/20 2043 07/09/20 0850 07/09/20 1707 07/10/20 0439 07/10/20 0455 07/11/20 0423 07/12/20 0504  NA 143 147*  --  143  --  142 146*  K 4.0 3.6  --  4.5  --  4.6 3.7  CL 103 107  --  109  --  114*  119*  CO2 29 27  --  24  --  21* 24  GLUCOSE 155* 127*  --  160*  --  145* 194*  BUN 25* 41*  --  76*  --  68* 33*  CREATININE 0.80 1.01*  --  2.07*  --  1.90* 0.91  CALCIUM 9.1 8.9  --  8.0*  --  7.6* 7.9*  MG 2.5*  --  2.7* 3.0* 2.9* 2.6* 2.1  PHOS  --   --  4.2 8.3* 7.5* 6.8* 2.2*    GFR: Estimated Creatinine Clearance: 56.9 mL/min (by C-G formula based on SCr of 0.91 mg/dL). Recent Labs  Lab 07/09/20 0850 07/10/20 0439 07/10/20 1630 07/10/20 2020 07/11/20 0423 07/12/20 0504  PROCALCITON 0.22 1.18  --   --  3.00  --   WBC 51.0* 88.9*  --   --  82.0* 42.8*  LATICACIDVEN  --   --  1.8 1.6  --   --      Liver Function Tests: Recent Labs  Lab 07/11/20 0423  ALBUMIN 2.1*    No results for input(s): LIPASE, AMYLASE in the last 168 hours. No results for input(s): AMMONIA in the last 168 hours.  ABG    Component Value Date/Time   PHART 7.39 07/10/2020 0647   PCO2ART 41 07/10/2020 0647   PO2ART 67 (L) 07/10/2020 0647   HCO3 24.8 07/10/2020 0647   ACIDBASEDEF 0.2 07/10/2020 0647   O2SAT 92.8 07/10/2020 0647  Coagulation Profile: No results for input(s): INR, PROTIME in the last 168 hours.   Cardiac Enzymes: No results for input(s): CKTOTAL, CKMB, CKMBINDEX, TROPONINI in the last 168 hours.  HbA1C: Hgb A1c MFr Bld  Date/Time Value Ref Range Status  07/04/2020 01:25 PM 5.8 (H) 4.8 - 5.6 % Final    Comment:    (NOTE)         Prediabetes: 5.7 - 6.4         Diabetes: >6.4         Glycemic control for adults with diabetes: <7.0   03/11/2013 12:00 AM 5.8 4.0 - 6.0 % Final    CBG: Recent Labs  Lab 07/11/20 1615 07/11/20 2001 07/11/20 2338 07/12/20 0401 07/12/20 0750  GLUCAP 144* 165* 178* 165* 201*     Review of Systems:   Unable to obtain due to altered mental status/intubation & sedation/critical illness  Allergies Allergies  Allergen Reactions   Penicillins Rash    Has patient had a PCN reaction causing immediate rash,  facial/tongue/throat swelling, SOB or lightheadedness with hypotension: Yes Has patient had a PCN reaction causing severe rash involving mucus membranes or skin necrosis: No Has patient had a PCN reaction that required hospitalization: No Has patient had a PCN reaction occurring within the last 10 years: No If all of the above answers are "NO", then may proceed with Cephalosporin use.    Critical care time: 40 minutes   The patient is critically ill with multiple organ system failure and requires high complexity decision making for assessment and support, frequent evaluation and titration of therapies, advanced monitoring, review of radiographic studies and interpretation of complex data.    Renold Don, MD [facility]   *This note was dictated using voice recognition software/Dragon.  Despite best efforts to proofread, errors can occur which can change the meaning.  Any change was purely unintentional.

## 2020-07-13 DIAGNOSIS — R0603 Acute respiratory distress: Secondary | ICD-10-CM

## 2020-07-13 DIAGNOSIS — J15212 Pneumonia due to Methicillin resistant Staphylococcus aureus: Secondary | ICD-10-CM

## 2020-07-13 DIAGNOSIS — E875 Hyperkalemia: Secondary | ICD-10-CM

## 2020-07-13 DIAGNOSIS — E43 Unspecified severe protein-calorie malnutrition: Secondary | ICD-10-CM

## 2020-07-13 DIAGNOSIS — G9340 Encephalopathy, unspecified: Secondary | ICD-10-CM

## 2020-07-13 DIAGNOSIS — J151 Pneumonia due to Pseudomonas: Secondary | ICD-10-CM

## 2020-07-13 DIAGNOSIS — G9341 Metabolic encephalopathy: Secondary | ICD-10-CM

## 2020-07-13 DIAGNOSIS — N179 Acute kidney failure, unspecified: Secondary | ICD-10-CM

## 2020-07-13 DIAGNOSIS — Z9911 Dependence on respirator [ventilator] status: Secondary | ICD-10-CM

## 2020-07-13 DIAGNOSIS — D649 Anemia, unspecified: Secondary | ICD-10-CM

## 2020-07-13 LAB — CBC
HCT: 20.9 % — ABNORMAL LOW (ref 36.0–46.0)
HCT: 25.4 % — ABNORMAL LOW (ref 36.0–46.0)
Hemoglobin: 6.7 g/dL — ABNORMAL LOW (ref 12.0–15.0)
Hemoglobin: 8.1 g/dL — ABNORMAL LOW (ref 12.0–15.0)
MCH: 31.5 pg (ref 26.0–34.0)
MCH: 31.5 pg (ref 26.0–34.0)
MCHC: 32.1 g/dL (ref 30.0–36.0)
MCHC: 31.9 g/dL (ref 30.0–36.0)
MCV: 98.1 fL (ref 80.0–100.0)
MCV: 98.8 fL (ref 80.0–100.0)
Platelets: 261 10*3/uL (ref 150–400)
Platelets: 243 10*3/uL (ref 150–400)
RBC: 2.13 MIL/uL — ABNORMAL LOW (ref 3.87–5.11)
RBC: 2.57 MIL/uL — ABNORMAL LOW (ref 3.87–5.11)
RDW: 15.7 % — ABNORMAL HIGH (ref 11.5–15.5)
RDW: 15.9 % — ABNORMAL HIGH (ref 11.5–15.5)
WBC: 18.5 10*3/uL — ABNORMAL HIGH (ref 4.0–10.5)
WBC: 16.8 10*3/uL — ABNORMAL HIGH (ref 4.0–10.5)
nRBC: 0 % (ref 0.0–0.2)
nRBC: 0 % (ref 0.0–0.2)

## 2020-07-13 LAB — BASIC METABOLIC PANEL
Anion gap: 4 — ABNORMAL LOW (ref 5–15)
BUN: 24 mg/dL — ABNORMAL HIGH (ref 8–23)
CO2: 24 mmol/L (ref 22–32)
Calcium: 7.6 mg/dL — ABNORMAL LOW (ref 8.9–10.3)
Chloride: 120 mmol/L — ABNORMAL HIGH (ref 98–111)
Creatinine, Ser: 0.78 mg/dL (ref 0.44–1.00)
GFR, Estimated: >60 mL/min (ref >60)
Glucose, Bld: 152 mg/dL — ABNORMAL HIGH (ref 70–99)
Potassium: 3.6 mmol/L (ref 3.5–5.1)
Sodium: 148 mmol/L — ABNORMAL HIGH (ref 135–145)

## 2020-07-13 LAB — PREPARE RBC (CROSSMATCH)

## 2020-07-13 LAB — ABO/RH: ABO/RH(D): O POS

## 2020-07-13 LAB — GLUCOSE, CAPILLARY
Glucose-Capillary: 108 mg/dL — ABNORMAL HIGH (ref 70–99)
Glucose-Capillary: 118 mg/dL — ABNORMAL HIGH (ref 70–99)
Glucose-Capillary: 129 mg/dL — ABNORMAL HIGH (ref 70–99)
Glucose-Capillary: 132 mg/dL — ABNORMAL HIGH (ref 70–99)
Glucose-Capillary: 146 mg/dL — ABNORMAL HIGH (ref 70–99)
Glucose-Capillary: 149 mg/dL — ABNORMAL HIGH (ref 70–99)

## 2020-07-13 LAB — RETIC PANEL
Immature Retic Fract: 8.4 % (ref 2.3–15.9)
RBC.: 2.7 MIL/uL — ABNORMAL LOW (ref 3.87–5.11)
Retic Count, Absolute: 46.2 10*3/uL (ref 19.0–186.0)
Retic Ct Pct: 1.7 % (ref 0.4–3.1)
Reticulocyte Hemoglobin: 30.9 pg (ref >27.9)

## 2020-07-13 LAB — PHOSPHORUS: Phosphorus: 1.7 mg/dL — ABNORMAL LOW (ref 2.5–4.6)

## 2020-07-13 LAB — MAGNESIUM: Magnesium: 1.8 mg/dL (ref 1.7–2.4)

## 2020-07-13 MED ORDER — POTASSIUM & SODIUM PHOSPHATES 280-160-250 MG PO PACK
2.0000 | PACK | ORAL | Status: AC
  Administered 2020-07-13 (×3): 2
  Filled 2020-07-13 (×3): qty 2

## 2020-07-13 MED ORDER — SODIUM CHLORIDE 0.9% IV SOLUTION: Freq: Once | INTRAVENOUS | Status: AC

## 2020-07-13 MED ORDER — VANCOMYCIN HCL 1500 MG/300ML IV SOLN
1500.0000 mg | INTRAVENOUS | Status: DC
  Administered 2020-07-13: 1500 mg via INTRAVENOUS
  Filled 2020-07-13 (×2): qty 300

## 2020-07-13 NOTE — Progress Notes (Signed)
Hematology/Oncology Progress Note [facility] [phone] [phone]  Patient Care Team: Roselee Nova, MD as PCP - General (Family Medicine)   Name of the patient: Tamara Mcclure  [id number]  Dec 20, 1955  Date of visit: 07/13/20   INTERVAL HISTORY-  Patient remains intubated and sedated. Leukocytosis has trended down.  Hemoglobin 6.7 today.   Review of systems- Review of Systems  Unable to perform ROS: Other (Intubated and sedated)   Allergies  Allergen Reactions   Penicillins Rash    Has patient had a PCN reaction causing immediate rash, facial/tongue/throat swelling, SOB or lightheadedness with hypotension: Yes Has patient had a PCN reaction causing severe rash involving mucus membranes or skin necrosis: No Has patient had a PCN reaction that required hospitalization: No Has patient had a PCN reaction occurring within the last 10 years: No If all of the above answers are "NO", then may proceed with Cephalosporin use.    Patient Active Problem List   Diagnosis Date Noted   On mechanically assisted ventilation ([place]) 07/13/2020   Pseudomonas pneumonia ([place]) 07/13/2020   MRSA pneumonia ([place]) 07/13/2020   Acute kidney injury ([place]) 07/13/2020   Hyperkalemia 07/13/2020   Anemia 07/13/2020   Severe protein-calorie malnutrition ([place]) 65/53/7482   Acute metabolic encephalopathy 70/78/6754   Leukocytosis    Subacute delirium 07/08/2020   Pleural effusion on right 07/07/2020   Community acquired pneumonia 07/07/2020   Acute respiratory failure ([place]) 06/19/2020   Severe sepsis with septic shock ([place]) 02/08/2017   Chronic low back pain (Primary Area of Pain) (Bilateral) (R>L) 01/12/2017   Chronic pain of lower extremity (Secondary Area of Pain) (B (R>L) 01/12/2017   Chronic pain syndrome 01/12/2017   Chronic prescription opiate use 01/12/2017   Disorder of bone, unspecified 01/12/2017   Other long term (current) drug therapy  01/12/2017   Other specified health status 01/12/2017   Chronic sacroiliac joint pain 01/12/2017   Post-nasal drainage 10/31/2016   Joint stiffness of hand 04/07/2016   URI with cough and congestion 01/19/2016   Discoloration of skin of finger 11/26/2015   Need for home health care 07/20/2015   Decreased vision 07/20/2015   Acute sinusitis 07/16/2015   Bilateral leg pain 06/18/2015   Pain of left breast 05/21/2015   Lesion of skin of face 12/25/2014   Postprandial abdominal bloating 11/11/2014   COPD (chronic obstructive pulmonary disease) ([place]) 11/03/2014   Nasal sinus congestion 11/03/2014   Hypertension 09/02/2014   Dry skin 09/02/2014   Menopausal symptoms 08/05/2014   Peripheral sensory neuropathy 07/10/2014   Anxiety and depression 07/10/2014   Airway hyperreactivity 07/10/2014   At risk for falling 07/10/2014   Lumbosacral spondylosis 07/10/2014   DDD (degenerative disc disease), lumbosacral 07/10/2014   Discharge of ear 07/10/2014   Degenerative arthritis of lumbar spine 07/10/2014   Hyperlipidemia 07/10/2014   Dysfunction of eustachian tube 07/10/2014   ANA positive 07/10/2014   Hypomagnesemia 07/10/2014   Hypokalemia 07/10/2014   Cerumen impaction 07/10/2014   Cramps of lower extremity 07/10/2014   Leg swelling 07/10/2014   Spasm 07/10/2014   Compulsive tobacco user syndrome 07/10/2014   Atrophy of vagina 07/10/2014   Chronic radicular low back pain 06/11/2014   Insomnia 06/11/2014   Panic disorder with agoraphobia and severe panic attacks 06/11/2014   Foot pain 12/17/2013   Burning sensation of feet 12/17/2013   Numbness and tingling 12/17/2013   CAFL (chronic airflow limitation) ([place]) 06/21/2011   Clinical depression 06/21/2011  Past Medical History:  Diagnosis Date   Anxiety    Chronic lower back pain    COPD (chronic obstructive pulmonary disease) ([facility])    Depression    Hypertension    Panic disorder with agoraphobia and severe panic attacks     Peripheral sensory neuropathy      Past Surgical History:  Procedure Laterality Date   I & D EXTREMITY Right 02/09/2017   Procedure: IRRIGATION AND DEBRIDEMENT EXTREMITY;  Surgeon: Lovell Sheehan, MD;  Location: [facility] ORS;  Service: Orthopedics;  Laterality: Right;   none      Social History   Socioeconomic History   Marital status: Single    Spouse name: Not on file   Number of children: Not on file   Years of education: Not on file   Highest education level: Not on file  Occupational History   Not on file  Tobacco Use   Smoking status: Every Day    Packs/day: 1.00    Years: 30.00    Pack years: 30.00    Types: Cigarettes, E-cigarettes   Smokeless tobacco: Never  Vaping Use   Vaping Use: Every day  Substance and Sexual Activity   Alcohol use: No   Drug use: Yes    Comment: Pt states prescribed oxy and xanax   Sexual activity: Not Currently  Other Topics Concern   Not on file  Social History Narrative   Not on file   Social Determinants of Health   Financial Resource Strain: Not on file  Food Insecurity: Not on file  Transportation Needs: Not on file  Physical Activity: Not on file  Stress: Not on file  Social Connections: Not on file  Intimate Partner Violence: Not on file     Family History  Problem Relation Age of Onset   Cancer Mother        Lung   Cancer Father        pancreatic   Breast cancer Neg Hx    Ovarian cancer Neg Hx    Colon cancer Neg Hx    Diabetes Neg Hx    Heart disease Neg Hx      Current Facility-Administered Medications:    0.9 %  sodium chloride infusion (Manually program via Guardrails IV Fluids), , Intravenous, Once, Rust-Chester, Toribio Harbour L, NP   acetaminophen (TYLENOL) suppository 325 mg, 325 mg, Rectal, Q4H PRN, Fritzi Mandes, MD, 325 mg at 07/11/20 1709   acetaminophen (TYLENOL) tablet 650 mg, 650 mg, Oral, Q6H PRN, Fritzi Mandes, MD, 650 mg at 07/09/20 1354   ALPRAZolam (XANAX) tablet 1 mg, 1 mg, Per Tube, TID, Tyler Pita, MD, 1 mg at 07/13/20 1026   budesonide (PULMICORT) nebulizer solution 0.5 mg, 0.5 mg, Nebulization, BID, Graves, Dana E, NP, 0.5 mg at 07/13/20 0812   cefTAZidime (FORTAZ) 2 g in sodium chloride 0.9 % 100 mL IVPB, 2 g, Intravenous, Q8H, Chappell, Alex B, RPH, Last Rate: 200 mL/hr at 07/13/20 0228, 2 g at 07/13/20 0228   chlorhexidine gluconate (MEDLINE KIT) (PERIDEX) 0.12 % solution 15 mL, 15 mL, Mouth Rinse, BID, Darel Hong D, NP, 15 mL at 07/13/20 0814   Chlorhexidine Gluconate Cloth 2 % PADS 6 each, 6 each, Topical, Daily, Mannam, Praveen, MD, 6 each at 07/13/20 0100   dexmedetomidine (PRECEDEX) 400 MCG/100ML (4 mcg/mL) infusion, 0.4-1.2 mcg/kg/hr, Intravenous, Titrated, Darel Hong D, NP, Last Rate: 17.88 mL/hr at 07/13/20 1025, 1.2 mcg/kg/hr at 07/13/20 1025   feeding supplement (OSMOLITE 1.5 CAL) liquid 1,000 mL,  1,000 mL, Per Tube, Continuous, Tyler Pita, MD, Last Rate: 45 mL/hr at 07/13/20 0039, 1,000 mL at 07/13/20 0039   feeding supplement (PROSource TF) liquid 45 mL, 45 mL, Per Tube, BID, Flora Lipps, MD, 45 mL at 07/13/20 1029   fentaNYL (SUBLIMAZE) injection 50 mcg, 50 mcg, Intravenous, Q15 min PRN, Lang Snow, NP, 50 mcg at 07/10/20 0520   fentaNYL (SUBLIMAZE) injection 50-200 mcg, 50-200 mcg, Intravenous, Q30 min PRN, Lang Snow, NP, 100 mcg at 07/10/20 2053   fentaNYL 2540mg in NS 2546m(1035mml) infusion-PREMIX, 50-200 mcg/hr, Intravenous, Continuous, Ouma, EliBing NeighborsP, Last Rate: 15 mL/hr at 07/13/20 1020, 150 mcg/hr at 07/13/20 1020   free water 200 mL, 200 mL, Per Tube, Q4H, GonTyler PitaD, 200 mL at 07/13/20 0817   heparin injection 5,000 Units, 5,000 Units, Subcutaneous, Q8H, Mannam, Praveen, MD, 5,000 Units at 07/13/20 0551   ipratropium-albuterol (DUONEB) 0.5-2.5 (3) MG/3ML nebulizer solution 3 mL, 3 mL, Nebulization, Q6H PRN, Graves, Dana E, NP   ipratropium-albuterol (DUONEB) 0.5-2.5 (3) MG/3ML nebulizer  solution 3 mL, 3 mL, Nebulization, Q4H, KeeDarel Hong NP, 3 mL at 07/13/20 [id number] mouth rinse, 15 mL, Mouth Rinse, 10 times per day, KeeDarel Hong NP, 15 mL at 07/13/20 0553   midazolam (VERSED) 50 mg/50 mL (1 mg/mL) premix infusion, 0.5-10 mg/hr, Intravenous, Continuous, Ouma, EliBing NeighborsP, Last Rate: 6 mL/hr at 07/13/20 1021, 6 mg/hr at 07/13/20 1021   midazolam (VERSED) bolus via infusion 2 mg, 2 mg, Intravenous, Q2H PRN, Rust-Chester, BriToribio Harbour NP, 2 mg at 07/12/20 1642   midodrine (PROAMATINE) tablet 10 mg, 10 mg, Per Tube, TID WC, KeeDarel Hong NP, 10 mg at 07/13/20 1027   multivitamin liquid 15 mL, 15 mL, Oral, Daily, PatFritzi MandesD, 15 mL at 07/13/20 1027   norepinephrine (LEVOPHED) 16 mg in 250m25memix infusion, 0-40 mcg/min, Intravenous, Titrated, Ouma, ElizBing Neighbors, Stopped at 07/12/20 1927   pantoprazole (PROTONIX) injection 40 mg, 40 mg, Intravenous, QHS, Mannam, Praveen, MD, 40 mg at 07/12/20 2307   potassium & sodium phosphates (PHOS-NAK) 280-160-250 MG packet 2 packet, 2 packet, Per Tube, Q4H, KasaFlora Lipps, 2 packet at 07/13/20 1027   senna (SENOKOT) tablet 17.2 mg, 2 tablet, Oral, QHS, GrubDallie PilesH, 17.2 mg at 07/12/20 2307   sodium phosphate (FLEET) 7-19 GM/118ML enema 1 enema, 1 enema, Rectal, Once PRN, PateFritzi Mandes   [COMPLETED] thiamine 500mg42mnormal saline (50ml)10mB, 500 mg, Intravenous, Q8H, Last Rate: 100 mL/hr at 07/13/20 0553, 500 mg at 07/13/20 0553 **FOLLOWED BY** thiamine (B-1) injection 100 mg, 100 mg, Intravenous, Q24H, GonzalTyler Pita vancomycin (VANCOREADY) IVPB 1500 mg/300 mL, 1,500 mg, Intravenous, Q24H, Kasa, Kurian, MD   vasopressin (PITRESSIN) 20 Units in sodium chloride 0.9 % 100 mL infusion-*FOR SHOCK*, 0-0.04 Units/min, Intravenous, Continuous, Rust-Chester, Britton L, NP, Last Rate: 6 mL/hr at 07/13/20 0623, 0.02 Units/min at 07/13/20 0623   Physical exam:  Vitals:   07/13/20 0630  07/13/20 0645 07/13/20 0700 07/13/20 0816  BP:      Pulse: 73 74 75 76  Resp: 11 14 13 19   Temp: 98.6 F (37 C) 98.6 F (37 C) 98.6 F (37 C) 99.3 F (37.4 C)  TempSrc:    Oral  SpO2: 100% 100% 100% 98%  Weight:      Height:       Physical Exam Constitutional:      Comments: Intubated and  sedated.  HENT:     Head: Normocephalic and atraumatic.  Eyes:     General: No scleral icterus. Cardiovascular:     Rate and Rhythm: Normal rate and regular rhythm.     Heart sounds: No murmur heard. Pulmonary:     Comments: Intubated Abdominal:     Palpations: Abdomen is soft.  Musculoskeletal:        General: No swelling.  Skin:    General: Skin is warm.     Coloration: Skin is pale.     Findings: No erythema.  Neurological:     Motor: No abnormal muscle tone.  Psychiatric:        Mood and Affect: Affect normal.       CMP Latest Ref Rng & Units 07/13/2020  Glucose 70 - 99 mg/dL 152(H)  BUN 8 - 23 mg/dL 24(H)  Creatinine 0.44 - 1.00 mg/dL 0.78  Sodium 135 - 145 mmol/L 148(H)  Potassium 3.5 - 5.1 mmol/L 3.6  Chloride 98 - 111 mmol/L 120(H)  CO2 22 - 32 mmol/L 24  Calcium 8.9 - 10.3 mg/dL 7.6(L)  Total Protein 6.5 - 8.1 g/dL -  Total Bilirubin 0.3 - 1.2 mg/dL -  Alkaline Phos 38 - 126 U/L -  AST 15 - 41 U/L -  ALT 0 - 44 U/L -   CBC Latest Ref Rng & Units 07/13/2020  WBC 4.0 - 10.5 K/uL 18.5(H)  Hemoglobin 12.0 - 15.0 g/dL 6.7(L)  Hematocrit 36.0 - 46.0 % 20.9(L)  Platelets 150 - 400 K/uL 261    RADIOGRAPHIC STUDIES: I have personally reviewed the radiological images as listed and agreed with the findings in the report. DG Chest 1 View  Result Date: 07/10/2020 CLINICAL DATA:  Intubated EXAM: CHEST  1 VIEW COMPARISON:  07/09/2020 FINDINGS: Endotracheal tube is 2 cm above the carina. Right central line is unchanged. NG tube enters the stomach. Heart is normal size. Left lower lobe atelectasis, progressing since prior study. Right lung clear. No effusions. No acute bony  abnormality. IMPRESSION: Endotracheal tube 2 cm above the carina. Worsening left lower lobe atelectasis. Electronically Signed   By: Rolm Baptise M.D.   On: 07/10/2020 01:02   DG Chest 1 View  Result Date: 07/08/2020 CLINICAL DATA:  Dyspnea EXAM: CHEST  1 VIEW COMPARISON:  07/08/2020, 07/06/2020, 07/05/2020 FINDINGS: Right-sided central venous catheter tip over the SVC. Airspace disease at the left lung base. Stable cardiomediastinal silhouette. No pneumothorax. IMPRESSION: Increased airspace disease at left base since radiograph earlier today, could be secondary to atelectasis or aspiration. Electronically Signed   By: Donavan Foil M.D.   On: 07/08/2020 21:00   DG Chest 1 View  Result Date: 07/06/2020 CLINICAL DATA:  Pleural effusion on the right EXAM: CHEST  1 VIEW COMPARISON:  Yesterday FINDINGS: Endotracheal tube with tip between the clavicular heads and carina. The enteric tube reaches the stomach. Right IJ line with tip at the SVC. Chest tube on the right. No visible pneumothorax. Increased hazy density at both lung bases. Normal heart size. Notably severe left glenohumeral osteoarthritis. IMPRESSION: 1. Stable hardware positioning.  No visible pneumothorax. 2. Worsened aeration with increased opacity at the bases. Electronically Signed   By: Monte Fantasia M.D.   On: 07/06/2020 07:18   DG Abd 1 View  Result Date: 07/09/2020 CLINICAL DATA:  NG tube placement. EXAM: ABDOMEN - 1 VIEW COMPARISON:  06/28/2020. FINDINGS: Right IJ line noted with tip over SVC. NG tube is been advanced. NG tube noted with tip  over the stomach. Stool noted throughout the colon. Mild bibasilar atelectasis. IMPRESSION: NG tube is been advanced. NG tube noted with tip over the distal stomach in good anatomic location. Electronically Signed   By: Marcello Moores  Register   On: 07/09/2020 12:57   DG Abd 1 View  Result Date: 06/11/2020 CLINICAL DATA:  65 year old female status post NG placement. EXAM: ABDOMEN - 1 VIEW COMPARISON:   Earlier radiograph dated 07/01/2020. FINDINGS: Partially visualized enteric tube with side port in the region of the GE junction. Recommend further advancing of the tube by additional 7 cm. A pigtail drainage catheter noted at the right lung base. Partially visualized small right pleural effusion. There is large stool within the colon. Similar appearance of bowel gas pattern. The osseous structures are intact. IMPRESSION: 1. Enteric tube with side port in the region of the GE junction. Recommend further advancing of the tube by additional 7 cm. 2. Pigtail catheter with tip over the right lung base. Partially visualized small right pleural effusion. Electronically Signed   By: Anner Crete M.D.   On: 06/28/2020 18:42   DG Abdomen 1 View  Result Date: 07/06/2020 CLINICAL DATA:  Evaluate NG tube placement EXAM: ABDOMEN - 1 VIEW COMPARISON:  None FINDINGS: The tip of the NG tube is below the level of the GE junction the gastric fundus. The side port is just above the GE junction. Gaseous distension of the stomach noted. Large stool burden identified within the colon. Right pleural effusion. IMPRESSION: The tip of the NG tube is in the gastric fundus with side port just above the GE junction. Consider advancing by approximately 4 cm such that the side port is situated below the level of the GE junction. Electronically Signed   By: Kerby Moors M.D.   On: 07/04/2020 12:06   CT HEAD WO CONTRAST  Result Date: 07/07/2020 CLINICAL DATA:  Anoxic brain damage EXAM: CT HEAD WITHOUT CONTRAST TECHNIQUE: Contiguous axial images were obtained from the base of the skull through the vertex without intravenous contrast. COMPARISON:  CT head 06/10/2020 FINDINGS: Brain: Ventricle size and cerebral volume normal. Negative for cerebral edema. No significant effacement of the sulci compared to the prior study. No acute hemorrhage or infarct or mass. Vascular: Atherosclerotic calcification in the carotid and vertebral arteries.  Negative for hyperdense vessel Skull: Negative Sinuses/Orbits: Air-fluid level sphenoid sinus has enlarged since the prior study. Mucosal edema throughout the maxillary sinus and ethmoid sinuses bilaterally. Negative orbit Other: None IMPRESSION: No acute abnormality. No evidence of cerebral edema or infarction. No intracranial hemorrhage Electronically Signed   By: Franchot Gallo M.D.   On: 07/07/2020 11:25   CT Head Wo Contrast  Result Date: 07/09/2020 CLINICAL DATA:  Head trauma.  Fall several days ago. EXAM: CT HEAD WITHOUT CONTRAST CT CERVICAL SPINE WITHOUT CONTRAST TECHNIQUE: Multidetector CT imaging of the head and cervical spine was performed following the standard protocol without intravenous contrast. Multiplanar CT image reconstructions of the cervical spine were also generated. COMPARISON:  04/11/2014 FINDINGS: CT HEAD FINDINGS Brain: No evidence of acute infarction, hemorrhage, hydrocephalus, extra-axial collection or mass lesion/mass effect. Vascular: No hyperdense vessel or unexpected calcification. Skull: Normal. Negative for fracture or focal lesion. Sinuses/Orbits: Opacification of the maxillary sinuses, ethmoid air cells noted. Sphenoid sinus mucosal thickening. Mastoid air cells are clear. Other: None CT CERVICAL SPINE FINDINGS Alignment: 4 mm anterolisthesis of L3 on L4 is identified, image 28/6. Skull base and vertebrae: No acute fracture. No primary bone lesion or focal pathologic process.  Soft tissues and spinal canal: No prevertebral fluid or swelling. No visible canal hematoma. Disc levels: Marked multi level spondylosis identified throughout the cervical spine. Most advanced at C3-4, C4-5, C5-6 and C6-7. Bilateral facet arthropathy is also noted. Upper chest: Large right pleural effusion identified with opacification of the right upper lobe. Other: None IMPRESSION: 1. No acute intracranial abnormalities. 2. Chronic sinus inflammation. 3. No evidence for cervical spine fracture. 4.  Advanced cervical spondylosis. 5. Large right pleural effusion with opacification of the right upper lobe. Electronically Signed   By: Kerby Moors M.D.   On: 06/16/2020 13:26   CT CHEST WO CONTRAST  Result Date: 07/07/2020 CLINICAL DATA:  65 year old female with respiratory failure. COPD. Recent right pneumonia, pleural effusion status post chest tube. EXAM: CT CHEST WITHOUT CONTRAST TECHNIQUE: Multidetector CT imaging of the chest was performed following the standard protocol without IV contrast. COMPARISON:  Chest radiograph 07/06/2020 and earlier. Chest CT 06/29/2020. FINDINGS: Cardiovascular: Right IJ central line now in place terminating in the SVC. Calcified aortic atherosclerosis. Calcified coronary artery atherosclerosis. Vascular patency is not evaluated in the absence of IV contrast. No cardiomegaly or pericardial effusion. Mediastinum/Nodes: Enteric tube removed. Mediastinal lymph nodes appear stable and within normal limits. Lungs/Pleura: Pigtail type right pleural drain now in place with drainage of the right pleural effusion seen on the prior CT. Trace residual fluid and/or pleural thickening in the right costophrenic angle. No pneumothorax. Upper lobe predominant centrilobular emphysema. Small volume retained secretions located dependently in the trachea and right side airways. No residual lung consolidation. Small calcified granuloma along the surface of the right diaphragm is chronic and stable since 2016. No areas of worsening ventilation since 06/24/2020. No left pleural effusion. Upper Abdomen: Negative visible noncontrast liver, spleen, pancreas, adrenal glands, left kidney, and bowel in the upper abdomen. Musculoskeletal: Osteopenia. Respiratory motion at the lower ribs. Advanced chronic left glenohumeral joint degeneration. Stable T7 and T8 compression fractures. No new osseous abnormality. IMPRESSION: 1. Largely resolved right pleural effusion with pleural drain in place. Mild or trace  residual fluid and/or thickening in the right costophrenic angle. 2. Small volume retained secretions in the airways. No convincing pneumonia. Emphysema (ICD10-J43.9). 3. Aortic Atherosclerosis (ICD10-I70.0). Electronically Signed   By: Genevie Ann M.D.   On: 07/07/2020 11:29   CT Angio Chest Pulmonary Embolism (PE) W or WO Contrast  Result Date: 07/09/2020 CLINICAL DATA:  Tachypnea, hypoxia, fever. EXAM: CT ANGIOGRAPHY CHEST CT ABDOMEN AND PELVIS WITH CONTRAST TECHNIQUE: Multidetector CT imaging of the chest was performed using the standard protocol during bolus administration of intravenous contrast. Multiplanar CT image reconstructions and MIPs were obtained to evaluate the vascular anatomy. Multidetector CT imaging of the abdomen and pelvis was performed using the standard protocol during bolus administration of intravenous contrast. CONTRAST:  161m OMNIPAQUE IOHEXOL 350 MG/ML SOLN COMPARISON:  CT chest dated 06/23/2020. FINDINGS: CTA CHEST FINDINGS Cardiovascular: Satisfactory opacification of the pulmonary arteries to the segmental level. No evidence of pulmonary embolism. Vascular calcifications are seen in the aortic arch. Normal heart size. No pericardial effusion. A right internal jugular central venous catheter terminates in the superior vena cava. Mediastinum/Nodes: No enlarged mediastinal, hilar, or axillary lymph nodes. Aspirated debris is seen in the trachea and bilateral lower lobe bronchi. Thyroid gland and esophagus demonstrate no significant findings. An enteric tube terminates in the stomach. Lungs/Pleura: There is atelectatic collapse of the left lower lobe. There is mild right basilar atelectasis. Moderate to severe centrilobular and paraseptal emphysema is noted. There is  no pneumothorax or pleural effusion. Musculoskeletal: Anterior wedge deformities of T7 and T8 are unchanged since 06/20/2020. Review of the MIP images confirms the above findings. CT ABDOMEN and PELVIS FINDINGS  Hepatobiliary: No focal liver abnormality is seen. No gallstones, gallbladder wall thickening, or biliary dilatation. Pancreas: Unremarkable. No pancreatic ductal dilatation or surrounding inflammatory changes. Spleen: Normal in size without focal abnormality. Adrenals/Urinary Tract: Adrenal glands are unremarkable. Kidneys are normal, without renal calculi, focal lesion, or hydronephrosis. Gas in the urinary bladder may represent recent instrumentation. Stomach/Bowel: Stomach is within normal limits. There is a large amount of stool in the rectum which may reflect impaction. There is edema located posterior to the rectum and anterior to the sacrum in the presacral space. Appendix appears normal. No evidence of bowel wall thickening, distention, or inflammatory changes. Vascular/Lymphatic: Aortic atherosclerosis. No enlarged abdominal or pelvic lymph nodes. Reproductive: The bilateral adnexa are unremarkable. The uterus is either atrophic or absent. Other: No abdominal wall hernia. Musculoskeletal: Degenerative changes are seen in the spine, most severe at L5-S1. Review of the MIP images confirms the above findings. IMPRESSION: 1.  No evidence of pulmonary embolism. 2. Aspirated debris in the trachea and bilateral lower lobe bronchi with atelectatic collapse of the left lower lobe. 3. Large stool burden in the rectum with edema in the presacral space may reflect proctitis. Aortic Atherosclerosis (ICD10-I70.0) and Emphysema (ICD10-J43.9). Electronically Signed   By: Zerita Boers M.D.   On: 07/09/2020 17:11   CT Cervical Spine Wo Contrast  Result Date: 07/04/2020 CLINICAL DATA:  Head trauma.  Fall several days ago. EXAM: CT HEAD WITHOUT CONTRAST CT CERVICAL SPINE WITHOUT CONTRAST TECHNIQUE: Multidetector CT imaging of the head and cervical spine was performed following the standard protocol without intravenous contrast. Multiplanar CT image reconstructions of the cervical spine were also generated. COMPARISON:   04/11/2014 FINDINGS: CT HEAD FINDINGS Brain: No evidence of acute infarction, hemorrhage, hydrocephalus, extra-axial collection or mass lesion/mass effect. Vascular: No hyperdense vessel or unexpected calcification. Skull: Normal. Negative for fracture or focal lesion. Sinuses/Orbits: Opacification of the maxillary sinuses, ethmoid air cells noted. Sphenoid sinus mucosal thickening. Mastoid air cells are clear. Other: None CT CERVICAL SPINE FINDINGS Alignment: 4 mm anterolisthesis of L3 on L4 is identified, image 28/6. Skull base and vertebrae: No acute fracture. No primary bone lesion or focal pathologic process. Soft tissues and spinal canal: No prevertebral fluid or swelling. No visible canal hematoma. Disc levels: Marked multi level spondylosis identified throughout the cervical spine. Most advanced at C3-4, C4-5, C5-6 and C6-7. Bilateral facet arthropathy is also noted. Upper chest: Large right pleural effusion identified with opacification of the right upper lobe. Other: None IMPRESSION: 1. No acute intracranial abnormalities. 2. Chronic sinus inflammation. 3. No evidence for cervical spine fracture. 4. Advanced cervical spondylosis. 5. Large right pleural effusion with opacification of the right upper lobe. Electronically Signed   By: Kerby Moors M.D.   On: 06/26/2020 13:26   MR BRAIN WO CONTRAST  Result Date: 07/08/2020 CLINICAL DATA:  Mental status change. EXAM: MRI HEAD WITHOUT CONTRAST TECHNIQUE: Multiplanar, multiecho pulse sequences of the brain and surrounding structures were obtained without intravenous contrast. COMPARISON:  Head CT 07/07/2020 FINDINGS: Some sequences are moderately to severely motion degraded despite use of faster, more motion resistant imaging protocols and attempts at repeat imaging. Brain: There is no evidence of an acute infarct, intracranial hemorrhage, mass, midline shift, or extra-axial fluid collection. The ventricles and sulci are within normal limits for age. A  subcentimeter T2  hyperintensity at the posterior aspect of the right lentiform nucleus may reflect a chronic lacunar infarct or dilated perivascular space. No significant white matter disease is evident elsewhere for age within limitations of motion. Vascular: Major intracranial vascular flow voids are preserved. Skull and upper cervical spine: No gross skull lesion. Sinuses/Orbits: Grossly unremarkable orbits. Subtotal opacification of the maxillary sinuses by combination of fluid and mucosal thickening. Moderate volume fluid in the left sphenoid sinus. Mild mucosal thickening in the frontal and ethmoid sinuses. Small left mastoid effusion. Other: 1.1 cm T2 hypointense nodule along the posterior margin of the right parotid tail. IMPRESSION: 1. No acute intracranial abnormality identified on this motion degraded study. 2. Sinusitis. 3. 1.1 cm nodule along the right parotid tail, indeterminate though may reflect a lymph node or small parotid neoplasm. Electronically Signed   By: Logan Bores M.D.   On: 07/08/2020 14:19   CT ABDOMEN PELVIS W CONTRAST  Result Date: 07/09/2020 CLINICAL DATA:  Tachypnea, hypoxia, fever. EXAM: CT ANGIOGRAPHY CHEST CT ABDOMEN AND PELVIS WITH CONTRAST TECHNIQUE: Multidetector CT imaging of the chest was performed using the standard protocol during bolus administration of intravenous contrast. Multiplanar CT image reconstructions and MIPs were obtained to evaluate the vascular anatomy. Multidetector CT imaging of the abdomen and pelvis was performed using the standard protocol during bolus administration of intravenous contrast. CONTRAST:  147m OMNIPAQUE IOHEXOL 350 MG/ML SOLN COMPARISON:  CT chest dated 06/26/2020. FINDINGS: CTA CHEST FINDINGS Cardiovascular: Satisfactory opacification of the pulmonary arteries to the segmental level. No evidence of pulmonary embolism. Vascular calcifications are seen in the aortic arch. Normal heart size. No pericardial effusion. A right internal  jugular central venous catheter terminates in the superior vena cava. Mediastinum/Nodes: No enlarged mediastinal, hilar, or axillary lymph nodes. Aspirated debris is seen in the trachea and bilateral lower lobe bronchi. Thyroid gland and esophagus demonstrate no significant findings. An enteric tube terminates in the stomach. Lungs/Pleura: There is atelectatic collapse of the left lower lobe. There is mild right basilar atelectasis. Moderate to severe centrilobular and paraseptal emphysema is noted. There is no pneumothorax or pleural effusion. Musculoskeletal: Anterior wedge deformities of T7 and T8 are unchanged since 06/22/2020. Review of the MIP images confirms the above findings. CT ABDOMEN and PELVIS FINDINGS Hepatobiliary: No focal liver abnormality is seen. No gallstones, gallbladder wall thickening, or biliary dilatation. Pancreas: Unremarkable. No pancreatic ductal dilatation or surrounding inflammatory changes. Spleen: Normal in size without focal abnormality. Adrenals/Urinary Tract: Adrenal glands are unremarkable. Kidneys are normal, without renal calculi, focal lesion, or hydronephrosis. Gas in the urinary bladder may represent recent instrumentation. Stomach/Bowel: Stomach is within normal limits. There is a large amount of stool in the rectum which may reflect impaction. There is edema located posterior to the rectum and anterior to the sacrum in the presacral space. Appendix appears normal. No evidence of bowel wall thickening, distention, or inflammatory changes. Vascular/Lymphatic: Aortic atherosclerosis. No enlarged abdominal or pelvic lymph nodes. Reproductive: The bilateral adnexa are unremarkable. The uterus is either atrophic or absent. Other: No abdominal wall hernia. Musculoskeletal: Degenerative changes are seen in the spine, most severe at L5-S1. Review of the MIP images confirms the above findings. IMPRESSION: 1.  No evidence of pulmonary embolism. 2. Aspirated debris in the trachea and  bilateral lower lobe bronchi with atelectatic collapse of the left lower lobe. 3. Large stool burden in the rectum with edema in the presacral space may reflect proctitis. Aortic Atherosclerosis (ICD10-I70.0) and Emphysema (ICD10-J43.9). Electronically Signed   By: TDorothea Ogle  Litton M.D.   On: 07/09/2020 17:11   CT CHEST ABDOMEN PELVIS W CONTRAST  Result Date: 06/29/2020 CLINICAL DATA:  Fall several days ago EXAM: CT CHEST, ABDOMEN, AND PELVIS WITH CONTRAST TECHNIQUE: Multidetector CT imaging of the chest, abdomen and pelvis was performed following the standard protocol during bolus administration of intravenous contrast. CONTRAST:  140m OMNIPAQUE IOHEXOL 300 MG/ML  SOLN COMPARISON:  None. FINDINGS: CT CHEST FINDINGS Cardiovascular: Aortic atherosclerosis. Normal heart size. Three-vessel coronary artery calcifications. No pericardial effusion. Mediastinum/Nodes: No enlarged mediastinal, hilar, or axillary lymph nodes. Thyroid gland, trachea, and esophagus demonstrate no significant findings. Lungs/Pleura: Moderate to severe centrilobular emphysema. Large right pleural effusion and associated atelectasis or consolidation. No displaced rib fracture or other evident etiology. Musculoskeletal: No chest wall mass. There are wedge deformities of the T7 and T8 vertebral bodies, proximally 30% anterior height loss, which are new compared to most recent imaging of the chest dated 09/29/2014 but age indeterminate (series 7, image 138). CT ABDOMEN PELVIS FINDINGS Hepatobiliary: No solid liver abnormality is seen. No gallstones, gallbladder wall thickening, or biliary dilatation. Pancreas: Unremarkable. No pancreatic ductal dilatation or surrounding inflammatory changes. Spleen: Normal in size without significant abnormality. Adrenals/Urinary Tract: Adrenal glands are unremarkable. Kidneys are normal, without renal calculi, solid lesion, or hydronephrosis. Foley catheter in the urinary bladder. Stomach/Bowel: Stomach is within  normal limits. Esophagogastric tube tip and side port below the diaphragm. Appendix appears normal. No evidence of bowel wall thickening, distention, or inflammatory changes. Large burden of stool throughout the colon. Vascular/Lymphatic: Aortic atherosclerosis. No enlarged abdominal or pelvic lymph nodes. Reproductive: No mass or other abnormality. Other: No abdominal wall hernia or abnormality. No abdominopelvic ascites. Musculoskeletal: No acute or significant osseous findings. IMPRESSION: 1. No definite CT evidence of acute traumatic injury to the chest, abdomen, or pelvis. 2. Large right pleural effusion and associated atelectasis or consolidation. No displaced rib fracture or other evident etiology. 3. There are wedge deformities of the T7 and T8 vertebral bodies, proximally 30% anterior height loss, which are new compared to most recent imaging of the chest dated 09/29/2014 but age indeterminate. Correlate for acute point tenderness. 4. Emphysema. 5. Coronary artery disease. Aortic Atherosclerosis (ICD10-I70.0) and Emphysema (ICD10-J43.9). Electronically Signed   By: AEddie CandleM.D.   On: 07/01/2020 13:26   DG Chest Port 1 View  Result Date: 07/11/2020 CLINICAL DATA:  Acute respiratory failure. EXAM: PORTABLE CHEST 1 VIEW COMPARISON:  07/10/2020 FINDINGS: 0316 hours. Endotracheal tube tip is 5 cm above the base of the carina. Right IJ central line remains in place. The NG tube passes into the stomach although the distal tip position is not included on the film. No pneumothorax or pleural effusion. Basilar atelectasis again noted without substantial pulmonary edema. Telemetry leads overlie the chest. IMPRESSION: Stable exam. Bibasilar atelectasis. Electronically Signed   By: EMisty StanleyM.D.   On: 07/11/2020 07:47   DG Chest Port 1 View  Result Date: 07/10/2020 CLINICAL DATA:  Respiratory distress. EXAM: PORTABLE CHEST 1 VIEW COMPARISON:  Radiograph earlier today, chest CT yesterday. FINDINGS:  Endotracheal tube tip is 6.4 cm from the carina. Enteric tube tip below the diaphragm in the stomach. Right internal jugular central venous catheter tip overlies the lower SVC. Improved aeration of the left lower lobe with likely re-expansion, mild residual atelectasis. Similar right lung base atelectasis. Stable heart size and mediastinal contours. No pneumothorax. Minimal blunting of the costophrenic angles may represent small effusions. IMPRESSION: 1. Improved aeration of the left lower lobe with  likely re-expansion, mild residual atelectasis. Similar right lung base atelectasis. 2. Possible small pleural effusions. 3. Endotracheal tube tip is 6.4 cm from the carina. Enteric tube and right central line remain in place Electronically Signed   By: Keith Rake M.D.   On: 07/10/2020 15:55   DG Chest Port 1 View  Result Date: 07/09/2020 CLINICAL DATA:  Acute respiratory failure EXAM: PORTABLE CHEST 1 VIEW COMPARISON:  07/08/2020 FINDINGS: There is left lower lobe collapse with are lucency of the left hemithorax secondary to hyperinflation of the left upper lobe. Resultant left-sided volume loss. Coarsening of the interstitium within the lung apices is in keeping with moderate emphysema better appreciated on prior CT examination of 07/07/2020. No pneumothorax or pleural effusion. Cardiac size within normal limits. Right internal jugular central venous catheter noted within the superior vena cava. Degenerative changes are seen within the left shoulder. IMPRESSION: Interval left lower lobe collapse. Moderate emphysema. Electronically Signed   By: Fidela Salisbury MD   On: 07/09/2020 04:51   DG Chest Port 1 View  Result Date: 07/08/2020 CLINICAL DATA:  Acute respiratory failure EXAM: PORTABLE CHEST 1 VIEW COMPARISON:  07/06/2020 FINDINGS: Endotracheal tube and nasogastric tube have been removed. Pulmonary insufflation is stable. Minimal left basilar atelectasis. Previously noted left pleural effusion has  resolved. No pneumothorax or pleural effusion. Right internal jugular central venous catheter is unchanged with its tip at the superior vena cava. Cardiac size within normal limits. Pulmonary vascularity is normal. IMPRESSION: Interval extubation with stable pulmonary hypoinflation. Resolved left pleural effusion. Electronically Signed   By: Fidela Salisbury MD   On: 07/08/2020 03:56   DG Chest Port 1 View  Result Date: 07/05/2020 CLINICAL DATA:  Respiratory distress respiratory failure EXAM: PORTABLE CHEST 1 VIEW COMPARISON:  07/03/2020 FINDINGS: No significant change in AP portable chest radiograph, with right-sided pigtail chest tube. No pneumothorax or acute airspace opacity. Support apparatus including endotracheal tube, esophagogastric tube, and right neck vascular catheter in appropriate position. IMPRESSION: No significant change in AP portable chest radiograph, with right-sided pigtail chest tube. No pneumothorax or acute airspace opacity. Electronically Signed   By: Eddie Candle M.D.   On: 07/05/2020 11:01   DG Chest Port 1 View  Result Date: 07/03/2020 CLINICAL DATA:  Respiratory failure EXAM: PORTABLE CHEST 1 VIEW COMPARISON:  06/11/2020 FINDINGS: Endotracheal tube is seen 4.7 cm above the carina. Nasogastric tube tip overlies the proximal body of the stomach. Right internal jugular central venous catheter tip overlies the superior vena cava. Right basilar pigtail chest tube is unchanged. Pulmonary insufflation is stable. Bibasilar pulmonary infiltrate has improved. Stable partial right lower lobe collapse. No pneumothorax or pleural effusion. Cardiac size within normal limits. Pulmonary vascularity is normal. IMPRESSION: Stable support lines and tubes. Improving bibasilar pulmonary infiltrate. Right chest tube in place.  No pneumothorax. Stable partial right lower lobe collapse. Electronically Signed   By: Fidela Salisbury MD   On: 07/03/2020 02:36   DG Chest Port 1 View  Result Date:  06/26/2020 CLINICAL DATA:  Chest tube NG tube EXAM: PORTABLE CHEST 1 VIEW COMPARISON:  07/07/2020 FINDINGS: Endotracheal tube tip is about 6 cm superior to the carina. Esophageal tube tip overlies the proximal stomach, side-port in the region of the distal esophagus. Right IJ central venous catheter tip over the SVC. Interim placement of right-sided drainage catheter over the lower chest with decreased right pleural effusion. Small residual right pleural effusion without visible pneumothorax. Persistent airspace disease at the right middle lobe and right base.  IMPRESSION: 1. Endotracheal tube tip about 6 cm superior to carina 2. Esophageal tube tip with side-port in the region of distal esophagus, consider further advancement for more optimal positioning 3. Interim placement of right-sided chest tube with decreased right pleural effusion. Small residual right pleural effusion with airspace disease in the right mid lung and consolidation at the right middle lobe and right base. Electronically Signed   By: Donavan Foil M.D.   On: 06/16/2020 18:45   DG Chest Portable 1 View  Result Date: 06/24/2020 CLINICAL DATA:  Status post intubation. EXAM: PORTABLE CHEST 1 VIEW COMPARISON:  09/29/2014 FINDINGS: ETT tip above the carina. There is an NG tube with tip below the GE junction. Moderate to large right pleural effusion. Atelectasis and airspace opacification of the right lower lobe and right middle lobes noted. Diffuse pulmonary vascular congestion noted. The visualized osseous structures are unremarkable. IMPRESSION: 1. Support apparatus positioned as above. 2. Moderate to large right pleural effusion with associated atelectasis and airspace opacification of the right lower lobe and right middle lobes. 3. Pulmonary vascular congestion. Electronically Signed   By: Kerby Moors M.D.   On: 06/28/2020 11:03   EEG adult  Result Date: 07/10/2020 Lora Havens, MD     07/10/2020  4:25 PM Patient Name: ZAYANA SALVADOR  MRN: [id number] Epilepsy Attending: Lora Havens Referring Physician/Provider: Darel Hong, NP Date: 07/10/2020 Duration: 29.53 mins Patient history: 65 year old female with altered mental status.  EEG to evaluate for seizures. Level of alertness: comatose AEDs during EEG study: Versed, Xanax Technical aspects: This EEG study was done with scalp electrodes positioned according to the 10-20 International system of electrode placement. Electrical activity was acquired at a sampling rate of 500Hz  and reviewed with a high frequency filter of 70Hz  and a low frequency filter of 1Hz . EEG data were recorded continuously and digitally stored. Description: EEG showed continuous generalized 5 to 7 Hz theta as well as intermittent 2-3Hz  delta slowing. Hyperventilation and photic stimulation were not performed.   ABNORMALITY - Continuous slow, generalized IMPRESSION: This study is suggestive of moderate to severe diffuse encephalopathy, nonspecific etiology. No seizures or epileptiform discharges were seen throughout the recording. Lora Havens   ECHOCARDIOGRAM COMPLETE  Result Date: 06/26/2020    ECHOCARDIOGRAM REPORT   Patient Name:   MAKINZEY BANES Date of Exam: 07/01/2020 Medical Rec #:  [id number]      Height:       65.0 in Accession #:    [id number]     Weight:       154.5 lb Date of Birth:  [birth year]      BSA:          1.773 m Patient Age:    89 years       BP:           88/64 mmHg Patient Gender: F              HR:           107 bpm. Exam Location:  [facility] Procedure: 2D Echo, Color Doppler, Cardiac Doppler and Intracardiac            Opacification Agent Indications:     Acute respiratory distress R06.03  History:         Patient has no prior history of Echocardiogram examinations.                  COPD; Risk Factors:Hypertension. Panic disorder.  Sonographer:     Sonia Side  Hege RDCS (AE) Referring Phys:  [id number] [facility] Diagnosing Phys: Ida Rogue MD  Sonographer Comments: Echo performed with patient  supine and on artificial respirator. IMPRESSIONS  1. Challenging images  2. Left ventricular ejection fraction, by estimation, is 60 to 65%. The left ventricle has normal function. The left ventricle has no regional wall motion abnormalities. Left ventricular diastolic parameters are consistent with Grade I diastolic dysfunction (impaired relaxation).  3. Right ventricular systolic function is normal. The right ventricular size is normal. There is normal pulmonary artery systolic pressure. The estimated right ventricular systolic pressure is 68.1 mmHg.  4. The mitral valve was not well visualized. No evidence of mitral valve regurgitation. No evidence of mitral stenosis. Moderate mitral annular calcification. FINDINGS  Left Ventricle: Left ventricular ejection fraction, by estimation, is 60 to 65%. The left ventricle has normal function. The left ventricle has no regional wall motion abnormalities. Definity contrast agent was given IV to delineate the left ventricular  endocardial borders. The left ventricular internal cavity size was normal in size. There is no left ventricular hypertrophy. Left ventricular diastolic parameters are consistent with Grade I diastolic dysfunction (impaired relaxation). Right Ventricle: The right ventricular size is normal. No increase in right ventricular wall thickness. Right ventricular systolic function is normal. There is normal pulmonary artery systolic pressure. The tricuspid regurgitant velocity is 2.23 m/s, and  with an assumed right atrial pressure of 5 mmHg, the estimated right ventricular systolic pressure is 15.7 mmHg. Left Atrium: Left atrial size was not well visualized. Right Atrium: Right atrial size was not well visualized. Pericardium: There is no evidence of pericardial effusion. Mitral Valve: The mitral valve was not well visualized. Moderate mitral annular calcification. No evidence of mitral valve regurgitation. No evidence of mitral valve stenosis. Tricuspid  Valve: The tricuspid valve is not well visualized. Tricuspid valve regurgitation is mild . No evidence of tricuspid stenosis. Aortic Valve: The aortic valve was not well visualized. Aortic valve regurgitation is not visualized. No aortic stenosis is present. Aortic valve mean gradient measures 4.0 mmHg. Aortic valve peak gradient measures 6.7 mmHg. Aortic valve area, by VTI measures 2.74 cm. Pulmonic Valve: The pulmonic valve was normal in structure. Pulmonic valve regurgitation is not visualized. No evidence of pulmonic stenosis. Aorta: The aortic root is normal in size and structure. Venous: The pulmonary veins were not well visualized. The inferior vena cava is normal in size with greater than 50% respiratory variability, suggesting right atrial pressure of 3 mmHg. IAS/Shunts: No atrial level shunt detected by color flow Doppler.  LEFT VENTRICLE PLAX 2D LVIDd:         3.30 cm  Diastology LVIDs:         1.61 cm  LV e' medial:    4.03 cm/s LV PW:         1.66 cm  LV E/e' medial:  19.8 LV IVS:        0.88 cm  LV e' lateral:   4.03 cm/s LVOT diam:     2.00 cm  LV E/e' lateral: 19.8 LV SV:         81 LV SV Index:   46 LVOT Area:     3.14 cm  RIGHT VENTRICLE RV Basal diam:  3.35 cm RV S prime:     16.80 cm/s TAPSE (M-mode): 4.7 cm LEFT ATRIUM           Index       RIGHT ATRIUM  Index LA diam:      2.50 cm 1.41 cm/m  RA Area:     12.90 cm LA Vol (A2C): 40.0 ml 22.56 ml/m RA Volume:   29.90 ml  16.87 ml/m LA Vol (A4C): 63.6 ml 35.88 ml/m  AORTIC VALVE                   PULMONIC VALVE AV Area (Vmax):    2.84 cm    PV Vmax:        0.33 m/s AV Area (Vmean):   2.80 cm    PV Peak grad:   0.4 mmHg AV Area (VTI):     2.74 cm    RVOT Peak grad: 2 mmHg AV Vmax:           129.50 cm/s AV Vmean:          91.050 cm/s AV VTI:            0.298 m AV Peak Grad:      6.7 mmHg AV Mean Grad:      4.0 mmHg LVOT Vmax:         117.00 cm/s LVOT Vmean:        81.200 cm/s LVOT VTI:          0.259 m LVOT/AV VTI ratio: 0.87   AORTA Ao Root diam: 3.10 cm MITRAL VALVE                TRICUSPID VALVE MV Area (PHT): 2.62 cm     TR Peak grad:   19.9 mmHg MV Decel Time: 290 msec     TR Vmax:        223.00 cm/s MV E velocity: 79.70 cm/s MV A velocity: 107.00 cm/s  SHUNTS MV E/A ratio:  0.74         Systemic VTI:  0.26 m                             Systemic Diam: 2.00 cm Ida Rogue MD Electronically signed by Ida Rogue MD Signature Date/Time: 06/30/2020/5:21:34 PM    Final     Assessment and plan-   # Persistent leukocytosis, Pathology smear showed normal morphology of RBC, WBC and platelet. Likely leukemoid reaction secondary to pneumonia/respiratory failure/recent IV steroid use-[discontinued] Leukocytosis has improved.  #Anemia, hemoglobin 6.7 today.  Recommend to transfuse PRBC to keep hemoglobin above 7.   No obvious signs of blood loss.  Check vitamin B12, folate, iron panel, reticulocyte panel.  #Encephalopathy, questionable alcohol withdrawal syndrome.Patient is intubated and sedated.  Neurology on board.  Thank you for allowing me to participate in the care of this patient.   Earlie Server, MD, PhD Hematology Oncology [facility] Pager- [phone] 07/13/2020

## 2020-07-13 NOTE — Progress Notes (Signed)
NAME:  Tamara Mcclure, MRN:  [id number], DOB:  13-Mar-1955, LOS: 10 ADMISSION DATE:  07/09/2020, CONSULTATION DATE: 06/26/2020 REFERRING MD:  Lindell Noe MD, CHIEF COMPLAINT:   Acute respiratory failure, pneumonia  History of Present Illness:   65 year old with history of COPD, depression, hypertension, panic attacks presenting with altered mental status, acute on chronic respiratory failure.  Patient had a fall a few days ago, seen at local urgent care.  Has been taking increasing doses of opiate.  In the ED she failed Narcan and BiPAP and got intubated.  PCCM consulted for admission  Pertinent  Medical History    has a past medical history of Anxiety, Chronic lower back pain, COPD (chronic obstructive pulmonary disease) ([place]), Depression, Hypertension, Panic disorder with agoraphobia and severe panic attacks, and Peripheral sensory neuropathy.   Micro Data:  06/13/2020: SARS-CoV-2 and influenza PCR>> negative 06/15/2020: HIV>> nonreactive 06/22/2020: Strep pneumo urinary antigen>> negative 07/04/2020: Legionella urinary antigen>>negative 06/27/2020: Pleural fluid>> no growth 07/06/2020: Blood culture>> no growth 06/24/2020: MRSA PCR>> positive 06/11/2020: Tracheal aspirate>> Pseudomonas Aeruginosa & MRSA 06/15/2020: Urine>> no growth 07/10/2020: Stool culture (GI panel)>> no growth 07/10/2020: C. difficile PCR: Negative  Antimicrobials:  Cefepime 6/23>> 7/1 Ceftriaxone 6/24>> 6/25 Flagyl 6/23>> 6/27 Vancomycin 6/23>> 6/27; re-started 7/1>> Linezolid 6/27>>7/1 PO Vancomycin 6/30>>7/02 Ceftazidime 7/1>>  Significant Hospital Events: Including procedures, antibiotic start and stop dates in addition to other pertinent events   6/23-admit, intubated, right chest tube placed, right IJ CVC placed 6/24: Weaning vent settings and Levophed (down to 8 mcg). Na+ corrected to 124 this morning (108 on admission) ~ D5 @ 75 started. Tracheal aspirate with Gram+ cocci in pairs & rare gram + rods ~  Cefepime changed to Ceftriaxone, Azithromycin d/c.  Pleural fluid consistent with EXUDATE ~ pleural fluid culture pending 6/26 remains on vent, critically ill 6/27 Successfully extubated  6/28: Chest tube removed, remains with AMS, CT Head negative 6/29: Remains altered, concern for ? Catatonia vs. ? Osmotic Demyelination Syndrome (serum Na+ noted to have rapidly corrected during 1st 24 hrs of admission), Obtain MRI Brain,  Psych consulted 6/30: MRI Brain yesterday normal, tracking today (intermittently follows commands), obtain Neurology consult. WBC increased to 51 (23.6) ~ Peripheral Smear Normal, steroids d/c, consider Hematology consult; get CTA Chest and CT Abdomen/Pelvis 7/1: Overnight with hypoxia and respiratory distress, required intubation.  ABX changed to Ceftazidime and Vancomycin.  Hematology consulted for severe Leukocytosis.  EEG pending 7/2: Remains vent dependent, FiO2 down to 55%.  Chest x-ray improved with recruitment maneuvers 7/3: No ventilator asynchrony, heavily sedated, IV sedatives being titrated off as tolerated, not ready for SBT  Scheduled Meds:  sodium chloride   Intravenous Once   ALPRAZolam  1 mg Per Tube TID   budesonide (PULMICORT) nebulizer solution  0.5 mg Nebulization BID   chlorhexidine gluconate (MEDLINE KIT)  15 mL Mouth Rinse BID   Chlorhexidine Gluconate Cloth  6 each Topical Daily   feeding supplement (PROSource TF)  45 mL Per Tube BID   free water  200 mL Per Tube Q4H   heparin  5,000 Units Subcutaneous Q8H   ipratropium-albuterol  3 mL Nebulization Q4H   mouth rinse  15 mL Mouth Rinse 10 times per day   midodrine  10 mg Per Tube TID WC   multivitamin  15 mL Oral Daily   pantoprazole (PROTONIX) IV  40 mg Intravenous QHS   senna  2 tablet Oral QHS   thiamine injection  100 mg Intravenous Q24H   Continuous  Infusions:  cefTAZidime (FORTAZ)  IV 2 g (07/13/20 0228)   dexmedetomidine (PRECEDEX) IV infusion 1.2 mcg/kg/hr (07/13/20 0459)   feeding  supplement (OSMOLITE 1.5 CAL) 1,000 mL (07/13/20 0039)   fentaNYL infusion INTRAVENOUS 150 mcg/hr (07/13/20 0314)   midazolam 6 mg/hr (07/13/20 0312)   norepinephrine (LEVOPHED) Adult infusion Stopped (07/12/20 1927)   vancomycin Stopped (07/12/20 1653)   vasopressin 0.02 Units/min (07/13/20 0623)   PRN Meds:.acetaminophen, acetaminophen, fentaNYL (SUBLIMAZE) injection, fentaNYL (SUBLIMAZE) injection, ipratropium-albuterol, midazolam, sodium phosphate   Interim History / Subjective:   Remains critically ill +MRSA AND PSEUDOMONAL PNEUMONIA Severe COPD Severe resp failure Severe hypoxia  Objective   Blood pressure 95/61, pulse 74, temperature 98.96 F (37.2 C), resp. rate 11, height 5' 5"  (1.651 m), weight 65.8 kg, SpO2 100 %. CVP:  [5 mmHg-19 mmHg] 5 mmHg  Vent Mode: PRVC FiO2 (%):  [55 %] 55 % Set Rate:  [16 bmp] 16 bmp Vt Set:  [400 mL] 400 mL PEEP:  [10 cmH20] 10 cmH20 Plateau Pressure:  [14 cmH20-21 cmH20] 21 cmH20   Intake/Output Summary (Last 24 hours) at 07/13/2020 0717 Last data filed at 07/12/2020 2321 Gross per 24 hour  Intake 1884.67 ml  Output 1365 ml  Net 519.67 ml    Filed Weights   07/10/20 0500 07/11/20 0423 07/12/20 0500  Weight: 59.6 kg 62.1 kg 65.8 kg    REVIEW OF SYSTEMS  PATIENT IS UNABLE TO PROVIDE COMPLETE REVIEW OF SYSTEMS DUE TO SEVERE CRITICAL ILLNESS AND TOXIC METABOLIC ENCEPHALOPATHY   PHYSICAL EXAMINATION:  GENERAL:critically ill appearing, +resp distress HEAD: Normocephalic, atraumatic.  EYES: Pupils equal, round, reactive to light.  No scleral icterus.  MOUTH: Moist mucosal membrane. NECK: Supple. No thyromegaly. No nodules. No JVD.  PULMONARY: +rhonchi, +wheezing CARDIOVASCULAR: S1 and S2. Regular rate and rhythm. No murmurs, rubs, or gallops.  GASTROINTESTINAL: Soft, nontender, -distended. Positive bowel sounds.  MUSCULOSKELETAL: No swelling, clubbing, or edema.  NEUROLOGIC: obtunded SKIN:intact,warm,dry   Labs/imaging that I  havepersonally reviewed  (right click and "Reselect all SmartList Selections" daily)   Labs 07/09/2020: glucose 160, BUN 76, Cr. 2.07, PCT 1.18, WBC 88.9 (with Neutrophilia and mild left shift), Platelets 411 ABG: pH 7.39/pCO2 41/pO2 67/Bicarb 24.8  CTA Chest 6/30>>1.  No evidence of pulmonary embolism. 2. Aspirated debris in the trachea and bilateral lower lobe bronchi with atelectatic collapse of the left lower lobe. CT Abdomen/Pelvis w/ contrast 6/30>> Large stool burden in the rectum with edema in the presacral space may reflect proctitis. MRI Brain 6/29>>1. No acute intracranial abnormality identified on this motion degraded study. 2. Sinusitis. 3. 1.1 cm nodule along the right parotid tail, indeterminate though may reflect a lymph node or small parotid neoplasm. CT Head 6/28>>No acute abnormality. No evidence of cerebral edema or infarction. No intracranial hemorrhage CT Chest w/o contrast 6/28>>IMPRESSION: 1. Largely resolved right pleural effusion with pleural drain in place. Mild or trace residual fluid and/or thickening in the right costophrenic angle. 2. Small volume retained secretions in the airways. No convincing pneumonia. Emphysema (ICD10-J43.9). 3. Aortic Atherosclerosis (ICD10-I70.0). CT head 6/23>>1. No acute intracranial abnormalities. 2. Chronic sinus inflammation. CT cervical spine 6/23>>No evidence for cervical spine fracture.  Advanced cervical spondylosis CT abdomen pelvis 6/23>>1. No definite CT evidence of acute traumatic injury to the chest, abdomen, or pelvis. 2. Large right pleural effusion and associated atelectasis or consolidation. No displaced rib fracture or other evident etiology. 3. There are wedge deformities of the T7 and T8 vertebral bodies, proximally 30% anterior height loss, which  are new compared to most recent imaging of the chest dated 09/29/2014 but age indeterminate. Correlate for acute point tenderness. 4. Emphysema. 5. Coronary  artery disease.  Resolved Hospital Problem list   Atelectasis left base resolved - Chest x-ray 07/02:   Assessment & Plan:   Severe ACUTE Hypoxic and Hypercapnic Respiratory Failure Acute on chronic respiratory failure due to COPD exacerbation, right lower lobe pneumonia (Pseudomonas & MRSA), & large Right Pleural Effusion -continue Mechanical Ventilator support -continue Bronchodilator Therapy -Wean Fio2 and PEEP as tolerated -VAP/VENT bundle implementation -will perform SAT/SBT when respiratory parameters are met   SEVERE COPD EXACERBATION S/p IV steroids  -continue NEB THERAPY as prescribed -morphine as needed -wean fio2 as needed and tolerated   INFECTIOUS DISEASE -continue antibiotics as prescribed -follow up cultures -follow up ID consultation -ABX changed to Ceftazidime and Vancomycin due to potential with encephalopathy with linezolid/cefepime (give ABX total 14 days treatment) -Pleural fluid culture with no growth ~ consistent with PARAPNEUMONIC Effusion, resolved.  Hypovolemic shock, resolving Septic shock, resolved -Continuous cardiac monitoring -Maintain MAP greater than 65 -Vasopressors as needed to maintain MAP goal -Vasopressors being weaned off -Add Midodrine 10 mg TID -Volume resuscitate by CVP -Serum cortisol = 34.8 7/1 -Echocardiogram on 06/26/2020 with LVEF 60 to 09%, grade 1 diastolic dysfunction, RV systolic function normal  Sepsis present on admission secondary to Pseudomonas & MRSA Pneumonia Severe Leukocytosis Follow up CBC Follow up cultures as needed   ACUTE KIDNEY INJURY/Renal Failure -continue Foley Catheter-assess need -Avoid nephrotoxic agents -Follow urine output, BMP -Ensure adequate renal perfusion, optimize oxygenation -Renal dose medications    NEUROLOGY ACUTE TOXIC METABOLIC ENCEPHALOPATHY -need for sedation -Goal RASS -2 to -3 -CT head 07/07/2020 & MRI Brain 07/08/20 both negative for acute intracranial  abnormality -Psychiatry and Neurology following, appreciate input -EEG pending  Severe protein calorie malnutrition Nutrition Status: Nutrition Problem: Inadequate oral intake Etiology: inability to eat (ventilated) Signs/Symptoms: NPO status Interventions: Refer to RD note for recommendations patient started on tube feeds Protein supplementation    Best Practice (right click and "Reselect all SmartList Selections" daily)   Diet/type: Begin tube feeds 7/2 Pain/Anxiety/Delirium protocol yes VAP protocol (if indicated): yes DVT prophylaxis: prophylactic heparin  GI prophylaxis: PPI Glucose control:  yes, SSI Central venous access:  yes, and is still indicated Arterial line:  N/A Foley:  N/A Mobility:  bed rest  PT consulted: N/A Studies pending: EEG Culture data pending: N/A Last reviewed culture data:today Antibiotics: Ceftazidime, Vancomycin, p.o. vancomycin DC'd 7/2 Flagyl DC'd 7/2 Antibiotic de-escalation:  N/A, on appropriate ABX Stop date: to be determined enthesis 14 days total) Daily labs: requested Code Status:  full code Last date of multidisciplinary goals of care discussion [07/10/20] Disposition: remains critically ill, will stay in intensive care  Labs   CBC: Recent Labs  Lab 07/09/20 0850 07/10/20 0439 07/11/20 0423 07/12/20 0504 07/12/20 1416 07/13/20 0532  WBC 51.0* 88.9* 82.0* 42.8*  --  18.5*  NEUTROABS 43.0* 78.1* 72.9* 38.6*  --   --   HGB 12.9 12.1 9.5* 7.6* 7.0* 6.7*  HCT 38.1 36.3 28.3* 23.5* 21.7* 20.9*  MCV 92.3 97.3 95.6 99.2  --  98.1  PLT 392 411* 392 308  --  261     Basic Metabolic Panel: Recent Labs  Lab 07/09/20 0850 07/09/20 1707 07/10/20 0439 07/10/20 0455 07/11/20 0423 07/12/20 0504 07/13/20 0532  NA 147*  --  143  --  142 146* 148*  K 3.6  --  4.5  --  4.6 3.7 3.6  CL 107  --  109  --  114* 119* 120*  CO2 27  --  24  --  21* 24 24  GLUCOSE 127*  --  160*  --  145* 194* 152*  BUN 41*  --  76*  --  68* 33* 24*   CREATININE 1.01*  --  2.07*  --  1.90* 0.91 0.78  CALCIUM 8.9  --  8.0*  --  7.6* 7.9* 7.6*  MG  --    < > 3.0* 2.9* 2.6* 2.1 1.8  PHOS  --    < > 8.3* 7.5* 6.8* 2.2* 1.7*   < > = values in this interval not displayed.    GFR: Estimated Creatinine Clearance: 64.8 mL/min (by C-G formula based on SCr of 0.78 mg/dL). Recent Labs  Lab 07/09/20 0850 07/10/20 0439 07/10/20 1630 07/10/20 2020 07/11/20 0423 07/12/20 0504 07/13/20 0532  PROCALCITON 0.22 1.18  --   --  3.00  --   --   WBC 51.0* 88.9*  --   --  82.0* 42.8* 18.5*  LATICACIDVEN  --   --  1.8 1.6  --   --   --      Liver Function Tests: Recent Labs  Lab 07/11/20 0423  ALBUMIN 2.1*    ABG    Component Value Date/Time   PHART 7.39 07/10/2020 0647   PCO2ART 41 07/10/2020 0647   PO2ART 67 (L) 07/10/2020 0647   HCO3 24.8 07/10/2020 0647   ACIDBASEDEF 0.2 07/10/2020 0647   O2SAT 92.8 07/10/2020 0647    HbA1C: Hgb A1c MFr Bld  Date/Time Value Ref Range Status  06/10/2020 01:25 PM 5.8 (H) 4.8 - 5.6 % Final    Comment:    (NOTE)         Prediabetes: 5.7 - 6.4         Diabetes: >6.4         Glycemic control for adults with diabetes: <7.0   03/11/2013 12:00 AM 5.8 4.0 - 6.0 % Final    CBG: Recent Labs  Lab 07/12/20 1145 07/12/20 1529 07/12/20 1916 07/12/20 2321 07/13/20 0316  GLUCAP 152* 171* 140* 146* 149*     Allergies Allergies  Allergen Reactions   Penicillins Rash    Has patient had a PCN reaction causing immediate rash, facial/tongue/throat swelling, SOB or lightheadedness with hypotension: Yes Has patient had a PCN reaction causing severe rash involving mucus membranes or skin necrosis: No Has patient had a PCN reaction that required hospitalization: No Has patient had a PCN reaction occurring within the last 10 years: No If all of the above answers are "NO", then may proceed with Cephalosporin use.       DVT/GI PRX  assessed I Assessed the need for Labs I Assessed the need for  Foley I Assessed the need for Central Venous Line Family Discussion when available I Assessed the need for Mobilization I made an Assessment of medications to be adjusted accordingly Safety Risk assessment completed  CASE DISCUSSED IN MULTIDISCIPLINARY ROUNDS WITH ICU TEAM     Critical Care Time devoted to patient care services described in this note is 50 minutes.  Critical care was necessary to treat /prevent imminent and life-threatening deterioration. Overall, patient is critically ill, prognosis is guarded.  Patient with Multiorgan failure and at high risk for cardiac arrest and death.    Corrin Parker, M.D.  [facility]  Medical Director [place] Director [facility]  Cardio-Pulmonary Department

## 2020-07-13 NOTE — Consult Note (Signed)
Pharmacy Antibiotic Note  Tamara Mcclure is a 65 y.o. female admitted on 07/05/2020. Patient was intubated from 6/23 to 6/27 and then required reintubation 7/1 for airway protection after failing BiPAP and HHFNC. Patient with h/o COPD and currently being treated for pseudomonas and MRSA pneumonia. Following initial extubation, patient with very minimal interaction. Concern for possible lingering effects of multiple sedating medications PTA. Neurology and Psych involved. Cefepime and linezolid transitioned to vancomycin and ceftazidime with concern for possible neurotoxicity. Her renal function has improved significantly.  Plan:  1) Continue ceftazidime 2 grams IV q8h  2) Change vancomycin to 1500 mg IV q24h Goal AUC 400-550 Expected AUC: 476 SCr used: 0.8   Height: 5\' 5"  (165.1 cm) Weight: 65.8 kg (145 lb 1 oz) IBW/kg (Calculated) : 57  Temp (24hrs), Avg:99 F (37.2 C), Min:95.2 F (35.1 C), Max:100.22 F (37.9 C)  Recent Labs  Lab 07/09/20 0850 07/10/20 0439 07/10/20 1630 07/10/20 2020 07/11/20 0423 07/12/20 0500 07/12/20 0504 07/13/20 0532  WBC 51.0* 88.9*  --   --  82.0*  --  42.8* 18.5*  CREATININE 1.01* 2.07*  --   --  1.90*  --  0.91 0.78  LATICACIDVEN  --   --  1.8 1.6  --   --   --   --   VANCORANDOM  --   --   --   --   --  7  --   --      Estimated Creatinine Clearance: 64.8 mL/min (by C-G formula based on SCr of 0.78 mg/dL).    Allergies  Allergen Reactions   Penicillins Rash    Has patient had a PCN reaction causing immediate rash, facial/tongue/throat swelling, SOB or lightheadedness with hypotension: Yes Has patient had a PCN reaction causing severe rash involving mucus membranes or skin necrosis: No Has patient had a PCN reaction that required hospitalization: No Has patient had a PCN reaction occurring within the last 10 years: No If all of the above answers are "NO", then may proceed with Cephalosporin use.    Antimicrobials this  admission: Vancomycin 6/23 >> 6/27 Metronidazole 6/23 >> 6/27 Cefepime 6/23 >> 7/1 Linezolid 6/27 >> 7/1 Ceftazidime 7/1 >> Vancomycin 7/1 >>   Microbiology results: 6/23 BCx: NG 6/23 UCx: no growth 6/23 Trach aspirate: MRSA, pseudomonas 6/23 pleural fluid: no growth 6/23 MRSA PCR: positive 7/01 C diff: negative 7/01 GI panel: negative  Thank you for allowing pharmacy to be a part of this patient's care.  Dorena Bodo, PharmD 07/13/2020 8:32 AM

## 2020-07-13 NOTE — Progress Notes (Signed)
Daily Progress Note   Patient Name: Tamara Mcclure       Date: 07/13/2020 DOB: 22-Aug-1955  Age: 65 y.o. MRN#: [id number] Attending Physician: Flora Lipps, MD Primary Care Physician: Roselee Nova, MD Admit Date: 06/26/2020  Reason for Consultation/Follow-up: Establishing goals of care  Subjective: Unresponsive. No family at bedside. RN provided update - weaning pressors. Attempting to wean sedation but still requiring significant amount for vent synchrony.   Length of Stay: 11  Current Medications: Scheduled Meds:   ALPRAZolam  1 mg Per Tube TID   budesonide (PULMICORT) nebulizer solution  0.5 mg Nebulization BID   chlorhexidine gluconate (MEDLINE KIT)  15 mL Mouth Rinse BID   Chlorhexidine Gluconate Cloth  6 each Topical Daily   feeding supplement (PROSource TF)  45 mL Per Tube BID   free water  200 mL Per Tube Q4H   heparin  5,000 Units Subcutaneous Q8H   ipratropium-albuterol  3 mL Nebulization Q4H   mouth rinse  15 mL Mouth Rinse 10 times per day   midodrine  10 mg Per Tube TID WC   multivitamin  15 mL Oral Daily   pantoprazole (PROTONIX) IV  40 mg Intravenous QHS   potassium & sodium phosphates  2 packet Per Tube Q4H   senna  2 tablet Oral QHS   thiamine injection  100 mg Intravenous Q24H    Continuous Infusions:  cefTAZidime (FORTAZ)  IV Stopped (07/13/20 1132)   dexmedetomidine (PRECEDEX) IV infusion 1.2 mcg/kg/hr (07/13/20 1200)   feeding supplement (OSMOLITE 1.5 CAL) 1,000 mL (07/13/20 0039)   fentaNYL infusion INTRAVENOUS 150 mcg/hr (07/13/20 1200)   midazolam 6 mg/hr (07/13/20 1200)   norepinephrine (LEVOPHED) Adult infusion Stopped (07/12/20 1927)   vancomycin     vasopressin 0.03 Units/min (07/13/20 1200)    PRN Meds: acetaminophen, acetaminophen, fentaNYL (SUBLIMAZE)  injection, fentaNYL (SUBLIMAZE) injection, ipratropium-albuterol, midazolam, sodium phosphate  Physical Exam Constitutional:      Comments: Sedated, remains on vent  Cardiovascular:     Rate and Rhythm: Normal rate and regular rhythm.  Skin:    General: Skin is warm and dry.            Vital Signs: BP 108/67   Pulse 84   Temp 98.78 F (37.1 C)   Resp (!) 22   Ht 5' 5" (1.651 m)   Wt 65.8 kg   SpO2 97%   BMI 24.14 kg/m  SpO2: SpO2: 97 % O2 Device: O2 Device: Ventilator O2 Flow Rate: O2 Flow Rate (L/min): 6 L/min  Intake/output summary:  Intake/Output Summary (Last 24 hours) at 07/13/2020 1321 Last data filed at 07/13/2020 1200 Gross per 24 hour  Intake 6366 ml  Output 1405 ml  Net 4961 ml   LBM: Last BM Date: 07/12/20 Baseline Weight: Weight: 78 kg Most recent weight: Weight: 65.8 kg       Palliative Assessment/Data: PPS 30%      Patient Active Problem List   Diagnosis Date Noted   On mechanically assisted ventilation ([place]) 07/13/2020   Pseudomonas pneumonia ([place]) 07/13/2020   MRSA pneumonia ([place]) 07/13/2020   Acute kidney injury ([place]) 07/13/2020   Hyperkalemia 07/13/2020   Anemia 07/13/2020   Severe  protein-calorie malnutrition ([place]) 54/27/0623   Acute metabolic encephalopathy 76/28/3151   Encephalopathy    Leukocytosis    Subacute delirium 07/08/2020   Pleural effusion on right 07/07/2020   Community acquired pneumonia 07/07/2020   Acute respiratory failure ([place]) 06/12/2020   Severe sepsis with septic shock ([place]) 02/08/2017   Chronic low back pain (Primary Area of Pain) (Bilateral) (R>L) 01/12/2017   Chronic pain of lower extremity (Secondary Area of Pain) (B (R>L) 01/12/2017   Chronic pain syndrome 01/12/2017   Chronic prescription opiate use 01/12/2017   Disorder of bone, unspecified 01/12/2017   Other long term (current) drug therapy 01/12/2017   Other specified health status 01/12/2017   Chronic sacroiliac joint pain 01/12/2017   Post-nasal  drainage 10/31/2016   Joint stiffness of hand 04/07/2016   URI with cough and congestion 01/19/2016   Discoloration of skin of finger 11/26/2015   Need for home health care 07/20/2015   Decreased vision 07/20/2015   Acute sinusitis 07/16/2015   Bilateral leg pain 06/18/2015   Pain of left breast 05/21/2015   Lesion of skin of face 12/25/2014   Postprandial abdominal bloating 11/11/2014   COPD (chronic obstructive pulmonary disease) ([place]) 11/03/2014   Nasal sinus congestion 11/03/2014   Hypertension 09/02/2014   Dry skin 09/02/2014   Menopausal symptoms 08/05/2014   Peripheral sensory neuropathy 07/10/2014   Anxiety and depression 07/10/2014   Airway hyperreactivity 07/10/2014   At risk for falling 07/10/2014   Lumbosacral spondylosis 07/10/2014   DDD (degenerative disc disease), lumbosacral 07/10/2014   Discharge of ear 07/10/2014   Degenerative arthritis of lumbar spine 07/10/2014   Hyperlipidemia 07/10/2014   Dysfunction of eustachian tube 07/10/2014   ANA positive 07/10/2014   Hypomagnesemia 07/10/2014   Hypokalemia 07/10/2014   Cerumen impaction 07/10/2014   Cramps of lower extremity 07/10/2014   Leg swelling 07/10/2014   Spasm 07/10/2014   Compulsive tobacco user syndrome 07/10/2014   Atrophy of vagina 07/10/2014   Chronic radicular low back pain 06/11/2014   Insomnia 06/11/2014   Panic disorder with agoraphobia and severe panic attacks 06/11/2014   Foot pain 12/17/2013   Burning sensation of feet 12/17/2013   Numbness and tingling 12/17/2013   CAFL (chronic airflow limitation) ([place]) 06/21/2011   Clinical depression 06/21/2011    Palliative Care Assessment & Plan   HPI: Palliative Care consult requested for goals of care discussion in this 65 y.o. female  with past medical history of hypertension, depression, panic attacks, chronic lower back pain, peripheral neuropathy, and COPD.  She was admitted on 06/16/2020 after presenting to the ED via EMS from home with  concerns for respiratory failure.  Per notations patient had a fall several days prior to admission and seen at a local urgent care.  She has been taking increased doses of opioids.  During ED work-up she failed Narcan and BiPAP requiring emergent intubation for airway protection.  Prior right chest tube.  This was discontinued on 6/28.  Patient was successfully extubated on 6/27 and reintubated on 7/1.  Assessment: Previous GOC conversations reviewed.  Called patient's son Tamara Mcclure - provided update. Discussed remains on vent, weaning pressors and attempting to wean down sedation. Need for PRBC tx today. He expresses understanding and denies further questions.  We reviewed previous [place] discussion - Tamara Mcclure confirms that goals remain the same for full code/full scope of care at this time however he would not want trach/peg/LTC facility.   Recommendations/Plan: Continue full code/full scope for now, son would not want trach/peg/LTC facility  Son remains hopeful for improvement - ability to wean from ventilator PMT will follow  Code Status: Full code  Prognosis:  guarded  Discharge Planning: To Be Determined  Care plan was discussed with RN, patient's son  Thank you for allowing the Palliative Medicine Team to assist in the care of this patient.   Total Time 25 minutes Prolonged Time Billed  no       Greater than 50%  of this time was spent counseling and coordinating care related to the above assessment and plan.  Juel Burrow, DNP, [facility] Palliative Medicine Team Team Phone # [phone]  Pager [phone]

## 2020-07-14 ENCOUNTER — Inpatient Hospital Stay: Payer: Medicare Other

## 2020-07-14 LAB — CBC WITH DIFFERENTIAL/PLATELET
Abs Immature Granulocytes: 0.07 10*3/uL (ref 0.00–0.07)
Basophils Absolute: 0 10*3/uL (ref 0.0–0.1)
Basophils Relative: 0 %
Eosinophils Absolute: 0.3 10*3/uL (ref 0.0–0.5)
Eosinophils Relative: 2 %
HCT: 24.9 % — ABNORMAL LOW (ref 36.0–46.0)
Hemoglobin: 8.2 g/dL — ABNORMAL LOW (ref 12.0–15.0)
Immature Granulocytes: 1 %
Lymphocytes Relative: 11 %
Lymphs Abs: 1.5 10*3/uL (ref 0.7–4.0)
MCH: 31.9 pg (ref 26.0–34.0)
MCHC: 32.9 g/dL (ref 30.0–36.0)
MCV: 96.9 fL (ref 80.0–100.0)
Monocytes Absolute: 1.1 10*3/uL — ABNORMAL HIGH (ref 0.1–1.0)
Monocytes Relative: 8 %
Neutro Abs: 10.9 10*3/uL — ABNORMAL HIGH (ref 1.7–7.7)
Neutrophils Relative %: 78 %
Platelets: 255 10*3/uL (ref 150–400)
RBC: 2.57 MIL/uL — ABNORMAL LOW (ref 3.87–5.11)
RDW: 15.7 % — ABNORMAL HIGH (ref 11.5–15.5)
WBC: 13.8 10*3/uL — ABNORMAL HIGH (ref 4.0–10.5)
nRBC: 0 % (ref 0.0–0.2)

## 2020-07-14 LAB — IRON AND TIBC
Iron: 31 ug/dL (ref 28–170)
Saturation Ratios: 20 % (ref 10.4–31.8)
TIBC: 158 ug/dL — ABNORMAL LOW (ref 250–450)
UIBC: 127 ug/dL

## 2020-07-14 LAB — BPAM RBC
Blood Product Expiration Date: 202208072359
ISSUE DATE / TIME: 202207041137
Unit Type and Rh: 5100

## 2020-07-14 LAB — TYPE AND SCREEN
ABO/RH(D): O POS
Antibody Screen: NEGATIVE
Unit division: 0

## 2020-07-14 LAB — BASIC METABOLIC PANEL
Anion gap: 4 — ABNORMAL LOW (ref 5–15)
BUN: 23 mg/dL (ref 8–23)
CO2: 26 mmol/L (ref 22–32)
Calcium: 7.4 mg/dL — ABNORMAL LOW (ref 8.9–10.3)
Chloride: 122 mmol/L — ABNORMAL HIGH (ref 98–111)
Creatinine, Ser: 0.64 mg/dL (ref 0.44–1.00)
GFR, Estimated: >60 mL/min (ref >60)
Glucose, Bld: 125 mg/dL — ABNORMAL HIGH (ref 70–99)
Potassium: 3.1 mmol/L — ABNORMAL LOW (ref 3.5–5.1)
Sodium: 152 mmol/L — ABNORMAL HIGH (ref 135–145)

## 2020-07-14 LAB — BLOOD GAS, ARTERIAL
Acid-Base Excess: 7 mmol/L — ABNORMAL HIGH (ref 0.0–2.0)
Acid-Base Excess: 0.8 mmol/L (ref 0.0–2.0)
Acid-Base Excess: 3.3 mmol/L — ABNORMAL HIGH (ref 0.0–2.0)
Acid-base deficit: 0.2 mmol/L (ref 0.0–2.0)
Bicarbonate: 26.9 mmol/L (ref 20.0–28.0)
Bicarbonate: 27.1 mmol/L (ref 20.0–28.0)
Bicarbonate: 24.8 mmol/L (ref 20.0–28.0)
Bicarbonate: 27.8 mmol/L (ref 20.0–28.0)
FIO2: 1
FIO2: 1
FIO2: 0.3
MECHVT: 400 mL
MECHVT: 400 mL
O2 Saturation: 94.7 %
O2 Saturation: 99.3 %
O2 Saturation: 92.8 %
O2 Saturation: 94.4 %
PEEP: 10 cmH2O
PEEP: 5 cmH2O
Patient temperature: 37
Patient temperature: 37
Patient temperature: 37
Patient temperature: 37
RATE: 16 resp/min
RATE: 16 resp/min
pCO2 arterial: 25 mmHg — ABNORMAL LOW (ref 32.0–48.0)
pCO2 arterial: 49 mmHg — ABNORMAL HIGH (ref 32.0–48.0)
pCO2 arterial: 41 mmHg (ref 32.0–48.0)
pCO2 arterial: 41 mmHg (ref 32.0–48.0)
pH, Arterial: 7.64 (ref 7.350–7.450)
pH, Arterial: 7.35 (ref 7.350–7.450)
pH, Arterial: 7.39 (ref 7.350–7.450)
pH, Arterial: 7.44 (ref 7.350–7.450)
pO2, Arterial: 58 mmHg — ABNORMAL LOW (ref 83.0–108.0)
pO2, Arterial: 158 mmHg — ABNORMAL HIGH (ref 83.0–108.0)
pO2, Arterial: 67 mmHg — ABNORMAL LOW (ref 83.0–108.0)
pO2, Arterial: 70 mmHg — ABNORMAL LOW (ref 83.0–108.0)

## 2020-07-14 LAB — VITAMIN B1: Vitamin B1 (Thiamine): 344.9 nmol/L — ABNORMAL HIGH (ref 66.5–200.0)

## 2020-07-14 LAB — GLUCOSE, CAPILLARY
Glucose-Capillary: 104 mg/dL — ABNORMAL HIGH (ref 70–99)
Glucose-Capillary: 108 mg/dL — ABNORMAL HIGH (ref 70–99)
Glucose-Capillary: 118 mg/dL — ABNORMAL HIGH (ref 70–99)
Glucose-Capillary: 119 mg/dL — ABNORMAL HIGH (ref 70–99)
Glucose-Capillary: 120 mg/dL — ABNORMAL HIGH (ref 70–99)
Glucose-Capillary: 122 mg/dL — ABNORMAL HIGH (ref 70–99)

## 2020-07-14 LAB — VITAMIN B12: Vitamin B-12: 784 pg/mL (ref 180–914)

## 2020-07-14 LAB — FERRITIN: Ferritin: 456 ng/mL — ABNORMAL HIGH (ref 11–307)

## 2020-07-14 LAB — FOLATE: Folate: 10.5 ng/mL (ref >5.9)

## 2020-07-14 MED ORDER — QUETIAPINE FUMARATE 25 MG PO TABS
50.0000 mg | ORAL_TABLET | Freq: Every day | ORAL | Status: DC
  Administered 2020-07-14 (×2): 50 mg via ORAL
  Filled 2020-07-14 (×2): qty 2

## 2020-07-14 MED ORDER — POTASSIUM CHLORIDE 20 MEQ PO PACK
40.0000 meq | PACK | Freq: Once | ORAL | Status: AC
  Administered 2020-07-14: 40 meq
  Filled 2020-07-14: qty 2

## 2020-07-14 MED ORDER — OXYCODONE HCL 5 MG/5ML PO SOLN
5.0000 mg | Freq: Four times a day (QID) | ORAL | Status: DC
  Administered 2020-07-14 – 2020-07-20 (×25): 5 mg
  Filled 2020-07-14 (×25): qty 5

## 2020-07-14 NOTE — Progress Notes (Signed)
GOALS OF CARE DISCUSSION  The Clinical status was relayed to family in detail. Son at Bedside  Updated and notified of patients medical condition.    Patient remains unresponsive and will not open eyes to command.   Patient is having a weak cough and struggling to remove secretions.   Patient with increased WOB and using accessory muscles to breathe Explained to family course of therapy and the modalities  +severe COPD +severe delirium Failure to wean from vent, suffering and suffocating    Patient with Progressive multiorgan failure with a very high probablity of a very minimal chance of meaningful recovery despite all aggressive and optimal medical therapy.   Family understands the situation.  The Son has consented and agreed to DNR status Will continue medical management, I have relayed that he will need to make decisions of TRACH/PEG in next 24-48 hrs if that's what patient would want  Family are satisfied with Plan of action and management. All questions answered  Additional CC time 35 mins   Miriana Gaertner Patricia Pesa, M.D.  [facility]  Medical Director [place] Director [facility] Cardio-Pulmonary Department

## 2020-07-14 NOTE — Progress Notes (Signed)
NAME:  Tamara Mcclure, MRN:  [id number], DOB:  07/07/1955, LOS: 89 ADMISSION DATE:  06/27/2020 65 year old with history of COPD, depression, hypertension, panic attacks presenting with altered mental status, acute on chronic respiratory failure.  Patient had a fall a few days ago, seen at local urgent care.  Has been taking increasing doses of opiate.  In the ED she failed Narcan and BiPAP and got intubated.  PCCM consulted for admission   Pertinent  Medical History     has a past medical history of Anxiety, Chronic lower back pain, COPD (chronic obstructive pulmonary disease) ([place]), Depression, Hypertension, Panic disorder with agoraphobia and severe panic attacks, and Peripheral sensory neuropathy.    Micro Data:  06/20/2020: SARS-CoV-2 and influenza PCR>> negative 06/12/2020: HIV>> nonreactive 06/14/2020: Strep pneumo urinary antigen>> negative 06/30/2020: Legionella urinary antigen>>negative 06/16/2020: Pleural fluid>> no growth 06/27/2020: Blood culture>> no growth 06/23/2020: MRSA PCR>> positive 06/21/2020: Tracheal aspirate>> Pseudomonas Aeruginosa & MRSA 06/12/2020: Urine>> no growth 07/10/2020: Stool culture (GI panel)>> no growth 07/10/2020: C. difficile PCR: Negative   Antimicrobials:  Cefepime 6/23>> 7/1 Ceftriaxone 6/24>> 6/25 Flagyl 6/23>> 6/27 Vancomycin 6/23>> 6/27; re-started 7/1>> Linezolid 6/27>>7/1 PO Vancomycin 6/30>>7/02 Ceftazidime 7/1>>   Significant Hospital Events: Including procedures, antibiotic start and stop dates in addition to other pertinent events  6/23-admit, intubated, right chest tube placed, right IJ CVC placed 6/24: Weaning vent settings and Levophed (down to 8 mcg). Na+ corrected to 124 this morning (108 on admission) ~ D5 @ 75 started. Tracheal aspirate with Gram+ cocci in pairs & rare gram + rods ~ Cefepime changed to Ceftriaxone, Azithromycin d/c.  Pleural fluid consistent with EXUDATE ~ pleural fluid culture pending 6/26 remains on vent, critically  ill 6/27 Successfully extubated 6/28: Chest tube removed, remains with AMS, CT Head negative 6/29: Remains altered, concern for ? Catatonia vs. ? Osmotic Demyelination Syndrome (serum Na+ noted to have rapidly corrected during 1st 24 hrs of admission), Obtain MRI Brain,  Psych consulted 6/30: MRI Brain yesterday normal, tracking today (intermittently follows commands), obtain Neurology consult. WBC increased to 51 (23.6) ~ Peripheral Smear Normal, steroids d/c, consider Hematology consult; get CTA Chest and CT Abdomen/Pelvis 7/1: Overnight with hypoxia and respiratory distress, required intubation.  ABX changed to Ceftazidime and Vancomycin.  Hematology consulted for severe Leukocytosis.  EEG pending 7/2: Remains vent dependent, FiO2 down to 55%.  Chest x-ray improved with recruitment maneuvers 7/3: No ventilator asynchrony, heavily sedated, IV sedatives being titrated off as tolerated, not ready for SBT 7/4 severe resp failure     Interim History / Subjective:  Remains on vent Severe emphysema +pneumonia Unable to wean from vent due to resp insuffiency and hypoxia       Objective   Blood pressure 104/69, pulse 76, temperature 98.42 F (36.9 C), resp. rate (!) 26, height 5\' 5"  (1.651 m), weight 65.8 kg, SpO2 96 %.    Vent Mode: PRVC FiO2 (%):  [30 %-55 %] 30 % Set Rate:  [16 bmp] 16 bmp Vt Set:  [400 mL] 400 mL PEEP:  [5 cmH20-10 cmH20] 5 cmH20   Intake/Output Summary (Last 24 hours) at 07/14/2020 0729 Last data filed at 07/14/2020 0700 Gross per 24 hour  Intake 7623.63 ml  Output 2495 ml  Net 5128.63 ml   Filed Weights   07/10/20 0500 07/11/20 0423 07/12/20 0500  Weight: 59.6 kg 62.1 kg 65.8 kg      REVIEW OF SYSTEMS  PATIENT IS UNABLE TO PROVIDE COMPLETE REVIEW OF SYSTEMS DUE TO SEVERE  CRITICAL ILLNESS AND TOXIC METABOLIC ENCEPHALOPATHY   PHYSICAL EXAMINATION:  GENERAL:critically ill appearing, +resp distress HEAD: Normocephalic, atraumatic.  EYES: Pupils equal,  round, reactive to light.  No scleral icterus.  MOUTH: Moist mucosal membrane. NECK: Supple. PULMONARY: +rhonchi, +wheezing CARDIOVASCULAR: S1 and S2. Regular rate and rhythm. No murmurs, rubs, or gallops.  GASTROINTESTINAL: Soft, nontender, -distended. Positive bowel sounds.  MUSCULOSKELETAL: No swelling, clubbing, or edema.  NEUROLOGIC: obtunded SKIN:intact,warm,dry   Labs/imaging that I havepersonally reviewed  (right click and "Reselect all SmartList Selections" daily)      ASSESSMENT AND PLAN SYNOPSIS  Admitted for Severe ACUTE Hypoxic and Hypercapnic Respiratory Failure Acute on chronic respiratory failure due to COPD exacerbation, right lower lobe pneumonia (Pseudomonas & MRSA), & large Right Pleural Effusion   Severe ACUTE Hypoxic and Hypercapnic Respiratory Failure Extubated and re-intubated several days later -continue Mechanical Ventilator support -continue Bronchodilator Therapy -Wean Fio2 and PEEP as tolerated -VAP/VENT bundle implementation Unable to wean from vent   SEVERE COPD EXACERBATION -continue IV steroids as prescribed -continue NEB THERAPY as prescribed -morphine as needed -wean fio2 as needed and tolerated   SEVERE BENZO WITHDRAWAL -Precedex as needed Restarted xanax and pain meds   CARDIAC ICU monitoring   ACUTE KIDNEY INJURY/Renal Failure -continue Foley Catheter-assess need -Avoid nephrotoxic agents -Follow urine output, BMP -Ensure adequate renal perfusion, optimize oxygenation -Renal dose medications   NEUROLOGY Acute toxic metabolic encephalopathy, need for sedation Goal RASS -2 to -3 Follow up Neuro recs  INFECTIOUS DISEASE MRSA and Pseudomonal pneumonia -continue antibiotics as prescribed -follow up cultures   ENDO - ICU hypoglycemic\Hyperglycemia protocol -check FSBS per protocol   GI GI PROPHYLAXIS as indicated  NUTRITIONAL STATUS DIET-->TF's as tolerated Constipation protocol as  indicated   ELECTROLYTES -follow labs as needed -replace as needed -pharmacy consultation and following  Best Practice (right click and "Reselect all SmartList Selections" daily)    Diet/type: Begin tube feeds 7/2 Pain/Anxiety/Delirium protocol yes VAP protocol (if indicated): yes DVT prophylaxis: prophylactic heparin  GI prophylaxis: PPI Glucose control:  yes, SSI Central venous access:  yes, and is still indicated Arterial line:  N/A Foley:  N/A Mobility:  bed rest  PT consulted: N/A Studies pending: EEG Culture data pending: N/A Last reviewed culture data:today Antibiotics: Ceftazidime, Vancomycin, p.o. vancomycin DC'd 7/2 Flagyl DC'd 7/2 Antibiotic de-escalation:  N/A, on appropriate ABX Stop date: to be determined enthesis 14 days total) Daily labs: requested Code Status:  full code Disposition: remains critically ill, will stay in intensive care   Labs   CBC: Recent Labs  Lab 07/09/20 0850 07/10/20 0439 07/11/20 0423 07/12/20 0504 07/12/20 1416 07/13/20 0532 07/13/20 1618 07/14/20 0554  WBC 51.0* 88.9* 82.0* 42.8*  --  18.5* 16.8* 13.8*  NEUTROABS 43.0* 78.1* 72.9* 38.6*  --   --   --  10.9*  HGB 12.9 12.1 9.5* 7.6* 7.0* 6.7* 8.1* 8.2*  HCT 38.1 36.3 28.3* 23.5* 21.7* 20.9* 25.4* 24.9*  MCV 92.3 97.3 95.6 99.2  --  98.1 98.8 96.9  PLT 392 411* 392 308  --  261 243 622    Basic Metabolic Panel: Recent Labs  Lab 07/10/20 0439 07/10/20 0455 07/11/20 0423 07/12/20 0504 07/13/20 0532 07/14/20 0554  NA 143  --  142 146* 148* 152*  K 4.5  --  4.6 3.7 3.6 3.1*  CL 109  --  114* 119* 120* 122*  CO2 24  --  21* 24 24 26   GLUCOSE 160*  --  145* 194* 152* 125*  BUN  76*  --  68* 33* 24* 23  CREATININE 2.07*  --  1.90* 0.91 0.78 0.64  CALCIUM 8.0*  --  7.6* 7.9* 7.6* 7.4*  MG 3.0* 2.9* 2.6* 2.1 1.8  --   PHOS 8.3* 7.5* 6.8* 2.2* 1.7*  --    GFR: Estimated Creatinine Clearance: 64.8 mL/min (by C-G formula based on SCr of 0.64 mg/dL). Recent Labs  Lab  07/09/20 0850 07/10/20 0439 07/10/20 1630 07/10/20 2020 07/11/20 0423 07/12/20 0504 07/13/20 0532 07/13/20 1618 07/14/20 0554  PROCALCITON 0.22 1.18  --   --  3.00  --   --   --   --   WBC 51.0* 88.9*  --   --  82.0* 42.8* 18.5* 16.8* 13.8*  LATICACIDVEN  --   --  1.8 1.6  --   --   --   --   --     Liver Function Tests: Recent Labs  Lab 07/11/20 0423  ALBUMIN 2.1*   No results for input(s): LIPASE, AMYLASE in the last 168 hours. No results for input(s): AMMONIA in the last 168 hours.  ABG    Component Value Date/Time   PHART 7.44 07/14/2020 0247   PCO2ART 41 07/14/2020 0247   PO2ART 70 (L) 07/14/2020 0247   HCO3 27.8 07/14/2020 0247   ACIDBASEDEF 0.2 07/10/2020 0647   O2SAT 94.4 07/14/2020 0247     Coagulation Profile: No results for input(s): INR, PROTIME in the last 168 hours.  Cardiac Enzymes: No results for input(s): CKTOTAL, CKMB, CKMBINDEX, TROPONINI in the last 168 hours.  HbA1C: Hgb A1c MFr Bld  Date/Time Value Ref Range Status  06/30/2020 01:25 PM 5.8 (H) 4.8 - 5.6 % Final    Comment:    (NOTE)         Prediabetes: 5.7 - 6.4         Diabetes: >6.4         Glycemic control for adults with diabetes: <7.0   03/11/2013 12:00 AM 5.8 4.0 - 6.0 % Final    CBG: Recent Labs  Lab 07/13/20 1126 07/13/20 1608 07/13/20 1936 07/13/20 2342 07/14/20 0326  GLUCAP 146* 108* 129* 118* 118*    Allergies Allergies  Allergen Reactions   Penicillins Rash    Has patient had a PCN reaction causing immediate rash, facial/tongue/throat swelling, SOB or lightheadedness with hypotension: Yes Has patient had a PCN reaction causing severe rash involving mucus membranes or skin necrosis: No Has patient had a PCN reaction that required hospitalization: No Has patient had a PCN reaction occurring within the last 10 years: No If all of the above answers are "NO", then may proceed with Cephalosporin use.       DVT/GI PRX  assessed I Assessed the need for Labs I  Assessed the need for Foley I Assessed the need for Central Venous Line Family Discussion when available I Assessed the need for Mobilization I made an Assessment of medications to be adjusted accordingly Safety Risk assessment completed  CASE DISCUSSED IN MULTIDISCIPLINARY ROUNDS WITH ICU TEAM     Critical Care Time devoted to patient care services described in this note is 55 minutes.  Critical care was necessary to treat or prevent imminent or life-threatening deterioration. Overall, patient is critically ill, prognosis is guarded.  Patient with Multiorgan failure and at high risk for cardiac arrest and death.    Corrin Parker, M.D.  [facility]  Medical Director [place] Director [facility] Cardio-Pulmonary Department

## 2020-07-15 DIAGNOSIS — Z66 Do not resuscitate: Secondary | ICD-10-CM

## 2020-07-15 LAB — CBC WITH DIFFERENTIAL/PLATELET
Abs Immature Granulocytes: 0.06 10*3/uL (ref 0.00–0.07)
Basophils Absolute: 0 10*3/uL (ref 0.0–0.1)
Basophils Relative: 0 %
Eosinophils Absolute: 0.2 10*3/uL (ref 0.0–0.5)
Eosinophils Relative: 2 %
HCT: 24.6 % — ABNORMAL LOW (ref 36.0–46.0)
Hemoglobin: 8 g/dL — ABNORMAL LOW (ref 12.0–15.0)
Immature Granulocytes: 1 %
Lymphocytes Relative: 15 %
Lymphs Abs: 1.7 10*3/uL (ref 0.7–4.0)
MCH: 32.1 pg (ref 26.0–34.0)
MCHC: 32.5 g/dL (ref 30.0–36.0)
MCV: 98.8 fL (ref 80.0–100.0)
Monocytes Absolute: 1 10*3/uL (ref 0.1–1.0)
Monocytes Relative: 8 %
Neutro Abs: 9 10*3/uL — ABNORMAL HIGH (ref 1.7–7.7)
Neutrophils Relative %: 74 %
Platelets: 276 10*3/uL (ref 150–400)
RBC: 2.49 MIL/uL — ABNORMAL LOW (ref 3.87–5.11)
RDW: 15.7 % — ABNORMAL HIGH (ref 11.5–15.5)
Smear Review: NORMAL
WBC: 12 10*3/uL — ABNORMAL HIGH (ref 4.0–10.5)
nRBC: 0 % (ref 0.0–0.2)

## 2020-07-15 LAB — GLUCOSE, CAPILLARY
Glucose-Capillary: 106 mg/dL — ABNORMAL HIGH (ref 70–99)
Glucose-Capillary: 107 mg/dL — ABNORMAL HIGH (ref 70–99)
Glucose-Capillary: 108 mg/dL — ABNORMAL HIGH (ref 70–99)
Glucose-Capillary: 116 mg/dL — ABNORMAL HIGH (ref 70–99)
Glucose-Capillary: 121 mg/dL — ABNORMAL HIGH (ref 70–99)
Glucose-Capillary: 95 mg/dL (ref 70–99)

## 2020-07-15 LAB — BASIC METABOLIC PANEL
Anion gap: 1 — ABNORMAL LOW (ref 5–15)
BUN: 22 mg/dL (ref 8–23)
CO2: 25 mmol/L (ref 22–32)
Calcium: 7.4 mg/dL — ABNORMAL LOW (ref 8.9–10.3)
Chloride: 123 mmol/L — ABNORMAL HIGH (ref 98–111)
Creatinine, Ser: 0.64 mg/dL (ref 0.44–1.00)
GFR, Estimated: >60 mL/min (ref >60)
Glucose, Bld: 125 mg/dL — ABNORMAL HIGH (ref 70–99)
Potassium: 3.4 mmol/L — ABNORMAL LOW (ref 3.5–5.1)
Sodium: 149 mmol/L — ABNORMAL HIGH (ref 135–145)

## 2020-07-15 LAB — COMP PANEL: LEUKEMIA/LYMPHOMA

## 2020-07-15 LAB — MAGNESIUM: Magnesium: 1.6 mg/dL — ABNORMAL LOW (ref 1.7–2.4)

## 2020-07-15 LAB — PHOSPHORUS: Phosphorus: 1.7 mg/dL — ABNORMAL LOW (ref 2.5–4.6)

## 2020-07-15 MED ORDER — POTASSIUM CHLORIDE 20 MEQ PO PACK
40.0000 meq | PACK | Freq: Once | ORAL | Status: AC
  Administered 2020-07-15: 40 meq
  Filled 2020-07-15: qty 2

## 2020-07-15 MED ORDER — ADULT MULTIVITAMIN LIQUID CH
15.0000 mL | Freq: Every day | ORAL | Status: DC
  Administered 2020-07-15 – 2020-07-16 (×2): 15 mL
  Filled 2020-07-15 (×2): qty 15

## 2020-07-15 MED ORDER — POTASSIUM & SODIUM PHOSPHATES 280-160-250 MG PO PACK
2.0000 | PACK | ORAL | Status: AC
  Administered 2020-07-15 (×4): 2
  Filled 2020-07-15 (×4): qty 2

## 2020-07-15 MED ORDER — QUETIAPINE FUMARATE 25 MG PO TABS
50.0000 mg | ORAL_TABLET | Freq: Every day | ORAL | Status: DC
  Administered 2020-07-15 – 2020-07-19 (×5): 50 mg
  Filled 2020-07-15 (×5): qty 2

## 2020-07-15 MED ORDER — MAGNESIUM SULFATE 2 GM/50ML IV SOLN
2.0000 g | Freq: Once | INTRAVENOUS | Status: AC
  Administered 2020-07-15: 2 g via INTRAVENOUS
  Filled 2020-07-15 (×2): qty 50

## 2020-07-15 MED ORDER — SENNA 8.6 MG PO TABS
2.0000 | ORAL_TABLET | Freq: Every day | ORAL | Status: DC
  Filled 2020-07-15 (×2): qty 2

## 2020-07-15 NOTE — Progress Notes (Addendum)
GOALS OF CARE DISCUSSION  The Clinical status was relayed to family in detail. Son at bedside Updated and notified of patients medical condition.    Patient remains unresponsive and will not open eyes to command.   Patient is having a weak cough and struggling to remove secretions.   Patient with increased WOB and using accessory muscles to breathe Explained to family course of therapy and the modalities  Failure to wean from vent Severe COPD and emphysema May consider [facility] prior to extubation to optimize clearance of secretions   Patient with Progressive multiorgan failure with a very high probablity of a very minimal chance of meaningful recovery despite all aggressive and optimal medical therapy.  PATIENT REMAINS DNR status  Family understands the situation.  I was asked by the Son to Call [facility] and [facility] for second opinion  [facility] ICU Dr Annamaria Boots has reviewed the case and there is no higher level of care that they can offer at this time.   I am waiting to hear back from [facility].   Family are satisfied with Plan of action and management. All questions answered  Additional CC time 35 mins   Seward Coran Patricia Pesa, M.D.  [facility]  Medical Director [place] Director [facility] Cardio-Pulmonary Department

## 2020-07-15 NOTE — Progress Notes (Signed)
NAME:  Tamara Mcclure, MRN:  [id number], DOB:  Nov 17, 1955, LOS: 52 ADMISSION DATE:  07/05/2020 65 year old with history of COPD, depression, hypertension, panic attacks presenting with altered mental status, acute on chronic respiratory failure.  Patient had a fall a few days ago, seen at local urgent care.  Has been taking increasing doses of opiate.  In the ED she failed Narcan and BiPAP and got intubated.  PCCM consulted for admission   Pertinent  Medical History     has a past medical history of Anxiety, Chronic lower back pain, COPD (chronic obstructive pulmonary disease) ([place]), Depression, Hypertension, Panic disorder with agoraphobia and severe panic attacks, and Peripheral sensory neuropathy.    Micro Data:  06/21/2020: SARS-CoV-2 and influenza PCR>> negative 07/07/2020: HIV>> nonreactive 06/14/2020: Strep pneumo urinary antigen>> negative 06/23/2020: Legionella urinary antigen>>negative 06/23/2020: Pleural fluid>> no growth 06/17/2020: Blood culture>> no growth 06/30/2020: MRSA PCR>> positive 06/27/2020: Tracheal aspirate>> Pseudomonas Aeruginosa & MRSA 07/01/2020: Urine>> no growth 07/10/2020: Stool culture (GI panel)>> no growth 07/10/2020: C. difficile PCR: Negative   Antimicrobials:  Cefepime 6/23>> 7/1 Ceftriaxone 6/24>> 6/25 Flagyl 6/23>> 6/27 Vancomycin 6/23>> 6/27; re-started 7/1>> Linezolid 6/27>>7/1 PO Vancomycin 6/30>>7/02 Ceftazidime 7/1>>   Significant Hospital Events: Including procedures, antibiotic start and stop dates in addition to other pertinent events  6/23-admit, intubated, right chest tube placed, right IJ CVC placed 6/24: Weaning vent settings and Levophed (down to 8 mcg). Na+ corrected to 124 this morning (108 on admission) ~ D5 @ 75 started. Tracheal aspirate with Gram+ cocci in pairs & rare gram + rods ~ Cefepime changed to Ceftriaxone, Azithromycin d/c.  Pleural fluid consistent with EXUDATE ~ pleural fluid culture pending 6/26 remains on vent, critically  ill 6/27 Successfully extubated 6/28: Chest tube removed, remains with AMS, CT Head negative 6/29: Remains altered, concern for ? Catatonia vs. ? Osmotic Demyelination Syndrome (serum Na+ noted to have rapidly corrected during 1st 24 hrs of admission), Obtain MRI Brain,  Psych consulted 6/30: MRI Brain yesterday normal, tracking today (intermittently follows commands), obtain Neurology consult. WBC increased to 51 (23.6) ~ Peripheral Smear Normal, steroids d/c, consider Hematology consult; get CTA Chest and CT Abdomen/Pelvis 7/1: Overnight with hypoxia and respiratory distress, required intubation.  ABX changed to Ceftazidime and Vancomycin.  Hematology consulted for severe Leukocytosis.  EEG pending 7/2: Remains vent dependent, FiO2 down to 55%.  Chest x-ray improved with recruitment maneuvers 7/3: No ventilator asynchrony, heavily sedated, IV sedatives being titrated off as tolerated, not ready for SBT 7/4 severe resp failure, remains on vent 7/5 severe vent dyssynchrony, severe COPD, family made patient DNR     Interim History / Subjective:  Severe COPD Severe emphysema Severe resp failure Increased WOB   Objective   Blood pressure (!) 91/59, pulse 78, temperature 99.68 F (37.6 C), resp. rate (!) 23, height 5\' 5"  (1.651 m), weight 65.8 kg, SpO2 95 %.    Vent Mode: PRVC FiO2 (%):  [28 %] 28 % Set Rate:  [16 bmp] 16 bmp Vt Set:  [400 mL] 400 mL PEEP:  [5 cmH20] 5 cmH20   Intake/Output Summary (Last 24 hours) at 07/15/2020 0739 Last data filed at 07/15/2020 0700 Gross per 24 hour  Intake 1890.46 ml  Output 2135 ml  Net -244.54 ml    Filed Weights   07/10/20 0500 07/11/20 0423 07/12/20 0500  Weight: 59.6 kg 62.1 kg 65.8 kg    REVIEW OF SYSTEMS  PATIENT IS UNABLE TO PROVIDE COMPLETE REVIEW OF SYSTEMS DUE TO SEVERE CRITICAL  ILLNESS AND TOXIC METABOLIC ENCEPHALOPATHY   PHYSICAL EXAMINATION:  GENERAL:critically ill appearing, +resp distress HEAD: Normocephalic, atraumatic.   EYES: Pupils equal, round, reactive to light.  No scleral icterus.  MOUTH: Moist mucosal membrane. NECK: Supple. No thyromegaly. No nodules. No JVD.  PULMONARY: +rhonchi, +wheezing CARDIOVASCULAR: S1 and S2. Regular rate and rhythm. No murmurs, rubs, or gallops.  GASTROINTESTINAL: Soft, nontender, -distended. Positive bowel sounds.  MUSCULOSKELETAL: No swelling, clubbing, or edema.  NEUROLOGIC: obtunded SKIN:intact,warm,dry  Labs/imaging that I havepersonally reviewed  (right click and "Reselect all SmartList Selections" daily)    ASSESSMENT AND PLAN SYNOPSIS  Admitted for Severe ACUTE Hypoxic and Hypercapnic Respiratory Failure Acute on chronic respiratory failure due to COPD exacerbation, right lower lobe pneumonia (Pseudomonas & MRSA), & large Right Pleural Effusion  Severe ACUTE Hypoxic and Hypercapnic Respiratory Failure -continue Mechanical Ventilator support -continue Bronchodilator Therapy -Wean Fio2 and PEEP as tolerated -VAP/VENT bundle implementation Unable to wean from vent-severe emphysema  SEVERE COPD EXACERBATION -continue IV steroids as prescribed -continue NEB THERAPY as prescribed -morphine as needed -wean fio2 as needed and tolerated    NEUROLOGY ACUTE TOXIC METABOLIC ENCEPHALOPATHY -need for sedation -Goal RASS -2 to -3 Severe BENZO withdrawal Follow up [place] ICU monitoring  ACUTE KIDNEY INJURY/Renal Failure -continue Foley Catheter-assess need -Avoid nephrotoxic agents -Follow urine output, BMP -Ensure adequate renal perfusion, optimize oxygenation -Renal dose medications   INFECTIOUS DISEASE MRSA and Pseudomonal pneumonia -follow up cultures    GI GI PROPHYLAXIS as indicated  NUTRITIONAL STATUS DIET-->TF's as tolerated Constipation protocol as indicated   ELECTROLYTES -follow labs as needed -replace as needed -pharmacy consultation and following   Best Practice (right click and "Reselect all SmartList  Selections" daily)    Diet/type: Begin tube feeds 7/2 Pain/Anxiety/Delirium protocol yes VAP protocol (if indicated): yes DVT prophylaxis: prophylactic heparin  GI prophylaxis: PPI Glucose control:  yes, SSI Central venous access:  yes, and is still indicated Arterial line:  N/A Foley:  N/A Mobility:  bed rest  PT consulted: N/A Studies pending: EEG Culture data pending: N/A Last reviewed culture data:today Antibiotics: Ceftazidime, Vancomycin, p.o. vancomycin DC'd 7/2 Flagyl DC'd 7/2 Antibiotic de-escalation:  N/A, on appropriate ABX Stop date: to be determined enthesis 14 days total) Daily labs: requested Code Status:  full code Disposition: remains critically ill, will stay in intensive care   Labs   CBC: Recent Labs  Lab 07/10/20 0439 07/11/20 0423 07/12/20 0504 07/12/20 1416 07/13/20 0532 07/13/20 1618 07/14/20 0554 07/15/20 0406  WBC 88.9* 82.0* 42.8*  --  18.5* 16.8* 13.8* 12.0*  NEUTROABS 78.1* 72.9* 38.6*  --   --   --  10.9* 9.0*  HGB 12.1 9.5* 7.6* 7.0* 6.7* 8.1* 8.2* 8.0*  HCT 36.3 28.3* 23.5* 21.7* 20.9* 25.4* 24.9* 24.6*  MCV 97.3 95.6 99.2  --  98.1 98.8 96.9 98.8  PLT 411* 392 308  --  261 243 255 276     Basic Metabolic Panel: Recent Labs  Lab 07/10/20 0439 07/10/20 0455 07/11/20 0423 07/12/20 0504 07/13/20 0532 07/14/20 0554 07/15/20 0406  NA 143  --  142 146* 148* 152* 149*  K 4.5  --  4.6 3.7 3.6 3.1* 3.4*  CL 109  --  114* 119* 120* 122* 123*  CO2 24  --  21* 24 24 26 25   GLUCOSE 160*  --  145* 194* 152* 125* 125*  BUN 76*  --  68* 33* 24* 23 22  CREATININE 2.07*  --  1.90* 0.91 0.78  0.64 0.64  CALCIUM 8.0*  --  7.6* 7.9* 7.6* 7.4* 7.4*  MG 3.0* 2.9* 2.6* 2.1 1.8  --   --   PHOS 8.3* 7.5* 6.8* 2.2* 1.7*  --   --     GFR: Estimated Creatinine Clearance: 64.8 mL/min (by C-G formula based on SCr of 0.64 mg/dL). Recent Labs  Lab 07/09/20 0850 07/10/20 0439 07/10/20 1630 07/10/20 2020 07/11/20 0423 07/12/20 0504  07/13/20 0532 07/13/20 1618 07/14/20 0554 07/15/20 0406  PROCALCITON 0.22 1.18  --   --  3.00  --   --   --   --   --   WBC 51.0* 88.9*  --   --  82.0*   < > 18.5* 16.8* 13.8* 12.0*  LATICACIDVEN  --   --  1.8 1.6  --   --   --   --   --   --    < > = values in this interval not displayed.     Liver Function Tests: Recent Labs  Lab 07/11/20 0423  ALBUMIN 2.1*    No results for input(s): LIPASE, AMYLASE in the last 168 hours. No results for input(s): AMMONIA in the last 168 hours.  ABG    Component Value Date/Time   PHART 7.44 07/14/2020 0247   PCO2ART 41 07/14/2020 0247   PO2ART 70 (L) 07/14/2020 0247   HCO3 27.8 07/14/2020 0247   ACIDBASEDEF 0.2 07/10/2020 0647   O2SAT 94.4 07/14/2020 0247      Coagulation Profile: No results for input(s): INR, PROTIME in the last 168 hours.  Cardiac Enzymes: No results for input(s): CKTOTAL, CKMB, CKMBINDEX, TROPONINI in the last 168 hours.  HbA1C: Hgb A1c MFr Bld  Date/Time Value Ref Range Status  06/14/2020 01:25 PM 5.8 (H) 4.8 - 5.6 % Final    Comment:    (NOTE)         Prediabetes: 5.7 - 6.4         Diabetes: >6.4         Glycemic control for adults with diabetes: <7.0   03/11/2013 12:00 AM 5.8 4.0 - 6.0 % Final    CBG: Recent Labs  Lab 07/14/20 1623 07/14/20 1956 07/14/20 2352 07/15/20 0324 07/15/20 0736  GLUCAP 104* 122* 119* 107* 95     Allergies Allergies  Allergen Reactions   Penicillins Rash    Has patient had a PCN reaction causing immediate rash, facial/tongue/throat swelling, SOB or lightheadedness with hypotension: Yes Has patient had a PCN reaction causing severe rash involving mucus membranes or skin necrosis: No Has patient had a PCN reaction that required hospitalization: No Has patient had a PCN reaction occurring within the last 10 years: No If all of the above answers are "NO", then may proceed with Cephalosporin use.       DVT/GI PRX  assessed I Assessed the need for Labs I  Assessed the need for Foley I Assessed the need for Central Venous Line Family Discussion when available I Assessed the need for Mobilization I made an Assessment of medications to be adjusted accordingly Safety Risk assessment completed  CASE DISCUSSED IN MULTIDISCIPLINARY ROUNDS WITH ICU TEAM     Critical Care Time devoted to patient care services described in this note is 50 minutes.  Critical care was necessary to treat /prevent imminent and life-threatening deterioration. Overall, patient is critically ill, prognosis is guarded.  Patient with Multiorgan failure and at high risk for cardiac arrest and death.    Corrin Parker, M.D.  [facility]  Medical Director [place] Director [facility] Cardio-Pulmonary Department

## 2020-07-15 NOTE — Progress Notes (Signed)
Daily Progress Note   Patient Name: Tamara Mcclure       Date: 07/15/2020 DOB: February 21, 1955  Age: 65 y.o. MRN#: [id number] Attending Physician: Flora Lipps, MD Primary Care Physician: Roselee Nova, MD Admit Date: 06/22/2020  Reason for Consultation/Follow-up: Establishing goals of care  Subjective: Unresponsive. No family at bedside. RN provided update - off pressors. Still on significant amount of sedation but weaning. Opens eyes some to commands.   Length of Stay: 13  Current Medications: Scheduled Meds:   ALPRAZolam  1 mg Per Tube TID   budesonide (PULMICORT) nebulizer solution  0.5 mg Nebulization BID   chlorhexidine gluconate (MEDLINE KIT)  15 mL Mouth Rinse BID   Chlorhexidine Gluconate Cloth  6 each Topical Daily   feeding supplement (PROSource TF)  45 mL Per Tube BID   free water  200 mL Per Tube Q4H   heparin  5,000 Units Subcutaneous Q8H   ipratropium-albuterol  3 mL Nebulization Q4H   mouth rinse  15 mL Mouth Rinse 10 times per day   midodrine  10 mg Per Tube TID WC   multivitamin  15 mL Per Tube Daily   oxyCODONE  5 mg Per Tube Q6H   pantoprazole (PROTONIX) IV  40 mg Intravenous QHS   potassium & sodium phosphates  2 packet Per Tube Q4H   QUEtiapine  50 mg Per Tube QHS   senna  2 tablet Per Tube QHS   thiamine injection  100 mg Intravenous Q24H    Continuous Infusions:  dexmedetomidine (PRECEDEX) IV infusion 1.2 mcg/kg/hr (07/15/20 1800)   feeding supplement (OSMOLITE 1.5 CAL) 1,000 mL (07/15/20 0340)   fentaNYL infusion INTRAVENOUS 100 mcg/hr (07/15/20 1800)   midazolam 2 mg/hr (07/15/20 1800)   norepinephrine (LEVOPHED) Adult infusion Stopped (07/12/20 1927)   vasopressin Stopped (07/14/20 1332)    PRN Meds: acetaminophen, acetaminophen, fentaNYL (SUBLIMAZE)  injection, fentaNYL (SUBLIMAZE) injection, ipratropium-albuterol, midazolam, sodium phosphate  Physical Exam Constitutional:      Comments: Sedated, remains on vent  Cardiovascular:     Rate and Rhythm: Normal rate and regular rhythm.  Skin:    General: Skin is warm and dry.            Vital Signs: BP (!) 133/94   Pulse 94   Temp 99.14 F (37.3 C)   Resp (!) 26   Ht 5' 5"  (1.651 m)   Wt 65.8 kg   SpO2 97%   BMI 24.14 kg/m  SpO2: SpO2: 97 % O2 Device: O2 Device: Ventilator O2 Flow Rate: O2 Flow Rate (L/min): 6 L/min  Intake/output summary:  Intake/Output Summary (Last 24 hours) at 07/15/2020 1843 Last data filed at 07/15/2020 1800 Gross per 24 hour  Intake 3758.75 ml  Output 1880 ml  Net 1878.75 ml    LBM: Last BM Date: 07/15/20 Baseline Weight: Weight: 78 kg Most recent weight: Weight: 65.8 kg       Palliative Assessment/Data: PPS 30%    Flowsheet Rows    Flowsheet Row Most Recent Value  Intake Tab   Referral Department Critical care  Unit at Time of Referral ICU  Palliative Care Primary Diagnosis Pulmonary  Date Notified 07/10/20  Palliative Care Type New  Palliative care  Reason for referral Clarify Goals of Care  Date of Admission 06/18/2020  Date first seen by Palliative Care 07/10/20  # of days Palliative referral response time 0 Day(s)  # of days IP prior to Palliative referral 8  Clinical Assessment   Psychosocial & Spiritual Assessment   Palliative Care Outcomes        Patient Active Problem List   Diagnosis Date Noted   On mechanically assisted ventilation ([place]) 07/13/2020   Pseudomonas pneumonia ([place]) 07/13/2020   MRSA pneumonia ([place]) 07/13/2020   Acute kidney injury ([place]) 07/13/2020   Hyperkalemia 07/13/2020   Anemia 07/13/2020   Severe protein-calorie malnutrition ([place]) 94/49/6759   Acute metabolic encephalopathy 16/38/4665   Encephalopathy    Acute respiratory distress    Leukocytosis    Subacute delirium 07/08/2020   Pleural  effusion on right 07/07/2020   Community acquired pneumonia 07/07/2020   Acute respiratory failure ([place]) 06/10/2020   Severe sepsis with septic shock ([place]) 02/08/2017   Chronic low back pain (Primary Area of Pain) (Bilateral) (R>L) 01/12/2017   Chronic pain of lower extremity (Secondary Area of Pain) (B (R>L) 01/12/2017   Chronic pain syndrome 01/12/2017   Chronic prescription opiate use 01/12/2017   Disorder of bone, unspecified 01/12/2017   Other long term (current) drug therapy 01/12/2017   Other specified health status 01/12/2017   Chronic sacroiliac joint pain 01/12/2017   Post-nasal drainage 10/31/2016   Joint stiffness of hand 04/07/2016   URI with cough and congestion 01/19/2016   Discoloration of skin of finger 11/26/2015   Need for home health care 07/20/2015   Decreased vision 07/20/2015   Acute sinusitis 07/16/2015   Bilateral leg pain 06/18/2015   Pain of left breast 05/21/2015   Lesion of skin of face 12/25/2014   Postprandial abdominal bloating 11/11/2014   COPD (chronic obstructive pulmonary disease) ([place]) 11/03/2014   Nasal sinus congestion 11/03/2014   Hypertension 09/02/2014   Dry skin 09/02/2014   Menopausal symptoms 08/05/2014   Peripheral sensory neuropathy 07/10/2014   Anxiety and depression 07/10/2014   Airway hyperreactivity 07/10/2014   At risk for falling 07/10/2014   Lumbosacral spondylosis 07/10/2014   DDD (degenerative disc disease), lumbosacral 07/10/2014   Discharge of ear 07/10/2014   Degenerative arthritis of lumbar spine 07/10/2014   Hyperlipidemia 07/10/2014   Dysfunction of eustachian tube 07/10/2014   ANA positive 07/10/2014   Hypomagnesemia 07/10/2014   Hypokalemia 07/10/2014   Cerumen impaction 07/10/2014   Cramps of lower extremity 07/10/2014   Leg swelling 07/10/2014   Spasm 07/10/2014   Compulsive tobacco user syndrome 07/10/2014   Atrophy of vagina 07/10/2014   Chronic radicular low back pain 06/11/2014   Insomnia 06/11/2014    Panic disorder with agoraphobia and severe panic attacks 06/11/2014   Foot pain 12/17/2013   Burning sensation of feet 12/17/2013   Numbness and tingling 12/17/2013   CAFL (chronic airflow limitation) ([place]) 06/21/2011   Clinical depression 06/21/2011    Palliative Care Assessment & Plan   HPI: Palliative Care consult requested for goals of care discussion in this 65 y.o. female  with past medical history of hypertension, depression, panic attacks, chronic lower back pain, peripheral neuropathy, and COPD.  She was admitted on 07/07/2020 after presenting to the ED via EMS from home with concerns for respiratory failure.  Per notations patient had a fall several days prior to admission and seen at a local urgent care.  She has been taking increased doses of opioids.  During ED  work-up she failed Narcan and BiPAP requiring emergent intubation for airway protection.  Prior right chest tube.  This was discontinued on 6/28.  Patient was successfully extubated on 6/27 and reintubated on 7/1.  Assessment: Met with [place] in person today.  We review patient's condition - reviewed some improvements but overall remains critically ill. [place] expresses understanding.   We review that he has requested second opinion from [facility] - he tells me that other family members have told him to consider this. He feels like he should do this so that he can feel as though he has "explored all options". He does not want to feel like he has "given up".   We review limitations that have been made in Ms [place] care - No trach or peg or LTC facility. He remains firm in these decisions telling me Ms. Geisinger would definitely not be okay with any of these options. He tells me Ms. Farnam would likely not be okay with the level of medical care she has already received.  We review Ms [place] condition prior to hospitalization - He tells me she was doing well - he had noticed some decline like frequent falls and lessened appetite  but she was still living independently and able to care for herself. In fact, she cared for Dakota's children daily.   We discuss waiting to see how [facility] and [facility] respond to request for transfer.   We discuss that if Ms. Dehaas is not able to transfer what the next steps may look like if she continues to be unable to wean from the ventilator. We discuss considering a one way extubation - optimizing Ms. Casillas for extubation and proceeding but if she decompensates we will transition to focus on comfort and not reintubate. He expresses understanding.  We discuss plan to follow up again tomorrow.   Plan reviewed with Dr Mortimer Fries.  Recommendations/Plan: Son remains firm in decision for no trach/peg/LTC facility - would not be in line with patient's wishes Son requesting second opinion from [facility] One way extubation discussed - son hopeful for optimization as much as possible prior to extubation  Code Status: DNR  Prognosis:  guarded  Discharge Planning: To Be Determined  Care plan was discussed with RN, patient's son, Dr. Mortimer Fries  Thank you for allowing the Palliative Medicine Team to assist in the care of this patient.   Total Time 50 minutes Prolonged Time Billed  no       Greater than 50%  of this time was spent counseling and coordinating care related to the above assessment and plan.  Juel Burrow, DNP, [facility] Palliative Medicine Team Team Phone # [phone]  Pager [phone]

## 2020-07-15 NOTE — Consult Note (Signed)
PHARMACY CONSULT NOTE - FOLLOW UP  Pharmacy Consult for Electrolyte Monitoring and Replacement   Recent Labs: Potassium (mmol/L)  Date Value  07/15/2020 3.4 (L)  04/12/2014 3.4 (L)   Magnesium (mg/dL)  Date Value  07/15/2020 1.6 (L)  04/12/2014 1.4 (L)   Calcium (mg/dL)  Date Value  07/15/2020 7.4 (L)   Calcium, Total (mg/dL)  Date Value  04/12/2014 7.8 (L)   Albumin (g/dL)  Date Value  07/11/2020 2.1 (L)  01/12/2017 4.8  04/12/2014 3.0 (L)   Phosphorus (mg/dL)  Date Value  07/15/2020 1.7 (L)   Sodium (mmol/L)  Date Value  07/15/2020 149 (H)  01/12/2017 130 (L)  04/12/2014 136   Corrected Ca: 8.9 mg/dL  Assessment: 65 year old with history of COPD, HTN, presenting with AMS and acute on chronic respiratory failure. Pt was intubated on 6/23, extubated 6/27 and subsequently reintubated 7/1. Patient has been treated for pneumonia s/t MRSA and pseudomonas.  Pharmacy consulted to monitor and replenish electrolytes while in CCU.  Nutrition:  Osmolite @ 45 mL/hr  PROSource TF 45 mL BID  Free Water 200 mL q4h   Goal of Therapy: Electrolytes wnl  Plan: Potassium 40 mEq per tube Phos NaK 2 packets per tube q4h x 4 doses Magnesium 2 g IV x 1 F/u monitor electrolytes in the morning   Tawnya Crook, PharmD

## 2020-07-16 LAB — BASIC METABOLIC PANEL
Anion gap: 7 (ref 5–15)
BUN: 19 mg/dL (ref 8–23)
CO2: 25 mmol/L (ref 22–32)
Calcium: 7.9 mg/dL — ABNORMAL LOW (ref 8.9–10.3)
Chloride: 119 mmol/L — ABNORMAL HIGH (ref 98–111)
Creatinine, Ser: 0.63 mg/dL (ref 0.44–1.00)
GFR, Estimated: >60 mL/min (ref >60)
Glucose, Bld: 118 mg/dL — ABNORMAL HIGH (ref 70–99)
Potassium: 3.8 mmol/L (ref 3.5–5.1)
Sodium: 151 mmol/L — ABNORMAL HIGH (ref 135–145)

## 2020-07-16 LAB — CBC WITH DIFFERENTIAL/PLATELET
Abs Immature Granulocytes: 0.09 10*3/uL — ABNORMAL HIGH (ref 0.00–0.07)
Basophils Absolute: 0 10*3/uL (ref 0.0–0.1)
Basophils Relative: 0 %
Eosinophils Absolute: 0.2 10*3/uL (ref 0.0–0.5)
Eosinophils Relative: 2 %
HCT: 28.1 % — ABNORMAL LOW (ref 36.0–46.0)
Hemoglobin: 8.9 g/dL — ABNORMAL LOW (ref 12.0–15.0)
Immature Granulocytes: 1 %
Lymphocytes Relative: 15 %
Lymphs Abs: 2 10*3/uL (ref 0.7–4.0)
MCH: 31.4 pg (ref 26.0–34.0)
MCHC: 31.7 g/dL (ref 30.0–36.0)
MCV: 99.3 fL (ref 80.0–100.0)
Monocytes Absolute: 0.9 10*3/uL (ref 0.1–1.0)
Monocytes Relative: 7 %
Neutro Abs: 10 10*3/uL — ABNORMAL HIGH (ref 1.7–7.7)
Neutrophils Relative %: 75 %
Platelets: 352 10*3/uL (ref 150–400)
RBC: 2.83 MIL/uL — ABNORMAL LOW (ref 3.87–5.11)
RDW: 15.9 % — ABNORMAL HIGH (ref 11.5–15.5)
Smear Review: NORMAL
WBC: 13.3 10*3/uL — ABNORMAL HIGH (ref 4.0–10.5)
nRBC: 0 % (ref 0.0–0.2)

## 2020-07-16 LAB — GLUCOSE, CAPILLARY
Glucose-Capillary: 110 mg/dL — ABNORMAL HIGH (ref 70–99)
Glucose-Capillary: 112 mg/dL — ABNORMAL HIGH (ref 70–99)
Glucose-Capillary: 151 mg/dL — ABNORMAL HIGH (ref 70–99)
Glucose-Capillary: 102 mg/dL — ABNORMAL HIGH (ref 70–99)
Glucose-Capillary: 129 mg/dL — ABNORMAL HIGH (ref 70–99)
Glucose-Capillary: 154 mg/dL — ABNORMAL HIGH (ref 70–99)

## 2020-07-16 LAB — MAGNESIUM: Magnesium: 2 mg/dL (ref 1.7–2.4)

## 2020-07-16 LAB — PHOSPHORUS: Phosphorus: 3 mg/dL (ref 2.5–4.6)

## 2020-07-16 MED ORDER — LORAZEPAM 2 MG/ML IJ SOLN
4.0000 mg | Freq: Once | INTRAMUSCULAR | Status: AC
  Administered 2020-07-16: 4 mg via INTRAVENOUS
  Filled 2020-07-16: qty 2

## 2020-07-16 MED ORDER — VECURONIUM BROMIDE 10 MG IV SOLR
20.0000 mg | Freq: Once | INTRAVENOUS | Status: AC
  Administered 2020-07-16: 20 mg via INTRAVENOUS
  Filled 2020-07-16: qty 20

## 2020-07-16 MED ORDER — PROSOURCE TF PO LIQD
45.0000 mL | Freq: Three times a day (TID) | ORAL | Status: DC
  Administered 2020-07-16 – 2020-07-19 (×10): 45 mL
  Filled 2020-07-16 (×12): qty 45

## 2020-07-16 NOTE — Procedures (Signed)
PROCEDURE: BRONCHOSCOPY Therapeutic Aspiration of Tracheobronchial Tree  PROCEDURE DATE: 07/16/2020  TIME:  NAME:  Tamara Mcclure  DOB:[birth year]  MRN: [id number] LOC:  IC05A/IC05A-AA    HOSP DAY: _0 @ CODE STATUS:      Code Status Orders  (From admission, onward)           Start     Ordered   07/14/20 1736  Do not attempt resuscitation (DNR)  Continuous       Question Answer Comment  In the event of cardiac or respiratory ARREST Do not call a "code blue"   In the event of cardiac or respiratory ARREST Do not perform Intubation, CPR, defibrillation or ACLS   In the event of cardiac or respiratory ARREST Use medication by any route, position, wound care, and other measures to relive pain and suffering. May use oxygen, suction and manual treatment of airway obstruction as needed for comfort.      07/14/20 1735           Code Status History     Date Active Date Inactive Code Status Order ID Comments User Context   06/30/2020 1141 07/14/2020 1735 Full Code [id number]  Marshell Garfinkel, MD ED   02/08/2017 2016 02/11/2017 1818 Full Code [id number]  Demetrios Loll, MD ED           Indications/Preliminary Diagnosis: MUCOID secretions  Consent: (Place X beside choice/s below)  The benefits, risks and possible complications of the procedure were        explained to:  ___ patient  _x__ patient's family  ___ other:___________  who verbalized understanding and gave:  _x__ verbal  ___ written  ___ verbal and written  ___ telephone  ___ other:________ consent.      Unable to obtain consent; procedure performed on emergent basis.     Other:       PRESEDATION ASSESSMENT: History and Physical has been performed. Patient meds and allergies have been reviewed. Presedation airway examination has been performed and documented. Baseline vital signs, sedation score, oxygenation status, and cardiac rhythm were reviewed. Patient was deemed to be in satisfactory condition to undergo  the procedure.    PREMEDICATIONS:   Sedative/Narcotic Amt Dose   Versed infusion mg   Fentanyl infusion mcg  Diprivan  mg  VEC 20 mg        PROCEDURE DETAILS: Timeout performed and correct patient, name, & ID confirmed. Following prep per Pulmonary policy, appropriate sedation was administered. The Bronchoscope was inserted in to oral cavity with bite block in place. Therapeutic aspiration of Tracheobronchial tree was performed.  Airway exam proceeded with findings, technical procedures, and specimen collection as noted below. At the end of exam the scope was withdrawn without incident. Impression and Plan as noted below.       Insertion Route (Place X beside choice below)   Nasal   Oral  x Endotracheal Tube   Tracheostomy  TECHNICAL PROCEDURES: (Place X beside choice below)   Procedures  Description    None     Electrocautery     Cryotherapy     Balloon Dilatation     Bronchography     Stent Placement   x  Therapeutic Aspiration Thick mucoid secretions in all segments of the lungs    Laser/Argon Plasma    Brachytherapy Catheter Placement    Foreign Body Removal         SPECIMENS (Sites): (Place X beside choice below)  Specimens Description   No Specimens Obtained  [place]   x Lavage LLL 40 cc's instilled, 10 cc's extracted with mucoid secretions   Biopsies    Fine Needle Aspirates    Brushings    Sputum    FINDINGS: Thick mucoid secretions in all segments ESTIMATED BLOOD LOSS: none COMPLICATIONS/RESOLUTION: none      IMPRESSION:POST-PROCEDURE DX: aspiration pneumonia    RECOMMENDATION/PLAN:  Continue vent support Follow up BAL cultures   THICK MUCOID SECRETIONS RLL   THICK MUCOID SECRETIONS LUL  Corrin Parker, M.D.  [facility]  Medical Director [place] Director [place] Department

## 2020-07-16 NOTE — Progress Notes (Signed)
Daily Progress Note   Patient Name: Tamara Mcclure       Date: 07/16/2020 DOB: 1955/09/02  Age: 65 y.o. MRN#: [id number] Attending Physician: Flora Lipps, MD Primary Care Physician: Roselee Nova, MD Admit Date: 06/26/2020  Reason for Consultation/Follow-up: Establishing goals of care  Subjective: Unresponsive. No family at bedside. Just had bronch by Dr. Mortimer Fries - lots of thick secretions. Poor mental status continues. Still with significant sedation needs. Remains off pressors.   Length of Stay: 14  Current Medications: Scheduled Meds:   ALPRAZolam  1 mg Per Tube TID   budesonide (PULMICORT) nebulizer solution  0.5 mg Nebulization BID   chlorhexidine gluconate (MEDLINE KIT)  15 mL Mouth Rinse BID   Chlorhexidine Gluconate Cloth  6 each Topical Daily   feeding supplement (PROSource TF)  45 mL Per Tube BID   free water  200 mL Per Tube Q4H   heparin  5,000 Units Subcutaneous Q8H   ipratropium-albuterol  3 mL Nebulization Q4H   mouth rinse  15 mL Mouth Rinse 10 times per day   midodrine  10 mg Per Tube TID WC   multivitamin  15 mL Per Tube Daily   oxyCODONE  5 mg Per Tube Q6H   pantoprazole (PROTONIX) IV  40 mg Intravenous QHS   QUEtiapine  50 mg Per Tube QHS   senna  2 tablet Per Tube QHS   thiamine injection  100 mg Intravenous Q24H    Continuous Infusions:  dexmedetomidine (PRECEDEX) IV infusion 1.2 mcg/kg/hr (07/16/20 1000)   feeding supplement (OSMOLITE 1.5 CAL) 1,000 mL (07/16/20 0951)   fentaNYL infusion INTRAVENOUS 125 mcg/hr (07/16/20 1000)   midazolam 3 mg/hr (07/16/20 1000)    PRN Meds: acetaminophen, acetaminophen, fentaNYL (SUBLIMAZE) injection, fentaNYL (SUBLIMAZE) injection, ipratropium-albuterol, midazolam, sodium phosphate  Physical Exam Constitutional:       Comments: Sedated, remains on vent  Cardiovascular:     Rate and Rhythm: Tachycardia present.            Vital Signs: BP 139/79   Pulse (!) 129   Temp 99.5 F (37.5 C)   Resp 16   Ht 5' 5"  (1.651 m)   Wt 72.1 kg   SpO2 90%   BMI 26.45 kg/m  SpO2: SpO2: 90 % O2 Device: O2 Device: Ventilator O2 Flow Rate: O2 Flow Rate (L/min): 6 L/min  Intake/output summary:  Intake/Output Summary (Last 24 hours) at 07/16/2020 1058 Last data filed at 07/16/2020 1000 Gross per 24 hour  Intake 3500.84 ml  Output 2475 ml  Net 1025.84 ml    LBM: Last BM Date: 07/16/20 Baseline Weight: Weight: 78 kg Most recent weight: Weight: 72.1 kg       Palliative Assessment/Data: PPS 30%    Flowsheet Rows    Flowsheet Row Most Recent Value  Intake Tab   Referral Department Critical care  Unit at Time of Referral ICU  Palliative Care Primary Diagnosis Pulmonary  Date Notified 07/10/20  Palliative Care Type New Palliative care  Reason for referral Clarify Goals of Care  Date of Admission 07/07/2020  Date first seen by Palliative Care 07/10/20  # of days Palliative referral response time 0 Day(s)  # of days IP  prior to Palliative referral 8  Clinical Assessment   Psychosocial & Spiritual Assessment   Palliative Care Outcomes        Patient Active Problem List   Diagnosis Date Noted   On mechanically assisted ventilation ([place]) 07/13/2020   Pseudomonas pneumonia ([place]) 07/13/2020   MRSA pneumonia ([place]) 07/13/2020   Acute kidney injury ([place]) 07/13/2020   Hyperkalemia 07/13/2020   Anemia 07/13/2020   Severe protein-calorie malnutrition ([place]) 73/41/9379   Acute metabolic encephalopathy 02/40/9735   Encephalopathy    Acute respiratory distress    Leukocytosis    Subacute delirium 07/08/2020   Pleural effusion on right 07/07/2020   Community acquired pneumonia 07/07/2020   Acute respiratory failure ([place]) 06/21/2020   Severe sepsis with septic shock ([place]) 02/08/2017   Chronic low back pain  (Primary Area of Pain) (Bilateral) (R>L) 01/12/2017   Chronic pain of lower extremity (Secondary Area of Pain) (B (R>L) 01/12/2017   Chronic pain syndrome 01/12/2017   Chronic prescription opiate use 01/12/2017   Disorder of bone, unspecified 01/12/2017   Other long term (current) drug therapy 01/12/2017   Other specified health status 01/12/2017   Chronic sacroiliac joint pain 01/12/2017   Post-nasal drainage 10/31/2016   Joint stiffness of hand 04/07/2016   URI with cough and congestion 01/19/2016   Discoloration of skin of finger 11/26/2015   Need for home health care 07/20/2015   Decreased vision 07/20/2015   Acute sinusitis 07/16/2015   Bilateral leg pain 06/18/2015   Pain of left breast 05/21/2015   Lesion of skin of face 12/25/2014   Postprandial abdominal bloating 11/11/2014   COPD (chronic obstructive pulmonary disease) ([place]) 11/03/2014   Nasal sinus congestion 11/03/2014   Hypertension 09/02/2014   Dry skin 09/02/2014   Menopausal symptoms 08/05/2014   Peripheral sensory neuropathy 07/10/2014   Anxiety and depression 07/10/2014   Airway hyperreactivity 07/10/2014   At risk for falling 07/10/2014   Lumbosacral spondylosis 07/10/2014   DDD (degenerative disc disease), lumbosacral 07/10/2014   Discharge of ear 07/10/2014   Degenerative arthritis of lumbar spine 07/10/2014   Hyperlipidemia 07/10/2014   Dysfunction of eustachian tube 07/10/2014   ANA positive 07/10/2014   Hypomagnesemia 07/10/2014   Hypokalemia 07/10/2014   Cerumen impaction 07/10/2014   Cramps of lower extremity 07/10/2014   Leg swelling 07/10/2014   Spasm 07/10/2014   Compulsive tobacco user syndrome 07/10/2014   Atrophy of vagina 07/10/2014   Chronic radicular low back pain 06/11/2014   Insomnia 06/11/2014   Panic disorder with agoraphobia and severe panic attacks 06/11/2014   Foot pain 12/17/2013   Burning sensation of feet 12/17/2013   Numbness and tingling 12/17/2013   CAFL (chronic airflow  limitation) ([place]) 06/21/2011   Clinical depression 06/21/2011    Palliative Care Assessment & Plan   HPI: Palliative Care consult requested for goals of care discussion in this 65 y.o. female  with past medical history of hypertension, depression, panic attacks, chronic lower back pain, peripheral neuropathy, and COPD.  She was admitted on 07/06/2020 after presenting to the ED via EMS from home with concerns for respiratory failure.  Per notations patient had a fall several days prior to admission and seen at a local urgent care.  She has been taking increased doses of opioids.  During ED work-up she failed Narcan and BiPAP requiring emergent intubation for airway protection.  Prior right chest tube.  This was discontinued on 6/28.  Patient was successfully extubated on 6/27 and reintubated on 7/1.  Assessment: Spoke with  [place] via phone. Provided updates - he spoke with Dr. Mortimer Fries this morning and is aware [facility] declined transfer. We reviewed bronch this AM. Discussed plan with [place] and Dr. Mortimer Fries to continue current measures over weekend and reevaluate Monday. All agree. All of Dakota's questions and concerns were addressed. He has my number for further needs.   Recommendations/Plan: Son remains firm in decision for no trach/peg/LTC facility - would not be in line with patient's wishes One way extubation discussed - son hopeful for optimization as much as possible prior to extubation Plan to continue current measures over weekend and reevaluate Monday  Code Status: DNR  Prognosis:  guarded  Discharge Planning: To Be Determined  Care plan was discussed with RN, patient's son, Dr. Mortimer Fries  Thank you for allowing the Palliative Medicine Team to assist in the care of this patient.   Total Time 20 minutes Prolonged Time Billed  no       Greater than 50%  of this time was spent counseling and coordinating care related to the above assessment and plan.  Juel Burrow, DNP,  [facility] Palliative Medicine Team Team Phone # [phone]  Pager [phone]

## 2020-07-16 NOTE — Progress Notes (Signed)
GOALS OF CARE DISCUSSION  The Clinical status was relayed to family in detail.  Updated and notified of patients medical condition.    Patient remains unresponsive and will not open eyes to command.   Patient is having a weak cough and struggling to remove secretions.   Patient with increased WOB and using accessory muscles to breathe Explained to family course of therapy and the modalities  Patient with increased WOB  Increased secretions Will not wean today Plan for [facility]   Patient with Progressive multiorgan failure with a very high probablity of a very minimal chance of meaningful recovery despite all aggressive and optimal medical therapy.  PATIENT REMAINS DNR status   Family understands the situation. I was asked by the Son to Call [facility] and [facility] for second opinion   [facility] ICU Dr Annamaria Boots has reviewed the case and there is no higher level of care that they can offer at this time.  [facility] Dr Caryn Section has reviewed the case and there is no higher level of care that they can offer at this time.     The Risks and Benefits of the Bronchoscopy procedure  were explained to Family.  I have discussed the risk for Acute Bleeding, increased chance of Infection, increased chance of Respiratory Failure and Cardiac Arrest, increased chance of pneumothorax and collapsed lung, as well as increased Stroke and Death.  The procedure consists of a video camera with a light source to be placed and inserted  into the lungs to remove secretions  The patient understand the risks and benefits and have agreed to proceed with procedure.   Family are satisfied with Plan of action and management. All questions answered  Additional CC time 35 mins   Seiji Wiswell Patricia Pesa, M.D.  [facility]  Medical Director [place] Director [facility] Cardio-Pulmonary Department

## 2020-07-16 NOTE — Progress Notes (Signed)
Nutrition Follow-up  DOCUMENTATION CODES:   Not applicable  INTERVENTION:   Continue Osmolite 1.5 _0 /hr + ProSource TF 5m TID via tube   Free water flushes 2041mq4 hours   Regimen provides 1740kcal/day, 101g/day protein and [id number] free water   NUTRITION DIAGNOSIS:   Inadequate oral intake related to inability to eat (ventilated) as evidenced by NPO status.  GOAL:   Provide needs based on ASPEN/SCCM guidelines  MONITOR:   Vent status, Labs, Weight trends, Skin, I & O's, TF tolerance  ASSESSMENT:   64 38ar old female with history of COPD, depression, anxiety, hypertension and panic attacks who is admitted with sepsis and PNA requiring intubation for respiratory failure  Pt extubated 6/27 and was re-intubated 7/1. Pt remains sedated and ventilated with failure to wean from vent. NGT in place. Pt tolerating tube feeds at goal rate. Pt s/p BAL today. Pt with slight hypernatremia. Refeed labs improved. Per chart, pt has remained fairly weight stable since admit; pt +3.5L on her I & Os. Palliative following for GOC; family does not want trach/PEG.   Medications reviewed and include: heparin, MVI, oxycodone, protonix, senokot, thiamine, precedex, fentanyl   Labs reviewed: Na 151(H), K 3.8 wnl, P 3.0 wnl, Mg 2.0 wnl Wbc- 13.3(H), Hgb 8.9(L), Hct 28.1(L) cbgs- 110, 112, 151 x 24 hrs  Patient is currently intubated on ventilator support MV: 6.5 L/min Temp (24hrs), Avg:99.8 F (37.7 C), Min:98.78 F (37.1 C), Max:100.4 F (38 C)  Propofol: none   MAP- >2m7m  UOP- 1950ml52miet Order:   Diet Order             Diet NPO time specified  Diet effective now                  EDUCATION NEEDS:   No education needs have been identified at this time  Skin:  Skin Assessment: Reviewed RN Assessment (ecchymosis)  Last BM:  7/7- type 7  Height:   Ht Readings from Last 1 Encounters:  07/03/20 _1  (1.651 m)    Weight:   Wt Readings from Last 1  Encounters:  07/16/20 72.1 kg    Ideal Body Weight:  56.8 kg  BMI:  Body mass index is 26.45 kg/m.  Estimated Nutritional Needs:   Kcal:  1565kcal/day  Protein:  100-110g/day  Fluid:  1.7-2.0L/day  CaseyKoleen DistanceRD, LDN Please refer to [facility] IncRD and/or RD on-call/weekend/after hours pager

## 2020-07-16 NOTE — Progress Notes (Signed)
NAME:  Tamara Mcclure, MRN:  [id number], DOB:  1955-04-06, LOS: 8 ADMISSION DATE:  06/28/2020  CHIEF COMPLAINT:  follow up severe COPD and resp failure  BRIEF SYNOPSIS  65 year old with history of COPD, depression, hypertension, panic attacks presenting with altered mental status, acute on chronic respiratory failure.  Patient had a fall a few days ago, seen at local urgent care.  Has been taking increasing doses of opiate.  In the ED she failed Narcan and BiPAP and got intubated.  PCCM consulted for admission, para pneumonic effusion s/p chest placement and removal, DX of MRSA and PSEUDOMNAL PNEUMONIA, complicated by delirium and catatonia Failed trial of extubation, re intubated and failure to wean from vent   Pertinent  Medical History     has a past medical history of Anxiety, Chronic lower back pain, COPD (chronic obstructive pulmonary disease) ([place]), Depression, Hypertension, Panic disorder with agoraphobia and severe panic attacks, and Peripheral sensory neuropathy.    Micro Data:  06/16/2020: SARS-CoV-2 and influenza PCR>> negative 06/22/2020: HIV>> nonreactive 07/01/2020: Strep pneumo urinary antigen>> negative 06/19/2020: Legionella urinary antigen>>negative 07/08/2020: Pleural fluid>> no growth 06/11/2020: Blood culture>> no growth 07/05/2020: MRSA PCR>> positive 06/26/2020: Tracheal aspirate>> Pseudomonas Aeruginosa & MRSA 07/06/2020: Urine>> no growth 07/10/2020: Stool culture (GI panel)>> no growth 07/10/2020: C. difficile PCR: Negative   Antimicrobials:  Cefepime 6/23>> 7/1 Ceftriaxone 6/24>> 6/25 Flagyl 6/23>> 6/27 Vancomycin 6/23>> 6/27; re-started 7/1>> Linezolid 6/27>>7/1 PO Vancomycin 6/30>>7/02 Ceftazidime 7/1>>   Significant Hospital Events: Including procedures, antibiotic start and stop dates in addition to other pertinent events  6/23-admit, intubated, right chest tube placed, right IJ CVC placed 6/24: Weaning vent settings and Levophed (down to 8 mcg). Na+  corrected to 124 this morning (108 on admission) ~ D5 @ 75 started. Tracheal aspirate with Gram+ cocci in pairs & rare gram + rods ~ Cefepime changed to Ceftriaxone, Azithromycin d/c.  Pleural fluid consistent with EXUDATE ~ pleural fluid culture pending 6/26 remains on vent, critically ill 6/27 Successfully extubated 6/28: Chest tube removed, remains with AMS, CT Head negative 6/29: Remains altered, concern for ? Catatonia vs. ? Osmotic Demyelination Syndrome (serum Na+ noted to have rapidly corrected during 1st 24 hrs of admission), Obtain MRI Brain,  Psych consulted 6/30: MRI Brain yesterday normal, tracking today (intermittently follows commands), obtain Neurology consult. WBC increased to 51 (23.6) ~ Peripheral Smear Normal, steroids d/c, consider Hematology consult; get CTA Chest and CT Abdomen/Pelvis 7/1: Overnight with hypoxia and respiratory distress, required intubation.  ABX changed to Ceftazidime and Vancomycin.  Hematology consulted for severe Leukocytosis.  EEG pending 7/2: Remains vent dependent, FiO2 down to 55%.  Chest x-ray improved with recruitment maneuvers 7/3: No ventilator asynchrony, heavily sedated, IV sedatives being titrated off as tolerated, not ready for SBT 7/4 severe resp failure, remains on vent 7/5 severe vent dyssynchrony, severe COPD, family made patient DNR 7/6 son askled to call [facility] and [facility] for second opnion, No there recs, will not accept patient         Antimicrobials:   Antibiotics Given (last 72 hours)     Date/Time Action Medication Dose Rate   07/13/20 1057 New Bag/Given   cefTAZidime (FORTAZ) 2 g in sodium chloride 0.9 % 100 mL IVPB 2 g 200 mL/hr   07/13/20 1325 New Bag/Given   vancomycin (VANCOREADY) IVPB 1500 mg/300 mL 1,500 mg 150 mL/hr   07/13/20 1759 New Bag/Given   cefTAZidime (FORTAZ) 2 g in sodium chloride 0.9 % 100 mL IVPB 2 g 200 mL/hr  07/14/20 0148 New Bag/Given   cefTAZidime (FORTAZ) 2 g in sodium chloride 0.9 % 100 mL IVPB 2 g  200 mL/hr            Interim History / Subjective:  Remains on vent Severe resp distress +COPD       Objective   Blood pressure 137/88, pulse (!) 120, temperature 99.32 F (37.4 C), resp. rate (!) 31, height $RemoveBe'5\' 5"'aChFcUcgX$  (1.651 m), weight 72.1 kg, SpO2 99 %.    Vent Mode: PRVC FiO2 (%):  [28 %] 28 % Set Rate:  [16 bmp] 16 bmp Vt Set:  [400 mL] 400 mL PEEP:  [5 cmH20] 5 cmH20 Plateau Pressure:  [23 cmH20] 23 cmH20   Intake/Output Summary (Last 24 hours) at 07/16/2020 0723 Last data filed at 07/16/2020 0414 Gross per 24 hour  Intake 4307.18 ml  Output 2050 ml  Net 2257.18 ml   Filed Weights   07/11/20 0423 07/12/20 0500 07/16/20 0338  Weight: 62.1 kg 65.8 kg 72.1 kg      REVIEW OF SYSTEMS  PATIENT IS UNABLE TO PROVIDE COMPLETE REVIEW OF SYSTEMS DUE TO SEVERE CRITICAL ILLNESS AND TOXIC METABOLIC ENCEPHALOPATHY   PHYSICAL EXAMINATION:  GENERAL:critically ill appearing, +resp distress HEAD: Normocephalic, atraumatic.  EYES: Pupils equal, round, reactive to light.  No scleral icterus.  MOUTH: Moist mucosal membrane. NECK: Supple. PULMONARY: +rhonchi, +wheezing CARDIOVASCULAR: S1 and S2. Regular rate and rhythm. No murmurs, rubs, or gallops.  GASTROINTESTINAL: Soft, nontender, -distended. Positive bowel sounds.  MUSCULOSKELETAL: No swelling, clubbing, or edema.  NEUROLOGIC: obtunded SKIN:intact,warm,dry   Labs/imaging that I havepersonally reviewed  (right click and "Reselect all SmartList Selections" daily)     ASSESSMENT AND PLAN SYNOPSIS   Admitted for Severe ACUTE Hypoxic and Hypercapnic Respiratory Failure Acute on chronic respiratory failure due to COPD exacerbation, right lower lobe pneumonia (Pseudomonas & MRSA), & large Right Pleural Effusion     Severe ACUTE Hypoxic and Hypercapnic Respiratory Failure -continue Mechanical Ventilator support -continue Bronchodilator Therapy -Wean Fio2 and PEEP as tolerated -VAP/VENT bundle  implementation -will perform SAT/SBT when respiratory parameters are met Consider BRonch to optimize extubation  Vent Mode: PRVC FiO2 (%):  [28 %] 28 % Set Rate:  [16 bmp] 16 bmp Vt Set:  [400 mL] 400 mL PEEP:  [5 cmH20] 5 cmH20 Plateau Pressure:  [23 cmH20] 23 cmH20   SEVERE COPD EXACERBATION -continue IV steroids as prescribed -continue NEB THERAPY as prescribed -morphine as needed -wean fio2 as needed and tolerated   SEVERE BENZO  WITHDRAWAL -Precedex as needed   CARDIAC ICU monitoring   ACUTE KIDNEY INJURY/Renal Failure -continue Foley Catheter-assess need -Avoid nephrotoxic agents -Follow urine output, BMP -Ensure adequate renal perfusion, optimize oxygenation -Renal dose medications   Intake/Output Summary (Last 24 hours) at 07/16/2020 0723 Last data filed at 07/16/2020 0414 Gross per 24 hour  Intake 4307.18 ml  Output 2050 ml  Net 2257.18 ml     NEUROLOGY Acute toxic metabolic encephalopathy, need for sedation Goal RASS -2 to -3  Severe BENZO withdrawal Follow up NEURO RECS  SEPTIC SHOCK-resolved SOURCE-MRSA and PSEUDOMONAL pneumonia -use vasopressors to keep MAP>65 as needed  INFECTIOUS DISEASE -continue antibiotics as prescribed -follow up cultures   ENDO - ICU hypoglycemic\Hyperglycemia protocol -check FSBS per protocol   GI GI PROPHYLAXIS as indicated  NUTRITIONAL STATUS DIET-->TF's as tolerated Constipation protocol as indicated   ELECTROLYTES -follow labs as needed -replace as needed -pharmacy consultation and following      Best Practice (right click and "Reselect  all SmartList Selections" daily)    Diet/type: Begin tube feeds 7/2 Pain/Anxiety/Delirium protocol yes VAP protocol (if indicated): yes DVT prophylaxis: prophylactic heparin GI prophylaxis: PPI Glucose control:  yes, SSI Central venous access:  yes, and is still indicated Arterial line:  N/A Foley:  N/A Mobility:  bed rest  PT consulted: N/A Studies  pending: EEG Culture data pending: N/A Last reviewed culture data:today Antibiotics: Ceftazidime, Vancomycin, p.o. vancomycin DC'd 7/2 Flagyl DC'd 7/2 Antibiotic de-escalation:  N/A, on appropriate ABX Stop date: to be determined enthesis 14 days total) Daily labs: requested Code Status:  full code Disposition: remains critically ill, will stay in intensive care    Labs   CBC: Recent Labs  Lab 07/11/20 0423 07/12/20 0504 07/12/20 1416 07/13/20 0532 07/13/20 1618 07/14/20 0554 07/15/20 0406 07/16/20 0330  WBC 82.0* 42.8*  --  18.5* 16.8* 13.8* 12.0* 13.3*  NEUTROABS 72.9* 38.6*  --   --   --  10.9* 9.0* 10.0*  HGB 9.5* 7.6*   < > 6.7* 8.1* 8.2* 8.0* 8.9*  HCT 28.3* 23.5*   < > 20.9* 25.4* 24.9* 24.6* 28.1*  MCV 95.6 99.2  --  98.1 98.8 96.9 98.8 99.3  PLT 392 308  --  261 243 255 276 352   < > = values in this interval not displayed.    Basic Metabolic Panel: Recent Labs  Lab 07/11/20 0423 07/12/20 0504 07/13/20 0532 07/14/20 0554 07/15/20 0406 07/16/20 0330  NA 142 146* 148* 152* 149* 151*  K 4.6 3.7 3.6 3.1* 3.4* 3.8  CL 114* 119* 120* 122* 123* 119*  CO2 21* _0 GLUCOSE 145* 194* 152* 125* 125* 118*  BUN 68* 33* 24* _1 CREATININE 1.90* 0.91 0.78 0.64 0.64 0.63  CALCIUM 7.6* 7.9* 7.6* 7.4* 7.4* 7.9*  MG 2.6* 2.1 1.8  --  1.6* 2.0  PHOS 6.8* 2.2* 1.7*  --  1.7* 3.0   GFR: Estimated Creatinine Clearance: 71.6 mL/min (by C-G formula based on SCr of 0.63 mg/dL). Recent Labs  Lab 07/09/20 0850 07/10/20 0439 07/10/20 1630 07/10/20 2020 07/11/20 0423 07/12/20 0504 07/13/20 1618 07/14/20 0554 07/15/20 0406 07/16/20 0330  PROCALCITON 0.22 1.18  --   --  3.00  --   --   --   --   --   WBC 51.0* 88.9*  --   --  82.0*   < > 16.8* 13.8* 12.0* 13.3*  LATICACIDVEN  --   --  1.8 1.6  --   --   --   --   --   --    < > = values in this interval not displayed.    Liver Function Tests: Recent Labs  Lab 07/11/20 0423  ALBUMIN 2.1*   No  results for input(s): LIPASE, AMYLASE in the last 168 hours. No results for input(s): AMMONIA in the last 168 hours.  ABG    Component Value Date/Time   PHART 7.44 07/14/2020 0247   PCO2ART 41 07/14/2020 0247   PO2ART 70 (L) 07/14/2020 0247   HCO3 27.8 07/14/2020 0247   ACIDBASEDEF 0.2 07/10/2020 0647   O2SAT 94.4 07/14/2020 0247     Coagulation Profile: No results for input(s): INR, PROTIME in the last 168 hours.  Cardiac Enzymes: No results for input(s): CKTOTAL, CKMB, CKMBINDEX, TROPONINI in the last 168 hours.  HbA1C: Hgb A1c MFr Bld  Date/Time Value Ref Range Status  06/22/2020 01:25 PM 5.8 (H) 4.8 - 5.6 % Final  Comment:    (NOTE)         Prediabetes: 5.7 - 6.4         Diabetes: >6.4         Glycemic control for adults with diabetes: <7.0   03/11/2013 12:00 AM 5.8 4.0 - 6.0 % Final    CBG: Recent Labs  Lab 07/15/20 1115 07/15/20 1630 07/15/20 1952 07/15/20 2332 07/16/20 0308  GLUCAP 116* 121* 106* 108* 110*    Allergies Allergies  Allergen Reactions   Penicillins Rash    Has patient had a PCN reaction causing immediate rash, facial/tongue/throat swelling, SOB or lightheadedness with hypotension: Yes Has patient had a PCN reaction causing severe rash involving mucus membranes or skin necrosis: No Has patient had a PCN reaction that required hospitalization: No Has patient had a PCN reaction occurring within the last 10 years: No If all of the above answers are "NO", then may proceed with Cephalosporin use.       DVT/GI PRX  assessed I Assessed the need for Labs I Assessed the need for Foley I Assessed the need for Central Venous Line Family Discussion when available I Assessed the need for Mobilization I made an Assessment of medications to be adjusted accordingly Safety Risk assessment completed  CASE DISCUSSED IN MULTIDISCIPLINARY ROUNDS WITH ICU TEAM     Critical Care Time devoted to patient care services described in this note is 55  minutes.  Critical care was necessary to treat or prevent imminent or life-threatening deterioration.   PATIENT WITH VERY POOR PROGNOSIS I ANTICIPATE PROLONGED ICU LOS  Patient with Multiorgan failure and at high risk for cardiac arrest and death.    Corrin Parker, M.D.  [facility]  Medical Director [facility] Director [facility] Cardio-Pulmonary Department

## 2020-07-16 NOTE — Consult Note (Signed)
PHARMACY CONSULT NOTE - FOLLOW UP  Pharmacy Consult for Electrolyte Monitoring and Replacement   Recent Labs: Potassium (mmol/L)  Date Value  07/16/2020 3.8  04/12/2014 3.4 (L)   Magnesium (mg/dL)  Date Value  07/16/2020 2.0  04/12/2014 1.4 (L)   Calcium (mg/dL)  Date Value  07/16/2020 7.9 (L)   Calcium, Total (mg/dL)  Date Value  04/12/2014 7.8 (L)   Albumin (g/dL)  Date Value  07/11/2020 2.1 (L)  01/12/2017 4.8  04/12/2014 3.0 (L)   Phosphorus (mg/dL)  Date Value  07/16/2020 3.0   Sodium (mmol/L)  Date Value  07/16/2020 151 (H)  01/12/2017 130 (L)  04/12/2014 136   Corrected Ca: 8.9 mg/dL  Assessment: 65 year old with history of COPD, HTN, presenting with AMS and acute on chronic respiratory failure. Pt was intubated on 6/23, extubated 6/27 and subsequently reintubated 7/1. Patient has been treated for pneumonia s/t MRSA and pseudomonas.  Pharmacy consulted to monitor and replenish electrolytes while in CCU.  Nutrition:  Osmolite @ 45 mL/hr  PROSource TF 45 mL BID  Free Water 200 mL q4h   Goal of Therapy: Electrolytes wnl  Plan: No electrolyte replacement warranted today Na 151 - on free water, continue to follow trend Monitor electrolytes with morning labs   Tawnya Crook, PharmD

## 2020-07-17 LAB — CBC WITH DIFFERENTIAL/PLATELET
Abs Immature Granulocytes: 0.05 10*3/uL (ref 0.00–0.07)
Basophils Absolute: 0 10*3/uL (ref 0.0–0.1)
Basophils Relative: 0 %
Eosinophils Absolute: 0.2 10*3/uL (ref 0.0–0.5)
Eosinophils Relative: 2 %
HCT: 27.5 % — ABNORMAL LOW (ref 36.0–46.0)
Hemoglobin: 8.6 g/dL — ABNORMAL LOW (ref 12.0–15.0)
Immature Granulocytes: 1 %
Lymphocytes Relative: 18 %
Lymphs Abs: 2 10*3/uL (ref 0.7–4.0)
MCH: 32 pg (ref 26.0–34.0)
MCHC: 31.3 g/dL (ref 30.0–36.0)
MCV: 102.2 fL — ABNORMAL HIGH (ref 80.0–100.0)
Monocytes Absolute: 0.8 10*3/uL (ref 0.1–1.0)
Monocytes Relative: 7 %
Neutro Abs: 7.9 10*3/uL — ABNORMAL HIGH (ref 1.7–7.7)
Neutrophils Relative %: 72 %
Platelets: 383 10*3/uL (ref 150–400)
RBC: 2.69 MIL/uL — ABNORMAL LOW (ref 3.87–5.11)
RDW: 16.6 % — ABNORMAL HIGH (ref 11.5–15.5)
WBC: 11 10*3/uL — ABNORMAL HIGH (ref 4.0–10.5)
nRBC: 0 % (ref 0.0–0.2)

## 2020-07-17 LAB — GLUCOSE, CAPILLARY
Glucose-Capillary: 113 mg/dL — ABNORMAL HIGH (ref 70–99)
Glucose-Capillary: 105 mg/dL — ABNORMAL HIGH (ref 70–99)
Glucose-Capillary: 116 mg/dL — ABNORMAL HIGH (ref 70–99)
Glucose-Capillary: 113 mg/dL — ABNORMAL HIGH (ref 70–99)
Glucose-Capillary: 136 mg/dL — ABNORMAL HIGH (ref 70–99)
Glucose-Capillary: 141 mg/dL — ABNORMAL HIGH (ref 70–99)
Glucose-Capillary: 127 mg/dL — ABNORMAL HIGH (ref 70–99)

## 2020-07-17 LAB — BASIC METABOLIC PANEL
Anion gap: 3 — ABNORMAL LOW (ref 5–15)
BUN: 15 mg/dL (ref 8–23)
CO2: 28 mmol/L (ref 22–32)
Calcium: 8 mg/dL — ABNORMAL LOW (ref 8.9–10.3)
Chloride: 120 mmol/L — ABNORMAL HIGH (ref 98–111)
Creatinine, Ser: 0.54 mg/dL (ref 0.44–1.00)
GFR, Estimated: >60 mL/min (ref >60)
Glucose, Bld: 125 mg/dL — ABNORMAL HIGH (ref 70–99)
Potassium: 3.8 mmol/L (ref 3.5–5.1)
Sodium: 151 mmol/L — ABNORMAL HIGH (ref 135–145)

## 2020-07-17 MED ORDER — GLYCOPYRROLATE 0.2 MG/ML IJ SOLN
0.2000 mg | Freq: Once | INTRAMUSCULAR | Status: AC
  Administered 2020-07-17: 0.2 mg via INTRAVENOUS
  Filled 2020-07-17: qty 1

## 2020-07-17 MED ORDER — ALBUTEROL SULFATE (2.5 MG/3ML) 0.083% IN NEBU: 2.5000 mg | INHALATION_SOLUTION | RESPIRATORY_TRACT | Status: DC | PRN

## 2020-07-17 MED ORDER — IPRATROPIUM-ALBUTEROL 0.5-2.5 (3) MG/3ML IN SOLN
3.0000 mL | Freq: Four times a day (QID) | RESPIRATORY_TRACT | Status: DC
  Administered 2020-07-17 – 2020-07-20 (×12): 3 mL via RESPIRATORY_TRACT
  Filled 2020-07-17 (×11): qty 3

## 2020-07-17 MED ORDER — MIDAZOLAM HCL 2 MG/2ML IJ SOLN
2.0000 mg | INTRAMUSCULAR | Status: DC | PRN
  Administered 2020-07-17: 4 mg via INTRAVENOUS
  Administered 2020-07-17: 2 mg via INTRAVENOUS
  Administered 2020-07-17: 4 mg via INTRAVENOUS
  Administered 2020-07-18 (×3): 2 mg via INTRAVENOUS
  Administered 2020-07-18: 4 mg via INTRAVENOUS
  Administered 2020-07-18: 2 mg via INTRAVENOUS
  Administered 2020-07-18: 4 mg via INTRAVENOUS
  Administered 2020-07-19 – 2020-07-20 (×4): 2 mg via INTRAVENOUS
  Filled 2020-07-17 (×3): qty 2
  Filled 2020-07-17: qty 4
  Filled 2020-07-17 (×2): qty 2
  Filled 2020-07-17 (×2): qty 4
  Filled 2020-07-17 (×2): qty 2
  Filled 2020-07-17 (×2): qty 4
  Filled 2020-07-17 (×2): qty 2

## 2020-07-17 MED ORDER — FENTANYL CITRATE (PF) 100 MCG/2ML IJ SOLN
50.0000 ug | INTRAMUSCULAR | Status: DC | PRN
Start: 2020-07-17 — End: 2020-07-20
  Administered 2020-07-17 – 2020-07-19 (×12): 100 ug via INTRAVENOUS
  Filled 2020-07-17 (×12): qty 2

## 2020-07-17 NOTE — Progress Notes (Signed)
Pt care assumed. No changes made to vent support. Secretions remain moderate. Sedation gtts infusing. Will continue to follow and proceed as ordered.

## 2020-07-17 NOTE — Progress Notes (Signed)
NAME:  CLARICE ZULAUF, MRN:  [id number], DOB:  07-12-1955, LOS: 23 ADMISSION DATE:  06/30/2020  CHIEF COMPLAINT:  follow up severe COPD and resp failure  BRIEF SYNOPSIS  65 year old with history of COPD, depression, hypertension, panic attacks presenting with altered mental status, acute on chronic respiratory failure.  Patient had a fall a few days ago, seen at local urgent care.  Has been taking increasing doses of opiate.  In the ED she failed Narcan and BiPAP and got intubated.  PCCM consulted for admission, para pneumonic effusion s/p chest placement and removal, DX of MRSA and PSEUDOMNAL PNEUMONIA, complicated by delirium and catatonia Failed trial of extubation, re intubated and failure to wean from vent   Pertinent  Medical History     has a past medical history of Anxiety, Chronic lower back pain, COPD (chronic obstructive pulmonary disease) ([place]), Depression, Hypertension, Panic disorder with agoraphobia and severe panic attacks, and Peripheral sensory neuropathy.    Micro Data:  06/25/2020: SARS-CoV-2 and influenza PCR>> negative 06/21/2020: HIV>> nonreactive 06/25/2020: Strep pneumo urinary antigen>> negative 06/13/2020: Legionella urinary antigen>>negative 07/07/2020: Pleural fluid>> no growth 06/13/2020: Blood culture>> no growth 06/24/2020: MRSA PCR>> positive 06/19/2020: Tracheal aspirate>> Pseudomonas Aeruginosa & MRSA 06/16/2020: Urine>> no growth 07/10/2020: Stool culture (GI panel)>> no growth 07/10/2020: C. difficile PCR: Negative   Antimicrobials:  Cefepime 6/23>> 7/1 Ceftriaxone 6/24>> 6/25 Flagyl 6/23>> 6/27 Vancomycin 6/23>> 6/27; re-started 7/1>> Linezolid 6/27>>7/1 PO Vancomycin 6/30>>7/02 Ceftazidime 7/1>>   Significant Hospital Events: Including procedures, antibiotic start and stop dates in addition to other pertinent events  6/23-admit, intubated, right chest tube placed, right IJ CVC placed 6/24: Weaning vent settings and Levophed (down to 8 mcg). Na+  corrected to 124 this morning (108 on admission) ~ D5 @ 75 started. Tracheal aspirate with Gram+ cocci in pairs & rare gram + rods ~ Cefepime changed to Ceftriaxone, Azithromycin d/c.  Pleural fluid consistent with EXUDATE ~ pleural fluid culture pending 6/26 remains on vent, critically ill 6/27 Successfully extubated 6/28: Chest tube removed, remains with AMS, CT Head negative 6/29: Remains altered, concern for ? Catatonia vs. ? Osmotic Demyelination Syndrome (serum Na+ noted to have rapidly corrected during 1st 24 hrs of admission), Obtain MRI Brain,  Psych consulted 6/30: MRI Brain yesterday normal, tracking today (intermittently follows commands), obtain Neurology consult. WBC increased to 51 (23.6) ~ Peripheral Smear Normal, steroids d/c, consider Hematology consult; get CTA Chest and CT Abdomen/Pelvis 7/1: Overnight with hypoxia and respiratory distress, required intubation.  ABX changed to Ceftazidime and Vancomycin.  Hematology consulted for severe Leukocytosis.  EEG pending 7/2: Remains vent dependent, FiO2 down to 55%.  Chest x-ray improved with recruitment maneuvers 7/3: No ventilator asynchrony, heavily sedated, IV sedatives being titrated off as tolerated, not ready for SBT 7/4 severe resp failure, remains on vent 7/5 severe vent dyssynchrony, severe COPD, family made patient DNR 7/6 son askled to call [facility] and [facility] for second opnion, No there recs, will not accept patient 07/17/20- patient with SBT today and was initially doing ok but hen failed trial after 2 hours.  I spoke with son Mr Shepperson and updated on care plan.          Antimicrobials:   Antibiotics Given (last 72 hours)     None            Interim History / Subjective:  Remains on vent Severe resp distress +COPD       Objective   Blood pressure 126/81, pulse (!) 119, temperature 98.96  F (37.2 C), temperature source Esophageal, resp. rate 18, height 5' 5"  (1.651 m), weight 73.4 kg, SpO2 100 %.    Vent  Mode: PRVC FiO2 (%):  [28 %-35 %] 30 % Set Rate:  [16 bmp] 16 bmp Vt Set:  [400 mL] 400 mL PEEP:  [5 cmH20] 5 cmH20 Plateau Pressure:  [13 cmH20] 13 cmH20   Intake/Output Summary (Last 24 hours) at 07/17/2020 2992 Last data filed at 07/17/2020 0501 Gross per 24 hour  Intake 3208 ml  Output 2150 ml  Net 1058 ml    Filed Weights   07/12/20 0500 07/16/20 0338 07/17/20 0404  Weight: 65.8 kg 72.1 kg 73.4 kg      REVIEW OF SYSTEMS  PATIENT IS UNABLE TO PROVIDE COMPLETE REVIEW OF SYSTEMS DUE TO SEVERE CRITICAL ILLNESS AND TOXIC METABOLIC ENCEPHALOPATHY   PHYSICAL EXAMINATION:  GENERAL:critically ill appearing, +resp distress HEAD: Normocephalic, atraumatic.  EYES: Pupils equal, round, reactive to light.  No scleral icterus.  MOUTH: Moist mucosal membrane. NECK: Supple. PULMONARY: +rhonchi, +wheezing CARDIOVASCULAR: S1 and S2. Regular rate and rhythm. No murmurs, rubs, or gallops.  GASTROINTESTINAL: Soft, nontender, -distended. Positive bowel sounds.  MUSCULOSKELETAL: No swelling, clubbing, or edema.  NEUROLOGIC: obtunded SKIN:intact,warm,dry   Labs/imaging that I havepersonally reviewed  (right click and "Reselect all SmartList Selections" daily)     ASSESSMENT AND PLAN    Admitted for Severe ACUTE Hypoxic and Hypercapnic Respiratory Failure Acute on chronic respiratory failure due to COPD exacerbation, right lower lobe pneumonia (Pseudomonas & MRSA), & large Right Pleural Effusion    -07/17/20- failed SBT, family updated    Severe ACUTE Hypoxic and Hypercapnic Respiratory Failure -continue Mechanical Ventilator support -continue Bronchodilator Therapy -Wean Fio2 and PEEP as tolerated -VAP/VENT bundle implementation -will perform SAT/SBT when respiratory parameters are met    Vent Mode: PRVC FiO2 (%):  [28 %-35 %] 30 % Set Rate:  [16 bmp] 16 bmp Vt Set:  [400 mL] 400 mL PEEP:  [5 cmH20] 5 cmH20 Plateau Pressure:  [13 cmH20] 13 cmH20   SEVERE COPD  EXACERBATION -continue IV steroids as prescribed -continue NEB THERAPY as prescribed -morphine as needed -wean fio2 as needed and tolerated   SEVERE BENZO  WITHDRAWAL -Precedex as needed   CARDIAC ICU monitoring   ACUTE KIDNEY INJURY-resolved  -monitor UOP -minimize non-essential nephrotoxins Intake/Output Summary (Last 24 hours) at 07/17/2020 0903 Last data filed at 07/17/2020 0501 Gross per 24 hour  Intake 3208 ml  Output 2150 ml  Net 1058 ml    I/O last 3 completed shifts: In: 4168.6 [I.V.:983.6; NG/GT:3185] Out: 4125 [Urine:3125; Stool:1000] No intake/output data recorded.    NEUROLOGY Acute toxic metabolic encephalopathy, need for sedation Goal RASS -2 to -3  Severe BENZO withdrawal Follow up [place] and PSEUDOMONAL pneumonia -use vasopressors to keep MAP>65 as needed  INFECTIOUS DISEASE -continue antibiotics as prescribed -follow up cultures   ENDO - ICU hypoglycemic\Hyperglycemia protocol -check FSBS per protocol   GI GI PROPHYLAXIS as indicated  NUTRITIONAL STATUS DIET-->TF's as tolerated Constipation protocol as indicated   ELECTROLYTES -follow labs as needed -replace as needed -pharmacy consultation and following      Best Practice (right click and "Reselect all SmartList Selections" daily)    Diet/type: Begin tube feeds 7/2 Pain/Anxiety/Delirium protocol yes VAP protocol (if indicated): yes DVT prophylaxis: prophylactic heparin GI prophylaxis: PPI Glucose control:  yes, SSI Central venous access:  yes, and is still indicated Arterial line:  N/A Foley:  N/A Mobility:  bed  rest  PT consulted: N/A Studies pending: EEG Culture data pending: N/A Last reviewed culture data:today Antibiotics: Ceftazidime, Vancomycin, p.o. vancomycin DC'd 7/2 Flagyl DC'd 7/2 Antibiotic de-escalation:  N/A, on appropriate ABX Stop date: to be determined enthesis 14 days total) Daily labs: requested Code  Status:  full code Disposition: remains critically ill, will stay in intensive care    Labs   CBC: Recent Labs  Lab 07/12/20 0504 07/12/20 1416 07/13/20 1618 07/14/20 0554 07/15/20 0406 07/16/20 0330 07/17/20 0404  WBC 42.8*   < > 16.8* 13.8* 12.0* 13.3* 11.0*  NEUTROABS 38.6*  --   --  10.9* 9.0* 10.0* 7.9*  HGB 7.6*   < > 8.1* 8.2* 8.0* 8.9* 8.6*  HCT 23.5*   < > 25.4* 24.9* 24.6* 28.1* 27.5*  MCV 99.2   < > 98.8 96.9 98.8 99.3 102.2*  PLT 308   < > 243 255 276 352 383   < > = values in this interval not displayed.     Basic Metabolic Panel: Recent Labs  Lab 07/11/20 0423 07/12/20 0504 07/13/20 0532 07/14/20 0554 07/15/20 0406 07/16/20 0330 07/17/20 0404  NA 142 146* 148* 152* 149* 151* 151*  K 4.6 3.7 3.6 3.1* 3.4* 3.8 3.8  CL 114* 119* 120* 122* 123* 119* 120*  CO2 21* 24 24 26 25 25 28   GLUCOSE 145* 194* 152* 125* 125* 118* 125*  BUN 68* 33* 24* 23 22 19 15   CREATININE 1.90* 0.91 0.78 0.64 0.64 0.63 0.54  CALCIUM 7.6* 7.9* 7.6* 7.4* 7.4* 7.9* 8.0*  MG 2.6* 2.1 1.8  --  1.6* 2.0  --   PHOS 6.8* 2.2* 1.7*  --  1.7* 3.0  --     GFR: Estimated Creatinine Clearance: 72.3 mL/min (by C-G formula based on SCr of 0.54 mg/dL). Recent Labs  Lab 07/10/20 1630 07/10/20 2020 07/11/20 0423 07/12/20 0504 07/14/20 0554 07/15/20 0406 07/16/20 0330 07/17/20 0404  PROCALCITON  --   --  3.00  --   --   --   --   --   WBC  --   --  82.0*   < > 13.8* 12.0* 13.3* 11.0*  LATICACIDVEN 1.8 1.6  --   --   --   --   --   --    < > = values in this interval not displayed.     Liver Function Tests: Recent Labs  Lab 07/11/20 0423  ALBUMIN 2.1*    No results for input(s): LIPASE, AMYLASE in the last 168 hours. No results for input(s): AMMONIA in the last 168 hours.  ABG    Component Value Date/Time   PHART 7.44 07/14/2020 0247   PCO2ART 41 07/14/2020 0247   PO2ART 70 (L) 07/14/2020 0247   HCO3 27.8 07/14/2020 0247   ACIDBASEDEF 0.2 07/10/2020 0647   O2SAT 94.4  07/14/2020 0247      Coagulation Profile: No results for input(s): INR, PROTIME in the last 168 hours.  Cardiac Enzymes: No results for input(s): CKTOTAL, CKMB, CKMBINDEX, TROPONINI in the last 168 hours.  HbA1C: Hgb A1c MFr Bld  Date/Time Value Ref Range Status  07/04/2020 01:25 PM 5.8 (H) 4.8 - 5.6 % Final    Comment:    (NOTE)         Prediabetes: 5.7 - 6.4         Diabetes: >6.4         Glycemic control for adults with diabetes: <7.0   03/11/2013 12:00 AM 5.8 4.0 -  6.0 % Final    CBG: Recent Labs  Lab 07/16/20 1913 07/16/20 2326 07/17/20 0308 07/17/20 0407 07/17/20 0740  GLUCAP 129* 154* 113* 105* 116*     Allergies Allergies  Allergen Reactions   Penicillins Rash    Has patient had a PCN reaction causing immediate rash, facial/tongue/throat swelling, SOB or lightheadedness with hypotension: Yes Has patient had a PCN reaction causing severe rash involving mucus membranes or skin necrosis: No Has patient had a PCN reaction that required hospitalization: No Has patient had a PCN reaction occurring within the last 10 years: No If all of the above answers are "NO", then may proceed with Cephalosporin use.        DVT/GI PRX  assessed I Assessed the need for Labs I Assessed the need for Foley I Assessed the need for Central Venous Line Family Discussion when available I Assessed the need for Mobilization I made an Assessment of medications to be adjusted accordingly Safety Risk assessment completed  CASE DISCUSSED IN MULTIDISCIPLINARY ROUNDS WITH ICU TEAM     Critical Care Time devoted to patient care services described in this note is 109 minutes.  Critical care was necessary to treat or prevent imminent or life-threatening deterioration.     Patient with Multiorgan failure and at high risk for cardiac arrest and death.    Ottie Glazier, M.D.  [facility] [place]

## 2020-07-17 NOTE — Consult Note (Signed)
[place] for Electrolyte Monitoring and Replacement   Recent Labs: Potassium (mmol/L)  Date Value  07/17/2020 3.8  04/12/2014 3.4 (L)   Magnesium (mg/dL)  Date Value  07/16/2020 2.0  04/12/2014 1.4 (L)   Calcium (mg/dL)  Date Value  07/17/2020 8.0 (L)   Calcium, Total (mg/dL)  Date Value  04/12/2014 7.8 (L)   Albumin (g/dL)  Date Value  07/11/2020 2.1 (L)  01/12/2017 4.8  04/12/2014 3.0 (L)   Phosphorus (mg/dL)  Date Value  07/16/2020 3.0   Sodium (mmol/L)  Date Value  07/17/2020 151 (H)  01/12/2017 130 (L)  04/12/2014 136    Assessment: 65 year old with history of COPD, HTN, presenting with AMS and acute on chronic respiratory failure. Pt was intubated on 6/23, extubated 6/27 and subsequently reintubated 7/1. Patient has been treated for pneumonia s/t MRSA and pseudomonas.  Pharmacy consulted to monitor and replenish electrolytes while in CCU.  Nutrition:  Osmolite @ 45 mL/hr  PROSource TF 45 mL BID  Free Water 200 mL q4h   Goal of Therapy: Electrolytes within normal limits  Plan: No electrolyte replacement warranted today Na 151 - stable. On free water, continue to follow trend Monitor electrolytes with morning labs   Benita Gutter 07/17/20

## 2020-07-18 ENCOUNTER — Inpatient Hospital Stay: Payer: Medicare Other

## 2020-07-18 LAB — BASIC METABOLIC PANEL
Anion gap: 5 (ref 5–15)
BUN: 13 mg/dL (ref 8–23)
CO2: 28 mmol/L (ref 22–32)
Calcium: 7.9 mg/dL — ABNORMAL LOW (ref 8.9–10.3)
Chloride: 115 mmol/L — ABNORMAL HIGH (ref 98–111)
Creatinine, Ser: 0.6 mg/dL (ref 0.44–1.00)
GFR, Estimated: >60 mL/min (ref >60)
Glucose, Bld: 164 mg/dL — ABNORMAL HIGH (ref 70–99)
Potassium: 3.6 mmol/L (ref 3.5–5.1)
Sodium: 148 mmol/L — ABNORMAL HIGH (ref 135–145)

## 2020-07-18 LAB — BLOOD GAS, ARTERIAL
Acid-Base Excess: 5.3 mmol/L — ABNORMAL HIGH (ref 0.0–2.0)
Allens test (pass/fail): POSITIVE — AB
Bicarbonate: 30.5 mmol/L — ABNORMAL HIGH (ref 20.0–28.0)
FIO2: 0.5
MECHVT: 400 mL
O2 Saturation: 97.9 %
PEEP: 5 cmH2O
Patient temperature: 37
RATE: 16 resp/min
pCO2 arterial: 47 mmHg (ref 32.0–48.0)
pH, Arterial: 7.42 (ref 7.350–7.450)
pO2, Arterial: 100 mmHg (ref 83.0–108.0)

## 2020-07-18 LAB — CBC WITH DIFFERENTIAL/PLATELET
Abs Immature Granulocytes: 0.09 10*3/uL — ABNORMAL HIGH (ref 0.00–0.07)
Basophils Absolute: 0.1 10*3/uL (ref 0.0–0.1)
Basophils Relative: 0 %
Eosinophils Absolute: 0 10*3/uL (ref 0.0–0.5)
Eosinophils Relative: 0 %
HCT: 31 % — ABNORMAL LOW (ref 36.0–46.0)
Hemoglobin: 9.5 g/dL — ABNORMAL LOW (ref 12.0–15.0)
Immature Granulocytes: 1 %
Lymphocytes Relative: 5 %
Lymphs Abs: 1 10*3/uL (ref 0.7–4.0)
MCH: 31.1 pg (ref 26.0–34.0)
MCHC: 30.6 g/dL (ref 30.0–36.0)
MCV: 101.6 fL — ABNORMAL HIGH (ref 80.0–100.0)
Monocytes Absolute: 0.8 10*3/uL (ref 0.1–1.0)
Monocytes Relative: 4 %
Neutro Abs: 17.8 10*3/uL — ABNORMAL HIGH (ref 1.7–7.7)
Neutrophils Relative %: 90 %
Platelets: 465 10*3/uL — ABNORMAL HIGH (ref 150–400)
RBC: 3.05 MIL/uL — ABNORMAL LOW (ref 3.87–5.11)
RDW: 16.8 % — ABNORMAL HIGH (ref 11.5–15.5)
WBC: 19.8 10*3/uL — ABNORMAL HIGH (ref 4.0–10.5)
nRBC: 0 % (ref 0.0–0.2)

## 2020-07-18 LAB — GLUCOSE, CAPILLARY
Glucose-Capillary: 161 mg/dL — ABNORMAL HIGH (ref 70–99)
Glucose-Capillary: 161 mg/dL — ABNORMAL HIGH (ref 70–99)
Glucose-Capillary: 118 mg/dL — ABNORMAL HIGH (ref 70–99)
Glucose-Capillary: 97 mg/dL (ref 70–99)
Glucose-Capillary: 101 mg/dL — ABNORMAL HIGH (ref 70–99)
Glucose-Capillary: 124 mg/dL — ABNORMAL HIGH (ref 70–99)

## 2020-07-18 LAB — PHOSPHORUS: Phosphorus: 3 mg/dL (ref 2.5–4.6)

## 2020-07-18 LAB — PROCALCITONIN: Procalcitonin: 0.18 ng/mL

## 2020-07-18 LAB — MAGNESIUM: Magnesium: 1.9 mg/dL (ref 1.7–2.4)

## 2020-07-18 MED ORDER — VECURONIUM BROMIDE 10 MG IV SOLR
INTRAVENOUS | Status: AC
  Administered 2020-07-18: 10 mg via INTRAVENOUS
  Filled 2020-07-18: qty 10

## 2020-07-18 MED ORDER — MAGNESIUM SULFATE 2 GM/50ML IV SOLN
2.0000 g | Freq: Once | INTRAVENOUS | Status: AC
  Administered 2020-07-18: 2 g via INTRAVENOUS
  Filled 2020-07-18: qty 50

## 2020-07-18 MED ORDER — VECURONIUM BROMIDE 10 MG IV SOLR: 10.0000 mg | Freq: Once | INTRAVENOUS | Status: AC

## 2020-07-18 MED ORDER — NOREPINEPHRINE 4 MG/250ML-% IV SOLN
INTRAVENOUS | Status: AC
  Filled 2020-07-18: qty 250

## 2020-07-18 MED ORDER — POTASSIUM CHLORIDE 20 MEQ PO PACK
40.0000 meq | PACK | Freq: Once | ORAL | Status: AC
Start: 2020-07-18 — End: 2020-07-18
  Administered 2020-07-18: 40 meq
  Filled 2020-07-18: qty 2

## 2020-07-18 MED ORDER — MIDAZOLAM HCL 2 MG/2ML IJ SOLN
4.0000 mg | Freq: Once | INTRAMUSCULAR | Status: AC
  Administered 2020-07-18: 4 mg via INTRAVENOUS

## 2020-07-18 MED ORDER — NOREPINEPHRINE 4 MG/250ML-% IV SOLN
0.0000 ug/min | INTRAVENOUS | Status: DC
  Administered 2020-07-18 – 2020-07-19 (×2): 2 ug/min via INTRAVENOUS
  Filled 2020-07-18: qty 250

## 2020-07-18 MED ORDER — MIDAZOLAM HCL 2 MG/2ML IJ SOLN
4.0000 mg | Freq: Once | INTRAMUSCULAR | Status: AC
  Administered 2020-07-18: 4 mg via INTRAVENOUS
  Filled 2020-07-18: qty 4

## 2020-07-18 MED ORDER — PROPOFOL 1000 MG/100ML IV EMUL
5.0000 ug/kg/min | INTRAVENOUS | Status: DC
  Administered 2020-07-18: 5 ug/kg/min via INTRAVENOUS
  Administered 2020-07-18 – 2020-07-19 (×6): 65 ug/kg/min via INTRAVENOUS
  Administered 2020-07-19 – 2020-07-20 (×5): 50 ug/kg/min via INTRAVENOUS
  Filled 2020-07-18 (×13): qty 100

## 2020-07-18 NOTE — Consult Note (Signed)
[place] for Electrolyte Monitoring and Replacement   Recent Labs: Potassium (mmol/L)  Date Value  07/18/2020 3.6  04/12/2014 3.4 (L)   Magnesium (mg/dL)  Date Value  07/18/2020 1.9  04/12/2014 1.4 (L)   Calcium (mg/dL)  Date Value  07/18/2020 7.9 (L)   Calcium, Total (mg/dL)  Date Value  04/12/2014 7.8 (L)   Albumin (g/dL)  Date Value  07/11/2020 2.1 (L)  01/12/2017 4.8  04/12/2014 3.0 (L)   Phosphorus (mg/dL)  Date Value  07/18/2020 3.0   Sodium (mmol/L)  Date Value  07/18/2020 148 (H)  01/12/2017 130 (L)  04/12/2014 136    Assessment: 65 year old with history of COPD, HTN, presenting with AMS and acute on chronic respiratory failure. Pt was intubated on 6/23, extubated 6/27 and subsequently reintubated 7/1. Patient has been treated for pneumonia s/t MRSA and pseudomonas.  Pharmacy consulted to monitor and replenish electrolytes while in CCU.  Nutrition:  Osmolite @ 45 mL/hr  PROSource TF 45 mL BID  Free Water 200 mL q4h   Goal of Therapy: Electrolytes within normal limits  Plan: No electrolyte replacement warranted today Na 148. On free water, continue to follow trend Monitor electrolytes as ordered per team   Tawnya Crook, PharmD 07/18/20

## 2020-07-18 NOTE — Progress Notes (Signed)
K 3.6, Mg 1.9 Electrolytes replaced per [facility] electrolyte replacement protocol

## 2020-07-18 NOTE — Progress Notes (Signed)
NAME:  Tamara Mcclure, MRN:  [id number], DOB:  1955/11/26, LOS: 41 ADMISSION DATE:  06/27/2020  CHIEF COMPLAINT:  follow up severe COPD and resp failure  BRIEF SYNOPSIS  65 year old with history of COPD, depression, hypertension, panic attacks presenting with altered mental status, acute on chronic respiratory failure.  Patient had a fall a few days ago, seen at local urgent care.  Has been taking increasing doses of opiate.  In the ED she failed Narcan and BiPAP and got intubated.  PCCM consulted for admission, para pneumonic effusion s/p chest placement and removal, DX of MRSA and PSEUDOMNAL PNEUMONIA, complicated by delirium and catatonia Failed trial of extubation, re intubated and failure to wean from vent   Pertinent  Medical History     has a past medical history of Anxiety, Chronic lower back pain, COPD (chronic obstructive pulmonary disease) ([place]), Depression, Hypertension, Panic disorder with agoraphobia and severe panic attacks, and Peripheral sensory neuropathy.    Micro Data:  06/11/2020: SARS-CoV-2 and influenza PCR>> negative 07/06/2020: HIV>> nonreactive 07/08/2020: Strep pneumo urinary antigen>> negative 06/22/2020: Legionella urinary antigen>>negative 07/07/2020: Pleural fluid>> no growth 06/11/2020: Blood culture>> no growth 06/11/2020: MRSA PCR>> positive 06/15/2020: Tracheal aspirate>> Pseudomonas Aeruginosa & MRSA 06/20/2020: Urine>> no growth 07/10/2020: Stool culture (GI panel)>> no growth 07/10/2020: C. difficile PCR: Negative   Antimicrobials:  Cefepime 6/23>> 7/1 Ceftriaxone 6/24>> 6/25 Flagyl 6/23>> 6/27 Vancomycin 6/23>> 6/27; re-started 7/1>> Linezolid 6/27>>7/1 PO Vancomycin 6/30>>7/02 Ceftazidime 7/1>>   Significant Hospital Events: Including procedures, antibiotic start and stop dates in addition to other pertinent events  6/23-admit, intubated, right chest tube placed, right IJ CVC placed 6/24: Weaning vent settings and Levophed (down to 8 mcg). Na+  corrected to 124 this morning (108 on admission) ~ D5 @ 75 started. Tracheal aspirate with Gram+ cocci in pairs & rare gram + rods ~ Cefepime changed to Ceftriaxone, Azithromycin d/c.  Pleural fluid consistent with EXUDATE ~ pleural fluid culture pending 6/26 remains on vent, critically ill 6/27 Successfully extubated 6/28: Chest tube removed, remains with AMS, CT Head negative 6/29: Remains altered, concern for ? Catatonia vs. ? Osmotic Demyelination Syndrome (serum Na+ noted to have rapidly corrected during 1st 24 hrs of admission), Obtain MRI Brain,  Psych consulted 6/30: MRI Brain yesterday normal, tracking today (intermittently follows commands), obtain Neurology consult. WBC increased to 51 (23.6) ~ Peripheral Smear Normal, steroids d/c, consider Hematology consult; get CTA Chest and CT Abdomen/Pelvis 7/1: Overnight with hypoxia and respiratory distress, required intubation.  ABX changed to Ceftazidime and Vancomycin.  Hematology consulted for severe Leukocytosis.  EEG pending 7/2: Remains vent dependent, FiO2 down to 55%.  Chest x-ray improved with recruitment maneuvers 7/3: No ventilator asynchrony, heavily sedated, IV sedatives being titrated off as tolerated, not ready for SBT 7/4 severe resp failure, remains on vent 7/5 severe vent dyssynchrony, severe COPD, family made patient DNR 7/6 son askled to call [facility] and [facility] for second opnion, No there recs, will not accept patient 07/17/20- patient with SBT today and was initially doing ok but hen failed trial after 2 hours.  I spoke with son Mr Delisle and updated on care plan.  07/18/20- patient had significant decline overnight.  I met with son to review patietns condition and care plan.          Antimicrobials:   Antibiotics Given (last 72 hours)     None       Interim History / Subjective:  Remains on vent Severe resp distress +COPD   Objective  Blood pressure (!) 87/61, pulse 85, temperature (!) 97.52 F (36.4 C), resp. rate  (!) 24, height 5' 5"  (1.651 m), weight 73.4 kg, SpO2 100 %.    Vent Mode: PRVC FiO2 (%):  [30 %-100 %] 50 % Set Rate:  [16 bmp] 16 bmp Vt Set:  [400 mL] 400 mL PEEP:  [5 cmH20-8 cmH20] 8 cmH20 Plateau Pressure:  [17 cmH20-27 cmH20] 24 cmH20   Intake/Output Summary (Last 24 hours) at 07/18/2020 1316 Last data filed at 07/18/2020 0000 Gross per 24 hour  Intake 2128.28 ml  Output 2400 ml  Net -271.72 ml    Filed Weights   07/12/20 0500 07/16/20 0338 07/17/20 0404  Weight: 65.8 kg 72.1 kg 73.4 kg      REVIEW OF SYSTEMS  PATIENT IS UNABLE TO PROVIDE COMPLETE REVIEW OF SYSTEMS DUE TO SEVERE CRITICAL ILLNESS AND TOXIC METABOLIC ENCEPHALOPATHY   PHYSICAL EXAMINATION:  GENERAL:critically ill appearing, +resp distress HEAD: Normocephalic, atraumatic.  EYES: Pupils equal, round, reactive to light.  No scleral icterus.  MOUTH: Moist mucosal membrane. NECK: Supple. PULMONARY: +rhonchi, +wheezing CARDIOVASCULAR: S1 and S2. Regular rate and rhythm. No murmurs, rubs, or gallops.  GASTROINTESTINAL: Soft, nontender, -distended. Positive bowel sounds.  MUSCULOSKELETAL: No swelling, clubbing, or edema.  NEUROLOGIC: obtunded SKIN:intact,warm,dry   Labs/imaging that I havepersonally reviewed  (right click and "Reselect all SmartList Selections" daily)     ASSESSMENT AND PLAN    Admitted for Severe ACUTE Hypoxic and Hypercapnic Respiratory Failure Acute on chronic respiratory failure due to COPD exacerbation, right lower lobe pneumonia (Pseudomonas & MRSA), & large Right Pleural Effusion    -07/17/20- failed SBT, family updated    Severe ACUTE Hypoxic and Hypercapnic Respiratory Failure -continue Mechanical Ventilator support -continue Bronchodilator Therapy -Wean Fio2 and PEEP as tolerated -VAP/VENT bundle implementation -will perform SAT/SBT when respiratory parameters are met    Vent Mode: PRVC FiO2 (%):  [30 %-100 %] 50 % Set Rate:  [16 bmp] 16 bmp Vt Set:  [400 mL] 400  mL PEEP:  [5 cmH20-8 cmH20] 8 cmH20 Plateau Pressure:  [17 cmH20-27 cmH20] 24 cmH20   SEVERE COPD EXACERBATION -continue IV steroids as prescribed -continue NEB THERAPY as prescribed -morphine as needed -wean fio2 as needed and tolerated   SEVERE BENZO  WITHDRAWAL -Precedex as needed   CARDIAC ICU monitoring   ACUTE KIDNEY INJURY-resolved  -monitor UOP -minimize non-essential nephrotoxins Intake/Output Summary (Last 24 hours) at 07/18/2020 1316 Last data filed at 07/18/2020 0000 Gross per 24 hour  Intake 2128.28 ml  Output 2400 ml  Net -271.72 ml    I/O last 3 completed shifts: In: 2861.6 [I.V.:418.3; NG/GT:2443.3] Out: 2900 [Urine:2800; Stool:100] No intake/output data recorded.    NEUROLOGY Acute toxic metabolic encephalopathy, need for sedation Goal RASS -2 to -3  Severe BENZO withdrawal Follow up [place] and PSEUDOMONAL pneumonia -use vasopressors to keep MAP>65 as needed  INFECTIOUS DISEASE -continue antibiotics as prescribed -follow up cultures   ENDO - ICU hypoglycemic\Hyperglycemia protocol -check FSBS per protocol   GI GI PROPHYLAXIS as indicated  NUTRITIONAL STATUS DIET-->TF's as tolerated Constipation protocol as indicated   ELECTROLYTES -follow labs as needed -replace as needed -pharmacy consultation and following      Best Practice (right click and "Reselect all SmartList Selections" daily)    Diet/type: Begin tube feeds 7/2 Pain/Anxiety/Delirium protocol yes VAP protocol (if indicated): yes DVT prophylaxis: prophylactic heparin GI prophylaxis: PPI Glucose control:  yes, SSI Central venous access:  yes, and is still  indicated Arterial line:  N/A Foley:  N/A Mobility:  bed rest  PT consulted: N/A Studies pending: EEG Culture data pending: N/A Last reviewed culture data:today Antibiotics: Ceftazidime, Vancomycin, p.o. vancomycin DC'd 7/2 Flagyl DC'd 7/2 Antibiotic de-escalation:   N/A, on appropriate ABX Stop date: to be determined enthesis 14 days total) Daily labs: requested Code Status:  full code Disposition: remains critically ill, will stay in intensive care    Labs   CBC: Recent Labs  Lab 07/14/20 0554 07/15/20 0406 07/16/20 0330 07/17/20 0404 07/18/20 0451  WBC 13.8* 12.0* 13.3* 11.0* 19.8*  NEUTROABS 10.9* 9.0* 10.0* 7.9* 17.8*  HGB 8.2* 8.0* 8.9* 8.6* 9.5*  HCT 24.9* 24.6* 28.1* 27.5* 31.0*  MCV 96.9 98.8 99.3 102.2* 101.6*  PLT 255 276 352 383 465*     Basic Metabolic Panel: Recent Labs  Lab 07/12/20 0504 07/13/20 0532 07/14/20 0554 07/15/20 0406 07/16/20 0330 07/17/20 0404 07/18/20 0451  NA 146* 148* 152* 149* 151* 151* 148*  K 3.7 3.6 3.1* 3.4* 3.8 3.8 3.6  CL 119* 120* 122* 123* 119* 120* 115*  CO2 24 24 26 25 25 28 28   GLUCOSE 194* 152* 125* 125* 118* 125* 164*  BUN 33* 24* 23 22 19 15 13   CREATININE 0.91 0.78 0.64 0.64 0.63 0.54 0.60  CALCIUM 7.9* 7.6* 7.4* 7.4* 7.9* 8.0* 7.9*  MG 2.1 1.8  --  1.6* 2.0  --  1.9  PHOS 2.2* 1.7*  --  1.7* 3.0  --  3.0    GFR: Estimated Creatinine Clearance: 72.3 mL/min (by C-G formula based on SCr of 0.6 mg/dL). Recent Labs  Lab 07/15/20 0406 07/16/20 0330 07/17/20 0404 07/18/20 0451  PROCALCITON  --   --   --  0.18  WBC 12.0* 13.3* 11.0* 19.8*     Liver Function Tests: No results for input(s): AST, ALT, ALKPHOS, BILITOT, PROT, ALBUMIN in the last 168 hours.  No results for input(s): LIPASE, AMYLASE in the last 168 hours. No results for input(s): AMMONIA in the last 168 hours.  ABG    Component Value Date/Time   PHART 7.42 07/18/2020 0800   PCO2ART 47 07/18/2020 0800   PO2ART 100 07/18/2020 0800   HCO3 30.5 (H) 07/18/2020 0800   ACIDBASEDEF 0.2 07/10/2020 0647   O2SAT 97.9 07/18/2020 0800      Coagulation Profile: No results for input(s): INR, PROTIME in the last 168 hours.  Cardiac Enzymes: No results for input(s): CKTOTAL, CKMB, CKMBINDEX, TROPONINI in the last  168 hours.  HbA1C: Hgb A1c MFr Bld  Date/Time Value Ref Range Status  06/20/2020 01:25 PM 5.8 (H) 4.8 - 5.6 % Final    Comment:    (NOTE)         Prediabetes: 5.7 - 6.4         Diabetes: >6.4         Glycemic control for adults with diabetes: <7.0   03/11/2013 12:00 AM 5.8 4.0 - 6.0 % Final    CBG: Recent Labs  Lab 07/17/20 1936 07/17/20 2308 07/18/20 0304 07/18/20 0730 07/18/20 1110  GLUCAP 141* 127* 161* 161* 118*     Allergies Allergies  Allergen Reactions   Penicillins Rash    Has patient had a PCN reaction causing immediate rash, facial/tongue/throat swelling, SOB or lightheadedness with hypotension: Yes Has patient had a PCN reaction causing severe rash involving mucus membranes or skin necrosis: No Has patient had a PCN reaction that required hospitalization: No Has patient had a PCN reaction occurring within  the last 10 years: No If all of the above answers are "NO", then may proceed with Cephalosporin use.        DVT/GI PRX  assessed I Assessed the need for Labs I Assessed the need for Foley I Assessed the need for Central Venous Line Family Discussion when available I Assessed the need for Mobilization I made an Assessment of medications to be adjusted accordingly Safety Risk assessment completed  CASE DISCUSSED IN MULTIDISCIPLINARY ROUNDS WITH ICU TEAM     Critical Care Time devoted to patient care services described in this note is 33 minutes.  Critical care was necessary to treat or prevent imminent or life-threatening deterioration.     Patient with Multiorgan failure and at high risk for cardiac arrest and death.    Ottie Glazier, M.D.  [facility]

## 2020-07-18 NOTE — Progress Notes (Addendum)
Severe Acute hypoxic/hypercapnic respiratory failure Acute on Chronic respiratory failure d/t COPD exacerbation, RLL pneumonia & large R pleural effusion Patient has been requiring PRN IVP of fentanyl & versed throughout the evening due to tachypnea & tachycardia. Called bedside after patient received PRN doses without effect. Patient tachycardic, tachypneic, hypertensive and hypoxic in the 80's.  - FiO2 increased to 100% - STAT CXR ordered - versed 4 mg IVP x 1 - followed by propofol drip, plan to keep propofol as low dose as tolerated - f/u ABG if unable to wean FiO2  Will continue to monitor patient closely   Domingo Pulse [place], AGACNP-BC Acute Care Nurse Practitioner [place]   [phone] / [phone] Please see Amion for pager details.

## 2020-07-19 LAB — BASIC METABOLIC PANEL
Anion gap: 3 — ABNORMAL LOW (ref 5–15)
BUN: 17 mg/dL (ref 8–23)
CO2: 29 mmol/L (ref 22–32)
Calcium: 7.5 mg/dL — ABNORMAL LOW (ref 8.9–10.3)
Chloride: 114 mmol/L — ABNORMAL HIGH (ref 98–111)
Creatinine, Ser: 0.61 mg/dL (ref 0.44–1.00)
GFR, Estimated: >60 mL/min (ref >60)
Glucose, Bld: 153 mg/dL — ABNORMAL HIGH (ref 70–99)
Potassium: 3.4 mmol/L — ABNORMAL LOW (ref 3.5–5.1)
Sodium: 146 mmol/L — ABNORMAL HIGH (ref 135–145)

## 2020-07-19 LAB — CBC
HCT: 26.9 % — ABNORMAL LOW (ref 36.0–46.0)
Hemoglobin: 8.4 g/dL — ABNORMAL LOW (ref 12.0–15.0)
MCH: 31.3 pg (ref 26.0–34.0)
MCHC: 31.2 g/dL (ref 30.0–36.0)
MCV: 100.4 fL — ABNORMAL HIGH (ref 80.0–100.0)
Platelets: 394 10*3/uL (ref 150–400)
RBC: 2.68 MIL/uL — ABNORMAL LOW (ref 3.87–5.11)
RDW: 17.2 % — ABNORMAL HIGH (ref 11.5–15.5)
WBC: 16.5 10*3/uL — ABNORMAL HIGH (ref 4.0–10.5)
nRBC: 0 % (ref 0.0–0.2)

## 2020-07-19 LAB — CULTURE, BAL-QUANTITATIVE W GRAM STAIN: Culture: 50000 — AB

## 2020-07-19 LAB — GLUCOSE, CAPILLARY
Glucose-Capillary: 98 mg/dL (ref 70–99)
Glucose-Capillary: 115 mg/dL — ABNORMAL HIGH (ref 70–99)
Glucose-Capillary: 110 mg/dL — ABNORMAL HIGH (ref 70–99)
Glucose-Capillary: 99 mg/dL (ref 70–99)
Glucose-Capillary: 97 mg/dL (ref 70–99)
Glucose-Capillary: 108 mg/dL — ABNORMAL HIGH (ref 70–99)

## 2020-07-19 LAB — TRIGLYCERIDES: Triglycerides: 127 mg/dL (ref <150)

## 2020-07-19 LAB — PROCALCITONIN: Procalcitonin: 0.52 ng/mL

## 2020-07-19 LAB — MAGNESIUM: Magnesium: 1.8 mg/dL (ref 1.7–2.4)

## 2020-07-19 LAB — PHOSPHORUS: Phosphorus: 2.8 mg/dL (ref 2.5–4.6)

## 2020-07-19 MED ORDER — POTASSIUM CHLORIDE 20 MEQ PO PACK
40.0000 meq | PACK | Freq: Once | ORAL | Status: AC
  Administered 2020-07-19: 40 meq
  Filled 2020-07-19: qty 2

## 2020-07-19 MED ORDER — SODIUM CHLORIDE 0.9 % IV SOLN
2.0000 g | Freq: Three times a day (TID) | INTRAVENOUS | Status: DC
  Administered 2020-07-19: 2 g via INTRAVENOUS
  Filled 2020-07-19 (×4): qty 2

## 2020-07-19 MED ORDER — LEVOFLOXACIN IN D5W 750 MG/150ML IV SOLN
750.0000 mg | INTRAVENOUS | Status: DC
  Administered 2020-07-19: 750 mg via INTRAVENOUS
  Filled 2020-07-19 (×2): qty 150

## 2020-07-19 MED ORDER — SODIUM CHLORIDE 0.9 % IV SOLN: 1.0000 g | Freq: Three times a day (TID) | INTRAVENOUS | Status: DC

## 2020-07-19 NOTE — Progress Notes (Signed)
   07/19/20 1200  Clinical Encounter Type  Visited With Patient  Visit Type Initial;Spiritual support;Social support  Referral From Nurse  Consult/Referral To Mona ministered outside PT's room with silent prayer and presence. Chaplain told staff to contact Chaplain when family has arrived, if needed.

## 2020-07-19 NOTE — Consult Note (Signed)
[facility] for Electrolyte Monitoring and Replacement   Recent Labs: Potassium (mmol/L)  Date Value  07/19/2020 3.4 (L)  04/12/2014 3.4 (L)   Magnesium (mg/dL)  Date Value  07/19/2020 1.8  04/12/2014 1.4 (L)   Calcium (mg/dL)  Date Value  07/19/2020 7.5 (L)   Calcium, Total (mg/dL)  Date Value  04/12/2014 7.8 (L)   Albumin (g/dL)  Date Value  07/11/2020 2.1 (L)  01/12/2017 4.8  04/12/2014 3.0 (L)   Phosphorus (mg/dL)  Date Value  07/19/2020 2.8   Sodium (mmol/L)  Date Value  07/19/2020 146 (H)  01/12/2017 130 (L)  04/12/2014 136    Assessment: 65 year old with history of COPD, HTN, presenting with AMS and acute on chronic respiratory failure. Pt was intubated on 6/23, extubated 6/27 and subsequently reintubated 7/1. Patient has been treated for pneumonia s/t MRSA and pseudomonas.  Pharmacy consulted to monitor and replenish electrolytes while in CCU.  Nutrition:  Osmolite @ 45 mL/hr  PROSource TF 45 mL BID  Free Water 200 mL q4h   Goal of Therapy: Electrolytes within normal limits  Plan: Potassium 40 mEq per tube x 1 Monitor electrolytes as ordered per team   Tawnya Crook, PharmD 07/19/20

## 2020-07-19 NOTE — Progress Notes (Signed)
 NAME:  Tamara Mcclure, MRN:  [id number], DOB:  10/10/1955, LOS: 17 ADMISSION DATE:  06/24/2020  CHIEF COMPLAINT:  follow up severe COPD and resp failure  BRIEF SYNOPSIS  64-year-old with history of COPD, depression, hypertension, panic attacks presenting with altered mental status, acute on chronic respiratory failure.  Patient had a fall a few days ago, seen at local urgent care.  Has been taking increasing doses of opiate.  In the ED she failed Narcan and BiPAP and got intubated.  PCCM consulted for admission, para pneumonic effusion s/p chest placement and removal, DX of MRSA and PSEUDOMNAL PNEUMONIA, complicated by delirium and catatonia Failed trial of extubation, re intubated and failure to wean from vent   Pertinent  Medical History     has a past medical history of Anxiety, Chronic lower back pain, COPD (chronic obstructive pulmonary disease) (HCC), Depression, Hypertension, Panic disorder with agoraphobia and severe panic attacks, and Peripheral sensory neuropathy.    Micro Data:  06/10/2020: SARS-CoV-2 and influenza PCR>> negative 06/26/2020: HIV>> nonreactive 06/15/2020: Strep pneumo urinary antigen>> negative 07/03/2020: Legionella urinary antigen>>negative 06/24/2020: Pleural fluid>> no growth 06/22/2020: Blood culture>> no growth 06/30/2020: MRSA PCR>> positive 06/23/2020: Tracheal aspirate>> Pseudomonas Aeruginosa & MRSA 06/20/2020: Urine>> no growth 07/10/2020: Stool culture (GI panel)>> no growth 07/10/2020: C. difficile PCR: Negative   Antimicrobials:  Cefepime 6/23>> 7/1 Ceftriaxone 6/24>> 6/25 Flagyl 6/23>> 6/27 Vancomycin 6/23>> 6/27; re-started 7/1>> Linezolid 6/27>>7/1 PO Vancomycin 6/30>>7/02 Ceftazidime 7/1>>   Significant Hospital Events: Including procedures, antibiotic start and stop dates in addition to other pertinent events  6/23-admit, intubated, right chest tube placed, right IJ CVC placed 6/24: Weaning vent settings and Levophed (down to 8 mcg). Na+  corrected to 124 this morning (108 on admission) ~ D5 @ 75 started. Tracheal aspirate with Gram+ cocci in pairs & rare gram + rods ~ Cefepime changed to Ceftriaxone, Azithromycin d/c.  Pleural fluid consistent with EXUDATE ~ pleural fluid culture pending 6/26 remains on vent, critically ill 6/27 Successfully extubated 6/28: Chest tube removed, remains with AMS, CT Head negative 6/29: Remains altered, concern for ? Catatonia vs. ? Osmotic Demyelination Syndrome (serum Na+ noted to have rapidly corrected during 1st 24 hrs of admission), Obtain MRI Brain,  Psych consulted 6/30: MRI Brain yesterday normal, tracking today (intermittently follows commands), obtain Neurology consult. WBC increased to 51 (23.6) ~ Peripheral Smear Normal, steroids d/c, consider Hematology consult; get CTA Chest and CT Abdomen/Pelvis 7/1: Overnight with hypoxia and respiratory distress, required intubation.  ABX changed to Ceftazidime and Vancomycin.  Hematology consulted for severe Leukocytosis.  EEG pending 7/2: Remains vent dependent, FiO2 down to 55%.  Chest x-ray improved with recruitment maneuvers 7/3: No ventilator asynchrony, heavily sedated, IV sedatives being titrated off as tolerated, not ready for SBT 7/4 severe resp failure, remains on vent 7/5 severe vent dyssynchrony, severe COPD, family made patient DNR 7/6 son askled to call [facility] for second opnion, No there recs, will not accept patient 07/17/20- patient with SBT today and was initially doing ok but hen failed trial after 2 hours.  I spoke with son Mr Cerrato and updated on care plan.  07/18/20- patient had significant decline overnight.  I met with son to review patietns condition and care plan.  07/19/20- patient with many GNR in resp culture - initiated fortaz tid. Plan for goals of care discussion with family in am for possible comfort measures due to prolonged.        Antimicrobials:   Antibiotics Given (last   72 hours)     None       Interim  History / Subjective:  Remains on vent Severe resp distress +COPD   Objective   Blood pressure 139/84, pulse (!) 104, temperature 99.86 F (37.7 C), resp. rate (!) 34, height 5' 5" (1.651 m), weight 73.4 kg, SpO2 98 %.    Vent Mode: PRVC FiO2 (%):  [40 %-50 %] 40 % Set Rate:  [16 bmp] 16 bmp Vt Set:  [400 mL] 400 mL PEEP:  [8 cmH20] 8 cmH20 Plateau Pressure:  [22 cmH20-30 cmH20] 22 cmH20   Intake/Output Summary (Last 24 hours) at 07/19/2020 1024 Last data filed at 07/19/2020 0700 Gross per 24 hour  Intake 2586.41 ml  Output 1450 ml  Net 1136.41 ml    Filed Weights   07/12/20 0500 07/16/20 0338 07/17/20 0404  Weight: 65.8 kg 72.1 kg 73.4 kg      REVIEW OF SYSTEMS  PATIENT IS UNABLE TO PROVIDE COMPLETE REVIEW OF SYSTEMS DUE TO SEVERE CRITICAL ILLNESS AND TOXIC METABOLIC ENCEPHALOPATHY   PHYSICAL EXAMINATION:  GENERAL:critically ill appearing, +resp distress HEAD: Normocephalic, atraumatic.  EYES: Pupils equal, round, reactive to light.  No scleral icterus.  MOUTH: Moist mucosal membrane. NECK: Supple. PULMONARY: +rhonchi, +wheezing CARDIOVASCULAR: S1 and S2. Regular rate and rhythm. No murmurs, rubs, or gallops.  GASTROINTESTINAL: Soft, nontender, -distended. Positive bowel sounds.  MUSCULOSKELETAL: No swelling, clubbing, or edema.  NEUROLOGIC: obtunded SKIN:intact,warm,dry   Labs/imaging that I havepersonally reviewed  (right click and "Reselect all SmartList Selections" daily)     ASSESSMENT AND PLAN    Admitted for Severe ACUTE Hypoxic and Hypercapnic Respiratory Failure Acute on chronic respiratory failure due to COPD exacerbation, right lower lobe pneumonia (Pseudomonas & MRSA), & large Right Pleural Effusion    -07/17/20- failed SBT, family updated    Severe ACUTE Hypoxic and Hypercapnic Respiratory Failure -continue Mechanical Ventilator support -continue Bronchodilator Therapy -Wean Fio2 and PEEP as tolerated -VAP/VENT bundle  implementation -will perform SAT/SBT when respiratory parameters are met    Vent Mode: PRVC FiO2 (%):  [40 %-50 %] 40 % Set Rate:  [16 bmp] 16 bmp Vt Set:  [400 mL] 400 mL PEEP:  [8 cmH20] 8 cmH20 Plateau Pressure:  [22 cmH20-30 cmH20] 22 cmH20   SEVERE COPD EXACERBATION -continue IV steroids as prescribed -continue NEB THERAPY as prescribed -morphine as needed -wean fio2 as needed and tolerated   SEVERE BENZO  WITHDRAWAL -Precedex as needed   CARDIAC ICU monitoring   ACUTE KIDNEY INJURY-resolved  -monitor UOP -minimize non-essential nephrotoxins Intake/Output Summary (Last 24 hours) at 07/19/2020 1024 Last data filed at 07/19/2020 0700 Gross per 24 hour  Intake 2586.41 ml  Output 1450 ml  Net 1136.41 ml    I/O last 3 completed shifts: In: 2586.4 [I.V.:963.4; NG/GT:1623] Out: 2050 [Urine:1950; Stool:100] No intake/output data recorded.    NEUROLOGY Acute toxic metabolic encephalopathy, need for sedation Goal RASS -2 to -3  Severe BENZO withdrawal Follow up NEURO RECS  SEPTIC SHOCK-resolved SOURCE-MRSA and PSEUDOMONAL pneumonia -use vasopressors to keep MAP>65 as needed  INFECTIOUS DISEASE -continue antibiotics as prescribed -follow up cultures   ENDO - ICU hypoglycemic\Hyperglycemia protocol -check FSBS per protocol   GI GI PROPHYLAXIS as indicated  NUTRITIONAL STATUS DIET-->TF's as tolerated Constipation protocol as indicated   ELECTROLYTES -follow labs as needed -replace as needed -pharmacy consultation and following      Best Practice (right click and "Reselect all SmartList Selections" daily)    Diet/type: Begin tube feeds 7/2 Pain/Anxiety/Delirium   protocol yes VAP protocol (if indicated): yes DVT prophylaxis: prophylactic heparin GI prophylaxis: PPI Glucose control:  yes, SSI Central venous access:  yes, and is still indicated Arterial line:  N/A Foley:  N/A Mobility:  bed rest  PT consulted: N/A Studies pending:  EEG Culture data pending: N/A Last reviewed culture data:today Antibiotics: Ceftazidime, Vancomycin, p.o. vancomycin DC'd 7/2 Flagyl DC'd 7/2 Antibiotic de-escalation:  N/A, on appropriate ABX Stop date: to be determined enthesis 14 days total) Daily labs: requested Code Status:  full code Disposition: remains critically ill, will stay in intensive care    Labs   CBC: Recent Labs  Lab 07/14/20 0554 07/15/20 0406 07/16/20 0330 07/17/20 0404 07/18/20 0451 07/19/20 0528  WBC 13.8* 12.0* 13.3* 11.0* 19.8* 16.5*  NEUTROABS 10.9* 9.0* 10.0* 7.9* 17.8*  --   HGB 8.2* 8.0* 8.9* 8.6* 9.5* 8.4*  HCT 24.9* 24.6* 28.1* 27.5* 31.0* 26.9*  MCV 96.9 98.8 99.3 102.2* 101.6* 100.4*  PLT 255 276 352 383 465* 394     Basic Metabolic Panel: Recent Labs  Lab 07/13/20 0532 07/14/20 0554 07/15/20 0406 07/16/20 0330 07/17/20 0404 07/18/20 0451 07/19/20 0528  NA 148*   < > 149* 151* 151* 148* 146*  K 3.6   < > 3.4* 3.8 3.8 3.6 3.4*  CL 120*   < > 123* 119* 120* 115* 114*  CO2 24   < > 25 25 28 28 29  GLUCOSE 152*   < > 125* 118* 125* 164* 153*  BUN 24*   < > 22 19 15 13 17  CREATININE 0.78   < > 0.64 0.63 0.54 0.60 0.61  CALCIUM 7.6*   < > 7.4* 7.9* 8.0* 7.9* 7.5*  MG 1.8  --  1.6* 2.0  --  1.9 1.8  PHOS 1.7*  --  1.7* 3.0  --  3.0 2.8   < > = values in this interval not displayed.    GFR: Estimated Creatinine Clearance: 71.3 mL/min (by C-G formula based on SCr of 0.61 mg/dL). Recent Labs  Lab 07/16/20 0330 07/17/20 0404 07/18/20 0451 07/19/20 0528  PROCALCITON  --   --  0.18 0.52  WBC 13.3* 11.0* 19.8* 16.5*     Liver Function Tests: No results for input(s): AST, ALT, ALKPHOS, BILITOT, PROT, ALBUMIN in the last 168 hours.  No results for input(s): LIPASE, AMYLASE in the last 168 hours. No results for input(s): AMMONIA in the last 168 hours.  ABG    Component Value Date/Time   PHART 7.42 07/18/2020 0800   PCO2ART 47 07/18/2020 0800   PO2ART 100 07/18/2020 0800    HCO3 30.5 (H) 07/18/2020 0800   ACIDBASEDEF 0.2 07/10/2020 0647   O2SAT 97.9 07/18/2020 0800      Coagulation Profile: No results for input(s): INR, PROTIME in the last 168 hours.  Cardiac Enzymes: No results for input(s): CKTOTAL, CKMB, CKMBINDEX, TROPONINI in the last 168 hours.  HbA1C: Hgb A1c MFr Bld  Date/Time Value Ref Range Status  07/08/2020 01:25 PM 5.8 (H) 4.8 - 5.6 % Final    Comment:    (NOTE)         Prediabetes: 5.7 - 6.4         Diabetes: >6.4         Glycemic control for adults with diabetes: <7.0   03/11/2013 12:00 AM 5.8 4.0 - 6.0 % Final    CBG: Recent Labs  Lab 07/18/20 1537 07/18/20 1926 07/18/20 2308 07/19/20 0302 07/19/20 0718  GLUCAP 97 101*   124* 98 115*     Allergies Allergies  Allergen Reactions   Penicillins Rash    Has patient had a PCN reaction causing immediate rash, facial/tongue/throat swelling, SOB or lightheadedness with hypotension: Yes Has patient had a PCN reaction causing severe rash involving mucus membranes or skin necrosis: No Has patient had a PCN reaction that required hospitalization: No Has patient had a PCN reaction occurring within the last 10 years: No If all of the above answers are "NO", then may proceed with Cephalosporin use.        DVT/GI PRX  assessed I Assessed the need for Labs I Assessed the need for Foley I Assessed the need for Central Venous Line Family Discussion when available I Assessed the need for Mobilization I made an Assessment of medications to be adjusted accordingly Safety Risk assessment completed  CASE DISCUSSED IN MULTIDISCIPLINARY ROUNDS WITH ICU TEAM     Critical Care Time devoted to patient care services described in this note is 33 minutes.  Critical care was necessary to treat or prevent imminent or life-threatening deterioration.     Patient with Multiorgan failure and at high risk for cardiac arrest and death.     , M.D.  Pulmonary & Critical Care  Medicine  [facility]       

## 2020-07-20 LAB — BASIC METABOLIC PANEL
Anion gap: 5 (ref 5–15)
BUN: 16 mg/dL (ref 8–23)
CO2: 28 mmol/L (ref 22–32)
Calcium: 7.5 mg/dL — ABNORMAL LOW (ref 8.9–10.3)
Chloride: 108 mmol/L (ref 98–111)
Creatinine, Ser: 0.48 mg/dL (ref 0.44–1.00)
GFR, Estimated: >60 mL/min (ref >60)
Glucose, Bld: 118 mg/dL — ABNORMAL HIGH (ref 70–99)
Potassium: 3.5 mmol/L (ref 3.5–5.1)
Sodium: 141 mmol/L (ref 135–145)

## 2020-07-20 LAB — MAGNESIUM: Magnesium: 1.9 mg/dL (ref 1.7–2.4)

## 2020-07-20 LAB — PROCALCITONIN: Procalcitonin: 0.16 ng/mL

## 2020-07-20 LAB — PHOSPHORUS: Phosphorus: 4.2 mg/dL (ref 2.5–4.6)

## 2020-07-20 LAB — GLUCOSE, CAPILLARY
Glucose-Capillary: 107 mg/dL — ABNORMAL HIGH (ref 70–99)
Glucose-Capillary: 116 mg/dL — ABNORMAL HIGH (ref 70–99)

## 2020-07-20 MED ORDER — HALOPERIDOL LACTATE 5 MG/ML IJ SOLN
0.5000 mg | INTRAMUSCULAR | Status: DC | PRN
Start: 2020-07-20 — End: 2020-07-21
  Filled 2020-07-20: qty 1

## 2020-07-20 MED ORDER — MORPHINE BOLUS VIA INFUSION
2.0000 mg | INTRAVENOUS | Status: DC | PRN
  Filled 2020-07-20: qty 2

## 2020-07-20 MED ORDER — GLYCOPYRROLATE 1 MG PO TABS
1.0000 mg | ORAL_TABLET | ORAL | Status: DC | PRN
  Filled 2020-07-20: qty 1

## 2020-07-20 MED ORDER — DIPHENHYDRAMINE HCL 50 MG/ML IJ SOLN: 12.5000 mg | INTRAMUSCULAR | Status: DC | PRN

## 2020-07-20 MED ORDER — GLYCOPYRROLATE 0.2 MG/ML IJ SOLN: 0.2000 mg | INTRAMUSCULAR | Status: DC | PRN

## 2020-07-20 MED ORDER — HALOPERIDOL 0.5 MG PO TABS
0.5000 mg | ORAL_TABLET | ORAL | Status: DC | PRN
  Filled 2020-07-20: qty 1

## 2020-07-20 MED ORDER — HALOPERIDOL LACTATE 2 MG/ML PO CONC
0.5000 mg | ORAL | Status: DC | PRN
  Filled 2020-07-20: qty 0.3

## 2020-07-20 MED ORDER — LORAZEPAM 2 MG/ML PO CONC: 1.0000 mg | ORAL | Status: DC | PRN

## 2020-07-20 MED ORDER — GLYCOPYRROLATE 0.2 MG/ML IJ SOLN
0.2000 mg | INTRAMUSCULAR | Status: DC | PRN
  Administered 2020-07-20 (×2): 0.2 mg via INTRAVENOUS
  Filled 2020-07-20 (×2): qty 1

## 2020-07-20 MED ORDER — POTASSIUM CHLORIDE 20 MEQ PO PACK: 40.0000 meq | PACK | Freq: Once | ORAL | Status: DC

## 2020-07-20 MED ORDER — LORAZEPAM 1 MG PO TABS: 1.0000 mg | ORAL_TABLET | ORAL | Status: DC | PRN

## 2020-07-20 MED ORDER — LORAZEPAM 2 MG/ML IJ SOLN
1.0000 mg | INTRAMUSCULAR | Status: DC | PRN
  Administered 2020-07-20: 1 mg via INTRAVENOUS
  Filled 2020-07-20: qty 1

## 2020-07-20 MED ORDER — MORPHINE 100MG IN NS 100ML (1MG/ML) PREMIX INFUSION
1.0000 mg/h | INTRAVENOUS | Status: DC
  Administered 2020-07-20: 22:00:00 6 mg/h via INTRAVENOUS
  Administered 2020-07-20: 4 mg/h via INTRAVENOUS
  Filled 2020-07-20 (×2): qty 100

## 2020-07-20 NOTE — Progress Notes (Signed)
Nutrition Brief Note  Chart reviewed. Pt now transitioning to comfort care.  No further nutrition interventions planned at this time.  Please re-consult as needed.   Koleen Distance MS, RD, LDN Please refer to [facility] for RD and/or RD on-call/weekend/after hours pager

## 2020-07-20 NOTE — Progress Notes (Signed)
NAME:  Tamara Mcclure, MRN:  [id number], DOB:  08-24-1955, LOS: 44 ADMISSION DATE:  06/11/2020  CHIEF COMPLAINT:  follow up severe COPD and resp failure  BRIEF SYNOPSIS  65 year old with history of COPD, depression, hypertension, panic attacks presenting with altered mental status, acute on chronic respiratory failure.  Patient had a fall a few days ago, seen at local urgent care.  Has been taking increasing doses of opiate.  In the ED she failed Narcan and BiPAP and got intubated.  PCCM consulted for admission, para pneumonic effusion s/p chest placement and removal, DX of MRSA and PSEUDOMNAL PNEUMONIA, complicated by delirium and catatonia Failed trial of extubation, re intubated and failure to wean from vent   Pertinent  Medical History     has a past medical history of Anxiety, Chronic lower back pain, COPD (chronic obstructive pulmonary disease) ([place]), Depression, Hypertension, Panic disorder with agoraphobia and severe panic attacks, and Peripheral sensory neuropathy.    Micro Data:  06/11/2020: SARS-CoV-2 and influenza PCR>> negative 06/12/2020: HIV>> nonreactive 06/19/2020: Strep pneumo urinary antigen>> negative 06/14/2020: Legionella urinary antigen>>negative 06/30/2020: Pleural fluid>> no growth 06/23/2020: Blood culture>> no growth 06/20/2020: MRSA PCR>> positive 06/29/2020: Tracheal aspirate>> Pseudomonas Aeruginosa & MRSA 06/15/2020: Urine>> no growth 07/10/2020: Stool culture (GI panel)>> no growth 07/10/2020: C. difficile PCR: Negative   Antimicrobials:  Cefepime 6/23>> 7/1 Ceftriaxone 6/24>> 6/25 Flagyl 6/23>> 6/27 Vancomycin 6/23>> 6/27; re-started 7/1>> Linezolid 6/27>>7/1 PO Vancomycin 6/30>>7/02 Ceftazidime 7/1>>   Significant Hospital Events: Including procedures, antibiotic start and stop dates in addition to other pertinent events  6/23-admit, intubated, right chest tube placed, right IJ CVC placed 6/24: Weaning vent settings and Levophed (down to 8 mcg). Na+  corrected to 124 this morning (108 on admission) ~ D5 @ 75 started. Tracheal aspirate with Gram+ cocci in pairs & rare gram + rods ~ Cefepime changed to Ceftriaxone, Azithromycin d/c.  Pleural fluid consistent with EXUDATE ~ pleural fluid culture pending 6/26 remains on vent, critically ill 6/27 Successfully extubated 6/28: Chest tube removed, remains with AMS, CT Head negative 6/29: Remains altered, concern for ? Catatonia vs. ? Osmotic Demyelination Syndrome (serum Na+ noted to have rapidly corrected during 1st 24 hrs of admission), Obtain MRI Brain,  Psych consulted 6/30: MRI Brain yesterday normal, tracking today (intermittently follows commands), obtain Neurology consult. WBC increased to 51 (23.6) ~ Peripheral Smear Normal, steroids d/c, consider Hematology consult; get CTA Chest and CT Abdomen/Pelvis 7/1: Overnight with hypoxia and respiratory distress, required intubation.  ABX changed to Ceftazidime and Vancomycin.  Hematology consulted for severe Leukocytosis.  EEG pending 7/2: Remains vent dependent, FiO2 down to 55%.  Chest x-ray improved with recruitment maneuvers 7/3: No ventilator asynchrony, heavily sedated, IV sedatives being titrated off as tolerated, not ready for SBT 7/4 severe resp failure, remains on vent 7/5 severe vent dyssynchrony, severe COPD, family made patient DNR 7/6 son askled to call [facility] for second opnion, No there recs, will not accept patient 07/17/20- patient with SBT today and was initially doing ok but hen failed trial after 2 hours.  I spoke with son Mr Newhard and updated on care plan.  07/18/20- patient had significant decline overnight.  I met with son to review patietns condition and care plan.  07/19/20- patient with many GNR in resp culture - initiated fortaz tid. Plan for goals of care discussion with family in am for possible comfort measures due to prolonged.  07/20/20-patient continues to have labored breathing on MV.  I have met  with family today and  POA son as well as other family members were present.  We discussed hospital course and patients overall condition.  Family wishes are to transition to comfort care only due to failure to wean off MV and ongoing severe multiple comorbid conditions.     Antimicrobials:   Antibiotics Given (last 72 hours)     Date/Time Action Medication Dose Rate   07/19/20 1453 New Bag/Given   cefTAZidime (FORTAZ) 2 g in sodium chloride 0.9 % 100 mL IVPB 2 g 200 mL/hr   07/19/20 1743 New Bag/Given   levofloxacin (LEVAQUIN) IVPB 750 mg 750 mg 100 mL/hr       Interim History / Subjective:  Remains on vent Severe resp distress +COPD   Objective   Blood pressure 132/85, pulse (!) 104, temperature 99.86 F (37.7 C), resp. rate (!) 30, height _0  (1.651 m), weight 72 kg, SpO2 97 %.    Vent Mode: PRVC FiO2 (%):  [30 %] 30 % Set Rate:  [16 bmp] 16 bmp Vt Set:  [400 mL] 400 mL PEEP:  [8 cmH20] 8 cmH20 Plateau Pressure:  [20 cmH20-23 cmH20] 23 cmH20   Intake/Output Summary (Last 24 hours) at 07/20/2020 0102 Last data filed at 07/20/2020 0700 Gross per 24 hour  Intake 2672.67 ml  Output 2000 ml  Net 672.67 ml    Filed Weights   07/16/20 0338 07/17/20 0404 07/20/20 0500  Weight: 72.1 kg 73.4 kg 72 kg      REVIEW OF SYSTEMS  PATIENT IS UNABLE TO PROVIDE COMPLETE REVIEW OF SYSTEMS DUE TO SEVERE CRITICAL ILLNESS AND TOXIC METABOLIC ENCEPHALOPATHY   PHYSICAL EXAMINATION:  GENERAL:critically ill appearing, +resp distress HEAD: Normocephalic, atraumatic.  EYES: Pupils equal, round, reactive to light.  No scleral icterus.  MOUTH: Moist mucosal membrane. NECK: Supple. PULMONARY: +rhonchi, +wheezing CARDIOVASCULAR: S1 and S2. Regular rate and rhythm. No murmurs, rubs, or gallops.  GASTROINTESTINAL: Soft, nontender, -distended. Positive bowel sounds.  MUSCULOSKELETAL: No swelling, clubbing, or edema.  NEUROLOGIC: obtunded SKIN:intact,warm,dry   Labs/imaging that I havepersonally reviewed   (right click and "Reselect all SmartList Selections" daily)     ASSESSMENT AND PLAN    Admitted for Severe ACUTE Hypoxic and Hypercapnic Respiratory Failure Acute on chronic respiratory failure due to COPD exacerbation, right lower lobe pneumonia (Pseudomonas & MRSA), & large Right Pleural Effusion    -07/17/20- failed SBT, family updated    Severe ACUTE Hypoxic and Hypercapnic Respiratory Failure -continue Mechanical Ventilator support -continue Bronchodilator Therapy -Wean Fio2 and PEEP as tolerated -VAP/VENT bundle implementation -will perform SAT/SBT when respiratory parameters are met    Vent Mode: PRVC FiO2 (%):  [30 %] 30 % Set Rate:  [16 bmp] 16 bmp Vt Set:  [400 mL] 400 mL PEEP:  [8 cmH20] 8 cmH20 Plateau Pressure:  [20 cmH20-23 cmH20] 23 cmH20   SEVERE COPD EXACERBATION -continue IV steroids as prescribed -continue NEB THERAPY as prescribed -morphine as needed -wean fio2 as needed and tolerated   SEVERE BENZO  WITHDRAWAL -Precedex as needed   CARDIAC ICU monitoring   ACUTE KIDNEY INJURY-resolved  -monitor UOP -minimize non-essential nephrotoxins Intake/Output Summary (Last 24 hours) at 07/20/2020 0821 Last data filed at 07/20/2020 0700 Gross per 24 hour  Intake 2672.67 ml  Output 2000 ml  Net 672.67 ml    I/O last 3 completed shifts: In: 7253 [P.O.:230; I.V.:974.2; NG/GT:2208.8; IV Piggyback:400] Out: 2800 [Urine:2700; Stool:100] No intake/output data recorded.    NEUROLOGY Acute toxic metabolic encephalopathy, need for sedation Goal  RASS -2 to -3  Severe BENZO withdrawal Follow up [place] and PSEUDOMONAL pneumonia -use vasopressors to keep MAP>65 as needed  INFECTIOUS DISEASE -continue antibiotics as prescribed -follow up cultures   ENDO - ICU hypoglycemic\Hyperglycemia protocol -check FSBS per protocol   GI GI PROPHYLAXIS as indicated  NUTRITIONAL STATUS DIET-->TF's as  tolerated Constipation protocol as indicated   ELECTROLYTES -follow labs as needed -replace as needed -pharmacy consultation and following      Best Practice (right click and "Reselect all SmartList Selections" daily)    Diet/type: Begin tube feeds 7/2 Pain/Anxiety/Delirium protocol yes VAP protocol (if indicated): yes DVT prophylaxis: prophylactic heparin GI prophylaxis: PPI Glucose control:  yes, SSI Central venous access:  yes, and is still indicated Arterial line:  N/A Foley:  N/A Mobility:  bed rest  PT consulted: N/A Studies pending: EEG Culture data pending: N/A Last reviewed culture data:today Antibiotics: Ceftazidime, Vancomycin, p.o. vancomycin DC'd 7/2 Flagyl DC'd 7/2 Antibiotic de-escalation:  N/A, on appropriate ABX Stop date: to be determined enthesis 14 days total) Daily labs: requested Code Status:  full code Disposition: remains critically ill, will stay in intensive care    Labs   CBC: Recent Labs  Lab 07/14/20 0554 07/15/20 0406 07/16/20 0330 07/17/20 0404 07/18/20 0451 07/19/20 0528  WBC 13.8* 12.0* 13.3* 11.0* 19.8* 16.5*  NEUTROABS 10.9* 9.0* 10.0* 7.9* 17.8*  --   HGB 8.2* 8.0* 8.9* 8.6* 9.5* 8.4*  HCT 24.9* 24.6* 28.1* 27.5* 31.0* 26.9*  MCV 96.9 98.8 99.3 102.2* 101.6* 100.4*  PLT 255 276 352 383 465* 394     Basic Metabolic Panel: Recent Labs  Lab 07/15/20 0406 07/16/20 0330 07/17/20 0404 07/18/20 0451 07/19/20 0528 07/20/20 0343  NA 149* 151* 151* 148* 146* 141  K 3.4* 3.8 3.8 3.6 3.4* 3.5  CL 123* 119* 120* 115* 114* 108  CO2 _0 GLUCOSE 125* 118* 125* 164* 153* 118*  BUN _1 CREATININE 0.64 0.63 0.54 0.60 0.61 0.48  CALCIUM 7.4* 7.9* 8.0* 7.9* 7.5* 7.5*  MG 1.6* 2.0  --  1.9 1.8 1.9  PHOS 1.7* 3.0  --  3.0 2.8 4.2    GFR: Estimated Creatinine Clearance: 70.7 mL/min (by C-G formula based on SCr of 0.48 mg/dL). Recent Labs  Lab 07/16/20 0330 07/17/20 0404 07/18/20 0451  07/19/20 0528 07/20/20 0343  PROCALCITON  --   --  0.18 0.52 0.16  WBC 13.3* 11.0* 19.8* 16.5*  --      Liver Function Tests: No results for input(s): AST, ALT, ALKPHOS, BILITOT, PROT, ALBUMIN in the last 168 hours.  No results for input(s): LIPASE, AMYLASE in the last 168 hours. No results for input(s): AMMONIA in the last 168 hours.  ABG    Component Value Date/Time   PHART 7.42 07/18/2020 0800   PCO2ART 47 07/18/2020 0800   PO2ART 100 07/18/2020 0800   HCO3 30.5 (H) 07/18/2020 0800   ACIDBASEDEF 0.2 07/10/2020 0647   O2SAT 97.9 07/18/2020 0800      Coagulation Profile: No results for input(s): INR, PROTIME in the last 168 hours.  Cardiac Enzymes: No results for input(s): CKTOTAL, CKMB, CKMBINDEX, TROPONINI in the last 168 hours.  HbA1C: Hgb A1c MFr Bld  Date/Time Value Ref Range Status  07/01/2020 01:25 PM 5.8 (H) 4.8 - 5.6 % Final    Comment:    (NOTE)         Prediabetes: 5.7 - 6.4  Diabetes: >6.4         Glycemic control for adults with diabetes: <7.0   03/11/2013 12:00 AM 5.8 4.0 - 6.0 % Final    CBG: Recent Labs  Lab 07/19/20 1727 07/19/20 1934 07/19/20 2313 07/20/20 0338 07/20/20 0748  GLUCAP 99 97 108* 107* 116*     Allergies Allergies  Allergen Reactions   Penicillins Rash    Has patient had a PCN reaction causing immediate rash, facial/tongue/throat swelling, SOB or lightheadedness with hypotension: Yes Has patient had a PCN reaction causing severe rash involving mucus membranes or skin necrosis: No Has patient had a PCN reaction that required hospitalization: No Has patient had a PCN reaction occurring within the last 10 years: No If all of the above answers are "NO", then may proceed with Cephalosporin use.        DVT/GI PRX  assessed I Assessed the need for Labs I Assessed the need for Foley I Assessed the need for Central Venous Line Family Discussion when available I Assessed the need for Mobilization I made an  Assessment of medications to be adjusted accordingly Safety Risk assessment completed  CASE DISCUSSED IN MULTIDISCIPLINARY ROUNDS WITH ICU TEAM     Critical Care Time devoted to patient care services described in this note is 109 minutes.  Critical care was necessary to treat or prevent imminent or life-threatening deterioration.     Patient with Multiorgan failure and at high risk for cardiac arrest and death.    Ottie Glazier, M.D.  Pulmonary & Cache

## 2020-07-20 NOTE — Consult Note (Signed)
[facility]   Recent Labs: Potassium (mmol/L)  Date Value  07/20/2020 3.5  04/12/2014 3.4 (L)   Magnesium (mg/dL)  Date Value  07/20/2020 1.9  04/12/2014 1.4 (L)   Calcium (mg/dL)  Date Value  07/20/2020 7.5 (L)   Calcium, Total (mg/dL)  Date Value  04/12/2014 7.8 (L)   Albumin (g/dL)  Date Value  07/11/2020 2.1 (L)  01/12/2017 4.8  04/12/2014 3.0 (L)   Phosphorus (mg/dL)  Date Value  07/20/2020 4.2   Sodium (mmol/L)  Date Value  07/20/2020 141  01/12/2017 130 (L)  04/12/2014 136    Assessment: 65 year old with history of COPD, HTN, presenting with AMS and acute on chronic respiratory failure. Pt was intubated on 6/23, extubated 6/27 and subsequently reintubated 7/1. Patient has been treated for pneumonia s/t MRSA and pseudomonas.  Pharmacy consulted to monitor and replenish electrolytes while in CCU.  Nutrition:  Osmolite @ 45 mL/hr  PROSource TF 45 mL BID  Free Water 200 mL q4h   Goal of Therapy: Electrolytes within normal limits  Plan: KCl 40 mEq per tube x 1 Monitor electrolytes as ordered per team   Benita Gutter 07/20/20

## 2020-07-20 NOTE — Progress Notes (Signed)
Chaplain Maggie made initial visit with patient and family members gathered at bedside. Room was made for storytelling, listening, and prayer. Continued support available per on call chaplain.

## 2020-07-21 LAB — CULTURE, RESPIRATORY W GRAM STAIN

## 2020-08-10 NOTE — Progress Notes (Signed)
Patient son came to visit after being notified of death.   Patient pronounced dead at 2:03am

## 2020-08-10 NOTE — Death Summary Note (Signed)
[facility] Death Summary   Patient: Tamara Mcclure CWC:[id number]   PCP: Roselee Nova, MD DOB: 1955/05/18    Admit Date:  07/07/20  Date of Death: Date of Death: 07-26-20  Time of Death: Time of Death: 0203  Length of Stay: [facility] Diagnoses:  Principle Cause of death Acute on chronic hypoxic and hypercapnic respiratory failure POA with acute metabolic encephalopathy   Principal Problem:   Subacute delirium Active Problems:   Panic disorder with agoraphobia and severe panic attacks   Chronic prescription opiate use   Severe sepsis with septic shock (HCC)   Acute respiratory failure (HCC)   Pleural effusion on right   Community acquired pneumonia   Leukocytosis   On mechanically assisted ventilation ([place])   Pseudomonas pneumonia ([place])   MRSA pneumonia ([place])   Acute kidney injury ([place])   Hyperkalemia   Anemia   Severe protein-calorie malnutrition (HCC)   Acute metabolic encephalopathy   Encephalopathy   Acute respiratory distress   History of present illness: As per the H and P dictated on admission, "65 year old with history of COPD, depression, hypertension, panic attacks presenting with altered mental status, acute on chronic respiratory failure.  Patient had a fall a few days ago, seen at local urgent care.  Has been taking increasing doses of opiate.  In the ED she failed Narcan and BiPAP and got intubated.  PCCM consulted for admission"  Hospital Course:  6/23-admit, intubated, right chest tube placed, right IJ CVC placed 6/24: Weaning vent settings and Levophed (down to 8 mcg). Na+ corrected to 124 (108 on admission). Tracheal aspirate with Gram+ cocci in pairs & rare gram + rods ~ Cefepime changed to Ceftriaxone, Azithromycin d/c.  Pleural fluid consistent with EXUDATE ~ pleural fluid culture pending 6/26 remains on vent, critically ill 6/27 Successfully extubated 6/28: Chest tube removed, remains with AMS, CT Head negative 6/29: Remains altered,  concern for ? Catatonia vs. ? Osmotic Demyelination Syndrome (serum Na+ noted to have rapidly corrected during 1st 24 hrs of admission), Obtain MRI Brain,  Psych consulted 6/30: MRI Brain yesterday normal, tracking today (intermittently follows commands), obtain Neurology consult. WBC increased to 51 (23.6) ~ Peripheral Smear Normal, steroids d/c, consider Hematology consult; get CTA Chest and CT Abdomen/Pelvis 7/1: Overnight with hypoxia and respiratory distress, required intubation.  ABX changed to Ceftazidime and Vancomycin.  Hematology consulted for severe Leukocytosis.  EEG pending 7/2: Remains vent dependent, FiO2 down to 55%.  Chest x-ray improved with recruitment maneuvers 7/3: No ventilator asynchrony, heavily sedated, IV sedatives being titrated off as tolerated, not ready for SBT 7/4 severe resp failure, remains on vent 7/5 severe vent dyssynchrony, severe COPD, family made patient DNR 7/6 son askled to call [facility] and [facility] for second opnion, No recs, will not accept patient 7/8 patient with SBT today and was initially doing ok but hen failed trial after 2 hours.  I spoke with son Mr Swier and updated on care plan. 07/18/20- patient had significant decline overnight.  I met with son to review patietns condition and care plan. 07/19/20- patient with many GNR in resp culture - initiated fortaz tid. Plan for goals of care discussion with family in am for possible comfort measures due to prolonged.  07/20/20-patient continues to have labored breathing on MV.  I have met with family today and POA son as well as other family members were present.  We discussed hospital course and patients overall condition.  Family wishes are to transition  to comfort care only due to failure to wean off MV and ongoing severe multiple comorbid conditions.  Acute on chronic respiratory failure due to COPD exacerbation, right lower lobe pneumonia (Pseudomonas & MRSA), & large Right Pleural Effusion Severe ACUTE on chronic  Hypoxic and Hypercapnic Respiratory Failure SEVERE COPD EXACERBATION    BENZO  WITHDRAWAL  Was on Precedex as needed  ACUTE KIDNEY INJURY-resolved  SEPTIC SHOCK-resolved SOURCE-MRSA and PSEUDOMONAL pneumonia Was on vasopressors to keep MAP>65 as needed  Family wished to transition to comfort care only due to failure to wean off MV and ongoing severe multiple comorbid conditions.  The patient was pronounced deceased at 02:03 AM, on 2020-07-27.  The results of significant diagnostics from this hospitalization (including imaging, microbiology, ancillary and laboratory) are listed below for reference.    Significant Diagnostic Studies: DG Chest 1 View  Result Date: 07/10/2020 CLINICAL DATA:  Intubated EXAM: CHEST  1 VIEW COMPARISON:  07/09/2020 FINDINGS: Endotracheal tube is 2 cm above the carina. Right central line is unchanged. NG tube enters the stomach. Heart is normal size. Left lower lobe atelectasis, progressing since prior study. Right lung clear. No effusions. No acute bony abnormality. IMPRESSION: Endotracheal tube 2 cm above the carina. Worsening left lower lobe atelectasis. Electronically Signed   By: Rolm Baptise M.D.   On: 07/10/2020 01:02   DG Chest 1 View  Result Date: 07/08/2020 CLINICAL DATA:  Dyspnea EXAM: CHEST  1 VIEW COMPARISON:  07/08/2020, 07/06/2020, 07/05/2020 FINDINGS: Right-sided central venous catheter tip over the SVC. Airspace disease at the left lung base. Stable cardiomediastinal silhouette. No pneumothorax. IMPRESSION: Increased airspace disease at left base since radiograph earlier today, could be secondary to atelectasis or aspiration. Electronically Signed   By: Donavan Foil M.D.   On: 07/08/2020 21:00   DG Chest 1 View  Result Date: 07/06/2020 CLINICAL DATA:  Pleural effusion on the right EXAM: CHEST  1 VIEW COMPARISON:  Yesterday FINDINGS: Endotracheal tube with tip between the clavicular heads and carina. The enteric tube reaches the stomach. Right IJ  line with tip at the SVC. Chest tube on the right. No visible pneumothorax. Increased hazy density at both lung bases. Normal heart size. Notably severe left glenohumeral osteoarthritis. IMPRESSION: 1. Stable hardware positioning.  No visible pneumothorax. 2. Worsened aeration with increased opacity at the bases. Electronically Signed   By: Monte Fantasia M.D.   On: 07/06/2020 07:18   DG Abd 1 View  Result Date: 07/09/2020 CLINICAL DATA:  NG tube placement. EXAM: ABDOMEN - 1 VIEW COMPARISON:  06/14/2020. FINDINGS: Right IJ line noted with tip over SVC. NG tube is been advanced. NG tube noted with tip over the stomach. Stool noted throughout the colon. Mild bibasilar atelectasis. IMPRESSION: NG tube is been advanced. NG tube noted with tip over the distal stomach in good anatomic location. Electronically Signed   By: Marcello Moores  Register   On: 07/09/2020 12:57   DG Abd 1 View  Result Date: 07/06/2020 CLINICAL DATA:  65 year old female status post NG placement. EXAM: ABDOMEN - 1 VIEW COMPARISON:  Earlier radiograph dated 06/28/2020. FINDINGS: Partially visualized enteric tube with side port in the region of the GE junction. Recommend further advancing of the tube by additional 7 cm. A pigtail drainage catheter noted at the right lung base. Partially visualized small right pleural effusion. There is large stool within the colon. Similar appearance of bowel gas pattern. The osseous structures are intact. IMPRESSION: 1. Enteric tube with side port in the region of the  GE junction. Recommend further advancing of the tube by additional 7 cm. 2. Pigtail catheter with tip over the right lung base. Partially visualized small right pleural effusion. Electronically Signed   By: Anner Crete M.D.   On: 06/18/2020 18:42   DG Abdomen 1 View  Result Date: 06/22/2020 CLINICAL DATA:  Evaluate NG tube placement EXAM: ABDOMEN - 1 VIEW COMPARISON:  None FINDINGS: The tip of the NG tube is below the level of the GE junction  the gastric fundus. The side port is just above the GE junction. Gaseous distension of the stomach noted. Large stool burden identified within the colon. Right pleural effusion. IMPRESSION: The tip of the NG tube is in the gastric fundus with side port just above the GE junction. Consider advancing by approximately 4 cm such that the side port is situated below the level of the GE junction. Electronically Signed   By: Kerby Moors M.D.   On: 06/18/2020 12:06   CT HEAD WO CONTRAST  Result Date: 07/07/2020 CLINICAL DATA:  Anoxic brain damage EXAM: CT HEAD WITHOUT CONTRAST TECHNIQUE: Contiguous axial images were obtained from the base of the skull through the vertex without intravenous contrast. COMPARISON:  CT head 06/14/2020 FINDINGS: Brain: Ventricle size and cerebral volume normal. Negative for cerebral edema. No significant effacement of the sulci compared to the prior study. No acute hemorrhage or infarct or mass. Vascular: Atherosclerotic calcification in the carotid and vertebral arteries. Negative for hyperdense vessel Skull: Negative Sinuses/Orbits: Air-fluid level sphenoid sinus has enlarged since the prior study. Mucosal edema throughout the maxillary sinus and ethmoid sinuses bilaterally. Negative orbit Other: None IMPRESSION: No acute abnormality. No evidence of cerebral edema or infarction. No intracranial hemorrhage Electronically Signed   By: Franchot Gallo M.D.   On: 07/07/2020 11:25   CT Head Wo Contrast  Result Date: 06/18/2020 CLINICAL DATA:  Head trauma.  Fall several days ago. EXAM: CT HEAD WITHOUT CONTRAST CT CERVICAL SPINE WITHOUT CONTRAST TECHNIQUE: Multidetector CT imaging of the head and cervical spine was performed following the standard protocol without intravenous contrast. Multiplanar CT image reconstructions of the cervical spine were also generated. COMPARISON:  04/11/2014 FINDINGS: CT HEAD FINDINGS Brain: No evidence of acute infarction, hemorrhage, hydrocephalus,  extra-axial collection or mass lesion/mass effect. Vascular: No hyperdense vessel or unexpected calcification. Skull: Normal. Negative for fracture or focal lesion. Sinuses/Orbits: Opacification of the maxillary sinuses, ethmoid air cells noted. Sphenoid sinus mucosal thickening. Mastoid air cells are clear. Other: None CT CERVICAL SPINE FINDINGS Alignment: 4 mm anterolisthesis of L3 on L4 is identified, image 28/6. Skull base and vertebrae: No acute fracture. No primary bone lesion or focal pathologic process. Soft tissues and spinal canal: No prevertebral fluid or swelling. No visible canal hematoma. Disc levels: Marked multi level spondylosis identified throughout the cervical spine. Most advanced at C3-4, C4-5, C5-6 and C6-7. Bilateral facet arthropathy is also noted. Upper chest: Large right pleural effusion identified with opacification of the right upper lobe. Other: None IMPRESSION: 1. No acute intracranial abnormalities. 2. Chronic sinus inflammation. 3. No evidence for cervical spine fracture. 4. Advanced cervical spondylosis. 5. Large right pleural effusion with opacification of the right upper lobe. Electronically Signed   By: Kerby Moors M.D.   On: 06/18/2020 13:26   CT CHEST WO CONTRAST  Result Date: 07/07/2020 CLINICAL DATA:  65 year old female with respiratory failure. COPD. Recent right pneumonia, pleural effusion status post chest tube. EXAM: CT CHEST WITHOUT CONTRAST TECHNIQUE: Multidetector CT imaging of the chest was performed  following the standard protocol without IV contrast. COMPARISON:  Chest radiograph 07/06/2020 and earlier. Chest CT 07/03/2020. FINDINGS: Cardiovascular: Right IJ central line now in place terminating in the SVC. Calcified aortic atherosclerosis. Calcified coronary artery atherosclerosis. Vascular patency is not evaluated in the absence of IV contrast. No cardiomegaly or pericardial effusion. Mediastinum/Nodes: Enteric tube removed. Mediastinal lymph nodes appear  stable and within normal limits. Lungs/Pleura: Pigtail type right pleural drain now in place with drainage of the right pleural effusion seen on the prior CT. Trace residual fluid and/or pleural thickening in the right costophrenic angle. No pneumothorax. Upper lobe predominant centrilobular emphysema. Small volume retained secretions located dependently in the trachea and right side airways. No residual lung consolidation. Small calcified granuloma along the surface of the right diaphragm is chronic and stable since 2016. No areas of worsening ventilation since 06/28/2020. No left pleural effusion. Upper Abdomen: Negative visible noncontrast liver, spleen, pancreas, adrenal glands, left kidney, and bowel in the upper abdomen. Musculoskeletal: Osteopenia. Respiratory motion at the lower ribs. Advanced chronic left glenohumeral joint degeneration. Stable T7 and T8 compression fractures. No new osseous abnormality. IMPRESSION: 1. Largely resolved right pleural effusion with pleural drain in place. Mild or trace residual fluid and/or thickening in the right costophrenic angle. 2. Small volume retained secretions in the airways. No convincing pneumonia. Emphysema (ICD10-J43.9). 3. Aortic Atherosclerosis (ICD10-I70.0). Electronically Signed   By: Genevie Ann M.D.   On: 07/07/2020 11:29   CT Angio Chest Pulmonary Embolism (PE) W or WO Contrast  Result Date: 07/09/2020 CLINICAL DATA:  Tachypnea, hypoxia, fever. EXAM: CT ANGIOGRAPHY CHEST CT ABDOMEN AND PELVIS WITH CONTRAST TECHNIQUE: Multidetector CT imaging of the chest was performed using the standard protocol during bolus administration of intravenous contrast. Multiplanar CT image reconstructions and MIPs were obtained to evaluate the vascular anatomy. Multidetector CT imaging of the abdomen and pelvis was performed using the standard protocol during bolus administration of intravenous contrast. CONTRAST:  174m OMNIPAQUE IOHEXOL 350 MG/ML SOLN COMPARISON:  CT chest  dated 06/23/2020. FINDINGS: CTA CHEST FINDINGS Cardiovascular: Satisfactory opacification of the pulmonary arteries to the segmental level. No evidence of pulmonary embolism. Vascular calcifications are seen in the aortic arch. Normal heart size. No pericardial effusion. A right internal jugular central venous catheter terminates in the superior vena cava. Mediastinum/Nodes: No enlarged mediastinal, hilar, or axillary lymph nodes. Aspirated debris is seen in the trachea and bilateral lower lobe bronchi. Thyroid gland and esophagus demonstrate no significant findings. An enteric tube terminates in the stomach. Lungs/Pleura: There is atelectatic collapse of the left lower lobe. There is mild right basilar atelectasis. Moderate to severe centrilobular and paraseptal emphysema is noted. There is no pneumothorax or pleural effusion. Musculoskeletal: Anterior wedge deformities of T7 and T8 are unchanged since 06/28/2020. Review of the MIP images confirms the above findings. CT ABDOMEN and PELVIS FINDINGS Hepatobiliary: No focal liver abnormality is seen. No gallstones, gallbladder wall thickening, or biliary dilatation. Pancreas: Unremarkable. No pancreatic ductal dilatation or surrounding inflammatory changes. Spleen: Normal in size without focal abnormality. Adrenals/Urinary Tract: Adrenal glands are unremarkable. Kidneys are normal, without renal calculi, focal lesion, or hydronephrosis. Gas in the urinary bladder may represent recent instrumentation. Stomach/Bowel: Stomach is within normal limits. There is a large amount of stool in the rectum which may reflect impaction. There is edema located posterior to the rectum and anterior to the sacrum in the presacral space. Appendix appears normal. No evidence of bowel wall thickening, distention, or inflammatory changes. Vascular/Lymphatic: Aortic atherosclerosis. No enlarged abdominal  or pelvic lymph nodes. Reproductive: The bilateral adnexa are unremarkable. The uterus  is either atrophic or absent. Other: No abdominal wall hernia. Musculoskeletal: Degenerative changes are seen in the spine, most severe at L5-S1. Review of the MIP images confirms the above findings. IMPRESSION: 1.  No evidence of pulmonary embolism. 2. Aspirated debris in the trachea and bilateral lower lobe bronchi with atelectatic collapse of the left lower lobe. 3. Large stool burden in the rectum with edema in the presacral space may reflect proctitis. Aortic Atherosclerosis (ICD10-I70.0) and Emphysema (ICD10-J43.9). Electronically Signed   By: Zerita Boers M.D.   On: 07/09/2020 17:11   CT Cervical Spine Wo Contrast  Result Date: 07/01/2020 CLINICAL DATA:  Head trauma.  Fall several days ago. EXAM: CT HEAD WITHOUT CONTRAST CT CERVICAL SPINE WITHOUT CONTRAST TECHNIQUE: Multidetector CT imaging of the head and cervical spine was performed following the standard protocol without intravenous contrast. Multiplanar CT image reconstructions of the cervical spine were also generated. COMPARISON:  04/11/2014 FINDINGS: CT HEAD FINDINGS Brain: No evidence of acute infarction, hemorrhage, hydrocephalus, extra-axial collection or mass lesion/mass effect. Vascular: No hyperdense vessel or unexpected calcification. Skull: Normal. Negative for fracture or focal lesion. Sinuses/Orbits: Opacification of the maxillary sinuses, ethmoid air cells noted. Sphenoid sinus mucosal thickening. Mastoid air cells are clear. Other: None CT CERVICAL SPINE FINDINGS Alignment: 4 mm anterolisthesis of L3 on L4 is identified, image 28/6. Skull base and vertebrae: No acute fracture. No primary bone lesion or focal pathologic process. Soft tissues and spinal canal: No prevertebral fluid or swelling. No visible canal hematoma. Disc levels: Marked multi level spondylosis identified throughout the cervical spine. Most advanced at C3-4, C4-5, C5-6 and C6-7. Bilateral facet arthropathy is also noted. Upper chest: Large right pleural effusion  identified with opacification of the right upper lobe. Other: None IMPRESSION: 1. No acute intracranial abnormalities. 2. Chronic sinus inflammation. 3. No evidence for cervical spine fracture. 4. Advanced cervical spondylosis. 5. Large right pleural effusion with opacification of the right upper lobe. Electronically Signed   By: Kerby Moors M.D.   On: 06/22/2020 13:26   MR BRAIN WO CONTRAST  Result Date: 07/08/2020 CLINICAL DATA:  Mental status change. EXAM: MRI HEAD WITHOUT CONTRAST TECHNIQUE: Multiplanar, multiecho pulse sequences of the brain and surrounding structures were obtained without intravenous contrast. COMPARISON:  Head CT 07/07/2020 FINDINGS: Some sequences are moderately to severely motion degraded despite use of faster, more motion resistant imaging protocols and attempts at repeat imaging. Brain: There is no evidence of an acute infarct, intracranial hemorrhage, mass, midline shift, or extra-axial fluid collection. The ventricles and sulci are within normal limits for age. A subcentimeter T2 hyperintensity at the posterior aspect of the right lentiform nucleus may reflect a chronic lacunar infarct or dilated perivascular space. No significant white matter disease is evident elsewhere for age within limitations of motion. Vascular: Major intracranial vascular flow voids are preserved. Skull and upper cervical spine: No gross skull lesion. Sinuses/Orbits: Grossly unremarkable orbits. Subtotal opacification of the maxillary sinuses by combination of fluid and mucosal thickening. Moderate volume fluid in the left sphenoid sinus. Mild mucosal thickening in the frontal and ethmoid sinuses. Small left mastoid effusion. Other: 1.1 cm T2 hypointense nodule along the posterior margin of the right parotid tail. IMPRESSION: 1. No acute intracranial abnormality identified on this motion degraded study. 2. Sinusitis. 3. 1.1 cm nodule along the right parotid tail, indeterminate though may reflect a lymph  node or small parotid neoplasm. Electronically Signed   By: Zenia Resides  Jeralyn Ruths M.D.   On: 07/08/2020 14:19   CT ABDOMEN PELVIS W CONTRAST  Result Date: 07/09/2020 CLINICAL DATA:  Tachypnea, hypoxia, fever. EXAM: CT ANGIOGRAPHY CHEST CT ABDOMEN AND PELVIS WITH CONTRAST TECHNIQUE: Multidetector CT imaging of the chest was performed using the standard protocol during bolus administration of intravenous contrast. Multiplanar CT image reconstructions and MIPs were obtained to evaluate the vascular anatomy. Multidetector CT imaging of the abdomen and pelvis was performed using the standard protocol during bolus administration of intravenous contrast. CONTRAST:  165m OMNIPAQUE IOHEXOL 350 MG/ML SOLN COMPARISON:  CT chest dated 06/25/2020. FINDINGS: CTA CHEST FINDINGS Cardiovascular: Satisfactory opacification of the pulmonary arteries to the segmental level. No evidence of pulmonary embolism. Vascular calcifications are seen in the aortic arch. Normal heart size. No pericardial effusion. A right internal jugular central venous catheter terminates in the superior vena cava. Mediastinum/Nodes: No enlarged mediastinal, hilar, or axillary lymph nodes. Aspirated debris is seen in the trachea and bilateral lower lobe bronchi. Thyroid gland and esophagus demonstrate no significant findings. An enteric tube terminates in the stomach. Lungs/Pleura: There is atelectatic collapse of the left lower lobe. There is mild right basilar atelectasis. Moderate to severe centrilobular and paraseptal emphysema is noted. There is no pneumothorax or pleural effusion. Musculoskeletal: Anterior wedge deformities of T7 and T8 are unchanged since 06/10/2020. Review of the MIP images confirms the above findings. CT ABDOMEN and PELVIS FINDINGS Hepatobiliary: No focal liver abnormality is seen. No gallstones, gallbladder wall thickening, or biliary dilatation. Pancreas: Unremarkable. No pancreatic ductal dilatation or surrounding inflammatory changes.  Spleen: Normal in size without focal abnormality. Adrenals/Urinary Tract: Adrenal glands are unremarkable. Kidneys are normal, without renal calculi, focal lesion, or hydronephrosis. Gas in the urinary bladder may represent recent instrumentation. Stomach/Bowel: Stomach is within normal limits. There is a large amount of stool in the rectum which may reflect impaction. There is edema located posterior to the rectum and anterior to the sacrum in the presacral space. Appendix appears normal. No evidence of bowel wall thickening, distention, or inflammatory changes. Vascular/Lymphatic: Aortic atherosclerosis. No enlarged abdominal or pelvic lymph nodes. Reproductive: The bilateral adnexa are unremarkable. The uterus is either atrophic or absent. Other: No abdominal wall hernia. Musculoskeletal: Degenerative changes are seen in the spine, most severe at L5-S1. Review of the MIP images confirms the above findings. IMPRESSION: 1.  No evidence of pulmonary embolism. 2. Aspirated debris in the trachea and bilateral lower lobe bronchi with atelectatic collapse of the left lower lobe. 3. Large stool burden in the rectum with edema in the presacral space may reflect proctitis. Aortic Atherosclerosis (ICD10-I70.0) and Emphysema (ICD10-J43.9). Electronically Signed   By: TZerita BoersM.D.   On: 07/09/2020 17:11   CT CHEST ABDOMEN PELVIS W CONTRAST  Result Date: 06/24/2020 CLINICAL DATA:  Fall several days ago EXAM: CT CHEST, ABDOMEN, AND PELVIS WITH CONTRAST TECHNIQUE: Multidetector CT imaging of the chest, abdomen and pelvis was performed following the standard protocol during bolus administration of intravenous contrast. CONTRAST:  1088mOMNIPAQUE IOHEXOL 300 MG/ML  SOLN COMPARISON:  None. FINDINGS: CT CHEST FINDINGS Cardiovascular: Aortic atherosclerosis. Normal heart size. Three-vessel coronary artery calcifications. No pericardial effusion. Mediastinum/Nodes: No enlarged mediastinal, hilar, or axillary lymph nodes.  Thyroid gland, trachea, and esophagus demonstrate no significant findings. Lungs/Pleura: Moderate to severe centrilobular emphysema. Large right pleural effusion and associated atelectasis or consolidation. No displaced rib fracture or other evident etiology. Musculoskeletal: No chest wall mass. There are wedge deformities of the T7 and T8 vertebral bodies, proximally 30% anterior  height loss, which are new compared to most recent imaging of the chest dated 09/29/2014 but age indeterminate (series 7, image 138). CT ABDOMEN PELVIS FINDINGS Hepatobiliary: No solid liver abnormality is seen. No gallstones, gallbladder wall thickening, or biliary dilatation. Pancreas: Unremarkable. No pancreatic ductal dilatation or surrounding inflammatory changes. Spleen: Normal in size without significant abnormality. Adrenals/Urinary Tract: Adrenal glands are unremarkable. Kidneys are normal, without renal calculi, solid lesion, or hydronephrosis. Foley catheter in the urinary bladder. Stomach/Bowel: Stomach is within normal limits. Esophagogastric tube tip and side port below the diaphragm. Appendix appears normal. No evidence of bowel wall thickening, distention, or inflammatory changes. Large burden of stool throughout the colon. Vascular/Lymphatic: Aortic atherosclerosis. No enlarged abdominal or pelvic lymph nodes. Reproductive: No mass or other abnormality. Other: No abdominal wall hernia or abnormality. No abdominopelvic ascites. Musculoskeletal: No acute or significant osseous findings. IMPRESSION: 1. No definite CT evidence of acute traumatic injury to the chest, abdomen, or pelvis. 2. Large right pleural effusion and associated atelectasis or consolidation. No displaced rib fracture or other evident etiology. 3. There are wedge deformities of the T7 and T8 vertebral bodies, proximally 30% anterior height loss, which are new compared to most recent imaging of the chest dated 09/29/2014 but age indeterminate. Correlate for  acute point tenderness. 4. Emphysema. 5. Coronary artery disease. Aortic Atherosclerosis (ICD10-I70.0) and Emphysema (ICD10-J43.9). Electronically Signed   By: Eddie Candle M.D.   On: 06/22/2020 13:26   DG Chest Port 1 View  Result Date: 07/18/2020 CLINICAL DATA:  Hypoxia, check endotracheal tube EXAM: PORTABLE CHEST 1 VIEW COMPARISON:  07/14/2020 FINDINGS: Cardiac shadow is stable. Aortic calcifications are again seen. Right jugular central line, gastric catheter and endotracheal tube are noted in satisfactory position. The endotracheal tube is approximately 3.2 cm above the carina. Left retrocardiac density is again seen. Some improved aeration in the right base is noted. IMPRESSION: Tubes and lines as described above. Persistent retrocardiac opacity on the left is noted. Interval improved aeration in the right base is noted. Electronically Signed   By: Inez Catalina M.D.   On: 07/18/2020 03:57   DG Chest Port 1 View  Result Date: 07/14/2020 CLINICAL DATA:  Acute respiratory failure with hypoxia EXAM: PORTABLE CHEST 1 VIEW COMPARISON:  07/11/2020 FINDINGS: Endotracheal tube terminates 3.5 cm above the carina. Mild patchy opacities in the lungs bilaterally, lower lobe predominant. This is more conspicuous than on the prior, suggesting mild infection/pneumonia, less likely atelectasis. No pleural effusion or pneumothorax. The heart is normal in size. Enteric tube courses into the stomach. IMPRESSION: Mild patchy opacities in the bilateral lower lobes, favoring infection/pneumonia over atelectasis. Support apparatus as above. Electronically Signed   By: Julian Hy M.D.   On: 07/14/2020 02:24   DG Chest Port 1 View  Result Date: 07/11/2020 CLINICAL DATA:  Acute respiratory failure. EXAM: PORTABLE CHEST 1 VIEW COMPARISON:  07/10/2020 FINDINGS: 0316 hours. Endotracheal tube tip is 5 cm above the base of the carina. Right IJ central line remains in place. The NG tube passes into the stomach although the  distal tip position is not included on the film. No pneumothorax or pleural effusion. Basilar atelectasis again noted without substantial pulmonary edema. Telemetry leads overlie the chest. IMPRESSION: Stable exam. Bibasilar atelectasis. Electronically Signed   By: Misty Stanley M.D.   On: 07/11/2020 07:47   DG Chest Port 1 View  Result Date: 07/10/2020 CLINICAL DATA:  Respiratory distress. EXAM: PORTABLE CHEST 1 VIEW COMPARISON:  Radiograph earlier today, chest CT yesterday.  FINDINGS: Endotracheal tube tip is 6.4 cm from the carina. Enteric tube tip below the diaphragm in the stomach. Right internal jugular central venous catheter tip overlies the lower SVC. Improved aeration of the left lower lobe with likely re-expansion, mild residual atelectasis. Similar right lung base atelectasis. Stable heart size and mediastinal contours. No pneumothorax. Minimal blunting of the costophrenic angles may represent small effusions. IMPRESSION: 1. Improved aeration of the left lower lobe with likely re-expansion, mild residual atelectasis. Similar right lung base atelectasis. 2. Possible small pleural effusions. 3. Endotracheal tube tip is 6.4 cm from the carina. Enteric tube and right central line remain in place Electronically Signed   By: Keith Rake M.D.   On: 07/10/2020 15:55   DG Chest Port 1 View  Result Date: 07/09/2020 CLINICAL DATA:  Acute respiratory failure EXAM: PORTABLE CHEST 1 VIEW COMPARISON:  07/08/2020 FINDINGS: There is left lower lobe collapse with are lucency of the left hemithorax secondary to hyperinflation of the left upper lobe. Resultant left-sided volume loss. Coarsening of the interstitium within the lung apices is in keeping with moderate emphysema better appreciated on prior CT examination of 07/07/2020. No pneumothorax or pleural effusion. Cardiac size within normal limits. Right internal jugular central venous catheter noted within the superior vena cava. Degenerative changes are seen  within the left shoulder. IMPRESSION: Interval left lower lobe collapse. Moderate emphysema. Electronically Signed   By: Fidela Salisbury MD   On: 07/09/2020 04:51   DG Chest Port 1 View  Result Date: 07/08/2020 CLINICAL DATA:  Acute respiratory failure EXAM: PORTABLE CHEST 1 VIEW COMPARISON:  07/06/2020 FINDINGS: Endotracheal tube and nasogastric tube have been removed. Pulmonary insufflation is stable. Minimal left basilar atelectasis. Previously noted left pleural effusion has resolved. No pneumothorax or pleural effusion. Right internal jugular central venous catheter is unchanged with its tip at the superior vena cava. Cardiac size within normal limits. Pulmonary vascularity is normal. IMPRESSION: Interval extubation with stable pulmonary hypoinflation. Resolved left pleural effusion. Electronically Signed   By: Fidela Salisbury MD   On: 07/08/2020 03:56   DG Chest Port 1 View  Result Date: 07/05/2020 CLINICAL DATA:  Respiratory distress respiratory failure EXAM: PORTABLE CHEST 1 VIEW COMPARISON:  07/03/2020 FINDINGS: No significant change in AP portable chest radiograph, with right-sided pigtail chest tube. No pneumothorax or acute airspace opacity. Support apparatus including endotracheal tube, esophagogastric tube, and right neck vascular catheter in appropriate position. IMPRESSION: No significant change in AP portable chest radiograph, with right-sided pigtail chest tube. No pneumothorax or acute airspace opacity. Electronically Signed   By: Eddie Candle M.D.   On: 07/05/2020 11:01   DG Chest Port 1 View  Result Date: 07/03/2020 CLINICAL DATA:  Respiratory failure EXAM: PORTABLE CHEST 1 VIEW COMPARISON:  07/09/2020 FINDINGS: Endotracheal tube is seen 4.7 cm above the carina. Nasogastric tube tip overlies the proximal body of the stomach. Right internal jugular central venous catheter tip overlies the superior vena cava. Right basilar pigtail chest tube is unchanged. Pulmonary insufflation is  stable. Bibasilar pulmonary infiltrate has improved. Stable partial right lower lobe collapse. No pneumothorax or pleural effusion. Cardiac size within normal limits. Pulmonary vascularity is normal. IMPRESSION: Stable support lines and tubes. Improving bibasilar pulmonary infiltrate. Right chest tube in place.  No pneumothorax. Stable partial right lower lobe collapse. Electronically Signed   By: Fidela Salisbury MD   On: 07/03/2020 02:36   DG Chest Port 1 View  Result Date: 06/30/2020 CLINICAL DATA:  Chest tube NG tube EXAM: PORTABLE  CHEST 1 VIEW COMPARISON:  06/10/2020 FINDINGS: Endotracheal tube tip is about 6 cm superior to the carina. Esophageal tube tip overlies the proximal stomach, side-port in the region of the distal esophagus. Right IJ central venous catheter tip over the SVC. Interim placement of right-sided drainage catheter over the lower chest with decreased right pleural effusion. Small residual right pleural effusion without visible pneumothorax. Persistent airspace disease at the right middle lobe and right base. IMPRESSION: 1. Endotracheal tube tip about 6 cm superior to carina 2. Esophageal tube tip with side-port in the region of distal esophagus, consider further advancement for more optimal positioning 3. Interim placement of right-sided chest tube with decreased right pleural effusion. Small residual right pleural effusion with airspace disease in the right mid lung and consolidation at the right middle lobe and right base. Electronically Signed   By: Donavan Foil M.D.   On: 06/22/2020 18:45   DG Chest Portable 1 View  Result Date: 06/16/2020 CLINICAL DATA:  Status post intubation. EXAM: PORTABLE CHEST 1 VIEW COMPARISON:  09/29/2014 FINDINGS: ETT tip above the carina. There is an NG tube with tip below the GE junction. Moderate to large right pleural effusion. Atelectasis and airspace opacification of the right lower lobe and right middle lobes noted. Diffuse pulmonary vascular  congestion noted. The visualized osseous structures are unremarkable. IMPRESSION: 1. Support apparatus positioned as above. 2. Moderate to large right pleural effusion with associated atelectasis and airspace opacification of the right lower lobe and right middle lobes. 3. Pulmonary vascular congestion. Electronically Signed   By: Kerby Moors M.D.   On: 06/19/2020 11:03   EEG adult  Result Date: 07/10/2020 Lora Havens, MD     07/10/2020  4:25 PM Patient Name: Tamara Mcclure MRN: [id number] Epilepsy Attending: Lora Havens Referring Physician/Provider: Darel Hong, NP Date: 07/10/2020 Duration: 29.53 mins Patient history: 65 year old female with altered mental status.  EEG to evaluate for seizures. Level of alertness: comatose AEDs during EEG study: Versed, Xanax Technical aspects: This EEG study was done with scalp electrodes positioned according to the 10-20 International system of electrode placement. Electrical activity was acquired at a sampling rate of _0  and reviewed with a high frequency filter of _1  and a low frequency filter of _2 . EEG data were recorded continuously and digitally stored. Description: EEG showed continuous generalized 5 to 7 Hz theta as well as intermittent 2-_3  delta slowing. Hyperventilation and photic stimulation were not performed.   ABNORMALITY - Continuous slow, generalized IMPRESSION: This study is suggestive of moderate to severe diffuse encephalopathy, nonspecific etiology. No seizures or epileptiform discharges were seen throughout the recording. Lora Havens   ECHOCARDIOGRAM COMPLETE  Result Date: 06/10/2020    ECHOCARDIOGRAM REPORT   Patient Name:   Tamara Mcclure Date of Exam: 06/12/2020 Medical Rec #:  [id number]      Height:       65.0 in Accession #:    [id number]     Weight:       154.5 lb Date of Birth:  08-20-1955      BSA:          1.773 m Patient Age:    24 years       BP:           88/64 mmHg Patient Gender: F              HR:           107  bpm. Exam Location:  [facility] Procedure:  2D Echo, Color Doppler, Cardiac Doppler and Intracardiac            Opacification Agent Indications:     Acute respiratory distress R06.03  History:         Patient has no prior history of Echocardiogram examinations.                  COPD; Risk Factors:Hypertension. Panic disorder.  Sonographer:     Sherrie Sport RDCS (AE) Referring Phys:  [id number] [facility] Diagnosing Phys: Ida Rogue MD  Sonographer Comments: Echo performed with patient supine and on artificial respirator. IMPRESSIONS  1. Challenging images  2. Left ventricular ejection fraction, by estimation, is 60 to 65%. The left ventricle has normal function. The left ventricle has no regional wall motion abnormalities. Left ventricular diastolic parameters are consistent with Grade I diastolic dysfunction (impaired relaxation).  3. Right ventricular systolic function is normal. The right ventricular size is normal. There is normal pulmonary artery systolic pressure. The estimated right ventricular systolic pressure is 44.0 mmHg.  4. The mitral valve was not well visualized. No evidence of mitral valve regurgitation. No evidence of mitral stenosis. Moderate mitral annular calcification. FINDINGS  Left Ventricle: Left ventricular ejection fraction, by estimation, is 60 to 65%. The left ventricle has normal function. The left ventricle has no regional wall motion abnormalities. Definity contrast agent was given IV to delineate the left ventricular  endocardial borders. The left ventricular internal cavity size was normal in size. There is no left ventricular hypertrophy. Left ventricular diastolic parameters are consistent with Grade I diastolic dysfunction (impaired relaxation). Right Ventricle: The right ventricular size is normal. No increase in right ventricular wall thickness. Right ventricular systolic function is normal. There is normal pulmonary artery systolic pressure. The tricuspid regurgitant velocity is  2.23 m/s, and  with an assumed right atrial pressure of 5 mmHg, the estimated right ventricular systolic pressure is 34.7 mmHg. Left Atrium: Left atrial size was not well visualized. Right Atrium: Right atrial size was not well visualized. Pericardium: There is no evidence of pericardial effusion. Mitral Valve: The mitral valve was not well visualized. Moderate mitral annular calcification. No evidence of mitral valve regurgitation. No evidence of mitral valve stenosis. Tricuspid Valve: The tricuspid valve is not well visualized. Tricuspid valve regurgitation is mild . No evidence of tricuspid stenosis. Aortic Valve: The aortic valve was not well visualized. Aortic valve regurgitation is not visualized. No aortic stenosis is present. Aortic valve mean gradient measures 4.0 mmHg. Aortic valve peak gradient measures 6.7 mmHg. Aortic valve area, by VTI measures 2.74 cm. Pulmonic Valve: The pulmonic valve was normal in structure. Pulmonic valve regurgitation is not visualized. No evidence of pulmonic stenosis. Aorta: The aortic root is normal in size and structure. Venous: The pulmonary veins were not well visualized. The inferior vena cava is normal in size with greater than 50% respiratory variability, suggesting right atrial pressure of 3 mmHg. IAS/Shunts: No atrial level shunt detected by color flow Doppler.  LEFT VENTRICLE PLAX 2D LVIDd:         3.30 cm  Diastology LVIDs:         1.61 cm  LV e' medial:    4.03 cm/s LV PW:         1.66 cm  LV E/e' medial:  19.8 LV IVS:        0.88 cm  LV e' lateral:   4.03 cm/s LVOT diam:     2.00 cm  LV E/e' lateral: 19.8  LV SV:         81 LV SV Index:   46 LVOT Area:     3.14 cm  RIGHT VENTRICLE RV Basal diam:  3.35 cm RV S prime:     16.80 cm/s TAPSE (M-mode): 4.7 cm LEFT ATRIUM           Index       RIGHT ATRIUM           Index LA diam:      2.50 cm 1.41 cm/m  RA Area:     12.90 cm LA Vol (A2C): 40.0 ml 22.56 ml/m RA Volume:   29.90 ml  16.87 ml/m LA Vol (A4C): 63.6 ml  35.88 ml/m  AORTIC VALVE                   PULMONIC VALVE AV Area (Vmax):    2.84 cm    PV Vmax:        0.33 m/s AV Area (Vmean):   2.80 cm    PV Peak grad:   0.4 mmHg AV Area (VTI):     2.74 cm    RVOT Peak grad: 2 mmHg AV Vmax:           129.50 cm/s AV Vmean:          91.050 cm/s AV VTI:            0.298 m AV Peak Grad:      6.7 mmHg AV Mean Grad:      4.0 mmHg LVOT Vmax:         117.00 cm/s LVOT Vmean:        81.200 cm/s LVOT VTI:          0.259 m LVOT/AV VTI ratio: 0.87  AORTA Ao Root diam: 3.10 cm MITRAL VALVE                TRICUSPID VALVE MV Area (PHT): 2.62 cm     TR Peak grad:   19.9 mmHg MV Decel Time: 290 msec     TR Vmax:        223.00 cm/s MV E velocity: 79.70 cm/s MV A velocity: 107.00 cm/s  SHUNTS MV E/A ratio:  0.74         Systemic VTI:  0.26 m                             Systemic Diam: 2.00 cm Ida Rogue MD Electronically signed by Ida Rogue MD Signature Date/Time: 06/25/2020/5:21:34 PM    Final     Microbiology: Recent Results (from the past 240 hour(s))  Culture, BAL-quantitative w Gram Stain     Status: Abnormal   Collection Time: 07/16/20  9:42 AM   Specimen: Bronchoalveolar Lavage; Respiratory  Result Value Ref Range Status   Specimen Description   Final    BRONCHIAL ALVEOLAR LAVAGE Performed at [facility], [address] [place], [place] [address]    Special Requests   Final    Immunocompromised Performed at [facility], [address] [place], [place] [address]    Gram Stain   Final    ABUNDANT WBC PRESENT,BOTH PMN AND MONONUCLEAR RARE GRAM NEGATIVE RODS RARE YEAST Performed at [facility], [place] [address], [place], [place] [address]    Culture 50,000 COLONIES/mL STENOTROPHOMONAS MALTOPHILIA (A)  Final   Report Status 07/19/2020 FINAL  Final   Organism ID, Bacteria STENOTROPHOMONAS MALTOPHILIA (  A)  Final      Susceptibility   Stenotrophomonas maltophilia - MIC*    LEVOFLOXACIN 0.25 SENSITIVE Sensitive     TRIMETH/SULFA  <=20 SENSITIVE Sensitive     * 50,000 COLONIES/mL STENOTROPHOMONAS MALTOPHILIA  Culture, Respiratory w Gram Stain     Status: None   Collection Time: 07/18/20  5:55 AM   Specimen: Tracheal Aspirate; Respiratory  Result Value Ref Range Status   Specimen Description   Final    TRACHEAL ASPIRATE Performed at [facility], [address], [place], [place] [address]    Special Requests   Final    NONE Performed at [facility], [address] [place], [place] [address]    Gram Stain   Final    ABUNDANT WBC PRESENT, PREDOMINANTLY PMN FEW GRAM NEGATIVE RODS Performed at [facility], [place] [address] [place], [place] [address]    Culture   Final    MODERATE PSEUDOMONAS AERUGINOSA FEW STENOTROPHOMONAS MALTOPHILIA    Report Status Aug 20, 2020 FINAL  Final   Organism ID, Bacteria PSEUDOMONAS AERUGINOSA  Final   Organism ID, Bacteria STENOTROPHOMONAS MALTOPHILIA  Final      Susceptibility   Pseudomonas aeruginosa - MIC*    CEFTAZIDIME >=64 RESISTANT Resistant     CIPROFLOXACIN <=0.25 SENSITIVE Sensitive     GENTAMICIN <=1 SENSITIVE Sensitive     IMIPENEM 2 SENSITIVE Sensitive     * MODERATE PSEUDOMONAS AERUGINOSA   Stenotrophomonas maltophilia - MIC*    LEVOFLOXACIN 0.5 SENSITIVE Sensitive     TRIMETH/SULFA <=20 SENSITIVE Sensitive     * FEW STENOTROPHOMONAS MALTOPHILIA     Labs: CBC: Recent Labs  Lab 07/15/20 0406 07/16/20 0330 07/17/20 0404 07/18/20 0451 07/19/20 0528  WBC 12.0* 13.3* 11.0* 19.8* 16.5*  NEUTROABS 9.0* 10.0* 7.9* 17.8*  --   HGB 8.0* 8.9* 8.6* 9.5* 8.4*  HCT 24.6* 28.1* 27.5* 31.0* 26.9*  MCV 98.8 99.3 102.2* 101.6* 100.4*  PLT 276 352 383 465* 240   Basic Metabolic Panel: Recent Labs  Lab 07/15/20 0406 07/16/20 0330 07/17/20 0404 07/18/20 0451 07/19/20 0528 07/20/20 0343  NA 149* 151* 151* 148* 146* 141  K 3.4* 3.8 3.8 3.6 3.4* 3.5  CL 123* 119* 120* 115* 114* 108  CO2 _0 GLUCOSE 125* 118* 125* 164*  153* 118*  BUN _1 CREATININE 0.64 0.63 0.54 0.60 0.61 0.48  CALCIUM 7.4* 7.9* 8.0* 7.9* 7.5* 7.5*  MG 1.6* 2.0  --  1.9 1.8 1.9  PHOS 1.7* 3.0  --  3.0 2.8 4.2   Liver Function Tests: No results for input(s): AST, ALT, ALKPHOS, BILITOT, PROT, ALBUMIN in the last 168 hours. Cardiac Enzymes: No results for input(s): CKTOTAL, CKMB, CKMBINDEX, TROPONINI in the last 168 hours.  Time spent: 10 minutes  Signed:  Berle Mull  [facility]  August 20, 2020

## 2020-08-10 NOTE — Plan of Care (Signed)
Called into patient's room by nurse for 2nd verification of death. Pt died at 02:03.  Nurse may pronounce order is in.  Patient's son was notified and he will be coming to see patient.  On call MD and [facility] notified.  COPA was called and pt is a potential tissue/eye donor. Lines removed before pt's son came to the floor.

## 2020-08-10 DEATH — deceased
